# Patient Record
Sex: Female | Born: 1935 | Race: White | Hispanic: No | Smoking: Former smoker
Health system: Southern US, Community
[De-identification: ages and names within clinical notes are randomized; demographics above are authoritative.]

## PROBLEM LIST (undated history)

## (undated) DIAGNOSIS — H269 Unspecified cataract: Secondary | ICD-10-CM

## (undated) DIAGNOSIS — I4891 Unspecified atrial fibrillation: Secondary | ICD-10-CM

## (undated) DIAGNOSIS — E039 Hypothyroidism, unspecified: Secondary | ICD-10-CM

## (undated) DIAGNOSIS — J439 Emphysema, unspecified: Secondary | ICD-10-CM

## (undated) DIAGNOSIS — J449 Chronic obstructive pulmonary disease, unspecified: Secondary | ICD-10-CM

## (undated) DIAGNOSIS — M1990 Unspecified osteoarthritis, unspecified site: Secondary | ICD-10-CM

## (undated) DIAGNOSIS — K219 Gastro-esophageal reflux disease without esophagitis: Secondary | ICD-10-CM

## (undated) DIAGNOSIS — I10 Essential (primary) hypertension: Secondary | ICD-10-CM

## (undated) DIAGNOSIS — E119 Type 2 diabetes mellitus without complications: Secondary | ICD-10-CM

## (undated) DIAGNOSIS — N189 Chronic kidney disease, unspecified: Secondary | ICD-10-CM

## (undated) HISTORY — PX: CHOLECYSTECTOMY: SHX55

## (undated) HISTORY — PX: GALLBLADDER SURGERY: SHX652

---

## 1998-05-29 ENCOUNTER — Ambulatory Visit (HOSPITAL_COMMUNITY): Admission: RE | Admit: 1998-05-29 | Discharge: 1998-05-29 | Payer: Self-pay | Admitting: Cardiovascular Disease

## 2005-05-05 ENCOUNTER — Ambulatory Visit: Payer: Self-pay | Admitting: Unknown Physician Specialty

## 2005-09-13 ENCOUNTER — Ambulatory Visit: Payer: Self-pay | Admitting: Internal Medicine

## 2005-11-09 ENCOUNTER — Ambulatory Visit: Payer: Self-pay | Admitting: Gastroenterology

## 2006-10-04 ENCOUNTER — Ambulatory Visit: Payer: Self-pay | Admitting: Internal Medicine

## 2007-02-13 ENCOUNTER — Ambulatory Visit: Payer: Self-pay

## 2007-08-22 ENCOUNTER — Ambulatory Visit: Payer: Self-pay | Admitting: Gastroenterology

## 2007-11-14 ENCOUNTER — Ambulatory Visit: Payer: Self-pay | Admitting: Internal Medicine

## 2008-11-18 ENCOUNTER — Ambulatory Visit: Payer: Self-pay | Admitting: Internal Medicine

## 2009-09-24 ENCOUNTER — Emergency Department: Payer: Self-pay | Admitting: Emergency Medicine

## 2009-12-26 ENCOUNTER — Ambulatory Visit: Payer: Self-pay | Admitting: Internal Medicine

## 2010-03-29 ENCOUNTER — Ambulatory Visit: Payer: Self-pay | Admitting: Neurology

## 2010-04-27 ENCOUNTER — Ambulatory Visit: Payer: Self-pay | Admitting: Internal Medicine

## 2011-01-17 ENCOUNTER — Ambulatory Visit: Payer: Self-pay | Admitting: Rheumatology

## 2011-06-30 ENCOUNTER — Emergency Department: Payer: Self-pay | Admitting: *Deleted

## 2011-06-30 ENCOUNTER — Ambulatory Visit: Payer: Self-pay | Admitting: Internal Medicine

## 2011-07-31 ENCOUNTER — Emergency Department: Payer: Self-pay | Admitting: Emergency Medicine

## 2012-01-07 IMAGING — CR DG CHEST 1V
1 series · 1 of 1 positions shown · non-contrast
Comparison: none

REASON FOR EXAM: ABD PAIN
COMMENTS:   May transport without cardiac monitor

PROCEDURE:     DXR - DXR CHEST 1 VIEWAP OR PA  - July 31, 2011 [DATE]
RESULT:     The lungs are clear but hyperinflated. The heart and pulmonary
vessels are normal. The bony and mediastinal structures unremarkable. Mild
apical fibrosis is present bilaterally.

[view not recorded]
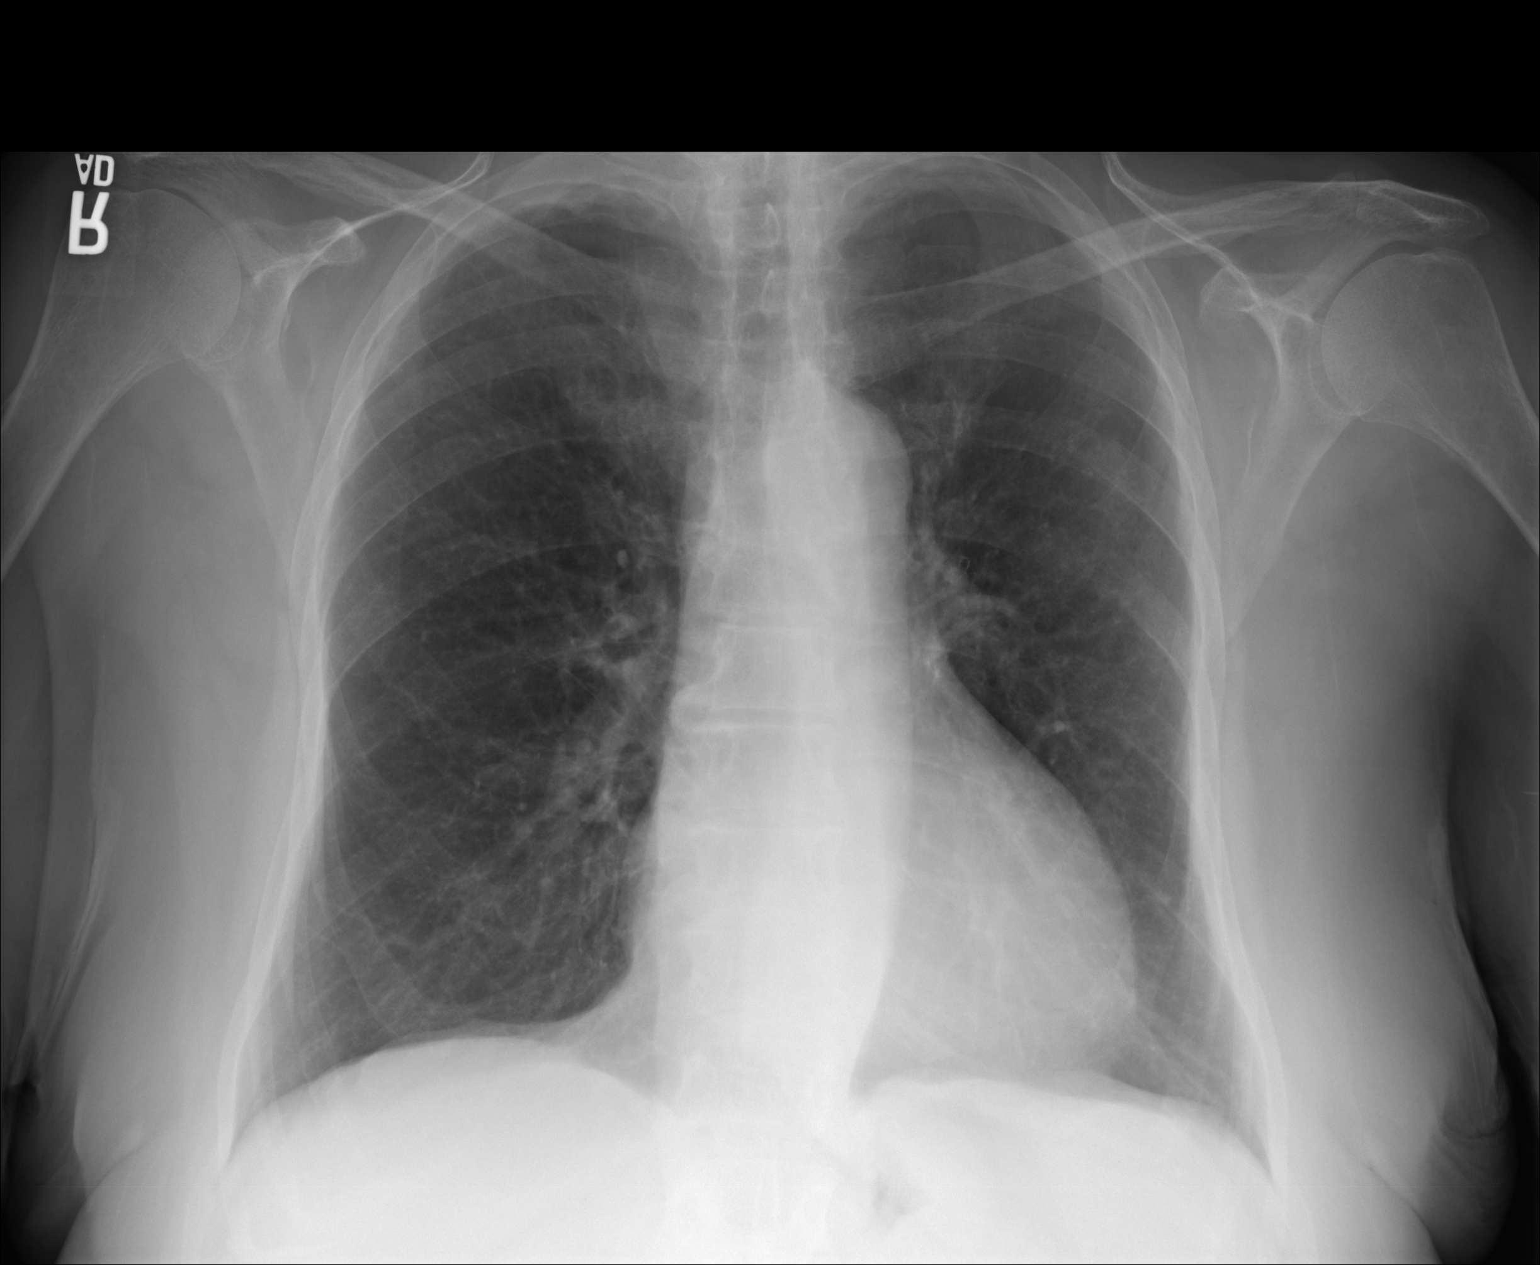

[1 of 1 positions shown; findings below may reference images not displayed]

IMPRESSION: COPD suggested.

## 2012-07-04 ENCOUNTER — Ambulatory Visit: Payer: Self-pay | Admitting: Internal Medicine

## 2013-05-06 ENCOUNTER — Ambulatory Visit: Payer: Self-pay | Admitting: Unknown Physician Specialty

## 2013-11-04 LAB — CBC WITH DIFFERENTIAL/PLATELET
Basophil #: 0 10*3/uL (ref 0.0–0.1)
Basophil %: 0.4 %
Eosinophil #: 0 10*3/uL (ref 0.0–0.7)
Eosinophil %: 0.4 %
HCT: 36.6 % (ref 35.0–47.0)
HGB: 12.2 g/dL (ref 12.0–16.0)
Lymphocyte #: 1.1 10*3/uL (ref 1.0–3.6)
Lymphocyte %: 16.2 %
MCH: 29.2 pg (ref 26.0–34.0)
MCHC: 33.4 g/dL (ref 32.0–36.0)
MCV: 88 fL (ref 80–100)
Monocyte #: 0.8 x10 3/mm (ref 0.2–0.9)
Monocyte %: 12.2 %
Neutrophil #: 4.9 10*3/uL (ref 1.4–6.5)
Neutrophil %: 70.8 %
Platelet: 191 10*3/uL (ref 150–440)
RBC: 4.18 10*6/uL (ref 3.80–5.20)
RDW: 14.4 % (ref 11.5–14.5)
WBC: 6.9 10*3/uL (ref 3.6–11.0)

## 2013-11-04 LAB — URINALYSIS, COMPLETE
Bacteria: NONE SEEN
Bilirubin,UR: NEGATIVE
Blood: NEGATIVE
Glucose,UR: NEGATIVE mg/dL (ref 0–75)
Hyaline Cast: 3
Ketone: NEGATIVE
Leukocyte Esterase: NEGATIVE
Nitrite: NEGATIVE
Ph: 5 (ref 4.5–8.0)
Protein: NEGATIVE
RBC,UR: <1 /HPF (ref 0–5)
Specific Gravity: 1.015 (ref 1.003–1.030)
Squamous Epithelial: 1
WBC UR: <1 /HPF (ref 0–5)

## 2013-11-04 LAB — TROPONIN I
Troponin-I: <0.02 ng/mL
Troponin-I: <0.02 ng/mL
Troponin-I: <0.02 ng/mL

## 2013-11-04 LAB — LIPASE, BLOOD: Lipase: 92 U/L (ref 73–393)

## 2013-11-04 LAB — COMPREHENSIVE METABOLIC PANEL
Albumin: 3.1 g/dL — ABNORMAL LOW (ref 3.4–5.0)
Alkaline Phosphatase: 110 U/L
Anion Gap: 6 — ABNORMAL LOW (ref 7–16)
BUN: 17 mg/dL (ref 7–18)
Bilirubin,Total: 1.3 mg/dL — ABNORMAL HIGH (ref 0.2–1.0)
Calcium, Total: 9 mg/dL (ref 8.5–10.1)
Chloride: 107 mmol/L (ref 98–107)
Co2: 25 mmol/L (ref 21–32)
Creatinine: 1.67 mg/dL — ABNORMAL HIGH (ref 0.60–1.30)
EGFR (African American): 33 — ABNORMAL LOW
EGFR (Non-African Amer.): 28 — ABNORMAL LOW
Glucose: 104 mg/dL — ABNORMAL HIGH (ref 65–99)
Osmolality: 278 (ref 275–301)
Potassium: 3.7 mmol/L (ref 3.5–5.1)
SGOT(AST): 192 U/L — ABNORMAL HIGH (ref 15–37)
SGPT (ALT): 139 U/L — ABNORMAL HIGH (ref 12–78)
Sodium: 138 mmol/L (ref 136–145)
Total Protein: 6.7 g/dL (ref 6.4–8.2)

## 2013-11-04 LAB — CK TOTAL AND CKMB (NOT AT ARMC)
CK, Total: 626 U/L — ABNORMAL HIGH (ref 21–215)
CK-MB: 1.9 ng/mL (ref 0.5–3.6)

## 2013-11-04 LAB — CK-MB
CK-MB: 2.4 ng/mL (ref 0.5–3.6)
CK-MB: 3 ng/mL (ref 0.5–3.6)

## 2013-11-04 LAB — PRO B NATRIURETIC PEPTIDE: B-Type Natriuretic Peptide: 1324 pg/mL — ABNORMAL HIGH (ref 0–450)

## 2013-11-05 ENCOUNTER — Inpatient Hospital Stay: Payer: Self-pay | Admitting: Internal Medicine

## 2013-11-05 LAB — CBC WITH DIFFERENTIAL/PLATELET
Basophil #: 0 10*3/uL (ref 0.0–0.1)
Basophil %: 0.1 %
Eosinophil #: 0 10*3/uL (ref 0.0–0.7)
Eosinophil %: 0 %
HCT: 34.5 % — ABNORMAL LOW (ref 35.0–47.0)
HGB: 11.5 g/dL — ABNORMAL LOW (ref 12.0–16.0)
Lymphocyte #: 0.5 10*3/uL — ABNORMAL LOW (ref 1.0–3.6)
Lymphocyte %: 8.2 %
MCH: 29.2 pg (ref 26.0–34.0)
MCHC: 33.4 g/dL (ref 32.0–36.0)
MCV: 87 fL (ref 80–100)
Monocyte #: 0.2 x10 3/mm (ref 0.2–0.9)
Monocyte %: 2.6 %
Neutrophil #: 5.7 10*3/uL (ref 1.4–6.5)
Neutrophil %: 89.1 %
Platelet: 186 10*3/uL (ref 150–440)
RBC: 3.95 10*6/uL (ref 3.80–5.20)
RDW: 14.2 % (ref 11.5–14.5)
WBC: 6.3 10*3/uL (ref 3.6–11.0)

## 2013-11-05 LAB — PROTIME-INR
INR: 1.1
Prothrombin Time: 14.5 secs (ref 11.5–14.7)

## 2013-11-05 LAB — COMPREHENSIVE METABOLIC PANEL
Albumin: 2.8 g/dL — ABNORMAL LOW (ref 3.4–5.0)
Alkaline Phosphatase: 108 U/L
Anion Gap: 7 (ref 7–16)
BUN: 33 mg/dL — ABNORMAL HIGH (ref 7–18)
Bilirubin,Total: 0.6 mg/dL (ref 0.2–1.0)
Calcium, Total: 9.1 mg/dL (ref 8.5–10.1)
Chloride: 102 mmol/L (ref 98–107)
Co2: 25 mmol/L (ref 21–32)
Creatinine: 2.35 mg/dL — ABNORMAL HIGH (ref 0.60–1.30)
EGFR (African American): 22 — ABNORMAL LOW
EGFR (Non-African Amer.): 19 — ABNORMAL LOW
Glucose: 204 mg/dL — ABNORMAL HIGH (ref 65–99)
Osmolality: 281 (ref 275–301)
Potassium: 3.7 mmol/L (ref 3.5–5.1)
SGOT(AST): 106 U/L — ABNORMAL HIGH (ref 15–37)
SGPT (ALT): 104 U/L — ABNORMAL HIGH (ref 12–78)
Sodium: 134 mmol/L — ABNORMAL LOW (ref 136–145)
Total Protein: 6.4 g/dL (ref 6.4–8.2)

## 2013-11-06 LAB — COMPREHENSIVE METABOLIC PANEL
Anion Gap: 8 (ref 7–16)
BUN: 50 mg/dL — ABNORMAL HIGH (ref 7–18)
Calcium, Total: 8.6 mg/dL (ref 8.5–10.1)
Chloride: 103 mmol/L (ref 98–107)
Co2: 23 mmol/L (ref 21–32)
Creatinine: 2.46 mg/dL — ABNORMAL HIGH (ref 0.60–1.30)
EGFR (African American): 21 — ABNORMAL LOW
EGFR (Non-African Amer.): 18 — ABNORMAL LOW
Glucose: 114 mg/dL — ABNORMAL HIGH (ref 65–99)
Osmolality: 282 (ref 275–301)
Potassium: 3.7 mmol/L (ref 3.5–5.1)
Sodium: 134 mmol/L — ABNORMAL LOW (ref 136–145)

## 2013-11-06 LAB — HEPATIC FUNCTION PANEL A (ARMC)
Albumin: 2.6 g/dL — ABNORMAL LOW (ref 3.4–5.0)
Alkaline Phosphatase: 102 U/L
Bilirubin, Direct: 0.2 mg/dL (ref 0.00–0.20)
Bilirubin,Total: 0.4 mg/dL (ref 0.2–1.0)
SGOT(AST): 56 U/L — ABNORMAL HIGH (ref 15–37)
SGPT (ALT): 74 U/L (ref 12–78)
Total Protein: 6 g/dL — ABNORMAL LOW (ref 6.4–8.2)

## 2013-11-06 LAB — AFP TUMOR MARKER: AFP-Tumor Marker: 3.1 ng/mL (ref 0.0–8.3)

## 2013-11-07 LAB — COMPREHENSIVE METABOLIC PANEL
Albumin: 2.4 g/dL — ABNORMAL LOW (ref 3.4–5.0)
Alkaline Phosphatase: 84 U/L
Anion Gap: 5 — ABNORMAL LOW (ref 7–16)
BUN: 44 mg/dL — ABNORMAL HIGH (ref 7–18)
Bilirubin,Total: 0.3 mg/dL (ref 0.2–1.0)
Calcium, Total: 8.4 mg/dL — ABNORMAL LOW (ref 8.5–10.1)
Chloride: 111 mmol/L — ABNORMAL HIGH (ref 98–107)
Co2: 25 mmol/L (ref 21–32)
Creatinine: 1.81 mg/dL — ABNORMAL HIGH (ref 0.60–1.30)
EGFR (African American): 30 — ABNORMAL LOW
EGFR (Non-African Amer.): 26 — ABNORMAL LOW
Glucose: 82 mg/dL (ref 65–99)
Osmolality: 292 (ref 275–301)
Potassium: 4 mmol/L (ref 3.5–5.1)
SGOT(AST): 46 U/L — ABNORMAL HIGH (ref 15–37)
SGPT (ALT): 59 U/L (ref 12–78)
Sodium: 141 mmol/L (ref 136–145)
Total Protein: 5.6 g/dL — ABNORMAL LOW (ref 6.4–8.2)

## 2013-11-09 LAB — CULTURE, BLOOD (SINGLE)

## 2015-02-14 NOTE — Consult Note (Signed)
Brief Consult Note: Diagnosis: transaminitis, abnormal biliary ducts on imaging.   Patient was seen by consultant.   Consult note dictated.   Orders entered.   Discussed with Attending MD.   Comments: Patient seen and evaluated. [place] and CT reviewed. labs reviewed. She has persistent transaminitis with mild nodular contour of the liver. She denies significant alcohol use and has no prior history of cirrhosis or hepatitis that she is aware of. She does report several years ago being told she had an "abnormal liver" but does not remember any of the details. She continues to have RUQ and LUQ abdominal pain, without nausea/vomiting/dysphagia/indigestion. Decreased appetite. She has been constipated recently and explains shes stopped responding to her outpatient laxative.  Abdominal pain may be related to constipation; agree with bowel regimen. colace and miralax. Biliary dilation may be secondary to post-cholecystectomy state vs underlying liver disease. no clear stone or mass on CT.  Check serial LFTs to monitor trend. Will order MRCP to eval biliary tree. Check hepatitis a/b/c, alpha-1-antitripsin levels, PT/INR, AFP to eval and work-up possible underlying cirrhosis. She may benefit from an outpatient EGD as well.  Due for outpatient colonoscopy for fhx of colon cancer and personal history of colon polyps. She has an appt scheduled already with Dr Vira Agar 12/08/13.  Will follow closely. full consult being dictated..  Electronic Signatures: Sherald Barge (PA-C)  (Signed 13-Jan-15 15:11)  Authored: Brief Consult Note   Last Updated: 13-Jan-15 15:11 by Sherald Barge (PA-C)

## 2015-02-14 NOTE — H&P (Signed)
PATIENT NAME:  Denise [name], Denise [name] MR#:  [id number] DATE OF BIRTH:  [birth year]  DATE OF ADMISSION:  11/04/2013  PRIMARY CARE PHYSICIAN:  Perrin Maltese, MD   HISTORY OF PRESENT ILLNESS:  The patient is an 81 year old Caucasian female with past medical history significant for history of COPD, emphysema, ongoing tobacco abuse, history of leaking valve, diabetes mellitus, hypertension, hyperlipidemia, gastroesophageal reflux disease, who presents to the hospital with complaints of not feeling well. Apparently, the patient has been having problems with cough as well as shortness of breath for the past 1 week. She is also admitting of some chest tightness. She was called Dr. Lamonte Sakai, who wanted the patient to have prednisone taper, however, the patient refused due to concerns of hypoglycemia. On arrival to the Emergency Room, however, she was noted to be relatively hypoxic with O2 sats of 90% on room air. She also was complaining of right upper quadrant as well as left upper quadrant abdominal pain. She stated that she has been not having good bowel movements for the past 3 days. In regards to right upper quadrant, she has been having this pain for the past few months. She has been also losing weight. She stated that she lost at least 70 pounds over the past 3 years. She states that she has been having problems with poor taste. She does not remember how long she has been having this right upper quadrant discomfort but in the Emergency Room she was noted to have elevated LFTs and ultrasound of her abdomen was performed. Ultrasound revealed intrahepatic ductal dilatation and hospitalist services were contacted for admission.   PAST MEDICAL HISTORY: Significant for history of COPD, emphysema, history of heart valves problems for leaking valves according to the patient, diabetes mellitus, COPD, no oxygen at home, hypertension, hyperlipidemia, smoker, still is 3 or 4 cigarettes a day, gastroesophageal reflux disease,  chronic renal failure with estimated GFR of 26 according to the patient.   MEDICATIONS: The patient is on:  1.  Atorvastatin 40 mg p.o. daily.  2.  Breo  Ellipta 100/25 one puff daily.  3.  Cheratussin a.c. 10/100 in 5 mL oral syrup 5 to 10 mL every 8 hours as needed.  4.  Clotrimazole topical cream 1% twice daily to affected area.  5.  Fenofibrate 145 mg p.o. daily.  6.  Iron sulfate 325 mg per day.  7.  Glimepiride  0.5 mg p.o. daily.  8.  Levothyroxine 125 mcg p.o. daily.  9.  Nystatin topical powder 1 application 3 times daily to affected area.  10.  Omeprazole 40 mg p.o. twice daily.  11.  ProAir HFA 2 puffs 4 times daily as needed.  12.  Losartan 80 mg per day.  13.  Verapamil extended-release 240 mg p.o. daily.   PAST SURGICAL HISTORY: Gallbladder surgery as well as D and C.   ALLERGIES: TESSALON PERLES AS WELL AS SOME MYCINS, SHE IS NOT SURE WHAT KIND.   FAMILY HISTORY: Multiple medical problems. Brother had colon cancer as well as prostate cancer and Parkinson's disease. Two sisters, 1 died of colon cancer and 1 died of MI. Patient's  other brother died of MI at a young age. The patient's mother died of diabetes complications. She had MI at the age of 39. The patient's father died of colon cancer and stomach cancer. The patient's son died of some kind of accident, apparently he had pancreatic abnormality found and he was supposed to have an operation, however, died prior to  operation.   SOCIAL HISTORY: The patient is widowed. She had 3 husbands total. She had 2 children, 1 son died as mentioned above because of some pancreatic abnormality. The patient's daughter lives in [place] but they are not very close. She is a smoker, the patient has been smoking for a long years now, she has cut down; however to 3 or 4 cigarettes a day. No alcohol abuse. She used to work in multiple companies in [employer], also with [employer] but now retired.   REVIEW OF SYSTEMS: Positive  for fatigue and weakness which seems to be chronic, pain in the abdomen and right upper quadrant as well as left upper quadrant, weight loss from 259 to 189 in 3 years, poor taste. Also small cataracts but she did not require operation. Some postnasal drip, cough as well as shortness of breath. Nagging achy pain in the right upper quadrant. Some intermittent chest pains whenever she coughs for a long period of time as well as feeling presyncopal whenever she coughs for a long period of time. Also admitting to palpitations in her chest and intermittent abdominal pains as mentioned above. Also constipation for the past 3 days, no bowel movement. Admits of having some neck pains as well as back pains and knee pains, more on the right side. Denies any high fevers or chills, weight gain.  EYES: Denies any blurry vision, double vision or glaucoma.  ENT: Denies any tinnitus, allergies, epistaxis, sinus pain, dentures, difficulty swallowing.  RESPIRATORY: Denies any wheezes, asthma, COPD.  CARDIOVASCULAR: Denies any orthopnea or edema.  GASTROINTESTINAL:  Denies any nausea, vomiting, diarrhea, rectal bleeding, change in bowel habits.  GENITOURINARY: Denies dysuria, hematuria, frequency, incontinence.  ENDOCRINE: Denies any polydipsia, nocturia, thyroid problems, heat or cold intolerance or thirst.  HEMATOLOGIC: Denies anemia, easy bruising, or swollen glands.  SKIN: Denies any acne, rashes or change in moles. MUSCULOSKELETAL: Denies arthritis, cramps, swelling. NEUROLOGIC:  Denies any numbness, epilepsy or tremor. PSYCHIATRIC: Denies anxiety, insomnia or depression.   PHYSICAL EXAMINATION: VITAL SIGNS: On arrival to the hospital, the patient's temperature is 97.8, pulse was 67, respiratory rate was 18, blood pressure 140/102, O2 sat was 90% on room air.  GENERAL: This is a well-developed, obese Caucasian female in mild distress whenever she coughs paroxysmally for prolonged period of time.  HEENT: Her  pupils are equal and reactive to light. Extraocular movements intact.  No icterus or conjunctivitis.  Has normal hearing. No pharyngeal erythema. Mucosa is dry.  NECK:  No masses, supple, nontender.  They not enlarged. No adenopathy.  No JVD or carotid bruits bilaterally. Full range of motion.  LUNGS: Clear ti auscultation but diminished breath sounds posteriorly, a few rhonchi as well as rales were heard anteriorly as well as a few wheezes. The patient has minimal wheezing; however, she has labored inspirations as well as increased effort to breathe whenever she moves around. She is in mild respiratory distress. She is in moderate respiratory distress whenever she coughs for a long period of time.  CARDIOVASCULAR: S1, S2 present, no murmurs, rubs or gallops heard.  Rhythm is regular.  PMI not lateralized.  Chest is nontender to palpation.  Pedal pulses 1+.  No lower extremity edema, calf tenderness, clubbing or cyanosis was noted.  ABDOMEN: Soft, minimally tender in the right upper quadrant but no rebound or guarding were noted. Bowel sounds are present but diminished.  MUSCULOSKELETAL:  Able to move all extremities. No cyanosis, degenerative joint disease or kyphosis. Gait is not  tested.  SKIN: No rashes, lesions, erythema, nodularity or induration. Candidal skin inflammation was noted under the pannus as well as under the breasts, seemed to be better healed under the pannus.  LYMPHATIC: No adenopathy in the cervical region.  NEUROLOGICAL: Cranial nerves grossly intact. Sensory is intact. No dysarthria or aphasia. The patient is alert and oriented to time, person, place, cooperative. Memory is good.  PSYCHIATRIC: No significant confusion, agitation or depression noted.   LABORATORY, DIAGNOSTIC AND RADIOLOGICAL DATA:  EKG showed normal sinus rhythm at 68 beats per minute, normal axis, no significant EKG changes were noted, nonspecific EKG changes only. BMP showed glucose was 104.  Beta-type natriuretic  peptide was 1324. Creatinine 1.67, bicarbonate level is 25, estimated GFR for non-African American would be 28. The patient's liver enzymes: Albumin level of 3.1, total bilirubin 1.3, AST as well as ALT is 192 and 139 respectively. CK elevation of 226, MB fraction 1.9 and troponin was less than 0.02. White blood cell count is normal at 6.9, hemoglobin was 12.2, platelet count 191, absolute neutrophil count is normal at 4.9. Urinalysis: Yellow clear urine, negative for glucose, bilirubin or ketones; specific gravity 1.015, pH was 5.0, negative for blood, protein, nitrites or leukocyte esterase; less than 1 red blood cell as well as white blood cell, no bacteria, 1 epithelial cell, mucus was present as well as 3 hyaline casts. Radiologic studies: Chest x-ray, PA and lateral, 12th of January 2015, showed stable hyperinflation suggesting emphysema, no acute cardiopulmonary findings were noted. Ultrasound of abdomen, limited survey, 12th of January 2015, revealed intrahepatic as well as common bile duct dilatation, mild common duct dilatation was demonstrated on a noncontrast CT scan in December 2010 but at that time patient had mp intrahepatic ductal dilatation. This could be related to prior cholecystectomy; however, due to elevation of hepatic function tests as well as presence of abdominal pain, it could indicate underlying pathology according to radiologist. Following CT scanning of abdomen and pelvis with contrast was recommended to evaluate visceral structures including pancreas.   ASSESSMENT AND PLAN: 1.  Chronic obstructive pulmonary disease exacerbation due to acute bronchitis. Admit the patient to the medical floor. Continue antibiotic therapy. I started her on Advair as well as DuoNebs and prednisone taper, Tussionex. Continue oxygen therapy as needed and get sputum cultures if possible.  2.  Left upper quadrant abdominal pain with weight loss, elevated LFTs as well as abnormal ultrasound of abdomen with  intrahepatic ductal dilatation.  Get a gastroenterologist involved, get CT scan of abdomen and pelvis with oral contrast only. Unfortunately, we cannot use IV contrast due to renal insufficiency. The patient may benefit from magnetic resonance cholangiopancreatography at some point if CT of abdomen and pelvis is not informative.  3.  Chronic renal failure, seems to be stable. We will continue her on Lasix.  4.  Hypertension. We will continue home medications. The patient's blood pressure seemed to be somewhat elevated but it could be stress-related, may need to be advancement, will follow blood pressure readings closely.  5.  Tobacco abuse. Discussed cessation for approximately 5 minutes. Nicotine replacement therapy will be initiated. The patient was agreeable.  6.  Constipation. Initiate Colace, Senna as well as MiraLax.   TIME SPENT:  One hour.     ____________________________ Theodoro Grist, MD rv:cs D: 11/04/2013 13:39:00 ET T: 11/04/2013 14:17:54 ET JOB#: [id number]  cc: Perrin Maltese, MD Theodoro Grist, MD, <Dictator>  [place] MD ELECTRONICALLY SIGNED 11/05/2013 19:10

## 2015-02-14 NOTE — Consult Note (Signed)
PATIENT NAME:  Denise [name], CRASS MR#:  [id number] DATE OF BIRTH:  November 08, 1929  DATE OF CONSULTATION:  11/05/2013  REFERRING PHYSICIAN: Theodoro Grist, MD CONSULTING PROVIDER:  Corky Sox. Zettie Pho, PA-C ATTENDING GASTROENTEROLOGIST: Arther Dames, MD  REASON FOR CONSULTATION: Dilated bile duct and elevated liver function tests.   HISTORY OF PRESENT ILLNESS: This is a pleasant 81 year old female who initially presented to the Emergency Department for just generally not feeling well who had been having some cough and shortness of breath. She does have a history of COPD with ongoing tobacco use, and she was found to be relatively hypoxic in the ER with a pulse ox of 90%. She also reported ongoing upper abdominal pain, mainly on the right side, for a few months now with some recent constipation.   Upon further work-up, she did undergo an abdominal ultrasound that noticed some abnormal biliary dilation and she did have some transaminitis. For this reason, she was admitted to the hospital. Transaminases initially were found to be AST 192, ALT 139. Bilirubin and alk phos  have remained normal.   Today these transaminases have trended down slightly to AST 106, ALT 104. Troponins were negative but BNP was markedly elevated. The patient did not have a white count.   Ultrasound of the abdomen reveals postcholecystectomy state but a dilated common bile duct with intrahepatic and extrahepatic dilation. There were also signs of hepatomegaly without any discrete hepatic mass.   Followup imaging was recommended and therefore she underwent a CT scan that revealed prominent intrahepatic and extrahepatic dilation but no evidence of a common bile duct stone or pancreatic mass. Liver measured at 17.8 cm which is borderline enlarged and there was a mild lobulation noted in the contour of the liver suggestive of possible early cirrhosis.   Of note, the patient's platelets are 186 and the patient does have a history of chronic  kidney disease. She denies any prior history of cirrhosis or hepatitis that she has been diagnosed with, but she does state that several years ago, someone once told her that she had a "abnormal liver." She cannot recall any of the details regarding this.   She reports for the past 3 to 4 days being constipated, having quite a difficult time passing stool. She does take a laxative at home but states that for the past 2 weeks she feels she has stopped responding to this. She does not have much of an appetite, but she denies any nausea, vomiting or dysphagia. Gradual weight loss over the past 3 years of about 60 to 70 pounds per the patient.   We have seen her in the office as an outpatient for screening colonoscopies. She has a personal history of colon polyps and multiple first-degree family members who have passed away of colon cancer. Her last colonoscopy was in 2006 and she was due for a repeat in 2011 but did not undergo this procedure due to some other health issues ongoing at that time.   She actually does have a follow-up appointment already scheduled with Dr. Vira Agar as an outpatient in February 2015 to readdress this issue.   Last EGD was in 1995.   No ongoing chest pain and shortness of breath since admission. No black or bloody stools. No other symptomatic concerns or complaints.   PAST MEDICAL HISTORY: COPD, chronic kidney disease, GERD, dyslipidemia, diabetes mellitus, hypertension, valvular heart disease, ongoing tobacco use, colon polyps.   HOME MEDICATIONS: Atorvastatin, Cheratussin, verapamil, nystatin, levothyroxine, losartan, phenyl fibrate, clotrimazole, iron sulfate,  glimepiride, ProAir, levothyroxine.   PAST SURGICAL HISTORY: Cholecystectomy and D and C.   ALLERGIES: TESSALON PERLES.   SOCIAL HISTORY: The patient does report ongoing tobacco use, but she has been trying to cut back, particularly because of her coexisting COPD. She denies any significant alcohol or illicit  drug use.   FAMILY HISTORY: Dad, brother, and sister have all had colon cancer. Brother with prostate cancer. Father with stomach cancer. She also reports multiple family members have had MIs.   REVIEW OF SYSTEMS: A 10-system review of systems was obtained on the patient. Pertinent positives are mentioned above and otherwise negative.   OBJECTIVE:  VITAL SIGNS: Blood pressure 131/66, heart rate 71, respirations 18, temperature 97.8, bedside pulse oximetry 93%.  GENERAL: A pleasant 81 year old female resting quietly and comfortably in bed in no acute distress. Alert and oriented x3.  HEAD: Atraumatic, normocephalic.  NECK: Supple. No lymphadenopathy noted.  HEENT: Sclerae anicteric. Mucous membranes moist.  LUNGS: Respirations are even and unlabored. Quiet bilateral wheezes are noted throughout.  CARDIAC: Regular rate and rhythm. S1, S2 noted.  ABDOMEN: Soft, nontender, nondistended. Normoactive bowel sounds noted in all four quadrants. No guarding or rebound. No masses, hernias or organomegaly appreciated but exam is significantly limited secondary to obese habitus. Negative Murphy sign.  RECTAL: Deferred.  PSYCHIATRIC: Appropriate mood and affect.  NEUROLOGIC: Cranial nerves II through XII are grossly intact.  EXTREMITIES: Negative for lower extremity edema, 2+ pulses noted in bilateral upper extremities.   LABORATORY DATA: White blood cells 6.3, hemoglobin 11.5, hematocrit 34.5, platelets 186. Sodium 134, potassium 3.7, BUN 33, creatinine 2.35, glucose 204, bilirubin 0.6, AST 106, ALT 104, alk phos 108. BNP is 1324. Lipase 92. Troponins negative. MCV 87.   IMAGING: Ultrasound of the abdomen was ordered on the patient showing a postcholecystectomy state. Common bile duct 20 mm. No evidence of hepatic mass. There is borderline hepatomegaly.   CT of the abdomen and pelvis without IV contrast but with oral contrast was obtained on the patient. This reveals prominent intrahepatic and  extrahepatic bile ducts when compared to a prior image. No evidence of a common bile duct stone or pancreatic mass. Cholecystectomy is noted. Liver measures 17.8 cm with minimally lobulated contour suggestive of possible early cirrhosis. There is also prominent stool in the rectosigmoid colon.   ASSESSMENT:  1.  Abnormal CT and ultrasound noting intrahepatic and extrahepatic biliary dilation. No clear common bile duct stone or pancreatic mass but there does seem to be a minimally lobulated  contour of the liver suggestive of possible early cirrhosis.  2.  Transaminitis. Since discharge, these levels have slightly improved to today but are still elevated over 100  3.  Right upper quadrant abdominal pain.  4.  Constipation.  5.  Personal history of colon polyps.  6.  Family history of colon cancer in brother, sister, and father.  7.  Chronic kidney disease.  8.  Chronic obstructive pulmonary disease with ongoing tobacco use.   PLAN: I have discussed this patient's case in detail with Dr. Arther Dames who is involved in the development of the patient's plan of care. Due to her persistent transaminitis with abnormal findings on ultrasound and CT scan, we do recommend checking serial liver enzymes to monitor the trend. We will also order an MRCP. Because there is some question of underlying cirrhosis, we would also like to check a hepatitis panel A, B, and C; PT/INR, alpha-1 antitrypsin, alpha-fetoprotein, to evaluate for possible underlying cirrhosis as well.  Certainly her abdominal pain could be in part contributing from her constipation, and therefore we do agree with continuing a bowel regimen with MiraLax and Colace.   She does have an outpatient followup appointment already scheduled with Dr. Vira Agar on 12/03/2013 to evaluate her need for a repeat colonoscopy due to a history of polyps and family history of colon cancer. We do recommend that she keep this appointment.   She may also end up being  a candidate for an outpatient esophagogastroduodenoscopy if her upper abdominal pain persists, or if there is truly a concern for cirrhosis, we can evaluate for varices.   This was explained to the patient and all questions were answered. We will continue to monitor this patient closely throughout hospitalization and make further recommendations pending above. All questions were answered.   Thank you so much for this consultation and for allowing [place] to participate in the patient's plan of care.   These services provided by Loren Racer, NP, under collaborative agreement with Dr. Arther Dames.    ____________________________ Corky Sox. Earle, PA-C kme:np D: 11/05/2013 14:38:07 ET T: 11/05/2013 15:00:30 ET JOB#: [id number]  cc: Corky Sox. Earle, PA-C, <Dictator> [place] PA ELECTRONICALLY SIGNED 11/05/2013 16:13

## 2015-04-22 ENCOUNTER — Other Ambulatory Visit: Payer: Self-pay | Admitting: Internal Medicine

## 2015-04-22 DIAGNOSIS — M545 Low back pain, unspecified: Secondary | ICD-10-CM

## 2015-04-22 DIAGNOSIS — G8929 Other chronic pain: Secondary | ICD-10-CM

## 2015-04-29 ENCOUNTER — Ambulatory Visit: Payer: Medicare Other

## 2015-05-05 ENCOUNTER — Ambulatory Visit: Payer: Medicare Other

## 2015-06-12 ENCOUNTER — Other Ambulatory Visit: Payer: Self-pay | Admitting: Internal Medicine

## 2015-06-12 DIAGNOSIS — M545 Low back pain, unspecified: Secondary | ICD-10-CM

## 2015-06-12 DIAGNOSIS — G8929 Other chronic pain: Secondary | ICD-10-CM

## 2015-07-21 ENCOUNTER — Emergency Department: Payer: Medicare Other

## 2015-07-21 ENCOUNTER — Emergency Department
Admission: EM | Admit: 2015-07-21 | Discharge: 2015-07-21 | Disposition: A | Discharge status: Home / Self Care | Payer: Medicare Other | Attending: Emergency Medicine | Admitting: Emergency Medicine

## 2015-07-21 DIAGNOSIS — M1610 Unilateral primary osteoarthritis, unspecified hip: Secondary | ICD-10-CM | POA: Diagnosis not present

## 2015-07-21 DIAGNOSIS — M25551 Pain in right hip: Secondary | ICD-10-CM | POA: Diagnosis present

## 2015-07-21 DIAGNOSIS — E119 Type 2 diabetes mellitus without complications: Secondary | ICD-10-CM | POA: Diagnosis not present

## 2015-07-21 DIAGNOSIS — M25552 Pain in left hip: Secondary | ICD-10-CM | POA: Diagnosis not present

## 2015-07-21 DIAGNOSIS — I10 Essential (primary) hypertension: Secondary | ICD-10-CM | POA: Diagnosis not present

## 2015-07-21 DIAGNOSIS — Z87891 Personal history of nicotine dependence: Secondary | ICD-10-CM | POA: Diagnosis not present

## 2015-07-21 HISTORY — DX: Type 2 diabetes mellitus without complications: E11.9

## 2015-07-21 HISTORY — DX: Essential (primary) hypertension: I10

## 2015-07-21 LAB — BASIC METABOLIC PANEL
Anion gap: 6 (ref 5–15)
BUN: 36 mg/dL — ABNORMAL HIGH (ref 6–20)
CO2: 22 mmol/L (ref 22–32)
Calcium: 8.6 mg/dL — ABNORMAL LOW (ref 8.9–10.3)
Chloride: 109 mmol/L (ref 101–111)
Creatinine, Ser: 2.57 mg/dL — ABNORMAL HIGH (ref 0.44–1.00)
GFR calc Af Amer: 19 mL/min — ABNORMAL LOW (ref >60)
GFR calc non Af Amer: 16 mL/min — ABNORMAL LOW (ref >60)
Glucose, Bld: 84 mg/dL (ref 65–99)
Potassium: 5 mmol/L (ref 3.5–5.1)
Sodium: 137 mmol/L (ref 135–145)

## 2015-07-21 LAB — CBC WITH DIFFERENTIAL/PLATELET
Basophils Absolute: 0 10*3/uL (ref 0–0.1)
Basophils Relative: 1 %
Eosinophils Absolute: 0.1 10*3/uL (ref 0–0.7)
Eosinophils Relative: 1 %
HCT: 31.7 % — ABNORMAL LOW (ref 35.0–47.0)
Hemoglobin: 10.5 g/dL — ABNORMAL LOW (ref 12.0–16.0)
Lymphocytes Relative: 28 %
Lymphs Abs: 1.6 10*3/uL (ref 1.0–3.6)
MCH: 29.3 pg (ref 26.0–34.0)
MCHC: 33 g/dL (ref 32.0–36.0)
MCV: 88.8 fL (ref 80.0–100.0)
Monocytes Absolute: 0.4 10*3/uL (ref 0.2–0.9)
Monocytes Relative: 7 %
Neutro Abs: 3.7 10*3/uL (ref 1.4–6.5)
Neutrophils Relative %: 63 %
Platelets: 160 10*3/uL (ref 150–440)
RBC: 3.57 MIL/uL — ABNORMAL LOW (ref 3.80–5.20)
RDW: 14.6 % — ABNORMAL HIGH (ref 11.5–14.5)
WBC: 5.9 10*3/uL (ref 3.6–11.0)

## 2015-07-21 MED ORDER — OXYCODONE-ACETAMINOPHEN 5-325 MG PO TABS
1.0000 | ORAL_TABLET | Freq: Once | ORAL | Status: AC
  Administered 2015-07-21: 1 via ORAL
  Filled 2015-07-21: qty 1

## 2015-07-21 MED ORDER — FENTANYL CITRATE (PF) 100 MCG/2ML IJ SOLN
50.0000 ug | Freq: Once | INTRAMUSCULAR | Status: AC
  Administered 2015-07-21: 50 ug via INTRAVENOUS
  Filled 2015-07-21: qty 2

## 2015-07-21 MED ORDER — TRAMADOL HCL 50 MG PO TABS: 50.0000 mg | ORAL_TABLET | Freq: Two times a day (BID) | ORAL | Status: DC | PRN

## 2015-07-21 NOTE — Discharge Instructions (Signed)
Arthritis, Nonspecific °Arthritis is pain, redness, warmth, or puffiness (inflammation) of a joint. The joint may be stiff or hurt when you move it. One or more joints may be affected. There are many types of arthritis. Your doctor may not know what type you have right away. The most common cause of arthritis is wear and tear on the joint (osteoarthritis). °HOME CARE  °· Only take medicine as told by your doctor. °· Rest the joint as much as possible. °· Raise (elevate) your joint if it is puffy. °· Use crutches if the painful joint is in your leg. °· Drink enough fluids to keep your pee (urine) clear or pale yellow. °· Follow your doctor's diet instructions. °· Use cold packs for very bad joint pain for 10 to 15 minutes every hour. Ask your doctor if it is okay for you to use hot packs. °· Exercise as told by your doctor. °· Take a warm shower if you have stiffness in the morning. °· Move your sore joints throughout the day. °GET HELP RIGHT AWAY IF:  °· You have a fever. °· You have very bad joint pain, puffiness, or redness. °· You have many joints that are painful and puffy. °· You are not getting better with treatment. °· You have very bad back pain or leg weakness. °· You cannot control when you poop (bowel movement) or pee (urinate). °· You do not feel better in 24 hours or are getting worse. °· You are having side effects from your medicine. °MAKE SURE YOU:  °· Understand these instructions. °· Will watch your condition. °· Will get help right away if you are not doing well or get worse. °Document Released: 01/04/2010 Document Revised: 04/10/2012 Document Reviewed: 01/04/2010 °[employer]® Patient Information ©2015 [employer]. This information is not intended to replace advice given to you by your health care provider. Make sure you discuss any questions you have with your health care provider. ° °

## 2015-07-21 NOTE — ED Notes (Signed)
Pt came in ED from doctors office. Pt fell over 1 week ago. Pt sts after 2 days, progressive pain started in right knee. Reports pain in low back.

## 2015-07-21 NOTE — ED Provider Notes (Addendum)
[facility] Emergency Department Provider Note  ____________________________________________  Time seen: Approximately 4:57 PM  I have reviewed the triage vital signs and the nursing notes.   HISTORY  Chief Complaint Hip Pain    HPI Denise [name] is a 81 y.o. female presents today because she has hip pain. Patient has a chronic problem with hip pain in both sides. She has had steroid shots for this. She states that the steroid shot" wore off" and now the pain is back. She states it hurts too much to walk. She has not fallen or hurt herself the last week and a half although we can half ago she did suffer a fall. She did not hit her head. She has no numbness and no weakness. Normally she gets around "pretty good" with a walker. She has been on pain medication for this before. She denies any fever chills nausea vomiting she denies any dysuria or urinary frequency. She does have chronic back pain as well. That does not appear to be active today. She has had no incontinence of bowel or bladder. She denies any numbness or weakness.  Past Medical History  Diagnosis Date  . Diabetes mellitus without complication   . Hypertension     There are no active problems to display for this patient.   Past Surgical History  Procedure Laterality Date  . Gallbladder surgery      Current Outpatient Rx  Name  Route  Sig  Dispense  Refill  . traMADol (ULTRAM) 50 MG tablet   Oral   Take 1 tablet (50 mg total) by mouth every 12 (twelve) hours as needed for severe pain.   12 tablet   0     Allergies Review of patient's allergies indicates no known allergies.  No family history on file.  Social History Social History  Substance Use Topics  . Smoking status: Former Research scientist (life sciences)  . Smokeless tobacco: None  . Alcohol Use: No    Review of Systems Constitutional: No fever/chills Eyes: No visual changes. ENT: No sore throat. No stiff neck no neck pain Cardiovascular:  Denies chest pain. Respiratory: Denies shortness of breath. Gastrointestinal:   no vomiting.  No diarrhea.  No constipation. Genitourinary: Negative for dysuria. Musculoskeletal: Negative lower extremity swelling Skin: Negative for rash. Neurological: Negative for headaches, focal weakness or numbness. 10-point ROS otherwise negative.  ____________________________________________   PHYSICAL EXAM:  VITAL SIGNS: ED Triage Vitals  Enc Vitals Group     BP 07/21/15 1312 163/65 mmHg     Pulse Rate 07/21/15 1312 57     Resp --      Temp 07/21/15 1312 97.8 F (36.6 C)     Temp Source 07/21/15 1312 Oral     SpO2 07/21/15 1312 100 %     Weight 07/21/15 1312 190 lb (86.183 kg)     Height 07/21/15 1312 5\' 6"  (1.676 m)     Head Cir --      Peak Flow --      Pain Score 07/21/15 1313 10     Pain Loc --      Pain Edu? --      Excl. in [place]? --     Constitutional: Alert and oriented. Well appearing and in no acute distress. Eyes: Conjunctivae are normal. PERRL. EOMI. Head: Atraumatic. Nose: No congestion/rhinnorhea. Mouth/Throat: Mucous membranes are moist.  Oropharynx non-erythematous. Neck: No stridor.   Nontender with no meningismus Cardiovascular: Normal rate, regular rhythm. Grossly normal heart sounds.  Good  peripheral circulation. Respiratory: Normal respiratory effort.  No retractions. Lungs CTAB. Gastrointestinal: Soft and nontender. No distention. No abdominal bruits. Back:  There is no focal tenderness or step off there is no midline tenderness there are no lesions noted. there is no CVA tenderness  Musculoskeletal: No lower extremity tenderness. No joint effusions, no DVT signs strong distal pulses no edema, patient does have tenderness to palpation to the right hip with range of motion, there is no focal tenderness, it is not hot to touch it is not red is not swollen there is no evidence of infection she has strong distal pulses intact straight leg raise Neurologic:  Normal  speech and language. No gross focal neurologic deficits are appreciated. No saddle anesthesia Skin:  Skin is warm, dry and intact. No rash noted. Psychiatric: Mood and affect are normal. Speech and behavior are normal.  ____________________________________________   LABS (all labs ordered are listed, but only abnormal results are displayed)  Labs Reviewed  CBC WITH DIFFERENTIAL/PLATELET - Abnormal; Notable for the following:    RBC 3.57 (*)    Hemoglobin 10.5 (*)    HCT 31.7 (*)    RDW 14.6 (*)    All other components within normal limits  BASIC METABOLIC PANEL - Abnormal; Notable for the following:    BUN 36 (*)    Creatinine, Ser 2.57 (*)    Calcium 8.6 (*)    GFR calc non Af Amer 16 (*)    GFR calc Af Amer 19 (*)    All other components within normal limits   ____________________________________________  EKG   ____________________________________________  RADIOLOGY   ____________________________________________   PROCEDURES  Procedure(s) performed: None  Critical Care performed: None  ____________________________________________   INITIAL IMPRESSION / ASSESSMENT AND PLAN / ED COURSE  Pertinent labs & imaging results that were available during my care of the patient were reviewed by me and considered in my medical decision making (see chart for details).  Patient with chronic arthritic hip pain presents today with what she describes as her chronic pain. I did give her pain medication and she is pain-free at this time, able to ambulate with no difficulty using her walker. No sign of cauda equina syndrome, significant radiculopathy, no sign of infection no sign of occult fracture by history or exam. Patient will follow closely with her primary care doctor. She is wearing her close and eager to be discharged. Renal function noted, it is close to her baseline and she will follow-up. ____________________________________________   FINAL CLINICAL IMPRESSION(S) / ED  DIAGNOSES  Final diagnoses:  Hip arthritis     Schuyler Amor, MD 07/21/15 [place], MD 07/21/15 1700

## 2015-07-24 ENCOUNTER — Emergency Department (HOSPITAL_COMMUNITY): Payer: Medicare Other

## 2015-07-24 ENCOUNTER — Encounter (HOSPITAL_COMMUNITY): Payer: Self-pay | Admitting: *Deleted

## 2015-07-24 ENCOUNTER — Emergency Department (HOSPITAL_COMMUNITY)
Admission: EM | Admit: 2015-07-24 | Discharge: 2015-07-25 | Disposition: A | Discharge status: Home / Self Care | Payer: Medicare Other | Attending: Emergency Medicine | Admitting: Emergency Medicine

## 2015-07-24 DIAGNOSIS — Z87891 Personal history of nicotine dependence: Secondary | ICD-10-CM | POA: Diagnosis not present

## 2015-07-24 DIAGNOSIS — M25551 Pain in right hip: Secondary | ICD-10-CM | POA: Insufficient documentation

## 2015-07-24 DIAGNOSIS — I10 Essential (primary) hypertension: Secondary | ICD-10-CM | POA: Insufficient documentation

## 2015-07-24 DIAGNOSIS — E119 Type 2 diabetes mellitus without complications: Secondary | ICD-10-CM | POA: Insufficient documentation

## 2015-07-24 DIAGNOSIS — R609 Edema, unspecified: Secondary | ICD-10-CM | POA: Insufficient documentation

## 2015-07-24 LAB — URINALYSIS, ROUTINE W REFLEX MICROSCOPIC
Bilirubin Urine: NEGATIVE
Glucose, UA: NEGATIVE mg/dL
Hgb urine dipstick: NEGATIVE
Ketones, ur: NEGATIVE mg/dL
Leukocytes, UA: NEGATIVE
Nitrite: NEGATIVE
Protein, ur: NEGATIVE mg/dL
Specific Gravity, Urine: 1.01 (ref 1.005–1.030)
Urobilinogen, UA: 1 mg/dL (ref 0.0–1.0)
pH: 5.5 (ref 5.0–8.0)

## 2015-07-24 LAB — CBC WITH DIFFERENTIAL/PLATELET
Basophils Absolute: 0 10*3/uL (ref 0.0–0.1)
Basophils Relative: 0 %
Eosinophils Absolute: 0.1 10*3/uL (ref 0.0–0.7)
Eosinophils Relative: 1 %
HCT: 36.1 % (ref 36.0–46.0)
Hemoglobin: 11.3 g/dL — ABNORMAL LOW (ref 12.0–15.0)
Lymphocytes Relative: 26 %
Lymphs Abs: 1.3 10*3/uL (ref 0.7–4.0)
MCH: 28.5 pg (ref 26.0–34.0)
MCHC: 31.3 g/dL (ref 30.0–36.0)
MCV: 91.2 fL (ref 78.0–100.0)
Monocytes Absolute: 0.5 10*3/uL (ref 0.1–1.0)
Monocytes Relative: 10 %
Neutro Abs: 3 10*3/uL (ref 1.7–7.7)
Neutrophils Relative %: 63 %
Platelets: 205 10*3/uL (ref 150–400)
RBC: 3.96 MIL/uL (ref 3.87–5.11)
RDW: 14.5 % (ref 11.5–15.5)
WBC: 4.9 10*3/uL (ref 4.0–10.5)

## 2015-07-24 LAB — COMPREHENSIVE METABOLIC PANEL
ALT: 59 U/L — ABNORMAL HIGH (ref 14–54)
AST: 79 U/L — ABNORMAL HIGH (ref 15–41)
Albumin: 3.1 g/dL — ABNORMAL LOW (ref 3.5–5.0)
Alkaline Phosphatase: 120 U/L (ref 38–126)
Anion gap: 7 (ref 5–15)
BUN: 24 mg/dL — ABNORMAL HIGH (ref 6–20)
CO2: 25 mmol/L (ref 22–32)
Calcium: 8.9 mg/dL (ref 8.9–10.3)
Chloride: 107 mmol/L (ref 101–111)
Creatinine, Ser: 2.14 mg/dL — ABNORMAL HIGH (ref 0.44–1.00)
GFR calc Af Amer: 24 mL/min — ABNORMAL LOW (ref >60)
GFR calc non Af Amer: 20 mL/min — ABNORMAL LOW (ref >60)
Glucose, Bld: 77 mg/dL (ref 65–99)
Potassium: 3.7 mmol/L (ref 3.5–5.1)
Sodium: 139 mmol/L (ref 135–145)
Total Bilirubin: 0.9 mg/dL (ref 0.3–1.2)
Total Protein: 6.1 g/dL — ABNORMAL LOW (ref 6.5–8.1)

## 2015-07-24 MED ORDER — DIAZEPAM 5 MG/ML IJ SOLN
2.5000 mg | Freq: Once | INTRAMUSCULAR | Status: AC
  Administered 2015-07-24: 2.5 mg via INTRAVENOUS
  Filled 2015-07-24: qty 2

## 2015-07-24 MED ORDER — MORPHINE SULFATE (PF) 4 MG/ML IV SOLN
4.0000 mg | Freq: Once | INTRAVENOUS | Status: AC
  Administered 2015-07-24: 4 mg via INTRAVENOUS
  Filled 2015-07-24: qty 1

## 2015-07-24 MED ORDER — OXYCODONE-ACETAMINOPHEN 5-325 MG PO TABS: 1.0000 | ORAL_TABLET | ORAL | Status: DC | PRN

## 2015-07-24 MED ORDER — OXYCODONE-ACETAMINOPHEN 5-325 MG PO TABS
1.0000 | ORAL_TABLET | Freq: Once | ORAL | Status: AC
  Administered 2015-07-24: 1 via ORAL
  Filled 2015-07-24: qty 1

## 2015-07-24 NOTE — ED Notes (Signed)
Attempt made to ambulate pt on the hallway pt unable to even get out of bed, pt states she didn't get no relief from pain medication.

## 2015-07-24 NOTE — ED Provider Notes (Signed)
Patient signed out by Gloriann Loan, PA-C, pending labs and imaging for chronic worsening right hip pain evaluated by PCP and by orthopedics. She reports pain is some better with medications here. Labs and imaging reassuring. CT abd/pel done to insure hip pain was not referred from abdominal source. She has a lumbar MRI pending ordered by PCP.   She walks with a walker at home and is encouraged to use it with any ambulating. Will give Percocet for pain. She had narcotic pain relievers here and tolerated well with decreased mental status. Stable for discharge home.  Charlann Lange, PA-C 07/24/15 2337  Quintella Reichert, MD 07/25/15 0230

## 2015-07-24 NOTE — ED Notes (Signed)
Patient transported to CT 

## 2015-07-24 NOTE — ED Provider Notes (Signed)
CSN: [id number]     Arrival date & time 07/24/15  1815 History   First MD Initiated Contact with Patient 07/24/15 1818     Chief Complaint  Patient presents with  . Hip Pain     (Consider location/radiation/quality/duration/timing/severity/associated sxs/prior Treatment) Patient is a 81 y.o. female presenting with hip pain. The history is provided by the patient.  Hip Pain  Denise [name] is a 81 y.o. female with PMH significant for DM, HTN, and osteoarthritis who presents with gradual, persistent, worsening right hip pain for the past 1.5 weeks.  She was seen 07/21/15 for the same right hip pain and prescribed tramadol; however, she states it provides no relief.  Plain films show no evidence of fractures or abnormalities.  She states she has had 3 xrays to evaluate her hip.  No recent trauma or injury.  She normally ambulates with a cane; however, over the past week or so she has been unable to ambulate due to severe pain.  She states she has been dealing with bilateral hip pain for the past 1.5 months, and that she's seen her PCP, Dr. Chancy Milroy.  She has also seen Dr. Eliberto Ivory, orthopedics, for hip pain and has received steroid injections in the right hip.  She reports that she has an MRI scheduled for her right hip on October 10, but that the pain is unbearable and she cannot wait that long.  Denies fevers, chills, N/V/D, CP, SOB, numbness/tingling, incontinence, or bowel/bladder incontinence.   Past Medical History  Diagnosis Date  . Diabetes mellitus without complication   . Hypertension    Past Surgical History  Procedure Laterality Date  . Gallbladder surgery     History reviewed. No pertinent family history. Social History  Substance Use Topics  . Smoking status: Former Research scientist (life sciences)  . Smokeless tobacco: None  . Alcohol Use: No   OB History    No data available     Review of Systems  All other systems negative unless otherwise stated in HPI   Allergies  Review of patient's allergies  indicates no known allergies.  Home Medications   Prior to Admission medications   Medication Sig Start Date End Date Taking? Authorizing Provider  traMADol (ULTRAM) 50 MG tablet Take 1 tablet (50 mg total) by mouth every 12 (twelve) hours as needed for severe pain. 07/21/15 07/20/16  Schuyler Amor, MD   BP 155/75 mmHg  Pulse 62  Temp(Src) 97.7 F (36.5 C) (Oral)  Resp 16  SpO2 96% Physical Exam  Constitutional: She is oriented to person, place, and time. She appears well-developed and well-nourished.  HENT:  Head: Normocephalic and atraumatic.  Mouth/Throat: Oropharynx is clear and moist.  Eyes: Conjunctivae are normal.  Neck: Normal range of motion. Neck supple.  Cardiovascular: Normal rate, regular rhythm, normal heart sounds and intact distal pulses.   No murmur heard. 2+ pitting edema in lower extremities b/l.   Pulmonary/Chest: Effort normal and breath sounds normal. No respiratory distress. She has no wheezes. She has no rales.  Abdominal: Soft. Bowel sounds are normal. She exhibits no distension. There is no tenderness.  Musculoskeletal:       Right hip: She exhibits decreased range of motion, decreased strength and tenderness.       Left hip: Normal.  Lymphadenopathy:    She has no cervical adenopathy.  Neurological: She is alert and oriented to person, place, and time.  Skin: Skin is warm and dry.  Psychiatric: She has a normal mood and affect.  Her behavior is normal.    ED Course  Procedures (including critical care time) Labs Review Labs Reviewed - No data to display  Imaging Review No results found. I have personally reviewed and evaluated these images and lab results as part of my medical decision-making.   EKG Interpretation None      MDM   Final diagnoses:  Right hip pain    Patient presents with gradual, persistent, worsening right hip pain.  Denies fevers or chills. No injury or trauma.  Limited ROM of right hip due to pain, TTP.  Distal  pulses intact. Plain films and labs reviewed from 07/21/15 [facility]) show no signs of fracture.  No indication for re-imaging given no new injury/trauma.  MRI scheduled October 10 for right hip evaluation.  Doubt cauda equina.  Doubt radiculopathy.  Doubt infection.  VSS, NAD, pt appears non-toxic.  On exam, limited ROM of left hip, TTP, no signs of infection.  Suspect pain management issue.  Given percocet for pain management.  Will ambulate.  Plan to control pain, ambulate, and follow up with PCP.   Patient hand off to oncoming provider at shift change.    Gloriann Loan, PA-C 07/24/15 2007  Quintella Reichert, MD 07/25/15 0230

## 2015-07-24 NOTE — ED Notes (Signed)
Pt arrives from home via [employer]. Pt states she fell on Wednesday and hurt her right hip and right knee. No swelling or deformity noted. Bruises present are from a previous fall. Pt states she went to her PCP yesterday and was given tramadol, states this is not giving her any relief at this time.

## 2015-07-24 NOTE — Discharge Instructions (Signed)

## 2015-07-26 LAB — URINE CULTURE

## 2015-07-28 ENCOUNTER — Ambulatory Visit
Admission: RE | Admit: 2015-07-28 | Discharge: 2015-07-28 | Disposition: A | Discharge status: Home / Self Care | Payer: Medicare Other | Source: Ambulatory Visit | Attending: Internal Medicine | Admitting: Internal Medicine

## 2015-07-28 ENCOUNTER — Other Ambulatory Visit: Payer: Self-pay | Admitting: Internal Medicine

## 2015-07-28 DIAGNOSIS — M79669 Pain in unspecified lower leg: Secondary | ICD-10-CM

## 2015-07-28 DIAGNOSIS — S72141A Displaced intertrochanteric fracture of right femur, initial encounter for closed fracture: Secondary | ICD-10-CM | POA: Diagnosis not present

## 2015-07-28 DIAGNOSIS — S72001A Fracture of unspecified part of neck of right femur, initial encounter for closed fracture: Secondary | ICD-10-CM | POA: Diagnosis not present

## 2015-07-28 DIAGNOSIS — M7989 Other specified soft tissue disorders: Secondary | ICD-10-CM

## 2015-07-28 DIAGNOSIS — M79661 Pain in right lower leg: Secondary | ICD-10-CM

## 2015-07-29 ENCOUNTER — Emergency Department: Payer: Medicare Other

## 2015-07-29 ENCOUNTER — Other Ambulatory Visit: Payer: Self-pay

## 2015-07-29 ENCOUNTER — Inpatient Hospital Stay
Admission: EM | Admit: 2015-07-29 | Discharge: 2015-08-03 | DRG: 469 | Disposition: A | Discharge status: Skilled Nursing Facility | Payer: Medicare Other | Attending: Internal Medicine | Admitting: Internal Medicine

## 2015-07-29 ENCOUNTER — Encounter: Payer: Self-pay | Admitting: Emergency Medicine

## 2015-07-29 DIAGNOSIS — G8929 Other chronic pain: Secondary | ICD-10-CM | POA: Diagnosis present

## 2015-07-29 DIAGNOSIS — G9341 Metabolic encephalopathy: Secondary | ICD-10-CM | POA: Diagnosis present

## 2015-07-29 DIAGNOSIS — T383X5A Adverse effect of insulin and oral hypoglycemic [antidiabetic] drugs, initial encounter: Secondary | ICD-10-CM | POA: Diagnosis present

## 2015-07-29 DIAGNOSIS — M1990 Unspecified osteoarthritis, unspecified site: Secondary | ICD-10-CM | POA: Diagnosis present

## 2015-07-29 DIAGNOSIS — S72001A Fracture of unspecified part of neck of right femur, initial encounter for closed fracture: Secondary | ICD-10-CM | POA: Diagnosis present

## 2015-07-29 DIAGNOSIS — R410 Disorientation, unspecified: Secondary | ICD-10-CM | POA: Diagnosis present

## 2015-07-29 DIAGNOSIS — S72141A Displaced intertrochanteric fracture of right femur, initial encounter for closed fracture: Secondary | ICD-10-CM | POA: Diagnosis present

## 2015-07-29 DIAGNOSIS — R001 Bradycardia, unspecified: Secondary | ICD-10-CM | POA: Diagnosis not present

## 2015-07-29 DIAGNOSIS — I129 Hypertensive chronic kidney disease with stage 1 through stage 4 chronic kidney disease, or unspecified chronic kidney disease: Secondary | ICD-10-CM | POA: Diagnosis present

## 2015-07-29 DIAGNOSIS — E11649 Type 2 diabetes mellitus with hypoglycemia without coma: Secondary | ICD-10-CM | POA: Diagnosis present

## 2015-07-29 DIAGNOSIS — Y92009 Unspecified place in unspecified non-institutional (private) residence as the place of occurrence of the external cause: Secondary | ICD-10-CM | POA: Diagnosis not present

## 2015-07-29 DIAGNOSIS — L304 Erythema intertrigo: Secondary | ICD-10-CM | POA: Diagnosis present

## 2015-07-29 DIAGNOSIS — Z87891 Personal history of nicotine dependence: Secondary | ICD-10-CM | POA: Diagnosis not present

## 2015-07-29 DIAGNOSIS — Z96649 Presence of unspecified artificial hip joint: Secondary | ICD-10-CM

## 2015-07-29 DIAGNOSIS — E160 Drug-induced hypoglycemia without coma: Secondary | ICD-10-CM | POA: Diagnosis present

## 2015-07-29 DIAGNOSIS — H269 Unspecified cataract: Secondary | ICD-10-CM | POA: Diagnosis present

## 2015-07-29 DIAGNOSIS — E162 Hypoglycemia, unspecified: Secondary | ICD-10-CM

## 2015-07-29 DIAGNOSIS — Z79899 Other long term (current) drug therapy: Secondary | ICD-10-CM

## 2015-07-29 DIAGNOSIS — N183 Chronic kidney disease, stage 3 (moderate): Secondary | ICD-10-CM | POA: Diagnosis present

## 2015-07-29 DIAGNOSIS — W19XXXA Unspecified fall, initial encounter: Secondary | ICD-10-CM | POA: Diagnosis present

## 2015-07-29 DIAGNOSIS — M545 Low back pain: Secondary | ICD-10-CM | POA: Diagnosis present

## 2015-07-29 DIAGNOSIS — R52 Pain, unspecified: Secondary | ICD-10-CM

## 2015-07-29 DIAGNOSIS — T383X1A Poisoning by insulin and oral hypoglycemic [antidiabetic] drugs, accidental (unintentional), initial encounter: Secondary | ICD-10-CM | POA: Diagnosis present

## 2015-07-29 DIAGNOSIS — J449 Chronic obstructive pulmonary disease, unspecified: Secondary | ICD-10-CM | POA: Diagnosis present

## 2015-07-29 DIAGNOSIS — Z7984 Long term (current) use of oral hypoglycemic drugs: Secondary | ICD-10-CM

## 2015-07-29 HISTORY — DX: Unspecified cataract: H26.9

## 2015-07-29 HISTORY — DX: Unspecified osteoarthritis, unspecified site: M19.90

## 2015-07-29 HISTORY — DX: Chronic obstructive pulmonary disease, unspecified: J44.9

## 2015-07-29 HISTORY — DX: Emphysema, unspecified: J43.9

## 2015-07-29 LAB — GLUCOSE, CAPILLARY
Glucose-Capillary: 35 mg/dL — CL (ref 65–99)
Glucose-Capillary: 51 mg/dL — ABNORMAL LOW (ref 65–99)
Glucose-Capillary: 56 mg/dL — ABNORMAL LOW (ref 65–99)
Glucose-Capillary: 76 mg/dL (ref 65–99)
Glucose-Capillary: 86 mg/dL (ref 65–99)
Glucose-Capillary: 110 mg/dL — ABNORMAL HIGH (ref 65–99)
Glucose-Capillary: 197 mg/dL — ABNORMAL HIGH (ref 65–99)
Glucose-Capillary: 195 mg/dL — ABNORMAL HIGH (ref 65–99)

## 2015-07-29 LAB — CBC WITH DIFFERENTIAL/PLATELET
Basophils Absolute: 0 10*3/uL (ref 0–0.1)
Basophils Relative: 1 %
Eosinophils Absolute: 0 10*3/uL (ref 0–0.7)
Eosinophils Relative: 0 %
HCT: 33.6 % — ABNORMAL LOW (ref 35.0–47.0)
Hemoglobin: 10.9 g/dL — ABNORMAL LOW (ref 12.0–16.0)
Lymphocytes Relative: 18 %
Lymphs Abs: 0.7 10*3/uL — ABNORMAL LOW (ref 1.0–3.6)
MCH: 29.1 pg (ref 26.0–34.0)
MCHC: 32.6 g/dL (ref 32.0–36.0)
MCV: 89.3 fL (ref 80.0–100.0)
Monocytes Absolute: 0.4 10*3/uL (ref 0.2–0.9)
Monocytes Relative: 11 %
Neutro Abs: 2.7 10*3/uL (ref 1.4–6.5)
Neutrophils Relative %: 70 %
Platelets: 186 10*3/uL (ref 150–440)
RBC: 3.76 MIL/uL — ABNORMAL LOW (ref 3.80–5.20)
RDW: 14.9 % — ABNORMAL HIGH (ref 11.5–14.5)
WBC: 3.8 10*3/uL (ref 3.6–11.0)

## 2015-07-29 LAB — URINALYSIS COMPLETE WITH MICROSCOPIC (ARMC ONLY)
Bilirubin Urine: NEGATIVE
Glucose, UA: 150 mg/dL — AB
Hgb urine dipstick: NEGATIVE
Ketones, ur: NEGATIVE mg/dL
Leukocytes, UA: NEGATIVE
Nitrite: NEGATIVE
Protein, ur: NEGATIVE mg/dL
Specific Gravity, Urine: 1.013 (ref 1.005–1.030)
pH: 5 (ref 5.0–8.0)

## 2015-07-29 LAB — COMPREHENSIVE METABOLIC PANEL
ALT: 69 U/L — ABNORMAL HIGH (ref 14–54)
AST: 76 U/L — ABNORMAL HIGH (ref 15–41)
Albumin: 2.9 g/dL — ABNORMAL LOW (ref 3.5–5.0)
Alkaline Phosphatase: 126 U/L (ref 38–126)
Anion gap: 6 (ref 5–15)
BUN: 23 mg/dL — ABNORMAL HIGH (ref 6–20)
CO2: 26 mmol/L (ref 22–32)
Calcium: 8.7 mg/dL — ABNORMAL LOW (ref 8.9–10.3)
Chloride: 112 mmol/L — ABNORMAL HIGH (ref 101–111)
Creatinine, Ser: 1.78 mg/dL — ABNORMAL HIGH (ref 0.44–1.00)
GFR calc Af Amer: 29 mL/min — ABNORMAL LOW (ref >60)
GFR calc non Af Amer: 25 mL/min — ABNORMAL LOW (ref >60)
Glucose, Bld: 85 mg/dL (ref 65–99)
Potassium: 3.7 mmol/L (ref 3.5–5.1)
Sodium: 144 mmol/L (ref 135–145)
Total Bilirubin: 0.7 mg/dL (ref 0.3–1.2)
Total Protein: 5.8 g/dL — ABNORMAL LOW (ref 6.5–8.1)

## 2015-07-29 LAB — TROPONIN I: Troponin I: <0.03 ng/mL (ref <0.031)

## 2015-07-29 MED ORDER — DEXTROSE-NACL 5-0.9 % IV SOLN
INTRAVENOUS | Status: DC
  Administered 2015-07-29: 16:00:00 via INTRAVENOUS

## 2015-07-29 MED ORDER — DEXTROSE-NACL 5-0.9 % IV SOLN
INTRAVENOUS | Status: DC
  Administered 2015-07-29: 14:00:00 via INTRAVENOUS

## 2015-07-29 MED ORDER — INSULIN ASPART 100 UNIT/ML ~~LOC~~ SOLN
0.0000 [IU] | Freq: Three times a day (TID) | SUBCUTANEOUS | Status: DC
  Administered 2015-07-30: 17:00:00 2 [IU] via SUBCUTANEOUS
  Administered 2015-07-30: 3 [IU] via SUBCUTANEOUS
  Administered 2015-07-31: 1 [IU] via SUBCUTANEOUS
  Administered 2015-08-01: 2 [IU] via SUBCUTANEOUS
  Administered 2015-08-02: 1 [IU] via SUBCUTANEOUS
  Administered 2015-08-02: 3 [IU] via SUBCUTANEOUS
  Administered 2015-08-03 (×2): 2 [IU] via SUBCUTANEOUS
  Filled 2015-07-29: qty 3
  Filled 2015-07-29 (×2): qty 2
  Filled 2015-07-29: qty 1
  Filled 2015-07-29: qty 3
  Filled 2015-07-29: qty 2
  Filled 2015-07-29: qty 1
  Filled 2015-07-29 (×2): qty 2

## 2015-07-29 MED ORDER — ATORVASTATIN CALCIUM 20 MG PO TABS
40.0000 mg | ORAL_TABLET | Freq: Every day | ORAL | Status: DC
  Administered 2015-07-29 – 2015-08-02 (×5): 40 mg via ORAL
  Filled 2015-07-29 (×5): qty 2

## 2015-07-29 MED ORDER — LEVOTHYROXINE SODIUM 137 MCG PO TABS
137.0000 ug | ORAL_TABLET | Freq: Every day | ORAL | Status: DC
  Administered 2015-07-30 – 2015-08-03 (×4): 137 ug via ORAL
  Filled 2015-07-29 (×4): qty 1

## 2015-07-29 MED ORDER — MOMETASONE FURO-FORMOTEROL FUM 100-5 MCG/ACT IN AERO
2.0000 | INHALATION_SPRAY | Freq: Two times a day (BID) | RESPIRATORY_TRACT | Status: DC
  Administered 2015-07-30 – 2015-08-02 (×4): 2 via RESPIRATORY_TRACT
  Filled 2015-07-29: qty 8.8

## 2015-07-29 MED ORDER — ACETAMINOPHEN 325 MG PO TABS
650.0000 mg | ORAL_TABLET | Freq: Four times a day (QID) | ORAL | Status: DC | PRN
  Administered 2015-07-29: 22:00:00 650 mg via ORAL
  Filled 2015-07-29: qty 2

## 2015-07-29 MED ORDER — VERAPAMIL HCL ER 240 MG PO TBCR
240.0000 mg | EXTENDED_RELEASE_TABLET | Freq: Every day | ORAL | Status: DC
  Administered 2015-07-29 – 2015-08-02 (×5): 240 mg via ORAL
  Filled 2015-07-29 (×6): qty 1

## 2015-07-29 MED ORDER — PANTOPRAZOLE SODIUM 40 MG PO TBEC
40.0000 mg | DELAYED_RELEASE_TABLET | Freq: Every day | ORAL | Status: DC
  Administered 2015-07-29 – 2015-08-03 (×6): 40 mg via ORAL
  Filled 2015-07-29 (×6): qty 1

## 2015-07-29 MED ORDER — FENOFIBRATE 54 MG PO TABS
54.0000 mg | ORAL_TABLET | Freq: Every day | ORAL | Status: DC
  Administered 2015-07-30 – 2015-08-03 (×5): 54 mg via ORAL
  Filled 2015-07-29 (×5): qty 1

## 2015-07-29 MED ORDER — ACETAMINOPHEN 650 MG RE SUPP: 650.0000 mg | Freq: Four times a day (QID) | RECTAL | Status: DC | PRN

## 2015-07-29 MED ORDER — ONDANSETRON HCL 4 MG/2ML IJ SOLN
4.0000 mg | Freq: Four times a day (QID) | INTRAMUSCULAR | Status: DC | PRN
  Administered 2015-07-31: 4 mg via INTRAVENOUS

## 2015-07-29 MED ORDER — TIOTROPIUM BROMIDE MONOHYDRATE 18 MCG IN CAPS
18.0000 ug | ORAL_CAPSULE | Freq: Every day | RESPIRATORY_TRACT | Status: DC
  Administered 2015-07-29 – 2015-08-02 (×5): 18 ug via RESPIRATORY_TRACT
  Filled 2015-07-29 (×2): qty 5

## 2015-07-29 MED ORDER — ENOXAPARIN SODIUM 40 MG/0.4ML ~~LOC~~ SOLN: 40.0000 mg | SUBCUTANEOUS | Status: DC

## 2015-07-29 MED ORDER — SENNOSIDES-DOCUSATE SODIUM 8.6-50 MG PO TABS: 1.0000 | ORAL_TABLET | Freq: Every evening | ORAL | Status: DC | PRN

## 2015-07-29 MED ORDER — ONDANSETRON HCL 4 MG PO TABS: 4.0000 mg | ORAL_TABLET | Freq: Four times a day (QID) | ORAL | Status: DC | PRN

## 2015-07-29 MED ORDER — MELOXICAM 7.5 MG PO TABS
7.5000 mg | ORAL_TABLET | Freq: Two times a day (BID) | ORAL | Status: DC
  Administered 2015-07-29 – 2015-07-31 (×4): 7.5 mg via ORAL
  Filled 2015-07-29 (×4): qty 1

## 2015-07-29 MED ORDER — ENOXAPARIN SODIUM 30 MG/0.3ML ~~LOC~~ SOLN
30.0000 mg | SUBCUTANEOUS | Status: DC
  Administered 2015-07-29: 30 mg via SUBCUTANEOUS
  Filled 2015-07-29: qty 0.3

## 2015-07-29 MED ORDER — IRBESARTAN 75 MG PO TABS
37.5000 mg | ORAL_TABLET | Freq: Every day | ORAL | Status: DC
  Administered 2015-07-29 – 2015-08-03 (×6): 37.5 mg via ORAL
  Filled 2015-07-29 (×6): qty 1

## 2015-07-29 MED ORDER — OCTREOTIDE ACETATE 100 MCG/ML IJ SOLN
50.0000 ug | Freq: Once | INTRAMUSCULAR | Status: DC
  Filled 2015-07-29: qty 1

## 2015-07-29 MED ORDER — FLUTICASONE FUROATE-VILANTEROL 100-25 MCG/INH IN AEPB: 1.0000 | INHALATION_SPRAY | Freq: Every day | RESPIRATORY_TRACT | Status: DC

## 2015-07-29 MED ORDER — TRAMADOL HCL 50 MG PO TABS
50.0000 mg | ORAL_TABLET | Freq: Two times a day (BID) | ORAL | Status: DC | PRN
  Administered 2015-07-30: 21:00:00 50 mg via ORAL
  Filled 2015-07-29: qty 1

## 2015-07-29 NOTE — Progress Notes (Signed)
Enoxaparin 40 mg daily was ordered for DVT prophylaxis. Changed to 30 mg daily per protocol due to renal function (estimated CrCl ~ 29 mL/min)  Theodis Shove PharmD, 07/29/15

## 2015-07-29 NOTE — ED Provider Notes (Signed)
[facility] Emergency Department Provider Note  ____________________________________________  Time seen: Seen upon arrival to the emergency department  I have reviewed the triage vital signs and the nursing notes.   HISTORY  Chief Complaint Hypoglycemia    HPI Denise [name] is a 81 y.o. female with a history of diabetes and hypertension on glimepiride was presenting today with multiple episodes of hypoglycemia. The patient said that despite eating a large hamburger dinner last night with hot spot is she still had a glucose of 50 last evening. She ate some crackers which brought up to 75. Her daughter became concerned this morning when the mother did not respond to phone calls. She went to the apartment of her mother and found her mother unconscious. Medics were called and found the patient's sugar to be in the 20s. She was given D50 with resolution to her baseline mental status. At this time the patient remains her baseline mental status and is only complaining of pain to her right posterior thigh which she says has been ongoing. She says that she has had 2 recent hospital visits for it and has an MRI scheduled for this Sunday. She also has chronic low back pain which she says is unchanged. Denies any urinary frequency or burning. No cough or fever at home. Pain or shortness of breath. She does state that she has been increasingly confused lately. Her primary care doctor is also tracking her kidney function which is been steadily worsening. She is unsure when her dose of glyburide was taken. She says she may have taken some last night but doesn't think so. She has not had any recent medication adjustments.   Past Medical History  Diagnosis Date  . Diabetes mellitus without complication ([place])   . Hypertension     There are no active problems to display for this patient.   Past Surgical History  Procedure Laterality Date  . Gallbladder surgery      Current  Outpatient Rx  Name  Route  Sig  Dispense  Refill  . atorvastatin (LIPITOR) 40 MG tablet   Oral   Take 40 mg by mouth at bedtime.          . fenofibrate (TRICOR) 145 MG tablet   Oral   Take 145 mg by mouth daily.         . Fluticasone Furoate-Vilanterol (BREO ELLIPTA) 100-25 MCG/INH AEPB   Inhalation   Inhale 1 puff into the lungs daily.         Marland Kitchen glimepiride (AMARYL) 4 MG tablet   Oral   Take 4 mg by mouth 2 (two) times daily.         Marland Kitchen levothyroxine (SYNTHROID, LEVOTHROID) 137 MCG tablet   Oral   Take 137 mcg by mouth daily.          Marland Kitchen LORazepam (ATIVAN) 1 MG tablet   Oral   Take 1 mg by mouth once. 1 hour before procedure         . meloxicam (MOBIC) 7.5 MG tablet   Oral   Take 7.5 mg by mouth 2 (two) times daily after a meal.          . metoprolol tartrate (LOPRESSOR) 25 MG tablet   Oral   Take 25 mg by mouth 2 (two) times daily.         Marland Kitchen oxyCODONE-acetaminophen (PERCOCET/ROXICET) 5-325 MG tablet   Oral   Take 1-2 tablets by mouth every 4 (four) hours as needed for  severe pain. Patient taking differently: Take 1 tablet by mouth every morning.    15 tablet   0   . pantoprazole (PROTONIX) 40 MG tablet   Oral   Take 40 mg by mouth daily.         Marland Kitchen tiotropium (SPIRIVA) 18 MCG inhalation capsule   Inhalation   Place 18 mcg into inhaler and inhale daily.         . traMADol (ULTRAM) 50 MG tablet   Oral   Take 1 tablet (50 mg total) by mouth every 12 (twelve) hours as needed for severe pain. Patient taking differently: Take 50 mg by mouth 2 (two) times daily.    12 tablet   0   . valsartan (DIOVAN) 160 MG tablet   Oral   Take 160 mg by mouth daily.         . verapamil (CALAN-SR) 240 MG CR tablet   Oral   Take 240 mg by mouth at bedtime.           Allergies Review of patient's allergies indicates no known allergies.  History reviewed. No pertinent family history.  Social History Social History  Substance Use Topics  .  Smoking status: Former Research scientist (life sciences)  . Smokeless tobacco: None  . Alcohol Use: No    Review of Systems Constitutional: No fever/chills Eyes: No visual changes. ENT: No sore throat. Cardiovascular: Denies chest pain. Respiratory: Denies shortness of breath. Gastrointestinal: No abdominal pain.  No nausea, no vomiting.  No diarrhea.  No constipation. Genitourinary: Negative for dysuria. Musculoskeletal: Negative for back pain. Skin: Negative for rash. Neurological: Negative for headaches, focal weakness or numbness.  10-point ROS otherwise negative.  ____________________________________________   PHYSICAL EXAM:  VITAL SIGNS: ED Triage Vitals  Enc Vitals Group     BP --      Pulse --      Resp --      Temp --      Temp src --      SpO2 --      Weight --      Height --      Head Cir --      Peak Flow --      Pain Score --      Pain Loc --      Pain Edu? --      Excl. in [place]? --     Constitutional: Alert and oriented. Well appearing and in no acute distress. Eyes: Conjunctivae are normal. PERRL. EOMI. Head: Atraumatic. Nose: No congestion/rhinnorhea. Mouth/Throat: Mucous membranes are moist.  Oropharynx non-erythematous. Neck: No stridor.   Cardiovascular: Normal rate, regular rhythm. Grossly normal heart sounds.  Good peripheral circulation. Respiratory: Normal respiratory effort.  No retractions. Lungs CTAB. Gastrointestinal: Soft and nontender. No distention. No abdominal bruits. No CVA tenderness. Musculoskeletal: Tenderness to the right inferior buttock. There is no mass palpated. Patient with bilateral 4-5 active range of motion of the lower 70s but painful on the right side. No saddle anesthesia.  Neurologic:  Normal speech and language. No gross focal neurologic deficits are appreciated. No gait instability. Skin:  Skin is warm, dry and intact. No rash noted. Psychiatric: Mood and affect are normal. Speech and behavior are  normal.  ____________________________________________   LABS (all labs ordered are listed, but only abnormal results are displayed)  Labs Reviewed  CBC WITH DIFFERENTIAL/PLATELET - Abnormal; Notable for the following:    RBC 3.76 (*)    Hemoglobin 10.9 (*)    HCT  33.6 (*)    RDW 14.9 (*)    All other components within normal limits  COMPREHENSIVE METABOLIC PANEL - Abnormal; Notable for the following:    Chloride 112 (*)    BUN 23 (*)    Creatinine, Ser 1.78 (*)    Calcium 8.7 (*)    Total Protein 5.8 (*)    Albumin 2.9 (*)    AST 76 (*)    ALT 69 (*)    GFR calc non Af Amer 25 (*)    GFR calc Af Amer 29 (*)    All other components within normal limits  GLUCOSE, CAPILLARY - Abnormal; Notable for the following:    Glucose-Capillary 35 (*)    All other components within normal limits  GLUCOSE, CAPILLARY - Abnormal; Notable for the following:    Glucose-Capillary 56 (*)    All other components within normal limits  GLUCOSE, CAPILLARY - Abnormal; Notable for the following:    Glucose-Capillary 51 (*)    All other components within normal limits  TROPONIN I  GLUCOSE, CAPILLARY  URINALYSIS COMPLETEWITH MICROSCOPIC ([facility] ONLY)  CBG MONITORING, ED  CBG MONITORING, ED  CBG MONITORING, ED  CBG MONITORING, ED  CBG MONITORING, ED  CBG MONITORING, ED  CBG MONITORING, ED  CBG MONITORING, ED  CBG MONITORING, ED  CBG MONITORING, ED  CBG MONITORING, ED  CBG MONITORING, ED  CBG MONITORING, ED  CBG MONITORING, ED  CBG MONITORING, ED   ____________________________________________  EKG  ED ECG REPORT I, [place],  Youlanda Roys, the attending physician, personally viewed and interpreted this ECG.   Date: 07/29/2015  EKG Time: 1041  Rate: 58  Rhythm: sinus tachycardia  Axis: Normal axis  Intervals:none  ST&T Change: No ST elevations or depressions. Biphasic T-wave in V3 which is unchanged from the morphology seen on 11/04/2013.    ____________________________________________  RADIOLOGY  Mild left lung base atelectasis suspected. Otherwise no acute abnormality. I personally reviewed these films. ____________________________________________   PROCEDURES  CRITICAL CARE Performed by: Doran Stabler   Total critical care time: 35 minutes  Critical care time was exclusive of separately billable procedures and treating other patients.  Critical care was necessary to treat or prevent imminent or life-threatening deterioration.  Critical care was time spent personally by me on the following activities: development of treatment plan with patient and/or surrogate as well as nursing, discussions with consultants, evaluation of patient's response to treatment, examination of patient, obtaining history from patient or surrogate, ordering and performing treatments and interventions, ordering and review of laboratory studies, ordering and review of radiographic studies, pulse oximetry and re-evaluation of patient's condition.  For fracture he hypoglycemia with multiple fingersticks requiring octreotide and D5 normal saline infusion. ____________________________________________   INITIAL IMPRESSION / ASSESSMENT AND PLAN / ED COURSE  Pertinent labs & imaging results that were available during my care of the patient were reviewed by me and considered in my medical decision making (see chart for details).  ----------------------------------------- 1:54 PM on 07/29/2015 -----------------------------------------  Despite one dose of D50, food the patient is still persistently hypoglycemic to the 50s. She is at her baseline mental status. She is currently receiving a D5 normal saline infusion and will be given 50 g of octreotide. I discussed with her the need for admission because of her persistent hypoglycemia despite multiple interventions. She says she does not know when the last time was that she took on the dry but  does not think she took it yesterday. Otherwise it  is unclear why she is persistent hypoglycemic. Patient understands the need for admission and is willing to comply. I discussed case with Dr.[place] of the medicine service will admit the patient to the hospital. ____________________________________________   FINAL CLINICAL IMPRESSION(S) / ED DIAGNOSES  Refractory hypoglycemia. Initial visit.    Orbie Pyo, MD 07/29/15 [phone]

## 2015-07-29 NOTE — H&P (Signed)
[facility] [place] NAME: Denise [name]    MR#:  [id number]  DATE OF BIRTH:  09-12-1930  DATE OF ADMISSION:  07/29/2015  PRIMARY CARE PHYSICIAN: Perrin Maltese, MD   REQUESTING/REFERRING PHYSICIAN: Dr. Dineen Kid  CHIEF COMPLAINT:   Chief Complaint  Patient presents with  . Hypoglycemia    HISTORY OF PRESENT ILLNESS:  Denise [name]  is a 81 y.o. female with a known history of hypertension, diabetes mellitus, recent right hip pain came in secondary to hypoglycemia. Patient had lipase low blood glucose of 50 yesterday evening, patient had large hamburger, persistently had low glucose of 75. Patient's daughter called her this morning but she did not respond to the phone call, daughter went to check on patient when she she found her unconscious. Patient blood glucose at that time was 20,.D50 is given by EMS. Blood glucose went up to 181 but then dropped again to 35.  Patient is not sure when she took her glipizide. Has been taking the Percocet, tramadol for right hip pain recently. Subsequent blood glucose levels were still low at 35's started on D10 in the ER. She is alert oriented. Denies any nausea or vomiting. Also had a recent fall history due to his complaints.   PAST MEDICAL HISTORY:   Past Medical History  Diagnosis Date  . Diabetes mellitus without complication ([place])   . Hypertension     PAST SURGICAL HISTOIRY:   Past Surgical History  Procedure Laterality Date  . Gallbladder surgery      SOCIAL HISTORY:   Social History  Substance Use Topics  . Smoking status: Former Research scientist (life sciences)  . Smokeless tobacco: Not on file  . Alcohol Use: No    FAMILY HISTORY:  History reviewed. No pertinent family history.  DRUG ALLERGIES:  No Known Allergies  REVIEW OF SYSTEMS:  CONSTITUTIONAL: No fever, fatigue or weakness.  EYES: No blurred or double vision.  EARS, NOSE, AND THROAT: No tinnitus or ear pain.  RESPIRATORY: No cough,  shortness of breath, wheezing or hemoptysis.  CARDIOVASCULAR: No chest pain, orthopnea, edema.  GASTROINTESTINAL: No nausea, vomiting, diarrhea or abdominal pain.  GENITOURINARY: No dysuria, hematuria.  ENDOCRINE: No polyuria, nocturia,  HEMATOLOGY: No anemia, easy bruising or bleeding SKIN: No rash or lesion. MUSCULOSKELETAL: No joint pain or arthritis.   NEUROLOGIC: No tingling, numbness, weakness.  PSYCHIATRY: No anxiety or depression.   MEDICATIONS AT HOME:   Prior to Admission medications   Medication Sig Start Date End Date Taking? Authorizing Provider  atorvastatin (LIPITOR) 40 MG tablet Take 40 mg by mouth at bedtime.    Yes Historical Provider, MD  fenofibrate (TRICOR) 145 MG tablet Take 145 mg by mouth daily.   Yes Historical Provider, MD  Fluticasone Furoate-Vilanterol (BREO ELLIPTA) 100-25 MCG/INH AEPB Inhale 1 puff into the lungs daily.   Yes Historical Provider, MD  glimepiride (AMARYL) 4 MG tablet Take 4 mg by mouth 2 (two) times daily.   Yes Historical Provider, MD  levothyroxine (SYNTHROID, LEVOTHROID) 137 MCG tablet Take 137 mcg by mouth daily.    Yes Historical Provider, MD  LORazepam (ATIVAN) 1 MG tablet Take 1 mg by mouth once. 1 hour before procedure   Yes Historical Provider, MD  meloxicam (MOBIC) 7.5 MG tablet Take 7.5 mg by mouth 2 (two) times daily after a meal.    Yes Historical Provider, MD  metoprolol tartrate (LOPRESSOR) 25 MG tablet Take 25 mg by mouth 2 (two) times daily.   Yes  Historical Provider, MD  oxyCODONE-acetaminophen (PERCOCET/ROXICET) 5-325 MG tablet Take 1-2 tablets by mouth every 4 (four) hours as needed for severe pain. Patient taking differently: Take 1 tablet by mouth every morning.  07/24/15  Yes Shari Upstill, PA-C  pantoprazole (PROTONIX) 40 MG tablet Take 40 mg by mouth daily.   Yes Historical Provider, MD  tiotropium (SPIRIVA) 18 MCG inhalation capsule Place 18 mcg into inhaler and inhale daily.   Yes Historical Provider, MD  traMADol  (ULTRAM) 50 MG tablet Take 1 tablet (50 mg total) by mouth every 12 (twelve) hours as needed for severe pain. Patient taking differently: Take 50 mg by mouth 2 (two) times daily.  07/21/15 07/20/16 Yes Schuyler Amor, MD  valsartan (DIOVAN) 160 MG tablet Take 160 mg by mouth daily.   Yes Historical Provider, MD  verapamil (CALAN-SR) 240 MG CR tablet Take 240 mg by mouth at bedtime.   Yes Historical Provider, MD      VITAL SIGNS:  Blood pressure 163/74, pulse 56, temperature 97.6 F (36.4 C), temperature source Oral, resp. rate 14, height 5\' 6"  (1.676 m), weight 97.977 kg (216 lb), SpO2 98 %.  PHYSICAL EXAMINATION:  GENERAL:  81 y.o.-year-old patient lying in the bed with no acute distress.  EYES: Pupils equal, round, reactive to light and accommodation. No scleral icterus. Extraocular muscles intact.  HEENT: Head atraumatic, normocephalic. Oropharynx and nasopharynx clear.  NECK:  Supple, no jugular venous distention. No thyroid enlargement, no tenderness.  LUNGS: Normal breath sounds bilaterally, no wheezing, rales,rhonchi or crepitation. No use of accessory muscles of respiration.  CARDIOVASCULAR: S1, S2 normal. No murmurs, rubs, or gallops.  ABDOMEN: Soft, nontender, nondistended. Bowel sounds present. No organomegaly or mass.  EXTREMITIES: No pedal edema, cyanosis, or clubbing.  Pt has bruise in  Right leg. NEUROLOGIC: Cranial nerves II through XII are intact. Muscle strength 5/5 in all extremities. Sensation intact. Gait not checked.  PSYCHIATRIC: The patient is alert and oriented x 3.  SKIN: No obvious rash, lesion, or ulcer.   LABORATORY PANEL:   CBC  Recent Labs Lab 07/29/15 1059  WBC 3.8  HGB 10.9*  HCT 33.6*  PLT 186   ------------------------------------------------------------------------------------------------------------------  Chemistries   Recent Labs Lab 07/29/15 1059  NA 144  K 3.7  CL 112*  CO2 26  GLUCOSE 85  BUN 23*  CREATININE 1.78*  CALCIUM  8.7*  AST 76*  ALT 69*  ALKPHOS 126  BILITOT 0.7   ------------------------------------------------------------------------------------------------------------------  Cardiac Enzymes  Recent Labs Lab 07/29/15 1059  TROPONINI <0.03   ------------------------------------------------------------------------------------------------------------------  RADIOLOGY:  Dg Chest 1 View  07/29/2015   CLINICAL DATA:  81 year old female with stroke like symptoms, hypoglycemia on EMS arrival. Initial encounter.  EXAM: CHEST 1 VIEW  COMPARISON:  11/04/2013 and earlier.  FINDINGS: Portable AP upright view at 1051 hours. Stable cardiac size and mediastinal contours. Stable lung volumes. No pneumothorax or pulmonary edema. No pleural effusion or consolidation. Mild retrocardiac patchy opacity most resembles atelectasis.  IMPRESSION: Mild left lung base atelectasis suspected. Otherwise, no acute cardiopulmonary abnormality.   Electronically Signed   By: Genevie Ann M.D.   On: 07/29/2015 11:01   US Venous Img Lower Unilateral Right  07/28/2015   CLINICAL DATA:  RIGHT leg pain, edema, color change, and calf tenderness. Symptom duration unspecified.  EXAM: RIGHT LOWER EXTREMITY VENOUS DOPPLER ULTRASOUND  TECHNIQUE: Gray-scale sonography with graded compression, as well as color Doppler and duplex ultrasound were performed to evaluate the lower extremity deep venous systems from  the level of the common femoral vein and including the common femoral, femoral, profunda femoral, popliteal and calf veins including the posterior tibial, peroneal and gastrocnemius veins when visible. The superficial great saphenous vein was also interrogated. Spectral Doppler was utilized to evaluate flow at rest and with distal augmentation maneuvers in the common femoral, femoral and popliteal veins.  COMPARISON:  None.  FINDINGS: Contralateral Common Femoral Vein: Respiratory phasicity is normal and symmetric with the symptomatic side. No  evidence of thrombus. Normal compressibility.  Common Femoral Vein: No evidence of thrombus. Normal compressibility, respiratory phasicity and response to augmentation.  Saphenofemoral Junction: No evidence of thrombus. Normal compressibility and flow on color Doppler imaging.  Profunda Femoral Vein: No evidence of thrombus. Normal compressibility and flow on color Doppler imaging.  Femoral Vein: No evidence of thrombus. Normal compressibility, respiratory phasicity and response to augmentation.  Popliteal Vein: No evidence of thrombus. Normal compressibility, respiratory phasicity and response to augmentation.  Calf Veins: No evidence of thrombus. Normal compressibility and flow on color Doppler imaging.  Superficial Great Saphenous Vein: No evidence of thrombus. Normal compressibility and flow on color Doppler imaging.  Venous Reflux:  None.  Other Findings:  Somewhat limited exam due to body habitus.  IMPRESSION: No evidence of RIGHT lower extremity deep venous thrombosis.   Electronically Signed   By: Staci Righter M.D.   On: 07/28/2015 17:08    EKG:   Orders placed or performed during the hospital encounter of 07/29/15  . ED EKG  . ED EKG   EKG shows sinus bradycardia 58 bpm, no ST T changes.  IMPRESSION AND PLAN:   81 year old female with persistent hypoglycemia secondary to sulfonylureas; Hold the glyburide, check Accu-Cheks every 2 hours, continue D5 in the IV fluids at 50 an hour, patient blood glucose effects more than 100 persistently for 3 more times to discontinue IV fluids, change Accu-Cheks to every 4 hours at that time.  #2 metabolic encephalopathy secondary to hypoglycemia: Resolved. #3 hypertension, bradycardia: Hold the atenolol, continue losartan., Monitor on telemetry. Restart the atenolol if the heart rate is more than 80. #4 hip pain and ambulatory difficulties and fall history patient is on tramadol. Percocet has been discontinued secondary to side effects. Check physical  therapy evaluation. #5 chronic kidney disease stage III ;stable.   All the records are reviewed and case discussed with ED provider. Management plans discussed with the patient, family and they are in agreement.  CODE STATUS:full  TOTAL TIME TAKING CARE OF THIS PATIENT: 55 minutes.    Epifanio Lesches M.D on 07/29/2015 at 2:44 PM  Between 7am to 6pm - Pager - [phone]  After 6pm go to [email or web address] - password [email or web address] [facility]  Office  [phone]  CC: Primary care physician; Perrin Maltese, MD

## 2015-07-29 NOTE — Plan of Care (Signed)
Problem: Discharge Progression Outcomes Goal: Other Discharge Outcomes/Goals Outcome: Progressing Plan of care progress to goal for: 1. Discharge Plan:         Plan to Return Home Alone when Discharged. 2. Pain:         Denies Pain. 3. Hemodynamically Stable:         VSS.         Afebrile.         Remains on IVF's.  4. Complications:         Incontinent of Urine. 5. Diet:         Health Heart Carb Modified. Tolerating Well.         Thin Liquids. Tolerating Well.         Medications Administered Whole with H2O. Tolerating Well. 6. Activity:         Bedrest.

## 2015-07-29 NOTE — ED Notes (Signed)
Pt via ems from home; called out for stroke-like symptoms. Pt had blood sugar of 22 when EMS arrived. They gave her an amp of D50 and it increased to 181. Pt alert & oriented upon arrival.

## 2015-07-30 ENCOUNTER — Inpatient Hospital Stay: Payer: Medicare Other

## 2015-07-30 LAB — COMPREHENSIVE METABOLIC PANEL
ALT: 48 U/L (ref 14–54)
AST: 48 U/L — ABNORMAL HIGH (ref 15–41)
Albumin: 2.4 g/dL — ABNORMAL LOW (ref 3.5–5.0)
Alkaline Phosphatase: 95 U/L (ref 38–126)
Anion gap: 4 — ABNORMAL LOW (ref 5–15)
BUN: 20 mg/dL (ref 6–20)
CO2: 24 mmol/L (ref 22–32)
Calcium: 8.3 mg/dL — ABNORMAL LOW (ref 8.9–10.3)
Chloride: 115 mmol/L — ABNORMAL HIGH (ref 101–111)
Creatinine, Ser: 1.63 mg/dL — ABNORMAL HIGH (ref 0.44–1.00)
GFR calc Af Amer: 33 mL/min — ABNORMAL LOW (ref >60)
GFR calc non Af Amer: 28 mL/min — ABNORMAL LOW (ref >60)
Glucose, Bld: 162 mg/dL — ABNORMAL HIGH (ref 65–99)
Potassium: 4.2 mmol/L (ref 3.5–5.1)
Sodium: 143 mmol/L (ref 135–145)
Total Bilirubin: 0.9 mg/dL (ref 0.3–1.2)
Total Protein: 4.7 g/dL — ABNORMAL LOW (ref 6.5–8.1)

## 2015-07-30 LAB — GLUCOSE, CAPILLARY
Glucose-Capillary: 174 mg/dL — ABNORMAL HIGH (ref 65–99)
Glucose-Capillary: 237 mg/dL — ABNORMAL HIGH (ref 65–99)
Glucose-Capillary: 160 mg/dL — ABNORMAL HIGH (ref 65–99)
Glucose-Capillary: 139 mg/dL — ABNORMAL HIGH (ref 65–99)

## 2015-07-30 LAB — HEMOGLOBIN A1C: Hgb A1c MFr Bld: 7 % — ABNORMAL HIGH (ref 4.0–6.0)

## 2015-07-30 MED ORDER — CEFAZOLIN SODIUM-DEXTROSE 2-3 GM-% IV SOLR
2.0000 g | INTRAVENOUS | Status: AC
  Administered 2015-07-31: 2 g via INTRAVENOUS
  Filled 2015-07-30: qty 50

## 2015-07-30 MED ORDER — ENOXAPARIN SODIUM 40 MG/0.4ML ~~LOC~~ SOLN
40.0000 mg | Freq: Every day | SUBCUTANEOUS | Status: DC
  Administered 2015-07-30: 40 mg via SUBCUTANEOUS
  Filled 2015-07-30: qty 0.4

## 2015-07-30 MED ORDER — NYSTATIN 100000 UNIT/GM EX POWD
Freq: Three times a day (TID) | CUTANEOUS | Status: DC
  Administered 2015-07-30 – 2015-08-02 (×5): via TOPICAL
  Filled 2015-07-30 (×2): qty 15

## 2015-07-30 NOTE — Plan of Care (Signed)
Problem: Discharge Progression Outcomes Goal: Other Discharge Outcomes/Goals Outcome: Progressing Pt is alert and oriented, c/o hip pain but refuses tramadol. Tylenol given with no affect. Incontinent at times of urine. Daughter called, requesting placement for patient and for MRI to be done during admission b/c daughter will be unable to transport patient to MRI in [place] on Sunday d/t patient's inability to walk. Daughter is concerned about patient's diet.

## 2015-07-30 NOTE — Care Management (Signed)
Admitted to [facility] with the diagnosis of hypoglycemia. Lives at [facility] x [facility] years. Daughter is Jamelle Rushing [phone]). Last seen Dr. Humphrey Rolls on Tuesday. No home oxygen. No home health. No skilled facility. Uses a rolling walker to aid in ambulation. Takes care of all basic activities of daily living herself, doesn't drive anymore. Daughter helps with instrumental activities. Fell in the bathroom about 1 week ago. Decreased appetite. [place] ha an emergency call system Daughter can't take for MRI in [place] on Sunday.  Physical therapy evaluation pending. Shelbie Ammons RN MSN Care Management [phone]

## 2015-07-30 NOTE — Progress Notes (Signed)
[facility] [place] NAME: Denise [name]    MR#:  [id number]  DATE OF BIRTH:  [birth year]  SUBJECTIVE:  CHIEF COMPLAINT:   Chief Complaint  Patient presents with  . Hypoglycemia   Complains of right hip pain and inability to move due to pain  REVIEW OF SYSTEMS:   Review of Systems  Constitutional: Negative for fever.  Respiratory: Negative for shortness of breath.   Cardiovascular: Negative for chest pain and palpitations.  Gastrointestinal: Negative for nausea, vomiting and abdominal pain.  Genitourinary: Negative for dysuria.  Musculoskeletal: Positive for joint pain.    DRUG ALLERGIES:  No Known Allergies  VITALS:  Blood pressure 157/48, pulse 71, temperature 98.9 F (37.2 C), temperature source Oral, resp. rate 18, height 5\' 6"  (1.676 m), weight 97.977 kg (216 lb), SpO2 97 %.  PHYSICAL EXAMINATION:  GENERAL:  81 y.o.-year-old patient lying in the bed with no acute distress.  EYES: Pupils equal, round, reactive to light and accommodation. No scleral icterus. Extraocular muscles intact.  HEENT: Head atraumatic, normocephalic. Oropharynx and nasopharynx clear.  NECK:  Supple, no jugular venous distention. No thyroid enlargement, no tenderness.  LUNGS: Normal breath sounds bilaterally, no wheezing, rales,rhonchi or crepitation. No use of accessory muscles of respiration.  CARDIOVASCULAR: S1, S2 normal. No murmurs, rubs, or gallops.  ABDOMEN: Soft, nontender, nondistended. Bowel sounds present. No organomegaly or mass.  EXTREMITIES: No pedal edema, cyanosis, or clubbing. Very reluctant to move the right hip, pain in right hip with any movement of the right leg. NEUROLOGIC: Cranial nerves II through XII are intact.  PSYCHIATRIC: The patient is alert and oriented x 3.  SKIN: No obvious rash, lesion, or ulcer.    LABORATORY PANEL:   CBC  Recent Labs Lab 07/29/15 1059  WBC 3.8  HGB 10.9*  HCT 33.6*  PLT 186    ------------------------------------------------------------------------------------------------------------------  Chemistries   Recent Labs Lab 07/30/15 0447  NA 143  K 4.2  CL 115*  CO2 24  GLUCOSE 162*  BUN 20  CREATININE 1.63*  CALCIUM 8.3*  AST 48*  ALT 48  ALKPHOS 95  BILITOT 0.9   ------------------------------------------------------------------------------------------------------------------  Cardiac Enzymes  Recent Labs Lab 07/29/15 1059  TROPONINI <0.03   ------------------------------------------------------------------------------------------------------------------  RADIOLOGY:  Dg Chest 1 View  07/29/2015   CLINICAL DATA:  81 year old female with stroke like symptoms, hypoglycemia on EMS arrival. Initial encounter.  EXAM: CHEST 1 VIEW  COMPARISON:  11/04/2013 and earlier.  FINDINGS: Portable AP upright view at 1051 hours. Stable cardiac size and mediastinal contours. Stable lung volumes. No pneumothorax or pulmonary edema. No pleural effusion or consolidation. Mild retrocardiac patchy opacity most resembles atelectasis.  IMPRESSION: Mild left lung base atelectasis suspected. Otherwise, no acute cardiopulmonary abnormality.   Electronically Signed   By: Genevie Ann M.D.   On: 07/29/2015 11:01   US Venous Img Lower Unilateral Right  07/28/2015   CLINICAL DATA:  RIGHT leg pain, edema, color change, and calf tenderness. Symptom duration unspecified.  EXAM: RIGHT LOWER EXTREMITY VENOUS DOPPLER ULTRASOUND  TECHNIQUE: Gray-scale sonography with graded compression, as well as color Doppler and duplex ultrasound were performed to evaluate the lower extremity deep venous systems from the level of the common femoral vein and including the common femoral, femoral, profunda femoral, popliteal and calf veins including the posterior tibial, peroneal and gastrocnemius veins when visible. The superficial great saphenous vein was also interrogated. Spectral Doppler was utilized to  evaluate flow at rest and with distal  augmentation maneuvers in the common femoral, femoral and popliteal veins.  COMPARISON:  None.  FINDINGS: Contralateral Common Femoral Vein: Respiratory phasicity is normal and symmetric with the symptomatic side. No evidence of thrombus. Normal compressibility.  Common Femoral Vein: No evidence of thrombus. Normal compressibility, respiratory phasicity and response to augmentation.  Saphenofemoral Junction: No evidence of thrombus. Normal compressibility and flow on color Doppler imaging.  Profunda Femoral Vein: No evidence of thrombus. Normal compressibility and flow on color Doppler imaging.  Femoral Vein: No evidence of thrombus. Normal compressibility, respiratory phasicity and response to augmentation.  Popliteal Vein: No evidence of thrombus. Normal compressibility, respiratory phasicity and response to augmentation.  Calf Veins: No evidence of thrombus. Normal compressibility and flow on color Doppler imaging.  Superficial Great Saphenous Vein: No evidence of thrombus. Normal compressibility and flow on color Doppler imaging.  Venous Reflux:  None.  Other Findings:  Somewhat limited exam due to body habitus.  IMPRESSION: No evidence of RIGHT lower extremity deep venous thrombosis.   Electronically Signed   By: Staci Righter M.D.   On: 07/28/2015 17:08    EKG:   Orders placed or performed during the hospital encounter of 07/29/15  . ED EKG  . ED EKG    ASSESSMENT AND PLAN:   1) hypoglycemia: resolved - dc dextrose - due to glimipride, unclear from patient history if dose has been changed recently.  - cbg now elevated, continue SSI, check [id number]  2) metabolic encephalopathy: resolved  3) hypertension: - BP slightly up, metoprolol stopped due to bradycardia  4) hip pain - PT evaluation pending - exam suggestive of trochanteric bursitis - repeat xray to see if fracture has shown up, may need NRI - 3rd ED/hospital visit in past weeks  5) CKD3: -  stable  CODE STATUS: full   TOTAL TIME TAKING CARE OF THIS PATIENT: 30 minutes.  Greater than 50% of time spent in care coordination and counseling. Care plan discussed with the patient at the bedside. POSSIBLE D/C IN 1-2 DAYS, DEPENDING ON CLINICAL CONDITION.   Myrtis Ser M.D on 07/30/2015 at 12:47 PM  Between 7am to 6pm - Pager - [phone]  After 6pm go to [email or web address] - password [email or web address] [facility]  Office  [phone]  CC: Primary care physician; Perrin Maltese, MD

## 2015-07-30 NOTE — Plan of Care (Signed)
Problem: Discharge Progression Outcomes Goal: Other Discharge Outcomes/Goals Outcome: Progressing Patient had some right hip pain early in shift but declined prn pain meds  Blood sugars stable this shift Patient is tolerating diet well Patient is NPO after midnight tonight for possible surgery tomorrow  Daughter at bedside on and off during shift

## 2015-07-30 NOTE — Care Management Important Message (Signed)
Important Message  Patient Details  Name: Denise [name] MRN: [id number] Date of Birth: November 29, 1929   Medicare Important Message Given:  Yes-second notification given    Shelbie Ammons, RN 07/30/2015, 8:23 AM

## 2015-07-30 NOTE — Evaluation (Signed)
Physical Therapy Evaluation Patient Details Name: Denise [name] MRN: [id number] DOB: 02-03-1930 Today's Date: 07/30/2015   History of Present Illness  Pt was admitted to the hospital due to low blood glucose with metabolic encephalopathy which has since resolved. Pt also with R hip pain and difficulty ambulating. Pt reports that R hip pain started 4 months ago and has progressively worsened. Pt has seen multiple physicians and had an MRI scheduled for Sunday. Daughter is present and reports she has been helping her mother with basic transfers and pushing her on rollator seat to the bathroom. Pt sufferred a fall approximately 2 weeks ago but states she fell on her L side but did not suffer injury. Pt denies additional falls over the last 12 months  Clinical Impression  Pt demonstrates severe bilateral LE weakness with transfers and difficulty accepting weight on RLE due to weakness/pain. Pt with RLE buckling in standing and antalgic gait for forward/backward stepping at EOB. Pt with RLE buckling and requires modA+2 for safety. Unsafe to attempt further ambulation at this time. Pt is unsafe to return to her apartment at discharge and will need SNF placement in order to restore strength and mobility for safe transfers and ambulation at home. Pt will benefit from skilled PT services to address deficits in strength, balance, and mobility in order to return to full function at home.     Follow Up Recommendations SNF    Equipment Recommendations  Rolling walker with 5" wheels (To be arranged at SNF)    Recommendations for Other Services       Precautions / Restrictions Precautions Precautions: Fall Restrictions Weight Bearing Restrictions: No      Mobility  Bed Mobility Overal bed mobility: Needs Assistance Bed Mobility: Supine to Sit;Sit to Supine     Supine to sit: Mod assist Sit to supine: Min assist   General bed mobility comments: Pt with R hip weakness and pain with bed mobility.  Generalized deconditioning evident with assist for UE to pull to sitting and increased time to attain independent static balance  Transfers Overall transfer level: Needs assistance Equipment used: Rolling walker (2 wheeled) Transfers: Sit to/from Stand Sit to Stand: Mod assist;+2 physical assistance         General transfer comment: Pt with poor LE strength and increased RLE pain with sit to stand. RLE buckling in standing but once upright is able to remain standing with minA+1 only. Poor weight acceptance to RLE  Ambulation/Gait Ambulation/Gait assistance: Mod assist;+2 physical assistance Ambulation Distance (Feet): 3 Feet Assistive device: Rolling walker (2 wheeled) Gait Pattern/deviations: Antalgic;Decreased step length - right;Decreased step length - left;Decreased stance time - right   Gait velocity interpretation: <1.8 ft/sec, indicative of risk for recurrent falls General Gait Details: Pt takes short steps forward/backward with walker and modA+2. Increased RLE pain with WB and decreased weight shifting noted. Pt with RLE buckling with gait and is very unstable. Pt is high risk for falls and is unsafe to continue further ambulation  Stairs            Wheelchair Mobility    Modified Rankin (Stroke Patients Only)       Balance Overall balance assessment: Needs assistance   Sitting balance-Leahy Scale: Fair       Standing balance-Leahy Scale: Fair                               Pertinent Vitals/Pain Pain Assessment: 0-10 Pain  Score: 8  Pain Location: R hip Pain Intervention(s): Limited activity within patient's tolerance;Monitored during session    [place] expects to be discharged to:: Private residence Living Arrangements: Alone Available Help at Discharge: Family Type of Home: Apartment Home Access: [place]: One level Home Equipment: Environmental consultant - 4 wheels;Shower seat;Grab bars - tub/shower (no BSC)       Prior Function Level of Independence: Needs assistance   Gait / Transfers Assistance Needed: Daugther pushing pt on rollator to and from bathroom  ADL's / Homemaking Assistance Needed: Pt reports independence with bathing/dressing  Comments: Daugther assists with IADLs     Hand Dominance        Extremity/Trunk Assessment   Upper Extremity Assessment: Overall WFL for tasks assessed           Lower Extremity Assessment: RLE deficits/detail RLE Deficits / Details: LLE strength is at least 4/5 throughout. R hip and knee strength difficulty to assess due to R hip pain with resistance       Communication   Communication: No difficulties  Cognition Arousal/Alertness: Awake/alert Behavior During Therapy: WFL for tasks assessed/performed Overall Cognitive Status: Within Functional Limits for tasks assessed                      General Comments      Exercises        Assessment/Plan    PT Assessment Patient needs continued PT services  PT Diagnosis Difficulty walking;Abnormality of gait;Generalized weakness;Acute pain   PT Problem List Decreased strength;Decreased activity tolerance;Decreased balance;Decreased mobility;Decreased safety awareness;Pain  PT Treatment Interventions DME instruction;Gait training;Functional mobility training;Therapeutic activities;Therapeutic exercise;Balance training;Neuromuscular re-education;Patient/family education   PT Goals (Current goals can be found in the Care Plan section) Acute Rehab PT Goals Patient Stated Goal: "I'm really weak and my leg hurts. I want to be able to walk" PT Goal Formulation: With patient/family Potential to Achieve Goals: Good    Frequency Min 2X/week   Barriers to discharge Decreased caregiver support Pt requiring heavy assist from daugther and is unsafe to be at home alone    Co-evaluation               End of Session Equipment Utilized During Treatment: Gait belt Activity Tolerance:  Patient tolerated treatment well Patient left: in bed;with call bell/phone within reach;with bed alarm set;with family/visitor present           Time: 1310-1333 PT Time Calculation (min) (ACUTE ONLY): 23 min   Charges:   PT Evaluation $Initial PT Evaluation Tier I: 1 Procedure     PT G Codes:       Lyndel Safe Huprich PT, DPT   Huprich,Jason 07/30/2015, 2:55 PM

## 2015-07-30 NOTE — Consult Note (Signed)
ORTHOPAEDIC CONSULTATION  REQUESTING PHYSICIAN: Aldean Jewett, MD  Chief Complaint:   Right hip pain.  History of Present Illness: Denise [name] is a 81 y.o. female with a history of diabetes, hypertension, COPD, and emphysema who lives at home alone. Apparently, she fell at her house 8 days ago, landing on her right side. She presented to the emergency room following a fall where x-rays of the hip were obtained these films show no evidence for a fracture, so a CT scan was obtained. This also showed no evidence for a fracture. Therefore, she went home where she experienced continued pain and difficulty ambulating. Yesterday morning, she was found unresponsive by her daughter and her glucose was quite low at 20. She was brought to the emergency room and admitted for further workup of her hypoglycemic episode. In addition, repeat x-rays of her right hip were obtained today because of her continued pain. These showed an essentially nondisplaced right femoral neck fracture.  Past Medical History  Diagnosis Date  . Diabetes mellitus without complication ([place])   . Hypertension   . Arthritis   . Cataract   . COPD (chronic obstructive pulmonary disease) ([place])   . Emphysema of lung [facility])    Past Surgical History  Procedure Laterality Date  . Gallbladder surgery    . Cholecystectomy     Social History   Social History  . Marital Status: Widowed    Spouse Name: N/A  . Number of Children: N/A  . Years of Education: N/A   Social History Main Topics  . Smoking status: Former Smoker    Quit date: 07/28/2013  . Smokeless tobacco: None  . Alcohol Use: No  . Drug Use: None  . Sexual Activity: No   Other Topics Concern  . None   Social History Narrative   Family History  Problem Relation Age of Onset  . Diabetes Mother   . Heart attack Mother   . Stomach cancer Father   . Colon cancer Sister   . Diabetes Sister   .  Heart attack Brother   . Colon cancer Brother    No Known Allergies Prior to Admission medications   Medication Sig Start Date End Date Taking? Authorizing Provider  atorvastatin (LIPITOR) 40 MG tablet Take 40 mg by mouth at bedtime.    Yes Historical Provider, MD  fenofibrate (TRICOR) 145 MG tablet Take 145 mg by mouth daily.   Yes Historical Provider, MD  Fluticasone Furoate-Vilanterol (BREO ELLIPTA) 100-25 MCG/INH AEPB Inhale 1 puff into the lungs daily.   Yes Historical Provider, MD  glimepiride (AMARYL) 4 MG tablet Take 4 mg by mouth 2 (two) times daily.   Yes Historical Provider, MD  levothyroxine (SYNTHROID, LEVOTHROID) 137 MCG tablet Take 137 mcg by mouth daily.    Yes Historical Provider, MD  LORazepam (ATIVAN) 1 MG tablet Take 1 mg by mouth once. 1 hour before procedure   Yes Historical Provider, MD  meloxicam (MOBIC) 7.5 MG tablet Take 7.5 mg by mouth 2 (two) times daily after a meal.    Yes Historical Provider, MD  metoprolol tartrate (LOPRESSOR) 25 MG tablet Take 25 mg by mouth 2 (two) times daily.   Yes Historical Provider, MD  oxyCODONE-acetaminophen (PERCOCET/ROXICET) 5-325 MG tablet Take 1-2 tablets by mouth every 4 (four) hours as needed for severe pain. Patient taking differently: Take 1 tablet by mouth every morning.  07/24/15  Yes Shari Upstill, PA-C  pantoprazole (PROTONIX) 40 MG tablet Take 40 mg by mouth daily.  Yes Historical Provider, MD  tiotropium (SPIRIVA) 18 MCG inhalation capsule Place 18 mcg into inhaler and inhale daily.   Yes Historical Provider, MD  traMADol (ULTRAM) 50 MG tablet Take 1 tablet (50 mg total) by mouth every 12 (twelve) hours as needed for severe pain. Patient taking differently: Take 50 mg by mouth 2 (two) times daily.  07/21/15 07/20/16 Yes Schuyler Amor, MD  valsartan (DIOVAN) 160 MG tablet Take 160 mg by mouth daily.   Yes Historical Provider, MD  verapamil (CALAN-SR) 240 MG CR tablet Take 240 mg by mouth at bedtime.   Yes Historical Provider,  MD   Dg Chest 1 View  07/29/2015   CLINICAL DATA:  81 year old female with stroke like symptoms, hypoglycemia on EMS arrival. Initial encounter.  EXAM: CHEST 1 VIEW  COMPARISON:  11/04/2013 and earlier.  FINDINGS: Portable AP upright view at 1051 hours. Stable cardiac size and mediastinal contours. Stable lung volumes. No pneumothorax or pulmonary edema. No pleural effusion or consolidation. Mild retrocardiac patchy opacity most resembles atelectasis.  IMPRESSION: Mild left lung base atelectasis suspected. Otherwise, no acute cardiopulmonary abnormality.   Electronically Signed   By: Genevie Ann M.D.   On: 07/29/2015 11:01   Dg Hip Unilat With Pelvis 2-3 Views Right  07/30/2015   ADDENDUM REPORT: 07/30/2015 15:45 ADDENDUM: These results were called by telephone at the time of interpretation on 07/30/2015 at 3:45 pm to Dr. Myrtis Ser , who verbally acknowledged these results. Electronically Signed   By: Franki Cabot M.D.   On: 07/30/2015 15:45  07/30/2015   CLINICAL DATA:  Fall over 1 week ago, right hip pain  EXAM: DG HIP (WITH OR WITHOUT PELVIS) 2-3V RIGHT  COMPARISON:  None.  FINDINGS: There is a slightly displaced right femoral neck fracture, best seen in the superior-lateral subcapital region, but with likely extension into the more central aspects of the right femoral neck. Right femoral head remains well positioned relative to the acetabulum. No fracture seen within the adjacent right hemipelvis.  IMPRESSION: Slightly displaced fracture within the right femoral neck, best seen at the superior-lateral subcapital region but with likely extension into the more central aspects of the right femoral neck.  Electronically Signed: By: Franki Cabot M.D. On: 07/30/2015 15:39    Positive ROS: All other systems have been reviewed and were otherwise negative with the exception of those mentioned in the HPI and as above.  Physical Exam: General:  Alert, no acute distress Psychiatric:  Patient is competent  for consent with normal mood and affect   Cardiovascular:  No pedal edema Respiratory:  No wheezing, non-labored breathing GI:  Abdomen is soft and non-tender Skin:  No lesions in the area of chief complaint Neurologic:  Sensation intact distally Lymphatic:  No axillary or cervical lymphadenopathy  Orthopedic Exam:  Orthopedic examination is limited to the right hip and lower extremity. Skin inspection of the right hip area is unremarkable. The patient's right leg is held in a slightly flexed and abducted position. She has pain with attempted active or passive motion of the hip. She is able to actively flex and extend her knee, ankle, foot, and toes. She has intact sensation to light touch to all distributions. She has good capillary refill to her right foot.  X-rays:  X-rays of the pelvis and right hip are available for review. These films demonstrate an essentially nondisplaced right femoral neck fracture with slight varus angulation. There is at most minimal degenerative changes with overall satisfactory maintenance of  the clear space. No lytic lesions or other abnormalities are identified.  Assessment: Right hip pain.  Plan: The treatment options were discussed with the patient and her daughter, specifically a right hip hemiarthroplasty. This procedure was discussed in detail, as were the potential risks (including bleeding, infection, nerve and/or blood vessel injury, persistent or recurrent pain, loosening of and/or failure of the components, dislocation, leg length inequality, need for further surgery, blood clots, strokes, heart attacks and/or arrhythmias, etc.) and benefits. The patient states her understanding and wishes to proceed. A signed consent will be obtained by the nursing staff. We will plan on trying to do this procedure tomorrow around 11 AM.  Thank you for asking me to participate in the care of this most pleasant woman. I will be happy to follow her with you.   Pascal Lux, MD  Beeper #:  [phone]  07/30/2015 7:09 PM

## 2015-07-30 NOTE — Clinical Social Work Note (Signed)
Clinical Social Work Assessment  Patient Details  Name: Denise [name] MRN: [id number] Date of Birth: 12-Jul-1930  Date of referral:  07/30/15               Reason for consult:  Facility Placement                Permission sought to share information with:  Facility Art therapist granted to share information::  Yes, Verbal Permission Granted  Name::        Agency::     Relationship::     Contact Information:     Housing/Transportation Living arrangements for the past 2 months:  Apartment Source of Information:  Patient Patient Interpreter Needed:  None Criminal Activity/Legal Involvement Pertinent to Current Situation/Hospitalization:  No - Comment as needed Significant Relationships:  Adult Children Lives with:  Self Do you feel safe going back to the place where you live?  Yes Need for family participation in patient care:  Yes (Comment)  Care giving concerns:  Patient resides alone at an [facility].    Social Worker assessment / plan:  CSW met with patient who is alert and oriented X4 and was pleasant to talk with. CSW spoke to patient about PT recommendations for STR. Patient stated that her daughter is her only support system and she lives 15-20 minutes away. Patient states that she had declined with her hip over the past 2 months and that she is willing to go to rehab. CSW explained the placement process and will initiate a bedsearch.   Employment status:    Nurse, adult PT Recommendations:  [place] / Referral to community resources:  [place]  Patient/Family's Response to care:  Patient expressed appreciation for CSW assistance.   Patient/Family's Understanding of and Emotional Response to Diagnosis, Current Treatment, and Prognosis:  Patient stated she just found out she was to have surgery tomorrow but is wanting to find out additional information about this. She is hoping  that the surgery will help the pain in her hip.  Emotional Assessment Appearance:  Appears stated age Attitude/Demeanor/Rapport:   (pleasant and cooperative) Affect (typically observed):  Accepting, Adaptable, Appropriate Orientation:  Oriented to Self, Oriented to Place, Oriented to  Time, Oriented to Situation Alcohol / Substance use:  Not Applicable Psych involvement (Current and /or in the community):  No (Comment)  Discharge Needs  Concerns to be addressed:  Care Coordination Readmission within the last 30 days:  No Current discharge risk:  None Barriers to Discharge:  No Barriers Identified   Shela Leff, LCSW 07/30/2015, 4:25 PM

## 2015-07-30 NOTE — Progress Notes (Signed)
Anticoagulation monitoring(Lovenox):  81yo  ordered Lovenox 30 mg Q24h due to Crcl < 30 ml/min  Filed Weights   07/29/15 1054  Weight: 216 lb (97.977 kg)  BMI 34.9  Lab Results  Component Value Date   CREATININE 1.63* 07/30/2015   CREATININE 1.78* 07/29/2015   CREATININE 2.14* 07/24/2015   Estimated Creatinine Clearance: 31.4 mL/min (by C-G formula based on Cr of 1.63). Hemoglobin & Hematocrit     Component Value Date/Time   HGB 10.9* 07/29/2015 1059   HGB 11.5* 11/05/2013 0437   HCT 33.6* 07/29/2015 1059   HCT 34.5* 11/05/2013 0437    Renal fxn has improved,  Per Protocol for Patient with estCrcl> 30 ml/min and BMI < 40, will transition to Lovenox 40 mg Q24h     Chinita Greenland PharmD Clinical Pharmacist 07/30/2015 11:34 AM

## 2015-07-30 NOTE — Progress Notes (Signed)
Initial Nutrition Assessment   INTERVENTION:   Meals and Snacks: Cater to patient preferences Education: RD provided "Carbohydrate Counting for People with Diabetes" handout from the Academy of Nutrition and Dietetics. Discussed different food groups and their effects on blood sugar, emphasizing carbohydrate-containing foods. Provided list of carbohydrates and recommended serving sizes of common foods. Discussed importance of controlled and consistent carbohydrate intake throughout the day. Provided examples of ways to balance meals/snacks and encouraged intake of high-fiber, whole grain complex carbohydrates. Teach back method used. Encouraged pt to discuss education with daughter and provide daughter with materials given as well. Expect good compliance. Medical Food Supplement Therapy: if po intake poor on follow pt may benefit from supplement such as Glucerna   NUTRITION DIAGNOSIS:   Food and nutrition related knowledge deficit related to chronic illness as evidenced by per patient/family report.  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   Past Medical History  Diagnosis Date  . Diabetes mellitus without complication ([place])   . Hypertension   . Arthritis   . Cataract   . COPD (chronic obstructive pulmonary disease) ([place])   . Emphysema of lung ([place])      Diet Order:  Diet heart healthy/carb modified Room service appropriate?: Yes; Fluid consistency:: Thin    Current Nutrition: Pt reports not wanting to eat lunch today did not have an appetite. Pt reports eating eggs with home fries this am at breakfast. Recorded 40% of lunch and 100% of breakfast this am.  Food/Nutrition-Related History: Pt reports usual intake of grits with cheese and SPAM  meat for breakfast and then will eat sandwich for lunch and dinner pt likes grilled chicken or SPAM again. Pt reports eating some vegetables. Pt reports liking fruit. Pt reports eating one cookie when 'I feel my sugars are low,' difficult to clarify how often that is. Pt reports drinking water mostly but will sometimes have Diet Mt. Dew.    Medications: Protonix, SS Novolog  Electrolyte/Renal Profile and Glucose Profile:   Recent Labs Lab 07/24/15 2049 07/29/15 1059 07/30/15 0447  NA 139 144 143  K 3.7 3.7 4.2  CL 107 112* 115*  CO2 25 26 24   BUN 24* 23* 20  CREATININE 2.14* 1.78* 1.63*  CALCIUM 8.9 8.7* 8.3*  GLUCOSE 77 85 162*   Protein Profile:  Recent Labs Lab 07/24/15 2049 07/29/15 1059 07/30/15 0447  ALBUMIN 3.1* 2.9* 2.4*    Gastrointestinal Profile: Last BM:  07/30/2015   Weight Change: Pt reports stable weight. Per [facility] some weight gain possible.   Skin:  Reviewed, no issues   Height:   Ht Readings from Last 1 Encounters:  07/29/15 5\' 6"  (1.676 m)    Weight:   Wt Readings from Last 1 Encounters:  07/29/15 216 lb (97.977 kg)    Wt Readings from Last 10 Encounters:  07/29/15 216 lb (97.977 kg)  07/21/15 190 lb (86.183 kg)     BMI:  Body mass index is 34.88 kg/(m^2).   EDUCATION NEEDS:   Education needs addressed    LOW Care Level  Dwyane Luo, [place], LDN Pager [phone]

## 2015-07-31 ENCOUNTER — Encounter: Payer: Self-pay | Admitting: Anesthesiology

## 2015-07-31 ENCOUNTER — Encounter
Admission: EM | Disposition: A | Discharge status: Skilled Nursing Facility | Payer: Self-pay | Source: Home / Self Care | Attending: Internal Medicine

## 2015-07-31 ENCOUNTER — Inpatient Hospital Stay: Payer: Medicare Other | Admitting: Anesthesiology

## 2015-07-31 ENCOUNTER — Inpatient Hospital Stay: Payer: Medicare Other

## 2015-07-31 HISTORY — PX: HIP ARTHROPLASTY: SHX981

## 2015-07-31 LAB — GLUCOSE, CAPILLARY
Glucose-Capillary: 118 mg/dL — ABNORMAL HIGH (ref 65–99)
Glucose-Capillary: 126 mg/dL — ABNORMAL HIGH (ref 65–99)
Glucose-Capillary: 143 mg/dL — ABNORMAL HIGH (ref 65–99)

## 2015-07-31 SURGERY — HEMIARTHROPLASTY, HIP, DIRECT ANTERIOR APPROACH, FOR FRACTURE
Anesthesia: General | Laterality: Right

## 2015-07-31 MED ORDER — MIDAZOLAM HCL 5 MG/5ML IJ SOLN
INTRAMUSCULAR | Status: DC | PRN
  Administered 2015-07-31 (×2): 1 mg via INTRAVENOUS

## 2015-07-31 MED ORDER — NEOMYCIN-POLYMYXIN B GU 40-200000 IR SOLN
Status: DC | PRN
  Administered 2015-07-31: 16 mL

## 2015-07-31 MED ORDER — ENOXAPARIN SODIUM 40 MG/0.4ML ~~LOC~~ SOLN
40.0000 mg | SUBCUTANEOUS | Status: DC
  Administered 2015-08-01 – 2015-08-03 (×3): 40 mg via SUBCUTANEOUS
  Filled 2015-07-31 (×5): qty 0.4

## 2015-07-31 MED ORDER — BUPIVACAINE LIPOSOME 1.3 % IJ SUSP
INTRAMUSCULAR | Status: DC | PRN
  Administered 2015-07-31: 20 mL

## 2015-07-31 MED ORDER — METOCLOPRAMIDE HCL 5 MG/ML IJ SOLN: 5.0000 mg | Freq: Three times a day (TID) | INTRAMUSCULAR | Status: DC | PRN

## 2015-07-31 MED ORDER — DIPHENHYDRAMINE HCL 12.5 MG/5ML PO ELIX
12.5000 mg | ORAL_SOLUTION | ORAL | Status: DC | PRN
  Administered 2015-08-02: 12.5 mg via ORAL
  Filled 2015-07-31: qty 5

## 2015-07-31 MED ORDER — METOCLOPRAMIDE HCL 5 MG PO TABS: 5.0000 mg | ORAL_TABLET | Freq: Three times a day (TID) | ORAL | Status: DC | PRN

## 2015-07-31 MED ORDER — PROPOFOL 500 MG/50ML IV EMUL
INTRAVENOUS | Status: DC | PRN
  Administered 2015-07-31: 25 ug/kg/min via INTRAVENOUS

## 2015-07-31 MED ORDER — KCL IN DEXTROSE-NACL 20-5-0.9 MEQ/L-%-% IV SOLN
INTRAVENOUS | Status: DC
  Administered 2015-07-31 – 2015-08-02 (×2): via INTRAVENOUS
  Filled 2015-07-31 (×6): qty 1000

## 2015-07-31 MED ORDER — CEFAZOLIN SODIUM-DEXTROSE 2-3 GM-% IV SOLR
2.0000 g | Freq: Four times a day (QID) | INTRAVENOUS | Status: AC
  Administered 2015-07-31 – 2015-08-01 (×3): 2 g via INTRAVENOUS
  Filled 2015-07-31 (×3): qty 50

## 2015-07-31 MED ORDER — BUPIVACAINE LIPOSOME 1.3 % IJ SUSP
INTRAMUSCULAR | Status: AC
  Filled 2015-07-31: qty 20

## 2015-07-31 MED ORDER — HYDROMORPHONE HCL 1 MG/ML IJ SOLN
0.5000 mg | INTRAMUSCULAR | Status: DC | PRN
  Administered 2015-07-31: 1 mg via INTRAVENOUS
  Administered 2015-07-31: 0.5 mg via INTRAVENOUS
  Filled 2015-07-31 (×2): qty 1

## 2015-07-31 MED ORDER — SODIUM CHLORIDE 0.9 % IJ SOLN
INTRAMUSCULAR | Status: AC
  Filled 2015-07-31: qty 50

## 2015-07-31 MED ORDER — OXYCODONE HCL 5 MG PO TABS
5.0000 mg | ORAL_TABLET | ORAL | Status: DC | PRN
  Administered 2015-08-01 (×3): 10 mg via ORAL
  Administered 2015-08-01: 5 mg via ORAL
  Administered 2015-08-02: 10 mg via ORAL
  Administered 2015-08-03: 5 mg via ORAL
  Filled 2015-07-31: qty 2
  Filled 2015-07-31 (×2): qty 1
  Filled 2015-07-31 (×3): qty 2

## 2015-07-31 MED ORDER — PHENYLEPHRINE HCL 10 MG/ML IJ SOLN
INTRAMUSCULAR | Status: DC | PRN
  Administered 2015-07-31: 100 ug via INTRAVENOUS
  Administered 2015-07-31: 50 ug via INTRAVENOUS

## 2015-07-31 MED ORDER — PROPOFOL 10 MG/ML IV BOLUS
INTRAVENOUS | Status: DC | PRN
  Administered 2015-07-31: 30 mg via INTRAVENOUS

## 2015-07-31 MED ORDER — TRANEXAMIC ACID 1000 MG/10ML IV SOLN
INTRAVENOUS | Status: AC
  Filled 2015-07-31: qty 10

## 2015-07-31 MED ORDER — NEOMYCIN-POLYMYXIN B GU 40-200000 IR SOLN
Status: AC
  Filled 2015-07-31: qty 20

## 2015-07-31 MED ORDER — LACTATED RINGERS IV SOLN
INTRAVENOUS | Status: DC | PRN
  Administered 2015-07-31: 12:00:00 via INTRAVENOUS

## 2015-07-31 MED ORDER — TRANEXAMIC ACID 1000 MG/10ML IV SOLN
INTRAVENOUS | Status: DC | PRN
  Administered 2015-07-31: 1000 mg

## 2015-07-31 MED ORDER — BISACODYL 10 MG RE SUPP
10.0000 mg | Freq: Every day | RECTAL | Status: DC | PRN
  Administered 2015-08-03: 10 mg via RECTAL
  Filled 2015-07-31: qty 1

## 2015-07-31 MED ORDER — PHENYLEPHRINE HCL 10 MG/ML IJ SOLN
INTRAMUSCULAR | Status: AC
  Filled 2015-07-31: qty 1

## 2015-07-31 MED ORDER — BUPIVACAINE-EPINEPHRINE (PF) 0.5% -1:200000 IJ SOLN
INTRAMUSCULAR | Status: AC
  Filled 2015-07-31: qty 30

## 2015-07-31 MED ORDER — DOCUSATE SODIUM 100 MG PO CAPS
100.0000 mg | ORAL_CAPSULE | Freq: Two times a day (BID) | ORAL | Status: DC
  Administered 2015-07-31 – 2015-08-03 (×6): 100 mg via ORAL
  Filled 2015-07-31 (×6): qty 1

## 2015-07-31 MED ORDER — MAGNESIUM HYDROXIDE 400 MG/5ML PO SUSP
30.0000 mL | Freq: Every day | ORAL | Status: DC | PRN
  Administered 2015-08-02: 30 mL via ORAL
  Filled 2015-07-31: qty 30

## 2015-07-31 MED ORDER — FLEET ENEMA 7-19 GM/118ML RE ENEM: 1.0000 | ENEMA | Freq: Once | RECTAL | Status: DC | PRN

## 2015-07-31 MED ORDER — FENTANYL CITRATE (PF) 100 MCG/2ML IJ SOLN
INTRAMUSCULAR | Status: DC | PRN
  Administered 2015-07-31 (×2): 50 ug via INTRAVENOUS

## 2015-07-31 MED ORDER — BUPIVACAINE-EPINEPHRINE (PF) 0.25% -1:200000 IJ SOLN
INTRAMUSCULAR | Status: DC | PRN
  Administered 2015-07-31: 30 mL via PERINEURAL

## 2015-07-31 MED ORDER — BUPIVACAINE-EPINEPHRINE (PF) 0.25% -1:200000 IJ SOLN
INTRAMUSCULAR | Status: AC
  Filled 2015-07-31: qty 30

## 2015-07-31 SURGICAL SUPPLY — 52 items
BAG DECANTER FOR FLEXI CONT (MISCELLANEOUS) ×1 IMPLANT
BLADE SAGITTAL WIDE XTHICK NO (BLADE) ×2 IMPLANT
BLADE SURG SZ20 CARB STEEL (BLADE) ×2 IMPLANT
BNDG COHESIVE 6X5 TAN STRL LF (GAUZE/BANDAGES/DRESSINGS) ×2 IMPLANT
BOWL CEMENT MIXING ADV NOZZLE (MISCELLANEOUS) ×2 IMPLANT
CANISTER SUCT 1200ML W/VALVE (MISCELLANEOUS) ×2 IMPLANT
CAPT HIP HEMI 2 ×1 IMPLANT
CHLORAPREP W/TINT 26ML (MISCELLANEOUS) ×4 IMPLANT
DECANTER SPIKE VIAL GLASS SM (MISCELLANEOUS) ×2 IMPLANT
DRAPE IMP U-DRAPE 54X76 (DRAPES) ×4 IMPLANT
DRAPE INCISE IOBAN 66X60 STRL (DRAPES) ×2 IMPLANT
DRAPE SHEET LG 3/4 BI-LAMINATE (DRAPES) ×2 IMPLANT
DRAPE SURG 17X23 STRL (DRAPES) ×2 IMPLANT
DRSG OPSITE POSTOP 4X12 (GAUZE/BANDAGES/DRESSINGS) ×2 IMPLANT
DRSG OPSITE POSTOP 4X14 (GAUZE/BANDAGES/DRESSINGS) ×2 IMPLANT
ELECT BLADE 6.5 EXT (BLADE) ×2 IMPLANT
ELECT CAUTERY BLADE 6.4 (BLADE) ×2 IMPLANT
GAUZE PACK 2X3YD (MISCELLANEOUS) ×2 IMPLANT
GLOVE BIO SURGEON STRL SZ8 (GLOVE) ×4 IMPLANT
GLOVE INDICATOR 8.0 STRL GRN (GLOVE) ×2 IMPLANT
GOWN STRL REUS W/ TWL LRG LVL3 (GOWN DISPOSABLE) ×1 IMPLANT
GOWN STRL REUS W/ TWL XL LVL3 (GOWN DISPOSABLE) ×1 IMPLANT
GOWN STRL REUS W/TWL LRG LVL3 (GOWN DISPOSABLE) ×2
GOWN STRL REUS W/TWL XL LVL3 (GOWN DISPOSABLE) ×2
HANDPIECE SUCTION TUBG SURGILV (MISCELLANEOUS) ×2 IMPLANT
IV NS 100ML SINGLE PACK (IV SOLUTION) ×2 IMPLANT
NDL FILTER BLUNT 18X1 1/2 (NEEDLE) ×1 IMPLANT
NDL SAFETY 18GX1.5 (NEEDLE) ×2 IMPLANT
NDL SPNL 20GX3.5 QUINCKE YW (NEEDLE) ×1 IMPLANT
NEEDLE FILTER BLUNT 18X 1/2SAF (NEEDLE) ×1
NEEDLE FILTER BLUNT 18X1 1/2 (NEEDLE) ×1 IMPLANT
NEEDLE SPNL 20GX3.5 QUINCKE YW (NEEDLE) ×2 IMPLANT
NS IRRIG 1000ML POUR BTL (IV SOLUTION) ×2 IMPLANT
PACK HIP PROSTHESIS (MISCELLANEOUS) ×2 IMPLANT
PAD GROUND ADULT SPLIT (MISCELLANEOUS) ×2 IMPLANT
PILLOW ABDUC SM (MISCELLANEOUS) ×2 IMPLANT
STAPLER SKIN PROX 35W (STAPLE) ×2 IMPLANT
STRAP SAFETY BODY (MISCELLANEOUS) ×2 IMPLANT
SUT ETHIBOND #5 BRAIDED 30INL (SUTURE) ×2 IMPLANT
SUT ETHIBOND 2 V 37 (SUTURE) ×4 IMPLANT
SUT ETHIBOND CT1 BRD #0 30IN (SUTURE) ×2 IMPLANT
SUT QUILL PDO 2 24X24 VLT (SUTURE) ×2 IMPLANT
SUT VIC AB 0 CT1 27 (SUTURE) ×4
SUT VIC AB 0 CT1 27XCR 8 STRN (SUTURE) ×2 IMPLANT
SUT VIC AB 1 CT1 36 (SUTURE) ×2 IMPLANT
SUT VIC AB 2-0 CT1 27 (SUTURE) ×4
SUT VIC AB 2-0 CT1 TAPERPNT 27 (SUTURE) ×2 IMPLANT
SYR 30ML LL (SYRINGE) ×2 IMPLANT
SYR TB 1ML 27GX1/2 LL (SYRINGE) ×2 IMPLANT
SYRINGE 10CC LL (SYRINGE) ×2 IMPLANT
TAPE TRANSPORE STRL 2 31045 (GAUZE/BANDAGES/DRESSINGS) ×2 IMPLANT
WATER STERILE IRR 1000ML POUR (IV SOLUTION) ×1 IMPLANT

## 2015-07-31 NOTE — Progress Notes (Signed)
[facility] [place] NAME: Denise [name]    MR#:  [id number]  DATE OF BIRTH:  10-27-1929  SUBJECTIVE:  CHIEF COMPLAINT:   Chief Complaint  Patient presents with  . Hypoglycemia   Seen in route to OR. She's feeling optimistic about hip replacement surgery.  REVIEW OF SYSTEMS:   Review of Systems  Constitutional: Negative for fever.  Respiratory: Negative for shortness of breath.   Cardiovascular: Negative for chest pain and palpitations.  Gastrointestinal: Negative for nausea, vomiting and abdominal pain.  Genitourinary: Negative for dysuria.  Musculoskeletal: Positive for joint pain.    DRUG ALLERGIES:  No Known Allergies  VITALS:  Blood pressure 136/56, pulse 67, temperature 98.3 F (36.8 C), temperature source Oral, resp. rate 18, height 5\' 6"  (1.676 m), weight 97.977 kg (216 lb), SpO2 97 %.  PHYSICAL EXAMINATION:  GENERAL:  81 y.o.-year-old patient lying in the bed with no acute distress.  EYES: Pupils equal, round, reactive to light and accommodation. No scleral icterus. Extraocular muscles intact.  HEENT: Head atraumatic, normocephalic. Oropharynx and nasopharynx clear.  NECK:  Supple, no jugular venous distention. No thyroid enlargement, no tenderness.  LUNGS: Normal breath sounds bilaterally, no wheezing, rales,rhonchi or crepitation. No use of accessory muscles of respiration.  CARDIOVASCULAR: S1, S2 normal. No murmurs, rubs, or gallops.  ABDOMEN: Soft, nontender, nondistended. Bowel sounds present. No organomegaly or mass.  EXTREMITIES: No pedal edema, cyanosis, or clubbing. Very reluctant to move the right hip, pain in right hip with any movement of the right leg. NEUROLOGIC: Cranial nerves II through XII are intact.  PSYCHIATRIC: The patient is alert and oriented x 3.  SKIN: No obvious rash, lesion, or ulcer.    LABORATORY PANEL:   CBC  Recent Labs Lab 07/29/15 1059  WBC 3.8  HGB 10.9*  HCT 33.6*   PLT 186   ------------------------------------------------------------------------------------------------------------------  Chemistries   Recent Labs Lab 07/30/15 0447  NA 143  K 4.2  CL 115*  CO2 24  GLUCOSE 162*  BUN 20  CREATININE 1.63*  CALCIUM 8.3*  AST 48*  ALT 48  ALKPHOS 95  BILITOT 0.9   ------------------------------------------------------------------------------------------------------------------  Cardiac Enzymes  Recent Labs Lab 07/29/15 1059  TROPONINI <0.03   ------------------------------------------------------------------------------------------------------------------  RADIOLOGY:  Dg Hip Unilat With Pelvis 2-3 Views Right  07/30/2015   ADDENDUM REPORT: 07/30/2015 15:45 ADDENDUM: These results were called by telephone at the time of interpretation on 07/30/2015 at 3:45 pm to Dr. Myrtis Ser , who verbally acknowledged these results. Electronically Signed   By: Franki Cabot M.D.   On: 07/30/2015 15:45  07/30/2015   CLINICAL DATA:  Fall over 1 week ago, right hip pain  EXAM: DG HIP (WITH OR WITHOUT PELVIS) 2-3V RIGHT  COMPARISON:  None.  FINDINGS: There is a slightly displaced right femoral neck fracture, best seen in the superior-lateral subcapital region, but with likely extension into the more central aspects of the right femoral neck. Right femoral head remains well positioned relative to the acetabulum. No fracture seen within the adjacent right hemipelvis.  IMPRESSION: Slightly displaced fracture within the right femoral neck, best seen at the superior-lateral subcapital region but with likely extension into the more central aspects of the right femoral neck.  Electronically Signed: By: Franki Cabot M.D. On: 07/30/2015 15:39    EKG:   Orders placed or performed during the hospital encounter of 07/29/15  . ED EKG  . ED EKG    ASSESSMENT AND PLAN:  1) hypoglycemia: resolved - A1c 7.0. Would not continue hypoglycemic agents. Goal A1c in  this patient population less than 8 - CBG's have been controlled in patient. Continue sliding scale as needed  2) metabolic encephalopathy: resolved  3) hypertension: - Controlled. No antihypertensives needed  4) hip pain - X-ray showing femoral neck fracture. 2 OR today. Will need skilled nursing with discharge likely on Monday  5) CKD3: - stable  CODE STATUS: full   TOTAL TIME TAKING CARE OF THIS PATIENT: 25 minutes.  Greater than 50% of time spent in care coordination and counseling. Care plan discussed with the patient and her daughter at the bedside. POSSIBLE D/C IN 1-2 DAYS, DEPENDING ON CLINICAL CONDITION.   Myrtis Ser M.D on 07/31/2015 at 1:08 PM  Between 7am to 6pm - Pager - [phone]  After 6pm go to [email or web address] - password [email or web address]  Office  [phone]  CC: Primary care physician; Perrin Maltese, MD

## 2015-07-31 NOTE — Plan of Care (Signed)
Problem: Discharge Progression Outcomes Goal: Other Discharge Outcomes/Goals Outcome: Progressing Pt is alert and oriented, c/o R hip pain. Tramadol given with no effect, pt refuses additional pain medication. NPO after midnight for procedure. First CHG bath give in PM, second to be given in the morning.

## 2015-07-31 NOTE — Clinical Social Work Note (Signed)
Patient to have a right hemi arthroplasty for a fracture hip today. CSW will refax out her referral with this added information to the local skilled nursing facilities.  Shela Leff MSW,LCSW [phone]

## 2015-07-31 NOTE — Op Note (Signed)
07/29/2015 - 07/31/2015  2:02 PM  Patient:   Denise [name]  Pre-Op Diagnosis:   Displaced femoral neck fracture, right hip.  Post-Op Diagnosis:   Same  Procedure:   Right hip unipolar hemiarthroplasty.  Surgeon:   Pascal Lux, MD  Assistant:   Cameron Proud, PA-C  Anesthesia:   Spinal  Findings:   As above.  Complications:   None  EBL:   100 cc  Fluids:   600 cc crystalloid  UOP:   None (no Foley catheter)  TT:   None  Drains:   None  Closure:   Staples  Implants:   Biomet press-fit system with a #17 laterally offset Echo femoral stem, a 45 mm outer diameter shell, a 28 mm head, and a -6 mm neck  Brief Clinical Note:   The patient is an 81 year old female with a one-week history of right hip pain following a fall at home. Initial x-rays were unremarkable, as was a CT scan. Because of continued pain, repeat x-ray was obtained yesterday which demonstrated the presence of a minimally displaced right femoral neck fracture. The patient has been cleared medically and presents at this time for definitive management of her injury.  Procedure:   The patient was brought into the operating room. After adequate spinal anesthesia was obtained, the patient was repositioned in the right lateral decubitus position and secured using a lateral hip positioner. The right hip and lower extremity were prepped with ChloroPrep solution before being draped sterilely. Preoperative antibiotics were administered. A timeout was performed to verify the appropriate surgical site before a standard posterior approach the hip was made through an approximately 5-6 inch incision. The incision was carried down through the subcutaneous tissues to expose the gluteal fascia and proximal end of the iliotibial band. These structures were split the length of the incision and the Charnley self-retaining hip retractor placed. The bursal tissues were swept posteriorly to expose the short external rotators. The anterior  border of the piriformis tendon was identified and this plane developed down through the capsule to enter the joint. Abundant fracture hematoma was suctioned. A flap of tissue was elevated off the posterior aspect of the femoral neck and greater trochanter and retracted posteriorly. This flap included the piriformis tendon, the short external rotators, and the posterior capsule. The femoral neck was cut 1 fingerbreadth above the lesser trochanter using an oscillating saw. The neck piece was removed before the femoral head was removed in its entirety, then taken to the back table where it was measured and found to be optimally replicated by a 45 mm head. The appropriate trial head was inserted and found to demonstrate an excellent suction fit.   Attention was directed to the femoral side. The piriformis fossa was debrided of soft tissues before the intramedullary canal was accessed through this point using a triple step reamer. The canal was reamed sequentially beginning with a #7 tapered reamer and progressing to a #17 tapered reamer. This provided excellent circumferential chatter. A box osteotome was used to establish version before the canal was broached sequentially beginning with a #12 broach and progressing to a #17 broach. This was left in place and several trial reductions performed using both the standard and lateral offset neck options. The permanent #17 laterally offset femoral stem was impacted into place. A repeat trial reduction was performed using the -6 mm neck length. The -6 mm neck length demonstrated excellent stability both in extension and external rotation as well as with flexion  to 90 and internal rotation beyond 70. It also was stable in the position of sleep. The 28 mm head with the 45 mm outer diameter shell construct was put together on the back table before being impacted onto the stem of the femoral component. The Morse taper locking mechanism was verified using manual distraction  before the head was relocated and placed through a range of motion with the findings as described above.  The wound was copiously irrigated with bacitracin saline solution via the jet lavage system before the peri-incisional and pericapsular tissues were injected with 30 cc of 0.5% Sensorcaine with epinephrine and 20 cc of Exparel diluted out to 60 cc with normal saline to help with postoperative analgesia. The posterior flap was reapproximated to the posterior aspect of the greater trochanter using #2 Tycron interrupted sutures placed through drill holes. Several additional #2 Tycron interrupted sutures were used to reinforce this layer of closure. The iliotibial band was reapproximated using #1 Vicryl interrupted sutures before the gluteal fascia was closed using a running #1 Vicryl suture. At this point, 1 g of transexemic acid in 10 cc of normal saline was injected into the joint to help reduce postoperative bleeding. The subcutaneous tissues were closed in several layers using 2-0 Vicryl interrupted sutures before the skin was closed using staples. A sterile occlusive dressing was applied to the wound before the patient was placed into an abduction wedge pillow. She was then rolled back into the supine position on her hospital bed before she was awakened and returned to the recovery room in satisfactory condition after tolerating the procedure well.

## 2015-07-31 NOTE — Progress Notes (Signed)
PT Cancellation Note  Patient Details Name: Denise [name] MRN: [id number] DOB: 21-Jan-1930   Cancelled Treatment:    Reason Eval/Treat Not Completed: Medical issues which prohibited therapy (Per chart review, patient off unit for R hip replacement due to non-displaced femoral neck fracture.  Will require new orders to resume PT services post-procedure.  Please re-consult as medically appropriate)   Kristen H. Owens Shark, PT, DPT, NCS 07/31/2015, 1:19 PM [phone]

## 2015-07-31 NOTE — Transfer of Care (Signed)
Immediate Anesthesia Transfer of Care Note  Patient: Denise [name]  Procedure(s) Performed: Procedure(s): ARTHROPLASTY BIPOLAR HIP (HEMIARTHROPLASTY) (Right)  Patient Location: PACU  Anesthesia Type:Regional  Level of Consciousness: awake  Airway & Oxygen Therapy: Patient Spontanous Breathing and Patient connected to face mask oxygen  Post-op Assessment: Report given to RN and Post -op Vital signs reviewed and stable  Post vital signs: Reviewed and stable  Last Vitals:  Filed Vitals:   07/31/15 0922  BP: 136/56  Pulse: 67  Temp: 36.8 C  Resp: 18    Complications: No apparent anesthesia complications

## 2015-07-31 NOTE — Anesthesia Preprocedure Evaluation (Addendum)
Anesthesia Evaluation  Patient identified by MRN, date of birth, ID band Patient awake    Reviewed: Allergy & Precautions, H&P , NPO status , Patient's Chart, lab work & pertinent test results, reviewed documented beta blocker date and time   Airway Mallampati: II  TM Distance: >3 FB Neck ROM: full    Dental no notable dental hx.    Pulmonary neg pulmonary ROS, COPD, former smoker,    Pulmonary exam normal breath sounds clear to auscultation       Cardiovascular Exercise Tolerance: Good hypertension, negative cardio ROS   Rhythm:regular Rate:Normal     Neuro/Psych negative neurological ROS  negative psych ROS   GI/Hepatic negative GI ROS, Neg liver ROS,   Endo/Other  negative endocrine ROSdiabetes  Renal/GU negative Renal ROS  negative genitourinary   Musculoskeletal   Abdominal   Peds  Hematology negative hematology ROS (+)   Anesthesia Other Findings   Reproductive/Obstetrics negative OB ROS                             Anesthesia Physical Anesthesia Plan  ASA: III and emergent  Anesthesia Plan: General   Post-op Pain Management:    Induction:   Airway Management Planned:   Additional Equipment:   Intra-op Plan:   Post-operative Plan:   Informed Consent: I have reviewed the patients History and Physical, chart, labs and discussed the procedure including the risks, benefits and alternatives for the proposed anesthesia with the patient or authorized representative who has indicated his/her understanding and acceptance.   Dental Advisory Given  Plan Discussed with: CRNA  Anesthesia Plan Comments:         Anesthesia Quick Evaluation

## 2015-07-31 NOTE — Progress Notes (Signed)
Patient had no c/o pain this shift on 1-C   VSS Patient to be transferred to Ortho after surgery telemetry discontinued  Report called to Dava on ortho

## 2015-08-01 LAB — CBC WITH DIFFERENTIAL/PLATELET
Basophils Absolute: 0 10*3/uL (ref 0–0.1)
Basophils Relative: 0 %
Eosinophils Absolute: 0.1 10*3/uL (ref 0–0.7)
Eosinophils Relative: 2 %
HCT: 28 % — ABNORMAL LOW (ref 35.0–47.0)
Hemoglobin: 9.2 g/dL — ABNORMAL LOW (ref 12.0–16.0)
Lymphocytes Relative: 17 %
Lymphs Abs: 1 10*3/uL (ref 1.0–3.6)
MCH: 28.9 pg (ref 26.0–34.0)
MCHC: 32.8 g/dL (ref 32.0–36.0)
MCV: 88.2 fL (ref 80.0–100.0)
Monocytes Absolute: 0.7 10*3/uL (ref 0.2–0.9)
Monocytes Relative: 12 %
Neutro Abs: 4 10*3/uL (ref 1.4–6.5)
Neutrophils Relative %: 69 %
Platelets: 197 10*3/uL (ref 150–440)
RBC: 3.17 MIL/uL — ABNORMAL LOW (ref 3.80–5.20)
RDW: 14.9 % — ABNORMAL HIGH (ref 11.5–14.5)
WBC: 5.8 10*3/uL (ref 3.6–11.0)

## 2015-08-01 LAB — GLUCOSE, CAPILLARY
Glucose-Capillary: 164 mg/dL — ABNORMAL HIGH (ref 65–99)
Glucose-Capillary: 206 mg/dL — ABNORMAL HIGH (ref 65–99)
Glucose-Capillary: 172 mg/dL — ABNORMAL HIGH (ref 65–99)
Glucose-Capillary: 147 mg/dL — ABNORMAL HIGH (ref 65–99)

## 2015-08-01 LAB — BASIC METABOLIC PANEL
Anion gap: 4 — ABNORMAL LOW (ref 5–15)
BUN: 20 mg/dL (ref 6–20)
CO2: 25 mmol/L (ref 22–32)
Calcium: 8.3 mg/dL — ABNORMAL LOW (ref 8.9–10.3)
Chloride: 111 mmol/L (ref 101–111)
Creatinine, Ser: 1.6 mg/dL — ABNORMAL HIGH (ref 0.44–1.00)
GFR calc Af Amer: 33 mL/min — ABNORMAL LOW (ref >60)
GFR calc non Af Amer: 29 mL/min — ABNORMAL LOW (ref >60)
Glucose, Bld: 170 mg/dL — ABNORMAL HIGH (ref 65–99)
Potassium: 4.4 mmol/L (ref 3.5–5.1)
Sodium: 140 mmol/L (ref 135–145)

## 2015-08-01 NOTE — Progress Notes (Signed)
Clinical Education officer, museum (CSW) met with patient alone at bedside and presented SNF offers. Patient was alert and oriented and sitting up in the chair. Patient requested [facility] has not responded yet. Patient reported that she would review current bed offers. CSW made patient aware that she would likely D/C on Monday. Patient verbalized her understanding. CSW will continue to follow and assist as needed.   Blima Rich, [place] [phone]

## 2015-08-01 NOTE — Progress Notes (Signed)
Physical Therapy Treatment Patient Details Name: Denise [name] MRN: [id number] DOB: 1930/04/13 Today's Date: 08/01/2015    History of Present Illness Pt was admitted to the hospital due to low blood glucose with metabolic encephalopathy which has since resolved. Pt also with R hip pain and difficulty ambulating. Pt reports that R hip pain started 4 months ago and has progressively worsened,  . Pt has seen multiple physicians and had an MRI scheduled for Sunday. Daughter is present and reports she has been helping her mother with basic transfers and pushing her on rollator seat to the bathroom. Pt sufferred a fall approximately 2 weeks ago but states she fell on her L side but did not suffer injury. Pt denies additional falls over the last 12 months. Additional imaging since visit reveals R hip fracture. Pt underwent hemiarthroplasty on 10/7 .     PT Comments    Pt tolerating treatment session well, motivated and able to complete entire PT sesssion as planned, however ambulation tolerance remains limited and taxing. Pt continues to make progress toward goals as evidenced by improved strength and confidence during therex. Pt's greatest limitation continues to be trunk and BUE weakness which continues to limit ability to perform bed mobility at baseline function. Patient presenting with impairment of strength, pain, range of motion, balance, and activity tolerance, limiting ability to perform ADL and mobility tasks at  baseline level of function. Patient will benefit from skilled intervention to address the above impairments and limitations, in order to restore to prior level of function, improve patient safety upon discharge, and to decrease caregiver burden.    Follow Up Recommendations  SNF     Equipment Recommendations  Rolling walker with 5" wheels    Recommendations for Other Services       Precautions / Restrictions Precautions Precautions: Fall Restrictions RLE Weight Bearing:  Weight bearing as tolerated    Mobility  Bed Mobility Overal bed mobility: Needs Assistance;+2 for physical assistance Bed Mobility: Sit to Supine     Supine to sit: Max assist;+2 for physical assistance Sit to supine: +2 for physical assistance;Max assist   General bed mobility comments: Requires max assist with RLE and Trunk, especially during scooting toward HOB, even with trapeze.   Transfers Overall transfer level: Needs assistance Equipment used: Rolling walker (2 wheeled) Transfers: Sit to/from Stand Sit to Stand: Mod assist         General transfer comment: From chair, pt requires a little more physical assistance, and additional time to transfer some weight to RLE.  Ambulation/Gait Ambulation/Gait assistance: Min assist Ambulation Distance (Feet): 5 Feet Assistive device: Rolling walker (2 wheeled) Gait Pattern/deviations: Decreased step length - left;Antalgic   Gait velocity interpretation: <1.8 ft/sec, indicative of risk for recurrent falls General Gait Details: MinA given for lateral weight shifting to help progress BLE. Pt uses heavily BUE on RW. Mild knee buckling on R, requires minA to recover. Pt is exhausted and very nervous with slow ambulaiton.    Stairs            Wheelchair Mobility    Modified Rankin (Stroke Patients Only)       Balance Overall balance assessment: History of Falls;Needs assistance Sitting-balance support: Feet supported;No upper extremity supported Sitting balance-Leahy Scale: Fair     Standing balance support: Bilateral upper extremity supported;During functional activity Standing balance-Leahy Scale: Poor                      Cognition Arousal/Alertness: Awake/alert  Behavior During Therapy: WFL for tasks assessed/performed Overall Cognitive Status: Within Functional Limits for tasks assessed       Memory: Decreased recall of precautions (Unable to cite any post precautions. )               Exercises Total Joint Exercises Ankle Circles/Pumps: AROM;Both;Supine;15 reps (manually resisted Ankle plantarflexion on R) Gluteal Sets: Both;10 reps;Supine Short Arc Quad: AAROM;Right;Supine;15 reps Heel Slides: AAROM;Right;Supine;15 reps Hip ABduction/ADduction: AAROM;Right;Supine;15 reps Long Arc Quad: AAROM;Strengthening;Right;15 reps;Supine Bridges:  (*see above)    General Comments        Pertinent Vitals/Pain Pain Assessment: 0-10 Pain Score: 8  Pain Location: R hip, posterior, Achy in R thigh Pain Intervention(s): Limited activity within patient's tolerance;Premedicated before session;Monitored during session    [place] expects to be discharged to:: Private residence Living Arrangements: Alone Available Help at Discharge: Family Type of Home: Apartment Home Access: [place]: One level Home Equipment: Environmental consultant - 4 wheels;Shower seat;Grab bars - tub/shower      Prior Function Level of Independence: Needs assistance  Gait / Transfers Assistance Needed: Daugther pushing pt on rollator to and from bathroom ADL's / Homemaking Assistance Needed: Pt reports independence with bathing/dressing Comments: Daugther assists with IADLs   PT Goals (current goals can now be found in the care plan section) Acute Rehab PT Goals Patient Stated Goal: Pt agrees that she may will benefit from skilled care due to mobility restrictions.  PT Goal Formulation: With patient/family Time For Goal Achievement: 28-Aug-2015 Potential to Achieve Goals: Good Progress towards PT goals: Progressing toward goals    Frequency  BID    PT Plan Current plan remains appropriate    Co-evaluation             End of Session Equipment Utilized During Treatment: Gait belt Activity Tolerance: Patient tolerated treatment well Patient left: in bed;with call bell/phone within reach;with bed alarm set;with family/visitor present     Time: [id number] PT Time Calculation  (min) (ACUTE ONLY): 26 min  Charges:  $Therapeutic Exercise: 8-22 mins $Therapeutic Activity: 8-22 mins                    G Codes:      Buccola,Allan C Aug 28, 2015, 2:09 PM  2:10 PM  Etta Grandchild, PT, DPT [place] License # [id number]

## 2015-08-01 NOTE — Plan of Care (Signed)
Problem: Acute Rehab PT Goals(only PT should resolve) Goal: Pt will Roll Supine to Side Pt will demonstrate MinA bed mobility supine to sitting edge-of-bed to return to PLOF and to decrease caregiver burden.     Goal: Patient Will Transfer Sit To/From Stand Pt will transfer sit to/from-stand with RW at [facility] without loss-of-balance to demonstrate good safety awareness for independent mobility in home.     Goal: Pt Will Ambulate Pt will ambulate with RW at Supervision using a step-through pattern and equal step length for a distances greater than 91ft to demonstrate the ability to perform safe household distance ambulation at discharge.

## 2015-08-01 NOTE — Evaluation (Signed)
Physical Therapy Re-evaluation Patient Details Name: Denise [name] MRN: [id number] DOB: 12-30-1929 Today's Date: 08/01/2015   History of Present Illness  Pt was admitted to the hospital due to low blood glucose with metabolic encephalopathy which has since resolved. Pt also with R hip pain and difficulty ambulating. Pt reports that R hip pain started 4 months ago and has progressively worsened,  . Pt has seen multiple physicians and had an MRI scheduled for Sunday. Daughter is present and reports she has been helping her mother with basic transfers and pushing her on rollator seat to the bathroom. Pt sufferred a fall approximately 2 weeks ago but states she fell on her L side but did not suffer injury. Pt denies additional falls over the last 12 months. Additional imaging since visit reveals R hip fracture. Pt underwent hemiarthroplasty on 10/7 .   Clinical Impression  Pt is received semirecumbent in bed upon entry, awake, alert, and willing to participate, however mildly anxious regarding attempting mobility. No acute distress noted. Pt is A&Ox3 and pleasant. Pt reports zero falls in the last 6 months, with exception to a slip on a wet bathroom floor several weeks ago. Pt strength as screened by functional mobility assessment, reveals significant weakness in BUE, trunk and LLE, requiring maxA to help with most mobility. Patient presenting with impairment of strength, range of motion, balance, and activity tolerance, limiting ability to perform ADL and mobility tasks at  baseline level of function. Patient will benefit from skilled intervention to address the above impairments and limitations, in order to restore to prior level of function, improve patient safety upon discharge, and to decrease falls risk.       Follow Up Recommendations SNF    Equipment Recommendations  Rolling walker with 5" wheels    Recommendations for Other Services       Precautions / Restrictions  Precautions Precautions: Fall Restrictions RLE Weight Bearing: Weight bearing as tolerated      Mobility  Bed Mobility Overal bed mobility: Needs Assistance;+2 for physical assistance Bed Mobility: Supine to Sit     Supine to sit: Max assist;+2 for physical assistance     General bed mobility comments: Pt makes good effort in moving the RLE, and attempts with trunk, but lacks sufficient UE strength and trunk strength to sit up indep.   Transfers Overall transfer level: Needs assistance Equipment used: Rolling walker (2 wheeled) Transfers: Sit to/from Stand Sit to Stand: Min assist         General transfer comment: Pt follows verbal cues to maintaining precautions during transfer up from chair.   Ambulation/Gait Ambulation/Gait assistance: Min guard Ambulation Distance (Feet): 3 Feet Assistive device: Rolling walker (2 wheeled) Gait Pattern/deviations: Antalgic;Decreased step length - left   Gait velocity interpretation: <1.8 ft/sec, indicative of risk for recurrent falls General Gait Details: MinA given for lateral weight shifting to help progress BLE. Pt uses heavily BUE to navigate to chair.   Stairs            Wheelchair Mobility    Modified Rankin (Stroke Patients Only)       Balance Overall balance assessment: Needs assistance;History of Falls Sitting-balance support: No upper extremity supported;Feet supported Sitting balance-Leahy Scale: Fair     Standing balance support: Bilateral upper extremity supported Standing balance-Leahy Scale: Fair                               Pertinent Vitals/Pain Pain  Score: 8  Pain Location: R hip near incision.  Pain Intervention(s): Limited activity within patient's tolerance    Home Living Family/patient expects to be discharged to:: Private residence Living Arrangements: Alone Available Help at Discharge: Family Type of Home: Apartment Home Access: [place]: One  level Home Equipment: Environmental consultant - 4 wheels;Shower seat;Grab bars - tub/shower      Prior Function Level of Independence: Needs assistance   Gait / Transfers Assistance Needed: Daugther pushing pt on rollator to and from bathroom  ADL's / Homemaking Assistance Needed: Pt reports independence with bathing/dressing  Comments: Daugther assists with IADLs     Hand Dominance        Extremity/Trunk Assessment   Upper Extremity Assessment: Overall WFL for tasks assessed           Lower Extremity Assessment: Overall WFL for tasks assessed RLE Deficits / Details: Post surgical weakness, and pain inhibition.     Cervical / Trunk Assessment: Normal  Communication   Communication: No difficulties  Cognition Arousal/Alertness: Awake/alert Behavior During Therapy: WFL for tasks assessed/performed Overall Cognitive Status: Within Functional Limits for tasks assessed                      General Comments      Exercises Total Joint Exercises Ankle Circles/Pumps: AROM;Both;20 reps;Supine Gluteal Sets: Both;10 reps;Supine (Bridge initiation: 1x10 knees bent to 70 degrees. 3s Hold. ) Short Arc Quad: AAROM;Right;10 reps;Supine (Good strength.) Heel Slides: AAROM;Right;10 reps;Supine Hip ABduction/ADduction: AAROM;Right;10 reps;Supine Bridges:  (*see above)      Assessment/Plan    PT Assessment Patient needs continued PT services  PT Diagnosis Difficulty walking;Abnormality of gait;Generalized weakness;Acute pain   PT Problem List Decreased strength;Decreased activity tolerance;Decreased balance;Decreased mobility;Decreased safety awareness;Pain;Decreased range of motion  PT Treatment Interventions DME instruction;Gait training;Functional mobility training;Therapeutic activities;Therapeutic exercise;Balance training;Neuromuscular re-education;Patient/family education   PT Goals (Current goals can be found in the Care Plan section) Acute Rehab PT Goals Patient Stated Goal: Pt  agrees that she may will benefit from skille dcare due to mobility restrictions.  PT Goal Formulation: With patient/family Time For Goal Achievement: 08/24/2015 Potential to Achieve Goals: Good    Frequency BID   Barriers to discharge Decreased caregiver support      Co-evaluation               End of Session Equipment Utilized During Treatment: Gait belt Activity Tolerance: Patient tolerated treatment well Patient left: in bed;with call bell/phone within reach;with bed alarm set;with family/visitor present           Time: [id number] PT Time Calculation (min) (ACUTE ONLY): 33 min   Charges:   PT Evaluation $PT Re-evaluation: 1 Procedure PT Treatments $Therapeutic Exercise: 8-22 mins   PT G Codes:        Buccola,Allan C Aug 24, 2015, 10:48 AM  10:55 AM  Etta Grandchild, PT, DPT [place] License # [id number]

## 2015-08-01 NOTE — Progress Notes (Signed)
[facility] [place] NAME: Denise [name]    MR#:  [id number]  DATE OF BIRTH:  September 22, 1930  SUBJECTIVE:  CHIEF COMPLAINT:   Chief Complaint  Patient presents with  . Hypoglycemia   Postop day 1 after right hip repair. Doing well. Pain is controlled. Sitting up in the chair. No new complaints  REVIEW OF SYSTEMS:   Review of Systems  Constitutional: Negative for fever.  Respiratory: Negative for shortness of breath.   Cardiovascular: Negative for chest pain and palpitations.  Gastrointestinal: Negative for nausea, vomiting and abdominal pain.  Genitourinary: Negative for dysuria.  Musculoskeletal: Positive for joint pain.    DRUG ALLERGIES:  No Known Allergies  VITALS:  Blood pressure 141/92, pulse 80, temperature 98.9 F (37.2 C), temperature source Oral, resp. rate 18, height 5\' 6"  (1.676 m), weight 97.977 kg (216 lb), SpO2 96 %.  PHYSICAL EXAMINATION:  GENERAL:  81 y.o.-year-old patient sitting in chair, no acute complaints  LUNGS: Normal breath sounds bilaterally, no wheezing, rales,rhonchi or crepitation. No use of accessory muscles of respiration.  CARDIOVASCULAR: S1, S2 normal. No murmurs, rubs, or gallops.  ABDOMEN: Soft, nontender, nondistended. Bowel sounds present. No organomegaly or mass.  EXTREMITIES: No pedal edema, cyanosis, or clubbing.  NEUROLOGIC: Cranial nerves II through XII are intact.  PSYCHIATRIC: The patient is alert and oriented x 3.  SKIN: No obvious rash, lesion, or ulcer. Surgical wound is dressed   LABORATORY PANEL:   CBC  Recent Labs Lab 08/01/15 0408  WBC 5.8  HGB 9.2*  HCT 28.0*  PLT 197   ------------------------------------------------------------------------------------------------------------------  Chemistries   Recent Labs Lab 07/30/15 0447 08/01/15 0408  NA 143 140  K 4.2 4.4  CL 115* 111  CO2 24 25  GLUCOSE 162* 170*  BUN 20 20  CREATININE 1.63* 1.60*  CALCIUM  8.3* 8.3*  AST 48*  --   ALT 48  --   ALKPHOS 95  --   BILITOT 0.9  --    ------------------------------------------------------------------------------------------------------------------  Cardiac Enzymes  Recent Labs Lab 07/29/15 1059  TROPONINI <0.03   ------------------------------------------------------------------------------------------------------------------  RADIOLOGY:  Dg Hip Port Unilat With Pelvis 1v Right  07/31/2015   CLINICAL DATA:  Postop evaluation status post right hip arthroplasty.  EXAM: DG HIP (WITH OR WITHOUT PELVIS) 1V PORT RIGHT  COMPARISON:  Pelvic radiograph 07/30/2015  FINDINGS: Patient status post right hip arthroplasty. Hardware appears intact. Overlying surgical staple line. Evaluation limited due to body habitus.  IMPRESSION: Evaluation limited due to body habitus.  Status post right hip arthroplasty.   Electronically Signed   By: Lovey Newcomer M.D.   On: 07/31/2015 18:15   Dg Hip Unilat With Pelvis 2-3 Views Right  07/30/2015   ADDENDUM REPORT: 07/30/2015 15:45 ADDENDUM: These results were called by telephone at the time of interpretation on 07/30/2015 at 3:45 pm to Dr. Myrtis Ser , who verbally acknowledged these results. Electronically Signed   By: Franki Cabot M.D.   On: 07/30/2015 15:45  07/30/2015   CLINICAL DATA:  Fall over 1 week ago, right hip pain  EXAM: DG HIP (WITH OR WITHOUT PELVIS) 2-3V RIGHT  COMPARISON:  None.  FINDINGS: There is a slightly displaced right femoral neck fracture, best seen in the superior-lateral subcapital region, but with likely extension into the more central aspects of the right femoral neck. Right femoral head remains well positioned relative to the acetabulum. No fracture seen within the adjacent right hemipelvis.  IMPRESSION: Slightly displaced fracture  within the right femoral neck, best seen at the superior-lateral subcapital region but with likely extension into the more central aspects of the right femoral neck.   Electronically Signed: By: Franki Cabot M.D. On: 07/30/2015 15:39    EKG:   Orders placed or performed in visit on 11/05/13  . EKG 12-Lead    ASSESSMENT AND PLAN:   1) hypoglycemia: resolved - A1c 7.0. Would not continue hypoglycemic agents. Goal A1c in this patient population less than 8 - CBG's have been controlled in patient. Continue sliding scale as needed - Upon discharge will not need any further hypoglycemic agents  2) metabolic encephalopathy: resolved  3) hypertension: - Controlled. No antihypertensives needed  4) right femoral neck fracture - Post op day 1 after hip repair. Doing well. Working with physical therapy. Pain is controlled  5) CKD3: - stable  CODE STATUS: full   TOTAL TIME TAKING CARE OF THIS PATIENT: 25 minutes.  Greater than 50% of time spent in care coordination and counseling. Care plan discussed with the patient and her daughter at the bedside. POSSIBLE D/C MONDAY TO SKILLED NURSING, DEPENDING ON CLINICAL CONDITION.   Myrtis Ser M.D on 08/01/2015 at 11:46 AM  Between 7am to 6pm - Pager - [phone]  After 6pm go to [email or web address] - password [email or web address] [facility]  Office  [phone]  CC: Primary care physician; Perrin Maltese, MD

## 2015-08-01 NOTE — Progress Notes (Signed)
  Subjective: 1 Day Post-Op Procedure(s) (LRB): ARTHROPLASTY BIPOLAR HIP (HEMIARTHROPLASTY) (Right) Patient reports pain as moderate.   Patient seen in rounds with Dr. Marry Guan. Patient is well, and has had no acute complaints or problems Plan is to go Home versus rehabilitation after hospital stay. We will wait to see how she does with physical therapy Negative for chest pain and shortness of breath Fever: no Gastrointestinal: Negative for nausea and vomiting  Objective: Vital signs in last 24 hours: Temp:  [97.3 F (36.3 C)-98.5 F (36.9 C)] 98.5 F (36.9 C) (10/08 0437) Pulse Rate:  [63-82] 63 (10/08 0437) Resp:  [14-18] 18 (10/08 0437) BP: (136-168)/(52-78) 147/59 mmHg (10/08 0437) SpO2:  [94 %-100 %] 97 % (10/08 0437)  Intake/Output from previous day:  Intake/Output Summary (Last 24 hours) at 08/01/15 0652 Last data filed at 08/01/15 0441  Gross per 24 hour  Intake 863.75 ml  Output    900 ml  Net -36.25 ml    Intake/Output this shift: Total I/O In: 263.8 [I.V.:163.8; IV Piggyback:100] Out: 800 [Urine:800]  Labs:  Recent Labs  07/29/15 1059 08/01/15 0408  HGB 10.9* 9.2*    Recent Labs  07/29/15 1059 08/01/15 0408  WBC 3.8 5.8  RBC 3.76* 3.17*  HCT 33.6* 28.0*  PLT 186 197    Recent Labs  07/30/15 0447 08/01/15 0408  NA 143 140  K 4.2 4.4  CL 115* 111  CO2 24 25  BUN 20 20  CREATININE 1.63* 1.60*  GLUCOSE 162* 170*  CALCIUM 8.3* 8.3*   No results for input(s): LABPT, INR in the last 72 hours.   EXAM General - Patient is Alert and Oriented Extremity - Neurovascular intact Dorsiflexion/Plantar flexion intact No cellulitis present Compartment soft Dressing/Incision - clean, dry, no drainage Motor Function - intact, moving foot and toes well on exam.   Past Medical History  Diagnosis Date  . Diabetes mellitus without complication ([facility])   . Hypertension   . Arthritis   . Cataract   . COPD (chronic obstructive pulmonary disease) ([place])    . 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   Weight as of this encounter: 97.977 kg (216 lb). Advance diet Up with therapy  DVT Prophylaxis - Lovenox, Foot Pumps and TED hose Weight-Bearing as tolerated to right leg  Reche Dixon, PA-C Orthopaedic Surgery 08/01/2015, 6:52 AM

## 2015-08-01 NOTE — Care Management (Signed)
PT is recommending SNF. CSW following.

## 2015-08-02 ENCOUNTER — Inpatient Hospital Stay: Admission: RE | Admit: 2015-08-02 | Payer: Medicare Other | Source: Ambulatory Visit

## 2015-08-02 LAB — CBC
HCT: 28.7 % — ABNORMAL LOW (ref 35.0–47.0)
Hemoglobin: 9.4 g/dL — ABNORMAL LOW (ref 12.0–16.0)
MCH: 29 pg (ref 26.0–34.0)
MCHC: 32.8 g/dL (ref 32.0–36.0)
MCV: 88.4 fL (ref 80.0–100.0)
Platelets: 187 10*3/uL (ref 150–440)
RBC: 3.24 MIL/uL — ABNORMAL LOW (ref 3.80–5.20)
RDW: 15 % — ABNORMAL HIGH (ref 11.5–14.5)
WBC: 6.7 10*3/uL (ref 3.6–11.0)

## 2015-08-02 LAB — GLUCOSE, CAPILLARY
Glucose-Capillary: 142 mg/dL — ABNORMAL HIGH (ref 65–99)
Glucose-Capillary: 210 mg/dL — ABNORMAL HIGH (ref 65–99)
Glucose-Capillary: 143 mg/dL — ABNORMAL HIGH (ref 65–99)
Glucose-Capillary: 132 mg/dL — ABNORMAL HIGH (ref 65–99)

## 2015-08-02 LAB — BASIC METABOLIC PANEL
Anion gap: 4 — ABNORMAL LOW (ref 5–15)
BUN: 17 mg/dL (ref 6–20)
CO2: 23 mmol/L (ref 22–32)
Calcium: 8.3 mg/dL — ABNORMAL LOW (ref 8.9–10.3)
Chloride: 114 mmol/L — ABNORMAL HIGH (ref 101–111)
Creatinine, Ser: 1.6 mg/dL — ABNORMAL HIGH (ref 0.44–1.00)
GFR calc Af Amer: 33 mL/min — ABNORMAL LOW (ref >60)
GFR calc non Af Amer: 29 mL/min — ABNORMAL LOW (ref >60)
Glucose, Bld: 141 mg/dL — ABNORMAL HIGH (ref 65–99)
Potassium: 4.3 mmol/L (ref 3.5–5.1)
Sodium: 141 mmol/L (ref 135–145)

## 2015-08-02 MED ORDER — FLUCONAZOLE 100 MG PO TABS
150.0000 mg | ORAL_TABLET | Freq: Once | ORAL | Status: AC
  Administered 2015-08-02: 150 mg via ORAL
  Filled 2015-08-02: qty 2

## 2015-08-02 MED ORDER — POTASSIUM CHLORIDE 2 MEQ/ML IV SOLN
INTRAVENOUS | Status: DC
  Administered 2015-08-03: 01:00:00 via INTRAVENOUS
  Filled 2015-08-02 (×3): qty 1000

## 2015-08-02 NOTE — Care Management Important Message (Signed)
Important Message  Patient Details  Name: Denise [name] MRN: [id number] Date of Birth: [birth year]   Medicare Important Message Given:  Yes-third notification given    Rockett,Marilyn A, RN 08/02/2015, 8:59 AM

## 2015-08-02 NOTE — Consult Note (Signed)
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 Has seen dermatology and others in the past for what is a resistant strain or supra infection. Wound type: Moisture associated skin damage, specifically intertriginous dermatitis Pressure Ulcer POA: No Measurement: Inframammary areas are erythematous and moist without skin breakdown, sub-pannicular and inguinal areas with linear openings in the skin (partial thickness) consistent with fungal presentatino-the largest of which is on the right sub-pannicular skin fold and measures 0.2cm x 14cm in width x 0.2cm depth. Wound [id number], moist, caked with nystatin powder that is absorbing moisture and creating friction injuries in the skin folds. Drainage (amount, consistency, odor) scant, serous Periwound: intact, macerated Dressing procedure/placement/frequency: I will address microclimate management via a a therapeutic mattress with low air loss feature and skin fold management by discontinuing use of nystatin powder in favor of an antimicrobial textile  To both wick away moisture and to donate an antimicrobial property.  There is evidence to support systemic antifungal (e.g., Difulcan) use in these situations (either a one-time dose or a dose repeated after one skipped day). If you agree, please order.  Additionally, I will provide a pressure redistribution chair pad for OOB use in chair. [place] nursing team will not follow, but will remain available to this patient, the nursing and medical team.  Please re-consult if needed. Thanks, Maudie Flakes, MSN, RN, [place], [place], [place] [phone])

## 2015-08-02 NOTE — Progress Notes (Signed)
Physical Therapy Treatment Patient Details Name: Denise [name] MRN: [id number] DOB: 1930/07/23 Today's Date: 08/02/2015    History of Present Illness Pt was admitted to the hospital due to low blood glucose with metabolic encephalopathy which has since resolved. Pt also with R hip pain and difficulty ambulating. Pt reports that R hip pain started 4 months ago and has progressively worsened. Pt has seen multiple physicians and had an MRI scheduled for Sunday. Daughter is present and reports she has been helping her mother with basic transfers and pushing her on rollator seat to the bathroom. Pt sufferred a fall approximately 2 weeks ago. States she fell on her L side but did not suffer injury. Pt denies additional falls over the last 12 months. Additional imaging since visit reveals R hip fracture. Pt underwent hemiarthroplasty on 7 Oct.    PT Comments    Patient is a pleasant 81 y.o. female who is today complaining of itching from fungus. Patient required increased time to perform bed mobility with maximal physical assistance and verbal cues from PT. Returning from sit to supine seemed to be easier for her, as she was moving to L side of bed. Patient was unable to perform sit to stand transfer after ~6 attempts with maximal assistance from PT which was not as limiting at previous sessions. Patient seemed unable to push through LEs in sitting, yet she could do so to move towards [facility] in supine. Patient will continue to benefit from progressive strengthening and balance training to improve mobility and function.  Follow Up Recommendations  SNF     Equipment Recommendations  Rolling walker with 5" wheels    Recommendations for Other Services       Precautions / Restrictions Precautions Precautions: Posterior Hip;Fall Restrictions Weight Bearing Restrictions: Yes RLE Weight Bearing: Weight bearing as tolerated    Mobility  Bed Mobility Overal bed mobility: Needs Assistance Bed Mobility:  Supine to Sit;Sit to Supine     Supine to sit: Max assist Sit to supine: Max assist   General bed mobility comments: Patient requires maximal assistance for R LE and trunk. Maximal verbal cues utilized for sequencing to maintain precautions.  Transfers Overall transfer level: Needs assistance Equipment used: Rolling walker (2 wheeled) Transfers: Sit to/from Stand Sit to Stand: Total assist;+2 physical assistance         General transfer comment: This morning, patient was unable to perform sit to stand with maximal assistance from 1 PT. Patient required verbal cues for placing hands and sequencing. Unable to push through LEs as instructed after 6 attempts.  Ambulation/Gait   Ambulation Distance (Feet): 0 Feet             Stairs            Wheelchair Mobility    Modified Rankin (Stroke Patients Only)       Balance Overall balance assessment: History of Falls Sitting-balance support: Feet supported;Single extremity supported Sitting balance-Leahy Scale: Fair Sitting balance - Comments: Patient has tendency to lean to side. Unable to weightshift independently.                            Cognition Arousal/Alertness: Awake/alert Behavior During Therapy: WFL for tasks assessed/performed Overall Cognitive Status: Within Functional Limits for tasks assessed       Memory: Decreased recall of precautions (Could not state any precautions)              Exercises Total Joint  Exercises Ankle Circles/Pumps: AROM;20 reps Gluteal Sets: AROM;20 reps Short Arc Quad: AROM;15 reps Heel Slides: AAROM;10 reps Hip ABduction/ADduction: AAROM;15 reps Long Arc Quad: AROM;10 reps    General Comments        Pertinent Vitals/Pain Pain Assessment: No/denies pain Pain Score: 0-No pain Pain Intervention(s): Premedicated before session    Home Living                      Prior Function            PT Goals (current goals can now be found in the  care plan section) Acute Rehab PT Goals Patient Stated Goal: Pt agrees that she may will benefit from skilled care due to mobility restrictions.  PT Goal Formulation: With patient Time For Goal Achievement: 08/14/15 Potential to Achieve Goals: Good Progress towards PT goals: PT to reassess next treatment    Frequency  BID    PT Plan Current plan remains appropriate    Co-evaluation             End of Session Equipment Utilized During Treatment: Gait belt Activity Tolerance: Patient tolerated treatment well;Other (comment) (Limited by nausea and weakness) Patient left: in bed;with call bell/phone within reach;with bed alarm set;with SCD's reapplied     Time: 0825-0858 PT Time Calculation (min) (ACUTE ONLY): 33 min  Charges:  $Therapeutic Exercise: 8-22 mins $Therapeutic Activity: 8-22 mins                    G Codes:      Dorice Lamas, PT, DPT 08/02/2015, 9:42 AM

## 2015-08-02 NOTE — Progress Notes (Signed)
[place] [facility] [place] NAME: Denise [name]    MR#:  [id number]  DATE OF BIRTH:  [birth year]  SUBJECTIVE:  CHIEF COMPLAINT:   Chief Complaint  Patient presents with  . Hypoglycemia   Postop day 1 after right hip repair. Doing well. Pain is controlled. Sitting up in the chair. No new complaints  REVIEW OF SYSTEMS:   Review of Systems  Constitutional: Negative for fever.  Respiratory: Negative for shortness of breath.   Cardiovascular: Negative for chest pain and palpitations.  Gastrointestinal: Negative for nausea, vomiting and abdominal pain.  Genitourinary: Negative for dysuria.  Musculoskeletal: Positive for joint pain.    DRUG ALLERGIES:  No Known Allergies  VITALS:  Blood pressure 147/62, pulse 65, temperature 99.3 F (37.4 C), temperature source Oral, resp. rate 17, height 5\' 6"  (1.676 m), weight 97.977 kg (216 lb), SpO2 94 %.  PHYSICAL EXAMINATION:  GENERAL:  81 y.o.-year-old patient sitting in chair, no acute complaints  LUNGS: Normal breath sounds bilaterally, no wheezing, rales,rhonchi or crepitation. No use of accessory muscles of respiration.  CARDIOVASCULAR: S1, S2 normal. No murmurs, rubs, or gallops.  ABDOMEN: Soft, nontender, nondistended. Bowel sounds present. No organomegaly or mass.  EXTREMITIES: No pedal edema, cyanosis, or clubbing.  NEUROLOGIC: Cranial nerves II through XII are intact.  PSYCHIATRIC: The patient is alert and oriented x 3.  SKIN: No obvious rash, lesion, or ulcer. Surgical wound is dressed   LABORATORY PANEL:   CBC  Recent Labs Lab 08/02/15 0355  WBC 6.7  HGB 9.4*  HCT 28.7*  PLT 187   ------------------------------------------------------------------------------------------------------------------  Chemistries   Recent Labs Lab 07/30/15 0447  08/02/15 0355  NA 143  < > 141  K 4.2  < > 4.3  CL 115*  < > 114*  CO2 24  < > 23  GLUCOSE 162*  < > 141*  BUN 20  < > 17   CREATININE 1.63*  < > 1.60*  CALCIUM 8.3*  < > 8.3*  AST 48*  --   --   ALT 48  --   --   ALKPHOS 95  --   --   BILITOT 0.9  --   --   < > = values in this interval not displayed. ------------------------------------------------------------------------------------------------------------------  Cardiac Enzymes  Recent Labs Lab 07/29/15 1059  TROPONINI <0.03   ------------------------------------------------------------------------------------------------------------------  RADIOLOGY:  Dg Hip Port Unilat With Pelvis 1v Right  07/31/2015   CLINICAL DATA:  Postop evaluation status post right hip arthroplasty.  EXAM: DG HIP (WITH OR WITHOUT PELVIS) 1V PORT RIGHT  COMPARISON:  Pelvic radiograph 07/30/2015  FINDINGS: Patient status post right hip arthroplasty. Hardware appears intact. Overlying surgical staple line. Evaluation limited due to body habitus.  IMPRESSION: Evaluation limited due to body habitus.  Status post right hip arthroplasty.   Electronically Signed   By: Lovey Newcomer M.D.   On: 07/31/2015 18:15    EKG:   Orders placed or performed in visit on 11/05/13  . EKG 12-Lead    ASSESSMENT AND PLAN:   1) hypoglycemia: resolved - A1c 7.0. Would not continue hypoglycemic agents. Goal A1c in this patient population less than 8 - CBG's have been controlled. Continue sliding scale as needed - Upon discharge will not need any further hypoglycemic agents  2) metabolic encephalopathy: resolved  3) hypertension: - Controlled. No antihypertensives needed  4) right femoral neck fracture - Post op day 1 after right hip hemiarthroplasty. Doing well. Working with  physical therapy. Pain is controlled - Plan for discharge to skilled nursing tomorrow  5) CKD3: - stable  6) intertrigo: Appreciate wound care consultation. Will give one-time dose of Diflucan. Continue antimicrobials textile  CODE STATUS: full   TOTAL TIME TAKING CARE OF THIS PATIENT: 25 minutes.  Greater than 50%  of time spent in care coordination and counseling. Care plan discussed with the patient and her daughter at the bedside. POSSIBLE D/C MONDAY TO SKILLED NURSING, DEPENDING ON CLINICAL CONDITION.   Myrtis Ser M.D on 08/02/2015 at 1:35 PM  Between 7am to 6pm - Pager - [phone]  After 6pm go to [email or web address] - password [email or web address] [facility]  Office  [phone]  CC: Primary care physician; Perrin Maltese, MD

## 2015-08-02 NOTE — Progress Notes (Signed)
ORTHOPAEDICS PROGRESS NOTE  PATIENT NAME: Denise [name] DOB: 23-Jun-1930  MRN: [id number]  POD # 2: Right hip hemiarthroplasty for a femoral neck fracture   Subjective: Patient reports pain as mild.   She reported some nausea during PT this AM.  Objective: Vital signs in last 24 hours: Temp:  [98.9 F (37.2 C)-99.8 F (37.7 C)] 99.3 F (37.4 C) (10/09 0912) Pulse Rate:  [65-80] 65 (10/09 0912) Resp:  [16-18] 17 (10/09 0912) BP: (144-155)/(51-62) 147/62 mmHg (10/09 0912) SpO2:  [94 %-99 %] 94 % (10/09 0912)  Intake/Output from previous day: 10/08 0701 - 10/09 0700 In: 3416 [P.O.:600; I.V.:2816] Out: 450 [Urine:450]   Recent Labs  08/01/15 0408 08/02/15 0355  WBC 5.8 6.7  HGB 9.2* 9.4*  HCT 28.0* 28.7*  PLT 197 187  K 4.4 4.3  CL 111 114*  CO2 25 23  BUN 20 17  CREATININE 1.60* 1.60*  GLUCOSE 170* 141*  CALCIUM 8.3* 8.3*    EXAM General: Patient is Alert and Oriented Lungs: clear to auscultation Cardiac: normal rate, regular rhythm, normal S1, S2, no murmurs, rubs, clicks or gallops Right lower extremity - Hip abduction pillow in place. Homan test is negative. No significant ecchymosis. Dressing dry and intact. Neurologic: Awake, alert, and oriented. Sensory & motor function intact.  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 PT notes reviewed. Plan is to go Skilled nursing facility after hospital stay. DVT Prophylaxis - Lovenox, Foot Pumps and TED hose Weight-Bearing as tolerated to right leg  James P. Holley Bouche M.D.

## 2015-08-02 NOTE — Progress Notes (Signed)
Patient and daughter requested to talk with Clinical Education officer, museum (CSW).  Patient continues to express that she wants rehab at [facility].  However [facility] has not made a bed offer.  CSW reviewed bed offers with patient and daughter.  Patient and daughter Denise [name]) have decided on [facility] as a backup in the event [facility] does not make an offer. Per MD anticipated discharge is Monday.   CSW  will follow up with [facility] in the AM for possible bed offer.  Casimer Lanius. Latanya Presser, MSW Clinical Social Work Department Emergency Room [phone] 2:59 PM

## 2015-08-03 ENCOUNTER — Encounter
Admission: RE | Admit: 2015-08-03 | Discharge: 2015-08-03 | Disposition: A | Discharge status: Home / Self Care | Payer: Medicare Other | Source: Ambulatory Visit | Attending: Internal Medicine | Admitting: Internal Medicine

## 2015-08-03 DIAGNOSIS — S72141A Displaced intertrochanteric fracture of right femur, initial encounter for closed fracture: Secondary | ICD-10-CM | POA: Diagnosis present

## 2015-08-03 DIAGNOSIS — R410 Disorientation, unspecified: Secondary | ICD-10-CM | POA: Diagnosis not present

## 2015-08-03 LAB — CBC
HCT: 28.2 % — ABNORMAL LOW (ref 35.0–47.0)
Hemoglobin: 9.4 g/dL — ABNORMAL LOW (ref 12.0–16.0)
MCH: 29.5 pg (ref 26.0–34.0)
MCHC: 33.2 g/dL (ref 32.0–36.0)
MCV: 88.7 fL (ref 80.0–100.0)
Platelets: 195 10*3/uL (ref 150–440)
RBC: 3.17 MIL/uL — ABNORMAL LOW (ref 3.80–5.20)
RDW: 14.6 % — ABNORMAL HIGH (ref 11.5–14.5)
WBC: 5.8 10*3/uL (ref 3.6–11.0)

## 2015-08-03 LAB — BASIC METABOLIC PANEL
Anion gap: 3 — ABNORMAL LOW (ref 5–15)
BUN: 17 mg/dL (ref 6–20)
CO2: 25 mmol/L (ref 22–32)
Calcium: 8.3 mg/dL — ABNORMAL LOW (ref 8.9–10.3)
Chloride: 112 mmol/L — ABNORMAL HIGH (ref 101–111)
Creatinine, Ser: 1.48 mg/dL — ABNORMAL HIGH (ref 0.44–1.00)
GFR calc Af Amer: 37 mL/min — ABNORMAL LOW (ref >60)
GFR calc non Af Amer: 32 mL/min — ABNORMAL LOW (ref >60)
Glucose, Bld: 169 mg/dL — ABNORMAL HIGH (ref 65–99)
Potassium: 4.4 mmol/L (ref 3.5–5.1)
Sodium: 140 mmol/L (ref 135–145)

## 2015-08-03 LAB — GLUCOSE, CAPILLARY
Glucose-Capillary: 154 mg/dL — ABNORMAL HIGH (ref 65–99)
Glucose-Capillary: 169 mg/dL — ABNORMAL HIGH (ref 65–99)
Glucose-Capillary: 147 mg/dL — ABNORMAL HIGH (ref 65–99)
Glucose-Capillary: 185 mg/dL — ABNORMAL HIGH (ref 65–99)

## 2015-08-03 MED ORDER — ENOXAPARIN SODIUM 40 MG/0.4ML ~~LOC~~ SOLN: 40.0000 mg | SUBCUTANEOUS | Status: DC

## 2015-08-03 MED ORDER — TRAMADOL HCL 50 MG PO TABS: 50.0000 mg | ORAL_TABLET | Freq: Two times a day (BID) | ORAL | Status: DC | PRN

## 2015-08-03 MED ORDER — OXYCODONE-ACETAMINOPHEN 5-325 MG PO TABS: 1.0000 | ORAL_TABLET | ORAL | Status: DC | PRN

## 2015-08-03 MED ORDER — BISACODYL 10 MG RE SUPP: 10.0000 mg | Freq: Every day | RECTAL | Status: DC | PRN

## 2015-08-03 NOTE — Progress Notes (Signed)
  Subjective: 3 Days Post-Op Procedure(s) (LRB): ARTHROPLASTY BIPOLAR HIP (HEMIARTHROPLASTY) (Right) Patient reports pain as mild.   Patient is well, and has had no acute complaints or problems Plan is to go Skilled nursing facility after hospital stay. Negative for chest pain and shortness of breath Fever: no Gastrointestinal:Negative for nausea and vomiting  Objective: Vital signs in last 24 hours: Temp:  [98.2 F (36.8 C)-99.3 F (37.4 C)] 98.2 F (36.8 C) (10/10 0734) Pulse Rate:  [61-73] 66 (10/10 0734) Resp:  [16-18] 18 (10/10 0734) BP: (125-163)/(48-62) 133/52 mmHg (10/10 0734) SpO2:  [94 %-100 %] 100 % (10/10 0734)  Intake/Output from previous day:  Intake/Output Summary (Last 24 hours) at 08/03/15 0749 Last data filed at 08/02/15 1932  Gross per 24 hour  Intake    960 ml  Output      0 ml  Net    960 ml    Intake/Output this shift:    Labs:  Recent Labs  08/01/15 0408 08/02/15 0355 08/03/15 0355  HGB 9.2* 9.4* 9.4*    Recent Labs  08/02/15 0355 08/03/15 0355  WBC 6.7 5.8  RBC 3.24* 3.17*  HCT 28.7* 28.2*  PLT 187 195    Recent Labs  08/02/15 0355 08/03/15 0355  NA 141 140  K 4.3 4.4  CL 114* 112*  CO2 23 25  BUN 17 17  CREATININE 1.60* 1.48*  GLUCOSE 141* 169*  CALCIUM 8.3* 8.3*   No results for input(s): LABPT, INR in the last 72 hours.   EXAM General - Patient is Alert, Appropriate and Oriented Extremity - Neurologically intact ABD soft Intact pulses distally Dorsiflexion/Plantar flexion intact Incision: scant drainage No cellulitis present Dressing/Incision - blood tinged drainage Motor Function - intact, moving foot and toes well on exam.   Abdomen soft on exam.  Past Medical History  Diagnosis Date  . Diabetes mellitus without complication ([place])   . Hypertension   . Arthritis   . Cataract   . COPD (chronic obstructive pulmonary disease) ([place])   . 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   Weight as of this encounter: 97.977 kg (216 lb). Advance diet Up with therapy   Pt is doing well with no acute complaints. Pt has had a BM and has urinated without the foley.  Staples to be removed in 10-14 days. Pt can weight-bear as tolerated to the right lower extremity. Pt is to follow-up with [facility] in 10-14 days for staple removal and in 6 weeks for standard post-op appointment. Pt can be d/c when cleared by internal medicine.  DVT Prophylaxis - Lovenox, Foot Pumps and TED hose Weight-Bearing as tolerated to right leg  J. Cameron Proud, PA-C [facility] Orthopaedic Surgery 08/03/2015, 7:49 AM

## 2015-08-03 NOTE — Discharge Summary (Signed)
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 Diabetes mellitus without complication ([place])   . Hypertension   . Arthritis   . Cataract   . COPD (chronic obstructive pulmonary disease) ([place])   . Emphysema of lung ([place])     HOSPITAL COURSE:    1) hypoglycemia: resolved - A1c 7.0. Would not continue hypoglycemic agents. Goal A1c in this patient population less than 8 - CBG's have been controlled. Continue sliding scale as needed - Upon discharge will not need any further hypoglycemic agents - follow up with PCP in 2 weeks  2) metabolic encephalopathy: resolved  3) hypertension: - Controlled. Continue on verapamil and avapro.  4) right femoral neck fracture - Post op day 2 after right hip hemiarthroplasty. Doing well. Working with physical therapy. Pain is controlled - Plan for discharge to skilled nursing today - anticoagulation/DVT phophylaxis per ortho  5) CKD3: - stable  6) intertrigo: Appreciate wound care consultation. Will give one-time dose of Diflucan. Continue antimicrobial textile.   DISCHARGE CONDITIONS:   stable  CONSULTS OBTAINED:  Treatment Team:  Corky Mull, MD  DRUG ALLERGIES:  No Known Allergies  DISCHARGE MEDICATIONS:   Current Discharge Medication List    START taking these medications   Details  bisacodyl (DULCOLAX) 10 MG suppository Place 1 suppository (10 mg total) rectally daily as needed for moderate constipation. Qty:  12 suppository, Refills: 0    enoxaparin (LOVENOX) 40 MG/0.4ML injection Inject 0.4 mLs (40 mg total) into the skin daily. Qty: 14 Syringe, Refills: 0      CONTINUE these medications which have CHANGED   Details  oxyCODONE-acetaminophen (PERCOCET/ROXICET) 5-325 MG tablet Take 1 tablet by mouth every 4 (four) hours as needed for severe pain. Qty: 15 tablet, Refills: 0    traMADol (ULTRAM) 50 MG tablet Take 1 tablet (50 mg total) by mouth every 12 (twelve) hours as needed for severe pain. Qty: 12 tablet, Refills: 0      CONTINUE these medications which have NOT CHANGED   Details  atorvastatin (LIPITOR) 40 MG tablet Take 40 mg by mouth at bedtime.     fenofibrate (TRICOR) 145 MG tablet Take 145 mg by mouth daily.    Fluticasone Furoate-Vilanterol (BREO ELLIPTA) 100-25 MCG/INH AEPB Inhale 1 puff into the lungs daily.    levothyroxine (SYNTHROID, LEVOTHROID) 137 MCG tablet Take 137 mcg by mouth daily.     meloxicam (MOBIC) 7.5 MG tablet Take 7.5 mg by mouth 2 (two) times daily after a meal.     pantoprazole (PROTONIX) 40 MG tablet Take 40 mg by mouth daily.    tiotropium (SPIRIVA) 18 MCG inhalation capsule Place 18 mcg into inhaler and inhale daily.    valsartan (DIOVAN) 160 MG tablet Take 160 mg by mouth daily.    verapamil (CALAN-SR) 240 MG CR tablet Take 240 mg by mouth at bedtime.      STOP taking these medications     glimepiride (AMARYL) 4 MG tablet  LORazepam (ATIVAN) 1 MG tablet      metoprolol tartrate (LOPRESSOR) 25 MG tablet          DISCHARGE INSTRUCTIONS:   DIET:  Cardiac diet and Diabetic diet  DISCHARGE CONDITION:  Stable  ACTIVITY:  Activity as tolerated per physical therapy recomendation  OXYGEN:  Home Oxygen: No.   Oxygen Delivery: room air  DISCHARGE LOCATION:  nursing home   If you experience worsening of your admission symptoms, develop shortness of breath, life threatening emergency, suicidal or homicidal thoughts you must seek  medical attention immediately by calling 911 or calling your MD immediately  if symptoms less severe.  You Must read complete instructions/literature along with all the possible adverse reactions/side effects for all the Medicines you take and that have been prescribed to you. Take any new Medicines after you have completely understood and accpet all the possible adverse reactions/side effects.   Please note  You were cared for by a hospitalist during your hospital stay. If you have any questions about your discharge medications or the care you received while you were in the hospital after you are discharged, you can call the unit and asked to speak with the hospitalist on call if the hospitalist that took care of you is not available. Once you are discharged, your primary care physician will handle any further medical issues. Please note that NO REFILLS for any discharge medications will be authorized once you are discharged, as it is imperative that you return to your primary care physician (or establish a relationship with a primary care physician if you do not have one) for your aftercare needs so that they can reassess your need for medications and monitor your lab values.    Today   CHIEF COMPLAINT:   Chief Complaint  Patient presents with  . Hypoglycemia    HISTORY OF PRESENT ILLNESS:  Denise [name] is a 81 y.o. female with a known history of hypertension, diabetes mellitus, recent right hip pain came in secondary to hypoglycemia. Patient had lipase low blood glucose of 50 yesterday evening, patient had large hamburger, persistently had low glucose of 75. Patient's daughter called her this morning but she did not respond to the phone call, daughter went to check on patient when she she found her unconscious. Patient blood glucose at that time was 20,.D50 is given by EMS. Blood glucose went up to 181 but then dropped again to 35. Patient is not sure when she took her glipizide. Has been  taking the Percocet, tramadol for right hip pain recently. Subsequent blood glucose levels were still low at 35's started on D10 in the ER. She is alert oriented. Denies any nausea or vomiting. Also had a recent fall history due to his complaints.   VITAL SIGNS:  Blood pressure 133/52, pulse 61, temperature 98.2 F (36.8 C), temperature source Oral, resp. rate 18, height 5\' 6"  (1.676 m), weight 97.977 kg (216 lb), SpO2 95 %.  I/O:   Intake/Output Summary (Last 24 hours) at 08/03/15 1018 Last data filed at 08/03/15 0800  Gross per 24 hour  Intake   1320 ml  Output      0 ml  Net   1320 ml    PHYSICAL EXAMINATION:  GENERAL: 81 y.o.-year-old patient sitting in chair, no acute complaints  LUNGS: Normal breath sounds bilaterally, no wheezing, rales,rhonchi or crepitation. No use of accessory muscles of respiration.  CARDIOVASCULAR: S1, S2 normal. No murmurs, rubs, or gallops.  ABDOMEN: Soft, nontender, nondistended. Bowel sounds  present. No organomegaly or mass.  EXTREMITIES: No pedal edema, cyanosis, or clubbing.  NEUROLOGIC: Cranial nerves II through XII are intact.  PSYCHIATRIC: The patient is alert and oriented x 3.  SKIN: severe intertrigo in groin area with maceration and breakdown, no abscess, no drainage  DATA REVIEW:   CBC  Recent Labs Lab 08/03/15 0355  WBC 5.8  HGB 9.4*  HCT 28.2*  PLT 195    Chemistries   Recent Labs Lab 07/30/15 0447  08/03/15 0355  NA 143  < > 140  K 4.2  < > 4.4  CL 115*  < > 112*  CO2 24  < > 25  GLUCOSE 162*  < > 169*  BUN 20  < > 17  CREATININE 1.63*  < > 1.48*  CALCIUM 8.3*  < > 8.3*  AST 48*  --   --   ALT 48  --   --   ALKPHOS 95  --   --   BILITOT 0.9  --   --   < > = values in this interval not displayed.  Cardiac Enzymes  Recent Labs Lab 07/29/15 1059  TROPONINI <0.03    Microbiology Results  Results for orders placed or performed during the hospital encounter of 07/24/15  Urine culture     Status: None    Collection Time: 07/24/15  9:48 PM  Result Value Ref Range Status   Specimen Description URINE, CLEAN CATCH  Final   Special Requests NONE  Final   Culture MULTIPLE SPECIES PRESENT, SUGGEST RECOLLECTION  Final   Report Status 07/26/2015 FINAL  Final    RADIOLOGY:  No results found.  EKG:   Orders placed or performed in visit on 11/05/13  . EKG 12-Lead      Management plans discussed with the patient, family and they are in agreement.  CODE STATUS:     Code Status Orders        Start     Ordered   07/31/15 1616  Full code   Continuous     07/31/15 1615      TOTAL TIME TAKING CARE OF THIS PATIENT: 40 minutes.  Greater than 50% of time spent in care coordination and counseling.  Myrtis Ser M.D on 08/03/2015 at 10:18 AM  Between 7am to 6pm - Pager - [phone]  After 6pm go to [email or web address] - password [email or web address] [facility]  Office  [phone]  CC: Primary care physician; Perrin Maltese, MD

## 2015-08-03 NOTE — Progress Notes (Signed)
DC summary and Rx faxed to [facility] and packet complete. CSW confirmed with Maudie Mercury at [facility] they are able to accept pt today. RN to call report. CSW signing off at d/c.   Wandra Feinstein, MSW, LCSW [phone] (weekend coverage)

## 2015-08-03 NOTE — Progress Notes (Signed)
Checked to see if pre-authorization would be needed for non-emergent EMS transport. Per [employer] benefits obtained online through [employer], patient has an [employer] PPO policy.  Medicare  Medicare PPO plans do not require pre-auth for non-emergent ground transports using service codes [phone] or [phone].

## 2015-08-03 NOTE — Progress Notes (Signed)
CSW presented bed offers and pt has accepted offer from [facility]. Anticipate d/c today. CSW notified pt's dtr at pt's request.   Wandra Feinstein, MSW, LCSW [phone]

## 2015-08-03 NOTE — Progress Notes (Signed)
Physical Therapy Treatment Patient Details Name: Denise [name] MRN: [id number] DOB: Jun 06, 1930 Today's Date: 08/03/2015    History of Present Illness Pt was admitted to the hospital due to low blood glucose with metabolic encephalopathy which has since resolved. Pt also with R hip pain and difficulty ambulating. Pt reports that R hip pain started 4 months ago and has progressively worsened. Pt has seen multiple physicians and had an MRI scheduled for Sunday. Daughter is present and reports she has been helping her mother with basic transfers and pushing her on rollator seat to the bathroom. Pt sufferred a fall approximately 2 weeks ago. States she fell on her L side but did not suffer injury. Pt denies additional falls over the last 12 months. Additional imaging since visit reveals R hip fracture. Pt underwent hemiarthroplasty on 7 Oct.    PT Comments    Pt agreeable to PT this am, but does not wish out of bed due to anticipating discharge today and increased pain this a.m in R hip. Pt requires Mod A for rolling/repositioning bed mobility for bedpan use/personal hygiene  and repositioning. Pt participates well with bed exercises demonstrating understanding; requires active assisted range of motion on the R. Pt remembers 1 of 3 posterior hip precautions; re-education on all hip precautions. Discharge planned to [facility] today to continue PT.   Follow Up Recommendations  SNF     Equipment Recommendations  Rolling walker with 5" wheels    Recommendations for Other Services       Precautions / Restrictions Precautions Precautions: Posterior Hip;Fall Restrictions Weight Bearing Restrictions: Yes RLE Weight Bearing: Weight bearing as tolerated    Mobility  Bed Mobility Overal bed mobility: Needs Assistance Bed Mobility: Rolling Rolling: Mod assist (and use of rails; assist with personal hygiene/reposition)         General bed mobility comments: Pt refused up in bed/out of bed  due to preparing for discharge  Transfers                    Ambulation/Gait                 Stairs            Wheelchair Mobility    Modified Rankin (Stroke Patients Only)       Balance                                    Cognition Arousal/Alertness: Awake/alert Behavior During Therapy: WFL for tasks assessed/performed Overall Cognitive Status: Within Functional Limits for tasks assessed       Memory: Decreased recall of precautions              Exercises Total Joint Exercises Ankle Circles/Pumps: AROM;Both;20 reps;Supine Quad Sets: Strengthening;Both;20 reps;Supine Gluteal Sets: Strengthening;Both;20 reps;Supine Towel Squeeze: Strengthening;Both;20 reps;Supine Short Arc Quad: AROM;Both;20 reps;Supine Heel Slides: AAROM;Both;20 reps;Supine Hip ABduction/ADduction: AAROM;Both;20 reps;Supine Straight Leg Raises: AAROM;Both;10 reps;Supine    General Comments        Pertinent Vitals/Pain Pain Assessment: 0-10 Pain Score: 7  Pain Intervention(s): Premedicated before session;Limited activity within patient's tolerance;Repositioned    Home Living                      Prior Function            PT Goals (current goals can now be found in the care plan section) Progress towards PT  goals: Progressing toward goals (slowly)    Frequency  BID    PT Plan Current plan remains appropriate    Co-evaluation             End of Session   Activity Tolerance: Patient limited by fatigue;Patient limited by pain Patient left: in bed;with call bell/phone within reach;with bed alarm set;with SCD's reapplied     Time: [id number] PT Time Calculation (min) (ACUTE ONLY): 32 min  Charges:  $Therapeutic Exercise: 8-22 mins $Therapeutic Activity: 8-22 mins                    G Codes:      Charlaine Dalton 08/03/2015, 10:59 AM

## 2015-08-03 NOTE — Discharge Instructions (Signed)

## 2015-08-04 DIAGNOSIS — R410 Disorientation, unspecified: Secondary | ICD-10-CM | POA: Diagnosis present

## 2015-08-04 LAB — GLUCOSE, CAPILLARY
Glucose-Capillary: 116 mg/dL — ABNORMAL HIGH (ref 65–99)
Glucose-Capillary: 205 mg/dL — ABNORMAL HIGH (ref 65–99)
Glucose-Capillary: 140 mg/dL — ABNORMAL HIGH (ref 65–99)
Glucose-Capillary: 174 mg/dL — ABNORMAL HIGH (ref 65–99)

## 2015-08-04 LAB — SURGICAL PATHOLOGY

## 2015-08-05 DIAGNOSIS — R410 Disorientation, unspecified: Secondary | ICD-10-CM | POA: Diagnosis not present

## 2015-08-05 LAB — GLUCOSE, CAPILLARY
Glucose-Capillary: 121 mg/dL — ABNORMAL HIGH (ref 65–99)
Glucose-Capillary: 183 mg/dL — ABNORMAL HIGH (ref 65–99)
Glucose-Capillary: 176 mg/dL — ABNORMAL HIGH (ref 65–99)

## 2015-08-05 NOTE — Anesthesia Postprocedure Evaluation (Signed)
  Anesthesia Post-op Note  Patient: Denise [name]  Procedure(s) Performed: Procedure(s): ARTHROPLASTY BIPOLAR HIP (HEMIARTHROPLASTY) (Right)  Anesthesia type:General  Patient location: PACU  Post pain: Pain level controlled  Post assessment: Post-op Vital signs reviewed, Patient's Cardiovascular Status Stable, Respiratory Function Stable, Patent Airway and No signs of Nausea or vomiting  Post vital signs: Reviewed and stable  Last Vitals:  Filed Vitals:   08/03/15 1441  BP: 154/49  Pulse: 69  Temp: 36.8 C  Resp: 18    Level of consciousness: awake, alert  and patient cooperative  Complications: No apparent anesthesia complications

## 2015-08-06 ENCOUNTER — Encounter: Payer: Self-pay | Admitting: Surgery

## 2015-08-07 DIAGNOSIS — R410 Disorientation, unspecified: Secondary | ICD-10-CM | POA: Diagnosis not present

## 2015-08-07 LAB — GLUCOSE, CAPILLARY
Glucose-Capillary: 185 mg/dL — ABNORMAL HIGH (ref 65–99)
Glucose-Capillary: 188 mg/dL — ABNORMAL HIGH (ref 65–99)
Glucose-Capillary: 202 mg/dL — ABNORMAL HIGH (ref 65–99)
Glucose-Capillary: 189 mg/dL — ABNORMAL HIGH (ref 65–99)

## 2015-08-08 DIAGNOSIS — R410 Disorientation, unspecified: Secondary | ICD-10-CM | POA: Diagnosis not present

## 2015-08-08 LAB — GLUCOSE, CAPILLARY
Glucose-Capillary: 133 mg/dL — ABNORMAL HIGH (ref 65–99)
Glucose-Capillary: 198 mg/dL — ABNORMAL HIGH (ref 65–99)
Glucose-Capillary: 200 mg/dL — ABNORMAL HIGH (ref 65–99)
Glucose-Capillary: 164 mg/dL — ABNORMAL HIGH (ref 65–99)

## 2015-08-09 DIAGNOSIS — R410 Disorientation, unspecified: Secondary | ICD-10-CM | POA: Diagnosis not present

## 2015-08-09 LAB — GLUCOSE, CAPILLARY
Glucose-Capillary: 125 mg/dL — ABNORMAL HIGH (ref 65–99)
Glucose-Capillary: 127 mg/dL — ABNORMAL HIGH (ref 65–99)
Glucose-Capillary: 132 mg/dL — ABNORMAL HIGH (ref 65–99)
Glucose-Capillary: 149 mg/dL — ABNORMAL HIGH (ref 65–99)
Glucose-Capillary: 222 mg/dL — ABNORMAL HIGH (ref 65–99)

## 2015-08-10 DIAGNOSIS — R410 Disorientation, unspecified: Secondary | ICD-10-CM | POA: Diagnosis not present

## 2015-08-12 LAB — GLUCOSE, CAPILLARY
Glucose-Capillary: 145 mg/dL — ABNORMAL HIGH (ref 65–99)
Glucose-Capillary: 164 mg/dL — ABNORMAL HIGH (ref 65–99)
Glucose-Capillary: 162 mg/dL — ABNORMAL HIGH (ref 65–99)
Glucose-Capillary: 179 mg/dL — ABNORMAL HIGH (ref 65–99)
Glucose-Capillary: 129 mg/dL — ABNORMAL HIGH (ref 65–99)
Glucose-Capillary: 163 mg/dL — ABNORMAL HIGH (ref 65–99)
Glucose-Capillary: 168 mg/dL — ABNORMAL HIGH (ref 65–99)
Glucose-Capillary: 182 mg/dL — ABNORMAL HIGH (ref 65–99)
Glucose-Capillary: 130 mg/dL — ABNORMAL HIGH (ref 65–99)
Glucose-Capillary: 155 mg/dL — ABNORMAL HIGH (ref 65–99)
Glucose-Capillary: 172 mg/dL — ABNORMAL HIGH (ref 65–99)

## 2015-08-13 DIAGNOSIS — R410 Disorientation, unspecified: Secondary | ICD-10-CM | POA: Diagnosis not present

## 2015-08-13 LAB — GLUCOSE, CAPILLARY
Glucose-Capillary: 110 mg/dL — ABNORMAL HIGH (ref 65–99)
Glucose-Capillary: 194 mg/dL — ABNORMAL HIGH (ref 65–99)
Glucose-Capillary: 157 mg/dL — ABNORMAL HIGH (ref 65–99)
Glucose-Capillary: 140 mg/dL — ABNORMAL HIGH (ref 65–99)

## 2015-08-14 DIAGNOSIS — R410 Disorientation, unspecified: Secondary | ICD-10-CM | POA: Diagnosis not present

## 2015-08-14 LAB — GLUCOSE, CAPILLARY
Glucose-Capillary: 128 mg/dL — ABNORMAL HIGH (ref 65–99)
Glucose-Capillary: 181 mg/dL — ABNORMAL HIGH (ref 65–99)
Glucose-Capillary: 186 mg/dL — ABNORMAL HIGH (ref 65–99)
Glucose-Capillary: 168 mg/dL — ABNORMAL HIGH (ref 65–99)

## 2015-08-15 DIAGNOSIS — R410 Disorientation, unspecified: Secondary | ICD-10-CM | POA: Diagnosis not present

## 2015-08-15 LAB — GLUCOSE, CAPILLARY
Glucose-Capillary: 147 mg/dL — ABNORMAL HIGH (ref 65–99)
Glucose-Capillary: 165 mg/dL — ABNORMAL HIGH (ref 65–99)
Glucose-Capillary: 137 mg/dL — ABNORMAL HIGH (ref 65–99)
Glucose-Capillary: 196 mg/dL — ABNORMAL HIGH (ref 65–99)

## 2015-08-17 DIAGNOSIS — R410 Disorientation, unspecified: Secondary | ICD-10-CM | POA: Diagnosis not present

## 2015-08-17 LAB — GLUCOSE, CAPILLARY
Glucose-Capillary: 124 mg/dL — ABNORMAL HIGH (ref 65–99)
Glucose-Capillary: 152 mg/dL — ABNORMAL HIGH (ref 65–99)
Glucose-Capillary: 144 mg/dL — ABNORMAL HIGH (ref 65–99)
Glucose-Capillary: 125 mg/dL — ABNORMAL HIGH (ref 65–99)
Glucose-Capillary: 174 mg/dL — ABNORMAL HIGH (ref 65–99)
Glucose-Capillary: 163 mg/dL — ABNORMAL HIGH (ref 65–99)
Glucose-Capillary: 189 mg/dL — ABNORMAL HIGH (ref 65–99)

## 2015-08-18 DIAGNOSIS — R410 Disorientation, unspecified: Secondary | ICD-10-CM | POA: Diagnosis not present

## 2015-08-18 LAB — GLUCOSE, CAPILLARY
Glucose-Capillary: 142 mg/dL — ABNORMAL HIGH (ref 65–99)
Glucose-Capillary: 113 mg/dL — ABNORMAL HIGH (ref 65–99)
Glucose-Capillary: 162 mg/dL — ABNORMAL HIGH (ref 65–99)

## 2015-08-19 DIAGNOSIS — R410 Disorientation, unspecified: Secondary | ICD-10-CM | POA: Diagnosis not present

## 2015-08-19 LAB — GLUCOSE, CAPILLARY
Glucose-Capillary: 139 mg/dL — ABNORMAL HIGH (ref 65–99)
Glucose-Capillary: 180 mg/dL — ABNORMAL HIGH (ref 65–99)
Glucose-Capillary: 179 mg/dL — ABNORMAL HIGH (ref 65–99)

## 2015-08-20 DIAGNOSIS — R410 Disorientation, unspecified: Secondary | ICD-10-CM | POA: Diagnosis not present

## 2015-08-20 LAB — URINALYSIS COMPLETE WITH MICROSCOPIC (ARMC ONLY)
Bacteria, UA: NONE SEEN
Bilirubin Urine: NEGATIVE
Glucose, UA: NEGATIVE mg/dL
Hgb urine dipstick: NEGATIVE
Ketones, ur: NEGATIVE mg/dL
Nitrite: NEGATIVE
Protein, ur: NEGATIVE mg/dL
RBC / HPF: NONE SEEN RBC/hpf (ref 0–5)
Specific Gravity, Urine: 1.013 (ref 1.005–1.030)
pH: 5 (ref 5.0–8.0)

## 2015-08-20 LAB — GLUCOSE, CAPILLARY
Glucose-Capillary: 176 mg/dL — ABNORMAL HIGH (ref 65–99)
Glucose-Capillary: 108 mg/dL — ABNORMAL HIGH (ref 65–99)
Glucose-Capillary: 165 mg/dL — ABNORMAL HIGH (ref 65–99)

## 2015-08-21 DIAGNOSIS — R410 Disorientation, unspecified: Secondary | ICD-10-CM | POA: Diagnosis not present

## 2015-08-21 LAB — GLUCOSE, CAPILLARY
Glucose-Capillary: 160 mg/dL — ABNORMAL HIGH (ref 65–99)
Glucose-Capillary: 167 mg/dL — ABNORMAL HIGH (ref 65–99)
Glucose-Capillary: 184 mg/dL — ABNORMAL HIGH (ref 65–99)
Glucose-Capillary: 172 mg/dL — ABNORMAL HIGH (ref 65–99)

## 2015-08-22 DIAGNOSIS — R410 Disorientation, unspecified: Secondary | ICD-10-CM | POA: Diagnosis not present

## 2015-08-22 LAB — URINE CULTURE: Culture: 50000

## 2015-08-22 LAB — GLUCOSE, CAPILLARY
Glucose-Capillary: 103 mg/dL — ABNORMAL HIGH (ref 65–99)
Glucose-Capillary: 168 mg/dL — ABNORMAL HIGH (ref 65–99)
Glucose-Capillary: 162 mg/dL — ABNORMAL HIGH (ref 65–99)
Glucose-Capillary: 184 mg/dL — ABNORMAL HIGH (ref 65–99)

## 2015-08-23 DIAGNOSIS — R410 Disorientation, unspecified: Secondary | ICD-10-CM | POA: Diagnosis not present

## 2015-08-23 LAB — GLUCOSE, CAPILLARY: Glucose-Capillary: 126 mg/dL — ABNORMAL HIGH (ref 65–99)

## 2015-11-09 ENCOUNTER — Other Ambulatory Visit: Payer: Self-pay | Admitting: Vascular Surgery

## 2015-11-10 ENCOUNTER — Encounter
Admission: RE | Admit: 2015-11-10 | Discharge: 2015-11-10 | Disposition: A | Discharge status: Home / Self Care | Payer: Medicare Other | Source: Ambulatory Visit | Attending: Vascular Surgery | Admitting: Vascular Surgery

## 2015-11-10 DIAGNOSIS — Z01812 Encounter for preprocedural laboratory examination: Secondary | ICD-10-CM | POA: Insufficient documentation

## 2015-11-10 HISTORY — DX: Hypothyroidism, unspecified: E03.9

## 2015-11-10 HISTORY — DX: Gastro-esophageal reflux disease without esophagitis: K21.9

## 2015-11-10 HISTORY — DX: Chronic kidney disease, unspecified: N18.9

## 2015-11-10 LAB — PROTIME-INR
INR: 1.28
Prothrombin Time: 16.1 seconds — ABNORMAL HIGH (ref 11.4–15.0)

## 2015-11-10 LAB — CBC WITH DIFFERENTIAL/PLATELET
Basophils Absolute: 0 10*3/uL (ref 0–0.1)
Basophils Relative: 1 %
Eosinophils Absolute: 0.1 10*3/uL (ref 0–0.7)
Eosinophils Relative: 1 %
HCT: 34.1 % — ABNORMAL LOW (ref 35.0–47.0)
Hemoglobin: 11.2 g/dL — ABNORMAL LOW (ref 12.0–16.0)
Lymphocytes Relative: 30 %
Lymphs Abs: 2 10*3/uL (ref 1.0–3.6)
MCH: 28.7 pg (ref 26.0–34.0)
MCHC: 33 g/dL (ref 32.0–36.0)
MCV: 87 fL (ref 80.0–100.0)
Monocytes Absolute: 0.6 10*3/uL (ref 0.2–0.9)
Monocytes Relative: 9 %
Neutro Abs: 4 10*3/uL (ref 1.4–6.5)
Neutrophils Relative %: 59 %
Platelets: 245 10*3/uL (ref 150–440)
RBC: 3.92 MIL/uL (ref 3.80–5.20)
RDW: 14.8 % — ABNORMAL HIGH (ref 11.5–14.5)
WBC: 6.8 10*3/uL (ref 3.6–11.0)

## 2015-11-10 LAB — ABO/RH: ABO/RH(D): O POS

## 2015-11-10 LAB — BASIC METABOLIC PANEL
Anion gap: 9 (ref 5–15)
BUN: 57 mg/dL — ABNORMAL HIGH (ref 6–20)
CO2: 26 mmol/L (ref 22–32)
Calcium: 9.3 mg/dL (ref 8.9–10.3)
Chloride: 105 mmol/L (ref 101–111)
Creatinine, Ser: 3.23 mg/dL — ABNORMAL HIGH (ref 0.44–1.00)
GFR calc Af Amer: 14 mL/min — ABNORMAL LOW (ref >60)
GFR calc non Af Amer: 12 mL/min — ABNORMAL LOW (ref >60)
Glucose, Bld: 156 mg/dL — ABNORMAL HIGH (ref 65–99)
Potassium: 4 mmol/L (ref 3.5–5.1)
Sodium: 140 mmol/L (ref 135–145)

## 2015-11-10 LAB — TYPE AND SCREEN
ABO/RH(D): O POS
Antibody Screen: NEGATIVE

## 2015-11-10 LAB — SURGICAL PCR SCREEN
MRSA, PCR: NEGATIVE
Staphylococcus aureus: NEGATIVE

## 2015-11-10 LAB — APTT: aPTT: 27 seconds (ref 24–36)

## 2015-11-10 NOTE — Pre-Procedure Instructions (Signed)
Labs sent to anesthesia for review.

## 2015-11-10 NOTE — Patient Instructions (Signed)
  Your procedure is scheduled on: Wednesday 11/18/2015 Report to Day Surgery. 2ND FLOOR MEDICAL MALL ENTRANCE To find out your arrival time please call [phone] between 1PM - 3PM on Tuesday 11/17/2015.  Remember: Instructions that are not followed completely may result in serious medical risk, up to and including death, or upon the discretion of your surgeon and anesthesiologist your surgery may need to be rescheduled.    __X__ 1. Do not eat food or drink liquids after midnight. No gum chewing or hard candies.     __X__ 2. No Alcohol for 24 hours before or after surgery.   ____ 3. Bring all medications with you on the day of surgery if instructed.    __X__ 4. Notify your doctor if there is any change in your medical condition     (cold, fever, infections).     Do not wear jewelry, make-up, hairpins, clips or nail polish.  Do not wear lotions, powders, or perfumes.   Do not shave 48 hours prior to surgery. Men may shave face and neck.  Do not bring valuables to the hospital.    [facility] is not responsible for any belongings or valuables.               Contacts, dentures or bridgework may not be worn into surgery.  Leave your suitcase in the car. After surgery it may be brought to your room.  For patients admitted to the hospital, discharge time is determined by your                treatment team.   Patients discharged the day of surgery will not be allowed to drive home.   Please read over the following fact sheets that you were given:   MRSA Information and Surgical Site Infection Prevention   __X__ Take these medicines the morning of surgery with A SIP OF WATER:    1. FENOFIBRATE/TRICOR  2. LEVOTHYROXINE/SYNTHROID  3. MEOPROLOL  4. PANTOPRAZOLE/PROTONIX  5. VALSARTAN/DIOVAN  6.  ____ Fleet Enema (as directed)   __X__ Use CHG Soap as directed  __X__ Use inhalers on the day of surgery  ____ Stop metformin 2 days prior to surgery    ____ Take 1/2 of usual insulin  dose the night before surgery and none on the morning of surgery.   ____ Stop Coumadin/Plavix/aspirin on   __X__ Stop Anti-inflammatories on STOP MELOXICAM/MOBIC 7 DAYS BEFORE SURGERY   ____ Stop supplements until after surgery.    ____ Bring C-Pap to the hospital.

## 2015-11-10 NOTE — Pre-Procedure Instructions (Signed)
Labs sent to Dr. Lucky Cowboy for review.

## 2015-11-18 ENCOUNTER — Ambulatory Visit: Payer: Medicare Other | Admitting: Certified Registered Nurse Anesthetist

## 2015-11-18 ENCOUNTER — Ambulatory Visit
Admission: RE | Admit: 2015-11-18 | Discharge: 2015-11-18 | Disposition: A | Discharge status: Home / Self Care | Payer: Medicare Other | Source: Ambulatory Visit | Attending: Vascular Surgery | Admitting: Vascular Surgery

## 2015-11-18 ENCOUNTER — Encounter: Payer: Self-pay | Admitting: *Deleted

## 2015-11-18 ENCOUNTER — Encounter
Admission: RE | Disposition: A | Discharge status: Home / Self Care | Payer: Self-pay | Source: Ambulatory Visit | Attending: Vascular Surgery

## 2015-11-18 DIAGNOSIS — I129 Hypertensive chronic kidney disease with stage 1 through stage 4 chronic kidney disease, or unspecified chronic kidney disease: Secondary | ICD-10-CM | POA: Insufficient documentation

## 2015-11-18 DIAGNOSIS — N184 Chronic kidney disease, stage 4 (severe): Secondary | ICD-10-CM | POA: Insufficient documentation

## 2015-11-18 DIAGNOSIS — Z96641 Presence of right artificial hip joint: Secondary | ICD-10-CM | POA: Insufficient documentation

## 2015-11-18 DIAGNOSIS — Z8249 Family history of ischemic heart disease and other diseases of the circulatory system: Secondary | ICD-10-CM | POA: Insufficient documentation

## 2015-11-18 DIAGNOSIS — I499 Cardiac arrhythmia, unspecified: Secondary | ICD-10-CM | POA: Diagnosis not present

## 2015-11-18 DIAGNOSIS — J449 Chronic obstructive pulmonary disease, unspecified: Secondary | ICD-10-CM | POA: Diagnosis not present

## 2015-11-18 DIAGNOSIS — Z79899 Other long term (current) drug therapy: Secondary | ICD-10-CM | POA: Diagnosis not present

## 2015-11-18 DIAGNOSIS — E1122 Type 2 diabetes mellitus with diabetic chronic kidney disease: Secondary | ICD-10-CM | POA: Insufficient documentation

## 2015-11-18 DIAGNOSIS — I7090 Unspecified atherosclerosis: Secondary | ICD-10-CM | POA: Insufficient documentation

## 2015-11-18 DIAGNOSIS — Z87891 Personal history of nicotine dependence: Secondary | ICD-10-CM | POA: Diagnosis not present

## 2015-11-18 DIAGNOSIS — Z833 Family history of diabetes mellitus: Secondary | ICD-10-CM | POA: Diagnosis not present

## 2015-11-18 HISTORY — PX: AV FISTULA PLACEMENT: SHX1204

## 2015-11-18 LAB — GLUCOSE, CAPILLARY
Glucose-Capillary: 186 mg/dL — ABNORMAL HIGH (ref 65–99)
Glucose-Capillary: 151 mg/dL — ABNORMAL HIGH (ref 65–99)

## 2015-11-18 SURGERY — ARTERIOVENOUS (AV) FISTULA CREATION
Anesthesia: General | Site: Arm Lower | Laterality: Right | Wound class: Clean

## 2015-11-18 MED ORDER — ONDANSETRON HCL 4 MG/2ML IJ SOLN: 4.0000 mg | Freq: Once | INTRAMUSCULAR | Status: DC | PRN

## 2015-11-18 MED ORDER — HYDROCODONE-ACETAMINOPHEN 5-325 MG PO TABS: 1.0000 | ORAL_TABLET | Freq: Four times a day (QID) | ORAL | Status: DC | PRN

## 2015-11-18 MED ORDER — SODIUM CHLORIDE 0.9 % IV SOLN
INTRAVENOUS | Status: DC
  Administered 2015-11-18: 08:00:00 via INTRAVENOUS

## 2015-11-18 MED ORDER — PHENYLEPHRINE HCL 10 MG/ML IJ SOLN
10000.0000 ug | INTRAMUSCULAR | Status: DC | PRN
  Administered 2015-11-18: 20 ug/min via INTRAVENOUS

## 2015-11-18 MED ORDER — HEPARIN SODIUM (PORCINE) 1000 UNIT/ML IJ SOLN
INTRAMUSCULAR | Status: DC | PRN
  Administered 2015-11-18: 3000 [IU] via INTRAVENOUS

## 2015-11-18 MED ORDER — FENTANYL CITRATE (PF) 100 MCG/2ML IJ SOLN
25.0000 ug | INTRAMUSCULAR | Status: DC | PRN
  Administered 2015-11-18 (×4): 25 ug via INTRAVENOUS

## 2015-11-18 MED ORDER — CEFAZOLIN SODIUM-DEXTROSE 2-3 GM-% IV SOLR
2.0000 g | INTRAVENOUS | Status: AC
  Administered 2015-11-18: 2 g via INTRAVENOUS

## 2015-11-18 MED ORDER — FENTANYL CITRATE (PF) 100 MCG/2ML IJ SOLN
INTRAMUSCULAR | Status: DC | PRN
  Administered 2015-11-18 (×8): 25 ug via INTRAVENOUS

## 2015-11-18 MED ORDER — LIDOCAINE HCL (CARDIAC) 20 MG/ML IV SOLN
INTRAVENOUS | Status: DC | PRN
  Administered 2015-11-18: 60 mg via INTRAVENOUS

## 2015-11-18 MED ORDER — HYDROCODONE-ACETAMINOPHEN 5-325 MG PO TABS
ORAL_TABLET | ORAL | Status: AC
  Filled 2015-11-18: qty 1

## 2015-11-18 MED ORDER — ONDANSETRON HCL 4 MG/2ML IJ SOLN
INTRAMUSCULAR | Status: DC | PRN
  Administered 2015-11-18: 4 mg via INTRAVENOUS

## 2015-11-18 MED ORDER — CEFAZOLIN SODIUM-DEXTROSE 2-3 GM-% IV SOLR
INTRAVENOUS | Status: AC
  Filled 2015-11-18: qty 50

## 2015-11-18 MED ORDER — GLYCOPYRROLATE 0.2 MG/ML IJ SOLN
INTRAMUSCULAR | Status: DC | PRN
  Administered 2015-11-18: 0.2 mg via INTRAVENOUS

## 2015-11-18 MED ORDER — HYDROCODONE-ACETAMINOPHEN 5-325 MG PO TABS
1.0000 | ORAL_TABLET | Freq: Four times a day (QID) | ORAL | Status: DC | PRN
  Administered 2015-11-18: 1 via ORAL

## 2015-11-18 MED ORDER — SODIUM CHLORIDE 0.9 % IV SOLN
INTRAVENOUS | Status: DC | PRN
  Administered 2015-11-18: 10 mL via INTRAMUSCULAR
  Administered 2015-11-18: 10:00:00 via INTRAMUSCULAR

## 2015-11-18 MED ORDER — PROPOFOL 10 MG/ML IV BOLUS
INTRAVENOUS | Status: DC | PRN
  Administered 2015-11-18: 30 mg via INTRAVENOUS
  Administered 2015-11-18: 100 mg via INTRAVENOUS
  Administered 2015-11-18: 50 mg via INTRAVENOUS

## 2015-11-18 MED ORDER — BUPIVACAINE-EPINEPHRINE (PF) 0.5% -1:200000 IJ SOLN
INTRAMUSCULAR | Status: AC
  Filled 2015-11-18: qty 30

## 2015-11-18 MED ORDER — HEPARIN SODIUM (PORCINE) 5000 UNIT/ML IJ SOLN
INTRAMUSCULAR | Status: AC
  Filled 2015-11-18: qty 1

## 2015-11-18 MED ORDER — PAPAVERINE HCL 30 MG/ML IJ SOLN
INTRAMUSCULAR | Status: AC
  Filled 2015-11-18: qty 2

## 2015-11-18 MED ORDER — FENTANYL CITRATE (PF) 100 MCG/2ML IJ SOLN
INTRAMUSCULAR | Status: AC
  Filled 2015-11-18: qty 2

## 2015-11-18 SURGICAL SUPPLY — 50 items
BAG DECANTER FOR FLEXI CONT (MISCELLANEOUS) ×2 IMPLANT
BLADE SURG SZ11 CARB STEEL (BLADE) ×2 IMPLANT
BOOT SUTURE AID YELLOW STND (SUTURE) ×2 IMPLANT
BRUSH SCRUB 4% CHG (MISCELLANEOUS) ×2 IMPLANT
CANISTER SUCT 1200ML W/VALVE (MISCELLANEOUS) ×2 IMPLANT
CHLORAPREP W/TINT 26ML (MISCELLANEOUS) ×2 IMPLANT
CLIP SPRNG 6 S-JAW DBL (CLIP) ×1 IMPLANT
CLIP SPRNG 6MM S-JAW DBL (CLIP) ×2
ELECT CAUTERY BLADE 6.4 (BLADE) ×2 IMPLANT
ELECT REM PT RETURN 9FT ADLT (ELECTROSURGICAL) ×2
ELECTRODE REM PT RTRN 9FT ADLT (ELECTROSURGICAL) ×1 IMPLANT
GEL ULTRASOUND 20GR AQUASONIC (MISCELLANEOUS) IMPLANT
GLOVE BIO SURGEON STRL SZ7 (GLOVE) ×2 IMPLANT
GOWN STRL REUS W/ TWL LRG LVL3 (GOWN DISPOSABLE) ×1 IMPLANT
GOWN STRL REUS W/ TWL XL LVL3 (GOWN DISPOSABLE) ×1 IMPLANT
GOWN STRL REUS W/TWL LRG LVL3 (GOWN DISPOSABLE) ×2
GOWN STRL REUS W/TWL XL LVL3 (GOWN DISPOSABLE) ×2
HEMOSTAT SURGICEL 2X3 (HEMOSTASIS) ×2 IMPLANT
IV NS 500ML (IV SOLUTION) ×2
IV NS 500ML BAXH (IV SOLUTION) ×1 IMPLANT
KIT RM TURNOVER STRD PROC AR (KITS) ×2 IMPLANT
LABEL OR SOLS (LABEL) ×2 IMPLANT
LIQUID BAND (GAUZE/BANDAGES/DRESSINGS) ×2 IMPLANT
LOOP RED MAXI  1X406MM (MISCELLANEOUS) ×1
LOOP VESSEL MAXI 1X406 RED (MISCELLANEOUS) ×1 IMPLANT
LOOP VESSEL MINI 0.8X406 BLUE (MISCELLANEOUS) ×1 IMPLANT
LOOPS BLUE MINI 0.8X406MM (MISCELLANEOUS) ×1
NDL FILTER BLUNT 18X1 1/2 (NEEDLE) ×1 IMPLANT
NEEDLE FILTER BLUNT 18X 1/2SAF (NEEDLE) ×1
NEEDLE FILTER BLUNT 18X1 1/2 (NEEDLE) ×1 IMPLANT
NEEDLE HYPO 30X.5 LL (NEEDLE) IMPLANT
NS IRRIG 500ML POUR BTL (IV SOLUTION) ×2 IMPLANT
PACK EXTREMITY ARMC (MISCELLANEOUS) ×2 IMPLANT
PAD PREP 24X41 OB/GYN DISP (PERSONAL CARE ITEMS) ×2 IMPLANT
SOLUTION CELL SAVER (CLIP) ×1 IMPLANT
STOCKINETTE STRL 4IN 9604848 (GAUZE/BANDAGES/DRESSINGS) ×2 IMPLANT
SUT MNCRL AB 4-0 PS2 18 (SUTURE) ×2 IMPLANT
SUT PROLENE 6 0 BV (SUTURE) ×8 IMPLANT
SUT SILK 2 0 (SUTURE) ×2
SUT SILK 2-0 18XBRD TIE 12 (SUTURE) ×1 IMPLANT
SUT SILK 3 0 (SUTURE) ×2
SUT SILK 3-0 18XBRD TIE 12 (SUTURE) ×1 IMPLANT
SUT SILK 4 0 (SUTURE) ×2
SUT SILK 4-0 18XBRD TIE 12 (SUTURE) ×1 IMPLANT
SUT VIC AB 3-0 SH 27 (SUTURE) ×4
SUT VIC AB 3-0 SH 27X BRD (SUTURE) ×2 IMPLANT
SYR 20CC LL (SYRINGE) ×2 IMPLANT
SYR 3ML LL SCALE MARK (SYRINGE) ×2 IMPLANT
SYR TB 1ML 27GX1/2 LL (SYRINGE) IMPLANT
TOWEL OR 17X26 4PK STRL BLUE (TOWEL DISPOSABLE) IMPLANT

## 2015-11-18 NOTE — H&P (Signed)
[facility] History & Physical Update  The patient was interviewed and re-examined.  The patient's previous History and Physical has been reviewed and is unchanged.  There is no change in the plan of care. We plan to proceed with the scheduled procedure.  DEW,JASON, MD  11/18/2015, 9:14 AM

## 2015-11-18 NOTE — Discharge Instructions (Signed)

## 2015-11-18 NOTE — Transfer of Care (Signed)
Immediate Anesthesia Transfer of Care Note  Patient: Denise [name]  Procedure(s) Performed: Procedure(s): ARTERIOVENOUS (AV) FISTULA CREATION (Right)  Patient Location: PACU  Anesthesia Type:General  Level of Consciousness: sedated  Airway & Oxygen Therapy: Patient Spontanous Breathing and Patient connected to face mask oxygen  Post-op Assessment: Report given to RN and Post -op Vital signs reviewed and stable  Post vital signs: Reviewed and stable  Last Vitals:  Filed Vitals:   11/18/15 0818  BP: 136/77  Pulse: 64  Temp: 36.7 C  Resp: 16    Complications: No apparent anesthesia complications

## 2015-11-18 NOTE — Anesthesia Preprocedure Evaluation (Addendum)
Anesthesia Evaluation  Patient identified by MRN, date of birth, ID band Patient awake    Reviewed: Allergy & Precautions, H&P , NPO status , Patient's Chart, lab work & pertinent test results, reviewed documented beta blocker date and time   Airway Mallampati: II  TM Distance: >3 FB Neck ROM: full    Dental  (+) Teeth Intact   Pulmonary neg pulmonary ROS, COPD, former smoker,    Pulmonary exam normal        Cardiovascular Exercise Tolerance: Good hypertension, negative cardio ROS Normal cardiovascular exam Rate:Normal     Neuro/Psych 2 weeks ago c/o lightheadedness and this was reported to Dr. Candiss Norse who had her reduce her bp pill in half, this helped.  No Hx of TIA sx etc.  Exam yields no bruit.  negative neurological ROS  negative psych ROS   GI/Hepatic negative GI ROS, Neg liver ROS, GERD  ,  Endo/Other  negative endocrine ROSdiabetesHypothyroidism   Renal/GU Renal diseasenegative Renal ROS  negative genitourinary   Musculoskeletal   Abdominal   Peds  Hematology negative hematology ROS (+)   Anesthesia Other Findings   Reproductive/Obstetrics negative OB ROS                            Anesthesia Physical Anesthesia Plan  ASA: III  Anesthesia Plan: General LMA   Post-op Pain Management:    Induction:   Airway Management Planned:   Additional Equipment:   Intra-op Plan:   Post-operative Plan:   Informed Consent: I have reviewed the patients History and Physical, chart, labs and discussed the procedure including the risks, benefits and alternatives for the proposed anesthesia with the patient or authorized representative who has indicated his/her understanding and acceptance.     Plan Discussed with: CRNA  Anesthesia Plan Comments:         Anesthesia Quick Evaluation

## 2015-11-18 NOTE — Op Note (Signed)
[facility]   OPERATIVE NOTE   PROCEDURE: Right brachiocephalic arteriovenous fistula placement  PRE-OPERATIVE DIAGNOSIS: 1.  CKD stage 4        POST-OPERATIVE DIAGNOSIS: 1. CKD stage 4       SURGEON: Leotis Pain, MD  ASSISTANT(S): none  ANESTHESIA: general  ESTIMATED BLOOD LOSS: minimal  FINDING(S): Adequate cephalic vein for fistula creation  SPECIMEN(S):  none  INDICATIONS:   Denise [name] is a 82 y.o. female who presents with renal failure in need of pemanent dialysis acces.  The patient is scheduled for right arm AVF placement.  The patient is aware the risks include but are not limited to: bleeding, infection, steal syndrome, nerve damage, ischemic monomelic neuropathy, failure to mature, and need for additional procedures.  The patient is aware of the risks of the procedure and elects to proceed forward.  DESCRIPTION: After full informed written consent was obtained from the patient, the patient was brought back to the operating room and placed supine upon the operating table.  Prior to induction, the patient received IV antibiotics.   After obtaining adequate anesthesia, the patient was then prepped and draped in the standard fashion for a right arm access procedure.  I made a curvilinear incision at the level of the antecubital fossa and dissected through the subcutaneous tissue and fascia to gain exposure of the brachial artery.  This was noted to be patent and adequate in size for fistula creation.  This was dissected out proximally and distally and prepared for control with vessel loops .  I then dissected out the cephalic vein.  This was noted to be patent and adequate in size for fistula creation.  I then gave the patient 3000 units of intravenous heparin.  The vein was marked for orientation and the distal segment of the vein was ligated with a  2-0 silk, and the vein was transected.  I then instilled the heparinized saline into the vein and  clamped it.  At this point, I reset my exposure of the brachial artery and pulled up control on the vessel loops.  I made an arteriotomy with a #11 blade, and then I extended the arteriotomy with a Potts scissor.  I injected heparinized saline proximal and distal to this arteriotomy.  The vein was then sewn to the artery in an end-to-side configuration with a running stitch of 6-0 Prolene.  Prior to completing this anastomosis, I allowed the vein and artery to backbleed.  There was no evidence of clot from any vessels.  I completed the anastomosis in the usual fashion and then released all vessel loops and clamps.  There was a palpable  thrill in the venous outflow, and there was a palpable pulse in the artery distal to the anastomosis.  At this point, I irrigated out the surgical wound.  Surgicel was placed. There was no further active bleeding.  The subcutaneous tissue was reapproximated with a running stitch of 3-0 Vicryl.  The skin was then closed with a 4-0 Monocryl suture.  The skin was then cleaned, dried, and reinforced with Dermabond.  The patient tolerated this procedure well and was taken to the recovery room in stable condition  COMPLICATIONS: None  CONDITION: Stable   DEW,JASON    11/18/2015, 10:33 AM

## 2015-11-19 NOTE — Anesthesia Postprocedure Evaluation (Signed)
Anesthesia Post Note  Patient: FINDLAY DRAUS  Procedure(s) Performed: Procedure(s) (LRB): ARTERIOVENOUS (AV) FISTULA CREATION (Right)  Patient location during evaluation: PACU Anesthesia Type: General Level of consciousness: awake and alert Pain management: pain level controlled Vital Signs Assessment: post-procedure vital signs reviewed and stable Respiratory status: spontaneous breathing, nonlabored ventilation, respiratory function stable and patient connected to nasal cannula oxygen Cardiovascular status: blood pressure returned to baseline and stable Postop Assessment: no signs of nausea or vomiting Anesthetic complications: no    Last Vitals:  Filed Vitals:   11/18/15 1125 11/18/15 1151  BP: 141/55 126/53  Pulse: 61 57  Temp:    Resp: 14 16    Last Pain:  Filed Vitals:   11/19/15 0853  PainSc: 6                  Molli Barrows

## 2016-01-25 ENCOUNTER — Other Ambulatory Visit: Payer: Self-pay | Admitting: Internal Medicine

## 2016-01-25 DIAGNOSIS — Z1231 Encounter for screening mammogram for malignant neoplasm of breast: Secondary | ICD-10-CM

## 2016-02-04 ENCOUNTER — Ambulatory Visit
Admission: RE | Admit: 2016-02-04 | Discharge: 2016-02-04 | Disposition: A | Discharge status: Home / Self Care | Payer: Medicare Other | Source: Ambulatory Visit | Attending: Internal Medicine | Admitting: Internal Medicine

## 2016-02-04 ENCOUNTER — Other Ambulatory Visit: Payer: Self-pay | Admitting: Internal Medicine

## 2016-02-04 DIAGNOSIS — Z1231 Encounter for screening mammogram for malignant neoplasm of breast: Secondary | ICD-10-CM

## 2017-01-04 ENCOUNTER — Ambulatory Visit (INDEPENDENT_AMBULATORY_CARE_PROVIDER_SITE_OTHER): Payer: Medicare Other | Admitting: Vascular Surgery

## 2017-01-04 ENCOUNTER — Encounter (INDEPENDENT_AMBULATORY_CARE_PROVIDER_SITE_OTHER): Payer: Self-pay | Admitting: Vascular Surgery

## 2017-01-04 VITALS — BP 173/69 | HR 61 | Resp 15 | Ht 66.0 in | Wt 179.0 lb

## 2017-01-04 DIAGNOSIS — E118 Type 2 diabetes mellitus with unspecified complications: Secondary | ICD-10-CM

## 2017-01-04 DIAGNOSIS — N189 Chronic kidney disease, unspecified: Secondary | ICD-10-CM

## 2017-01-04 DIAGNOSIS — E785 Hyperlipidemia, unspecified: Secondary | ICD-10-CM | POA: Diagnosis not present

## 2017-01-05 DIAGNOSIS — E118 Type 2 diabetes mellitus with unspecified complications: Secondary | ICD-10-CM | POA: Insufficient documentation

## 2017-01-05 DIAGNOSIS — E785 Hyperlipidemia, unspecified: Secondary | ICD-10-CM | POA: Insufficient documentation

## 2017-01-05 DIAGNOSIS — E1169 Type 2 diabetes mellitus with other specified complication: Secondary | ICD-10-CM | POA: Insufficient documentation

## 2017-01-05 DIAGNOSIS — N189 Chronic kidney disease, unspecified: Secondary | ICD-10-CM | POA: Insufficient documentation

## 2017-01-05 NOTE — Progress Notes (Signed)
Subjective:    Patient ID: Denise [name], female    DOB: [birth year], 83 y.o.   MRN: [id number] Chief Complaint  Patient presents with  . Re-evaluation    Right arm swelling around fistula   Patient presents with a chief complaint of right upper extremity dialysis access discomfort. The patient is s/p a right fistula creation however has not had to start dialysis as her kidney function has remained stable. She experiences intermittent discomfort at the access site. Denies any hand pain or ulceration. She denies fever, nausea or vomiting.    Review of Systems  Constitutional: Negative.   HENT: Negative.   Eyes: Negative.   Respiratory: Negative.   Cardiovascular: Negative.   Gastrointestinal: Negative.   Endocrine: Negative.   Genitourinary:       Fistula Site Pain  Musculoskeletal: Negative.   Skin: Negative.   Allergic/Immunologic: Negative.   Neurological: Negative.   Hematological: Negative.   Psychiatric/Behavioral: Negative.       Objective:   Physical Exam  Constitutional: She is oriented to person, place, and time. She appears well-developed and well-nourished.  HENT:  Head: Normocephalic and atraumatic.  Right Ear: External ear normal.  Left Ear: External ear normal.  Eyes: Conjunctivae are normal. Pupils are equal, round, and reactive to light.  Neck: Normal range of motion.  Cardiovascular: Normal rate, regular rhythm and normal heart sounds.   Pulses:      Radial pulses are 2+ on the right side, and 2+ on the left side.  Right Upper Extremity Fistula: Good bruit and thrill. Tracks down into antecubital fossa. Hand with 2+ pulses and warm.   Pulmonary/Chest: Effort normal and breath sounds normal.  Musculoskeletal: Normal range of motion. She exhibits edema (Mild Edema Bilaterally).  Neurological: She is alert and oriented to person, place, and time.  Skin: Skin is warm and dry.  Psychiatric: She has a normal mood and affect. Her behavior is normal. Judgment  and thought content normal.   BP (!) 173/69 (BP Location: Left Arm)   Pulse 61   Resp 15   Ht 5\' 6"  (1.676 m)   Wt 179 lb (81.2 kg)   BMI 28.89 kg/m   Past Medical History:  Diagnosis Date  . Arthritis   . Cataract   . Chronic kidney disease   . COPD (chronic obstructive pulmonary disease) ([place])   . Diabetes mellitus without complication ([place])   . Emphysema of lung ([place])   . GERD (gastroesophageal reflux disease)   . Hypertension   . Hypothyroidism    Social History   Social History  . Marital status: Widowed    Spouse name: N/A  . Number of children: N/A  . Years of education: N/A   Occupational History  . Not on file.   Social History Main Topics  . Smoking status: Former Smoker    Quit date: 07/28/2013  . Smokeless tobacco: Never Used  . Alcohol use No  . Drug use: No  . Sexual activity: No   Other Topics Concern  . Not on file   Social History Narrative  . No narrative on file   Past Surgical History:  Procedure Laterality Date  . AV FISTULA PLACEMENT Right 11/18/2015   Procedure: ARTERIOVENOUS (AV) FISTULA CREATION;  Surgeon: Algernon Huxley, MD;  Location: [facility] ORS;  Service: Vascular;  Laterality: Right;  . CHOLECYSTECTOMY    . GALLBLADDER SURGERY    . HIP ARTHROPLASTY Right 07/31/2015   Procedure: ARTHROPLASTY BIPOLAR HIP (HEMIARTHROPLASTY);  Surgeon: Corky Mull, MD;  Location: [facility] ORS;  Service: Orthopedics;  Laterality: Right;   Family History  Problem Relation Age of Onset  . Diabetes Mother   . Heart attack Mother   . Stomach cancer Father   . Colon cancer Sister   . Diabetes Sister   . Heart attack Brother   . Colon cancer Brother   . Breast cancer Neg Hx    Allergies  Allergen Reactions  . Iodinated Diagnostic Agents Anaphylaxis, Other (See Comments) and Rash  . Benzocaine-Chloroxylenol-Hc     Years ago  . Benzonatate Rash    Note: pruritis      Assessment & Plan:  Patient presents with a chief complaint of right upper extremity  dialysis access discomfort. The patient is s/p a right fistula creation however has not had to start dialysis as her kidney function has remained stable. She experiences intermittent discomfort at the access site. Denies any hand pain or ulceration. She denies fever, nausea or vomiting.   1. Chronic kidney disease, unspecified CKD stage - Stable Patient with intermittent discomfort from access site. Has not used access as kidney function is stable. Physical exam unremarkable with exception of of fistula tracking down into antecubital fossa which may be causing discomfort when bending her arm. Will order a HDA to assess fistula patency.   - VAS US DUPLEX DIALYSIS ACCESS (AVF,AVG); Future  2. Hyperlipidemia, unspecified hyperlipidemia type - Stable Encouraged good control as its slows the progression of atherosclerotic disease  3. Type 2 diabetes mellitus with complication, unspecified long term insulin use status (HCC) - Stable Encouraged good control as its slows the progression of atherosclerotic disease  Current Outpatient Prescriptions on File Prior to Visit  Medication Sig Dispense Refill  . atorvastatin (LIPITOR) 40 MG tablet Take 40 mg by mouth at bedtime.     . bisacodyl (DULCOLAX) 10 MG suppository Place 1 suppository (10 mg total) rectally daily as needed for moderate constipation. 12 suppository 0  . Cholecalciferol (VITAMIN D-3 PO) Take 4,000 Units by mouth daily.    . fenofibrate (TRICOR) 145 MG tablet Take 145 mg by mouth daily.    . Fluticasone Furoate-Vilanterol (BREO ELLIPTA) 100-25 MCG/INH AEPB Inhale 1 puff into the lungs daily.    . furosemide (LASIX) 40 MG tablet Take 40 mg by mouth.    Marland Kitchen HYDROcodone-acetaminophen (NORCO) 5-325 MG tablet Take 1 tablet by mouth every 6 (six) hours as needed for moderate pain. 30 tablet 0  . levothyroxine (SYNTHROID, LEVOTHROID) 137 MCG tablet Take 137 mcg by mouth daily.     . meloxicam (MOBIC) 7.5 MG tablet Take 7.5 mg by mouth 2 (two)  times daily after a meal.     . METOPROLOL SUCCINATE ER PO Take 1 tablet by mouth daily.    . pantoprazole (PROTONIX) 40 MG tablet Take 40 mg by mouth daily.    Marland Kitchen senna (SENOKOT) 8.6 MG tablet Take 1 tablet by mouth daily.    Marland Kitchen tiotropium (SPIRIVA) 18 MCG inhalation capsule Place 18 mcg into inhaler and inhale daily.    . valsartan (DIOVAN) 160 MG tablet Take 160 mg by mouth daily.    . verapamil (CALAN-SR) 240 MG CR tablet Take 240 mg by mouth at bedtime.     No current facility-administered medications on file prior to visit.     There are no Patient Instructions on file for this visit. No Follow-up on file.   KIMBERLY A STEGMAYER, PA-C

## 2017-01-05 NOTE — Progress Notes (Signed)
Patient presents with a chief complaint of right upper extremity dialysis access discomfort. The patient is s/p a right fistula creation however has not had to start dialysis as her kidney function has remained stable. She experiences intermittent discomfort at the access site. Denies any hand pain or ulceration. She denies fever, nausea or vomiting.

## 2017-03-01 ENCOUNTER — Ambulatory Visit (INDEPENDENT_AMBULATORY_CARE_PROVIDER_SITE_OTHER): Payer: Medicare Other

## 2017-03-01 ENCOUNTER — Encounter (INDEPENDENT_AMBULATORY_CARE_PROVIDER_SITE_OTHER): Payer: Self-pay | Admitting: Vascular Surgery

## 2017-03-01 ENCOUNTER — Ambulatory Visit (INDEPENDENT_AMBULATORY_CARE_PROVIDER_SITE_OTHER): Payer: Medicare Other | Admitting: Vascular Surgery

## 2017-03-01 VITALS — BP 162/59 | HR 58 | Resp 16 | Wt 181.0 lb

## 2017-03-01 DIAGNOSIS — N186 End stage renal disease: Secondary | ICD-10-CM | POA: Diagnosis not present

## 2017-03-01 DIAGNOSIS — E118 Type 2 diabetes mellitus with unspecified complications: Secondary | ICD-10-CM

## 2017-03-01 DIAGNOSIS — N189 Chronic kidney disease, unspecified: Secondary | ICD-10-CM

## 2017-03-01 DIAGNOSIS — E785 Hyperlipidemia, unspecified: Secondary | ICD-10-CM | POA: Diagnosis not present

## 2017-03-01 DIAGNOSIS — Z992 Dependence on renal dialysis: Secondary | ICD-10-CM | POA: Diagnosis not present

## 2017-04-11 DIAGNOSIS — Z992 Dependence on renal dialysis: Secondary | ICD-10-CM | POA: Insufficient documentation

## 2017-04-11 DIAGNOSIS — N186 End stage renal disease: Secondary | ICD-10-CM | POA: Insufficient documentation

## 2017-04-11 NOTE — Progress Notes (Signed)
Subjective:    Patient ID: Denise [name], female    DOB: 07/03/1930, 83 y.o.   MRN: [id number] Chief Complaint  Patient presents with  . Follow-up   Patient presents for a 6 month HDA follow-up. The patient underwent a duplex ultrasound of the AV access which was notable for a patent fistula without any significant hemodynamic stenosis. Patient reports her hemodialysis doppler flow is stable. The patient denies any issues with hemodialysis such as cannulation problems, increased bleeding, decrease in doppler flow or recirculation. The patient also denies any fistula skin breakdown, pain, edema, pallor or ulceration of the arm / hand.   Review of Systems  Constitutional: Negative.   HENT: Negative.   Eyes: Negative.   Respiratory: Negative.   Cardiovascular: Negative.   Gastrointestinal: Negative.   Endocrine: Negative.   Genitourinary: Negative.   Musculoskeletal: Negative.   Skin: Negative.   Allergic/Immunologic: Negative.   Neurological: Negative.   Hematological: Negative.   Psychiatric/Behavioral: Negative.       Objective:   Physical Exam  Constitutional: She is oriented to person, place, and time. She appears well-developed and well-nourished.  HENT:  Head: Normocephalic.  Eyes: Conjunctivae are normal. Pupils are equal, round, and reactive to light.  Neck: Normal range of motion.  Cardiovascular: Normal rate, regular rhythm and normal heart sounds.   Pulses:      Radial pulses are 2+ on the right side, and 2+ on the left side.  Right upper extremity AV fistula with good bruit and thrill  Pulmonary/Chest: Effort normal.  Neurological: She is alert and oriented to person, place, and time.  Skin: Skin is warm and dry.  Psychiatric: She has a normal mood and affect. Her behavior is normal. Judgment and thought content normal.  Vitals reviewed.   BP (!) 162/59   Pulse (!) 58   Resp 16   Wt 181 lb (82.1 kg)   BMI 29.21 kg/m   Past Medical History:  Diagnosis  Date  . Arthritis   . Cataract   . Chronic kidney disease   . COPD (chronic obstructive pulmonary disease) ([place])   . Diabetes mellitus without complication ([place])   . Emphysema of lung ([place])   . GERD (gastroesophageal reflux disease)   . Hypertension   . Hypothyroidism     Social History   Social History  . Marital status: Widowed    Spouse name: N/A  . Number of children: N/A  . Years of education: N/A   Occupational History  . Not on file.   Social History Main Topics  . Smoking status: Former Smoker    Quit date: 07/28/2013  . Smokeless tobacco: Never Used  . Alcohol use No  . Drug use: No  . Sexual activity: No   Other Topics Concern  . Not on file   Social History Narrative  . No narrative on file    Past Surgical History:  Procedure Laterality Date  . AV FISTULA PLACEMENT Right 11/18/2015   Procedure: ARTERIOVENOUS (AV) FISTULA CREATION;  Surgeon: Algernon Huxley, MD;  Location: [facility] ORS;  Service: Vascular;  Laterality: Right;  . CHOLECYSTECTOMY    . GALLBLADDER SURGERY    . HIP ARTHROPLASTY Right 07/31/2015   Procedure: ARTHROPLASTY BIPOLAR HIP (HEMIARTHROPLASTY);  Surgeon: Corky Mull, MD;  Location: [facility] ORS;  Service: Orthopedics;  Laterality: Right;    Family History  Problem Relation Age of Onset  . Diabetes Mother   . Heart attack Mother   . Stomach cancer  Father   . Colon cancer Sister   . Diabetes Sister   . Heart attack Brother   . Colon cancer Brother   . Breast cancer Neg Hx     Allergies  Allergen Reactions  . Iodinated Diagnostic Agents Anaphylaxis, Other (See Comments) and Rash  . Benzocaine-Chloroxylenol-Hc     Years ago  . Benzonatate Rash    Note: pruritis       Assessment & Plan:  Patient presents for a 6 month HDA follow-up. The patient underwent a duplex ultrasound of the AV access which was notable for a patent fistula without any significant hemodynamic stenosis. Patient reports her hemodialysis doppler flow is stable. The  patient denies any issues with hemodialysis such as cannulation problems, increased bleeding, decrease in doppler flow or recirculation. The patient also denies any fistula skin breakdown, pain, edema, pallor or ulceration of the arm / hand.  1. ESRD on dialysis [facility]) - stable Studies reviewed with patient. The patient is doing well and currently has adequate dialysis access. Duplex ultrasound of the AV access shows a patent access with no evidence of hemodynamically significant strictures or stenosis.  The patient should continue to have duplex ultrasounds of the dialysis access every 6 months. The patient was instructed to call the office in the interim if any issues with dialysis access / doppler flow, pain, edema, pallor, fistula skin breakdown or ulceration of the arm / hand occur. The patient expressed their understanding.  - VAS US RENAL ARTERY DUPLEX; Future  2. Hyperlipidemia, unspecified hyperlipidemia type - stable Encouraged good control as its slows the progression of atherosclerotic disease  3. Type 2 diabetes mellitus with complication, unspecified whether long term insulin use (HCC) - stable Encouraged good control as its slows the progression of atherosclerotic disease  Current Outpatient Prescriptions on File Prior to Visit  Medication Sig Dispense Refill  . atorvastatin (LIPITOR) 40 MG tablet Take 40 mg by mouth at bedtime.     . Cholecalciferol (VITAMIN D-3 PO) Take 4,000 Units by mouth daily.    . furosemide (LASIX) 40 MG tablet Take 40 mg by mouth.    . insulin glargine (LANTUS) 100 UNIT/ML injection Inject into the skin.    Marland Kitchen levothyroxine (SYNTHROID, LEVOTHROID) 137 MCG tablet Take 137 mcg by mouth daily.     Marland Kitchen METOPROLOL SUCCINATE ER PO Take 1 tablet by mouth daily.    . pantoprazole (PROTONIX) 40 MG tablet Take 40 mg by mouth daily.    Marland Kitchen tiotropium (SPIRIVA) 18 MCG inhalation capsule Place 18 mcg into inhaler and inhale daily.    . valsartan (DIOVAN) 160 MG  tablet Take 160 mg by mouth daily.    . bisacodyl (DULCOLAX) 10 MG suppository Place 1 suppository (10 mg total) rectally daily as needed for moderate constipation. (Patient not taking: Reported on 03/01/2017) 12 suppository 0  . donepezil (ARICEPT) 10 MG tablet     . fenofibrate (TRICOR) 145 MG tablet Take 145 mg by mouth daily.    . Fluticasone Furoate-Vilanterol (BREO ELLIPTA) 100-25 MCG/INH AEPB Inhale 1 puff into the lungs daily.    Marland Kitchen glimepiride (AMARYL) 1 MG tablet     . HYDROcodone-acetaminophen (NORCO) 5-325 MG tablet Take 1 tablet by mouth every 6 (six) hours as needed for moderate pain. (Patient not taking: Reported on 03/01/2017) 30 tablet 0  . meloxicam (MOBIC) 7.5 MG tablet Take 7.5 mg by mouth 2 (two) times daily after a meal.     . ondansetron (ZOFRAN) 4 MG tablet  Take by mouth.    . senna (SENOKOT) 8.6 MG tablet Take 1 tablet by mouth daily.    . verapamil (CALAN-SR) 240 MG CR tablet Take 240 mg by mouth at bedtime.     No current facility-administered medications on file prior to visit.     There are no Patient Instructions on file for this visit. No Follow-up on file.   KIMBERLY A STEGMAYER, PA-C

## 2017-04-17 ENCOUNTER — Other Ambulatory Visit: Payer: Self-pay | Admitting: Internal Medicine

## 2017-04-17 DIAGNOSIS — Z1231 Encounter for screening mammogram for malignant neoplasm of breast: Secondary | ICD-10-CM

## 2017-08-31 ENCOUNTER — Other Ambulatory Visit (INDEPENDENT_AMBULATORY_CARE_PROVIDER_SITE_OTHER): Payer: Self-pay | Admitting: Vascular Surgery

## 2017-08-31 DIAGNOSIS — N185 Chronic kidney disease, stage 5: Secondary | ICD-10-CM

## 2017-09-01 ENCOUNTER — Ambulatory Visit (INDEPENDENT_AMBULATORY_CARE_PROVIDER_SITE_OTHER): Payer: Medicare Other | Admitting: Vascular Surgery

## 2017-09-01 ENCOUNTER — Ambulatory Visit (INDEPENDENT_AMBULATORY_CARE_PROVIDER_SITE_OTHER): Payer: Medicare Other

## 2017-09-01 ENCOUNTER — Encounter (INDEPENDENT_AMBULATORY_CARE_PROVIDER_SITE_OTHER): Payer: Self-pay | Admitting: Vascular Surgery

## 2017-09-01 VITALS — BP 150/60 | HR 53 | Resp 16 | Ht 66.0 in | Wt 184.0 lb

## 2017-09-01 DIAGNOSIS — E785 Hyperlipidemia, unspecified: Secondary | ICD-10-CM | POA: Diagnosis not present

## 2017-09-01 DIAGNOSIS — Z992 Dependence on renal dialysis: Secondary | ICD-10-CM | POA: Diagnosis not present

## 2017-09-01 DIAGNOSIS — E118 Type 2 diabetes mellitus with unspecified complications: Secondary | ICD-10-CM

## 2017-09-01 DIAGNOSIS — N186 End stage renal disease: Secondary | ICD-10-CM

## 2017-09-01 DIAGNOSIS — N185 Chronic kidney disease, stage 5: Secondary | ICD-10-CM

## 2017-09-01 NOTE — Progress Notes (Signed)
Subjective:    Patient ID: Denise [name], female    DOB: Dec 10, 1929, 83 y.o.   MRN: [id number] Chief Complaint  Patient presents with  . Follow-up    6 month HDA   Patient presents for a six-month HDI follow-up.  The patient presents with a complaint today.  Patient states she is not currently on dialysis. The patient underwent a duplex ultrasound of the AV access which was notable for a patent fistula without any significant hemodynamic stenosis. Flow Volume: 2941. The patient also denies any fistula skin breakdown, pain, edema, pallor or ulceration of the arm / hand.  Patient denies any fever, nausea or vomiting.   Review of Systems  Constitutional: Negative.   HENT: Negative.   Eyes: Negative.   Respiratory: Negative.   Cardiovascular: Negative.   Gastrointestinal: Negative.   Endocrine: Negative.   Genitourinary: Negative.   Musculoskeletal: Negative.   Skin: Negative.   Allergic/Immunologic: Negative.   Neurological: Negative.   Hematological: Negative.   Psychiatric/Behavioral: Negative.       Objective:   Physical Exam  Constitutional: She is oriented to person, place, and time. She appears well-developed and well-nourished. No distress.  HENT:  Head: Normocephalic and atraumatic.  Eyes: Conjunctivae are normal. Pupils are equal, round, and reactive to light.  Neck: Normal range of motion.  Cardiovascular: Normal rate, regular rhythm, normal heart sounds and intact distal pulses.  Pulses:      Radial pulses are 2+ on the right side, and 2+ on the left side.  Right upper extremity fistula: Good bruit and thrill  Pulmonary/Chest: Effort normal and breath sounds normal.  Musculoskeletal: She exhibits no edema.  Neurological: She is alert and oriented to person, place, and time.  Skin: Skin is warm and dry. She is not diaphoretic.  Psychiatric: She has a normal mood and affect. Her behavior is normal. Judgment and thought content normal.  Vitals reviewed.  BP (!)  150/60 (BP Location: Left Arm)   Pulse (!) 53   Resp 16   Ht 5\' 6"  (1.676 m)   Wt 184 lb (83.5 kg)   BMI 29.70 kg/m   Past Medical History:  Diagnosis Date  . Arthritis   . Cataract   . Chronic kidney disease   . COPD (chronic obstructive pulmonary disease) ([place])   . Diabetes mellitus without complication ([place])   . Emphysema of lung ([place])   . GERD (gastroesophageal reflux disease)   . Hypertension   . Hypothyroidism    Social History   Socioeconomic History  . Marital status: Widowed    Spouse name: Not on file  . Number of children: Not on file  . Years of education: Not on file  . Highest education level: Not on file  Social Needs  . Financial resource strain: Not on file  . Food insecurity - worry: Not on file  . Food insecurity - inability: Not on file  . Transportation needs - medical: Not on file  . Transportation needs - non-medical: Not on file  Occupational History  . Not on file  Tobacco Use  . Smoking status: Former Smoker    Last attempt to quit: 07/28/2013    Years since quitting: 4.0  . Smokeless tobacco: Never Used  Substance and Sexual Activity  . Alcohol use: No  . Drug use: No  . Sexual activity: No    Birth control/protection: Abstinence  Other Topics Concern  . Not on file  Social History Narrative  . Not on  file   Past Surgical History:  Procedure Laterality Date  . CHOLECYSTECTOMY    . GALLBLADDER SURGERY     Family History  Problem Relation Age of Onset  . Diabetes Mother   . Heart attack Mother   . Stomach cancer Father   . Colon cancer Sister   . Diabetes Sister   . Heart attack Brother   . Colon cancer Brother   . Breast cancer Neg Hx    Allergies  Allergen Reactions  . Iodinated Diagnostic Agents Anaphylaxis, Other (See Comments) and Rash  . Benzocaine-Chloroxylenol-Hc     Years ago  . Benzonatate Rash    Note: pruritis      Assessment & Plan:  Patient presents for a six-month HDI follow-up.  The patient presents  with a complaint today.  Patient states she is not currently on dialysis. The patient underwent a duplex ultrasound of the AV access which was notable for a patent fistula without any significant hemodynamic stenosis. Flow Volume: 2941. The patient also denies any fistula skin breakdown, pain, edema, pallor or ulceration of the arm / hand.  Patient denies any fever, nausea or vomiting.  1. ESRD on dialysis ([place]) - Stable Patient states she is not on dialysis Her fistula does not look like it has been accessed The patient is doing well and currently has adequate dialysis access. Duplex ultrasound of the AV access shows a patent access with no evidence of hemodynamically significant strictures or stenosis.  The patient should continue to have duplex ultrasounds of the dialysis access every six months. The patient was instructed to call the office in the interim if any issues with dialysis access / doppler flow, pain, edema, pallor, fistula skin breakdown or ulceration of the arm / hand occur. The patient expressed their understanding.  - VAS US DUPLEX DIALYSIS ACCESS (AVF,AVG); Future  2. Type 2 diabetes mellitus with complication, unspecified whether long term insulin use (HCC) - Stable Encouraged good control as its slows the progression of atherosclerotic disease  3. Hyperlipidemia, unspecified hyperlipidemia type - Stable Encouraged good control as its slows the progression of atherosclerotic disease  Current Outpatient Medications on File Prior to Visit  Medication Sig Dispense Refill  . atorvastatin (LIPITOR) 40 MG tablet Take 40 mg by mouth at bedtime.     . bisacodyl (DULCOLAX) 10 MG suppository Place 1 suppository (10 mg total) rectally daily as needed for moderate constipation. 12 suppository 0  . Cholecalciferol (VITAMIN D-3 PO) Take 4,000 Units by mouth daily.    . Fluticasone Furoate-Vilanterol (BREO ELLIPTA) 100-25 MCG/INH AEPB Inhale 1 puff into the lungs daily.    . furosemide  (LASIX) 40 MG tablet Take 40 mg by mouth.    . insulin glargine (LANTUS) 100 UNIT/ML injection Inject into the skin.    Elmore Guise Devices (LANCING DEVICE) MISC by Does not apply route.    Marland Kitchen levothyroxine (SYNTHROID, LEVOTHROID) 137 MCG tablet Take 137 mcg by mouth daily.     . meloxicam (MOBIC) 7.5 MG tablet Take 7.5 mg by mouth 2 (two) times daily after a meal.     . METOPROLOL SUCCINATE ER PO Take 1 tablet by mouth daily.    . ondansetron (ZOFRAN) 4 MG tablet Take by mouth.    . pantoprazole (PROTONIX) 40 MG tablet Take 40 mg by mouth daily.    Marland Kitchen tiotropium (SPIRIVA) 18 MCG inhalation capsule Place 18 mcg into inhaler and inhale daily.    . traMADol (ULTRAM) 50 MG tablet Take by  mouth.    . valsartan (DIOVAN) 160 MG tablet Take 160 mg by mouth daily.    . verapamil (CALAN-SR) 240 MG CR tablet Take 240 mg by mouth at bedtime.    . donepezil (ARICEPT) 10 MG tablet     . fenofibrate (TRICOR) 145 MG tablet Take 145 mg by mouth daily.    Marland Kitchen glimepiride (AMARYL) 1 MG tablet     . HYDROcodone-acetaminophen (NORCO) 5-325 MG tablet Take 1 tablet by mouth every 6 (six) hours as needed for moderate pain. (Patient not taking: Reported on 03/01/2017) 30 tablet 0  . senna (SENOKOT) 8.6 MG tablet Take 1 tablet by mouth daily.     No current facility-administered medications on file prior to visit.    There are no Patient Instructions on file for this visit. No Follow-up on file.  KIMBERLY A STEGMAYER, PA-C

## 2018-03-05 ENCOUNTER — Ambulatory Visit (INDEPENDENT_AMBULATORY_CARE_PROVIDER_SITE_OTHER): Payer: Medicare Other | Admitting: Vascular Surgery

## 2018-03-05 ENCOUNTER — Ambulatory Visit (INDEPENDENT_AMBULATORY_CARE_PROVIDER_SITE_OTHER): Payer: Medicare Other

## 2018-03-05 ENCOUNTER — Encounter (INDEPENDENT_AMBULATORY_CARE_PROVIDER_SITE_OTHER): Payer: Self-pay | Admitting: Vascular Surgery

## 2018-03-05 VITALS — BP 169/56 | HR 60 | Resp 16 | Ht 66.0 in | Wt 187.0 lb

## 2018-03-05 DIAGNOSIS — N189 Chronic kidney disease, unspecified: Secondary | ICD-10-CM

## 2018-03-05 DIAGNOSIS — E785 Hyperlipidemia, unspecified: Secondary | ICD-10-CM | POA: Diagnosis not present

## 2018-03-05 DIAGNOSIS — Z992 Dependence on renal dialysis: Secondary | ICD-10-CM

## 2018-03-05 DIAGNOSIS — E118 Type 2 diabetes mellitus with unspecified complications: Secondary | ICD-10-CM | POA: Diagnosis not present

## 2018-03-05 DIAGNOSIS — N186 End stage renal disease: Secondary | ICD-10-CM

## 2018-03-05 NOTE — Progress Notes (Signed)
Subjective:    Patient ID: Denise [name], female    DOB: 11/25/1929, 84 y.o.   MRN: [id number] Chief Complaint  Patient presents with  . Follow-up    30mo HDA   Patient presents for a 77-month HDA follow-up.  The patient has not started dialysis.  The patient presents today without complaint.  The patient denies any right upper extremity pain or ulceration. The patient also denies any fistula skin breakdown, pain, edema, pallor or ulceration of the arm / hand.  Denies any fever, nausea or vomiting.  Review of Systems  Constitutional: Negative.   HENT: Negative.   Eyes: Negative.   Respiratory: Negative.   Cardiovascular: Negative.   Gastrointestinal: Negative.   Endocrine: Negative.   Genitourinary:       Chronic kidney disease  Musculoskeletal: Negative.   Skin: Negative.   Allergic/Immunologic: Negative.   Neurological: Negative.   Hematological: Negative.   Psychiatric/Behavioral: Negative.       Objective:   Physical Exam  Constitutional: She is oriented to person, place, and time. She appears well-developed and well-nourished. No distress.  HENT:  Head: Normocephalic and atraumatic.  Right Ear: External ear normal.  Left Ear: External ear normal.  Eyes: Pupils are equal, round, and reactive to light. Conjunctivae and EOM are normal.  Neck: Normal range of motion.  Cardiovascular: Normal rate, regular rhythm, normal heart sounds and intact distal pulses.  Pulses:      Radial pulses are 2+ on the right side, and 2+ on the left side.  Right upper extremity AV fistula: Mildly aneurysmal.  Good bruit and thrill.  Skin is intact.  Pulmonary/Chest: Effort normal and breath sounds normal.  Musculoskeletal: Normal range of motion. She exhibits no edema.  Neurological: She is alert and oriented to person, place, and time.  Skin: Skin is warm and dry. She is not diaphoretic.  Psychiatric: She has a normal mood and affect. Her behavior is normal. Judgment and thought content  normal.  Vitals reviewed.  BP (!) 169/56 (BP Location: Left Arm)   Pulse 60   Resp 16   Ht 5\' 6"  (1.676 m)   Wt 187 lb (84.8 kg)   BMI 30.18 kg/m   Past Medical History:  Diagnosis Date  . Arthritis   . Cataract   . Chronic kidney disease   . COPD (chronic obstructive pulmonary disease) ([place])   . Diabetes mellitus without complication ([place])   . Emphysema of lung ([place])   . GERD (gastroesophageal reflux disease)   . Hypertension   . Hypothyroidism    Social History   Socioeconomic History  . Marital status: Widowed    Spouse name: Not on file  . Number of children: Not on file  . Years of education: Not on file  . Highest education level: Not on file  Occupational History  . Not on file  Social Needs  . Financial resource strain: Not on file  . Food insecurity:    Worry: Not on file    Inability: Not on file  . Transportation needs:    Medical: Not on file    Non-medical: Not on file  Tobacco Use  . Smoking status: Former Smoker    Last attempt to quit: 07/28/2013    Years since quitting: 4.6  . Smokeless tobacco: Never Used  Substance and Sexual Activity  . Alcohol use: No  . Drug use: No  . Sexual activity: Never    Birth control/protection: Abstinence  Lifestyle  . Physical  activity:    Days per week: Not on file    Minutes per session: Not on file  . Stress: Not on file  Relationships  . Social connections:    Talks on phone: Not on file    Gets together: Not on file    Attends religious service: Not on file    Active member of club or organization: Not on file    Attends meetings of clubs or organizations: Not on file    Relationship status: Not on file  . Intimate partner violence:    Fear of current or ex partner: Not on file    Emotionally abused: Not on file    Physically abused: Not on file    Forced sexual activity: Not on file  Other Topics Concern  . Not on file  Social History Narrative  . Not on file   Past Surgical History:    Procedure Laterality Date  . AV FISTULA PLACEMENT Right 11/18/2015   Procedure: ARTERIOVENOUS (AV) FISTULA CREATION;  Surgeon: Algernon Huxley, MD;  Location: [facility] ORS;  Service: Vascular;  Laterality: Right;  . CHOLECYSTECTOMY    . GALLBLADDER SURGERY    . HIP ARTHROPLASTY Right 07/31/2015   Procedure: ARTHROPLASTY BIPOLAR HIP (HEMIARTHROPLASTY);  Surgeon: Corky Mull, MD;  Location: [facility] ORS;  Service: Orthopedics;  Laterality: Right;   Family History  Problem Relation Age of Onset  . Diabetes Mother   . Heart attack Mother   . Stomach cancer Father   . Colon cancer Sister   . Diabetes Sister   . Heart attack Brother   . Colon cancer Brother   . Breast cancer Neg Hx    Allergies  Allergen Reactions  . Iodinated Diagnostic Agents Anaphylaxis, Other (See Comments) and Rash  . Benzocaine-Chloroxylenol-Hc     Years ago  . Benzonatate Rash    Note: pruritis      Assessment & Plan:  Patient presents for a 7-month HDA follow-up.  The patient has not started dialysis.  The patient presents today without complaint.  The patient denies any right upper extremity pain or ulceration. The patient also denies any fistula skin breakdown, pain, edema, pallor or ulceration of the arm / hand.  Denies any fever, nausea or vomiting.  1. Chronic kidney disease, unspecified CKD stage - Stable Patient presents for 108-month HDA follow-up The patient is currently not on dialysis at this time. Patient with a patent right cephalic AV fistula which was created on 11/18/2015 Patient is to continue undergoing HDA's every 69-month to assure her fistula is functioning appropriately  - VAS US DUPLEX DIALYSIS ACCESS (AVF,AVG); Future  2. Hyperlipidemia, unspecified hyperlipidemia type - Stable Encouraged good control as its slows the progression of atherosclerotic disease  3. Type 2 diabetes mellitus with complication, unspecified whether long term insulin use (HCC) - Stable Encouraged good control as its slows  the progression of atherosclerotic disease  Current Outpatient Medications on File Prior to Visit  Medication Sig Dispense Refill  . atorvastatin (LIPITOR) 40 MG tablet Take 40 mg by mouth at bedtime.     . bisacodyl (DULCOLAX) 10 MG suppository Place 1 suppository (10 mg total) rectally daily as needed for moderate constipation. 12 suppository 0  . Cholecalciferol (VITAMIN D-3 PO) Take 4,000 Units by mouth daily.    Marland Kitchen donepezil (ARICEPT) 10 MG tablet     . fenofibrate (TRICOR) 145 MG tablet Take 145 mg by mouth daily.    . Fluticasone Furoate-Vilanterol (BREO ELLIPTA) 100-25 MCG/INH  AEPB Inhale 1 puff into the lungs daily.    . furosemide (LASIX) 40 MG tablet Take 40 mg by mouth.    Marland Kitchen glimepiride (AMARYL) 1 MG tablet     . insulin glargine (LANTUS) 100 UNIT/ML injection Inject into the skin.    Elmore Guise Devices (LANCING DEVICE) MISC by Does not apply route.    Marland Kitchen levothyroxine (SYNTHROID, LEVOTHROID) 137 MCG tablet Take 137 mcg by mouth daily.     . meloxicam (MOBIC) 7.5 MG tablet Take 7.5 mg by mouth 2 (two) times daily after a meal.     . METOPROLOL SUCCINATE ER PO Take 1 tablet by mouth daily.    . ondansetron (ZOFRAN) 4 MG tablet Take by mouth.    . pantoprazole (PROTONIX) 40 MG tablet Take 40 mg by mouth daily.    Marland Kitchen senna (SENOKOT) 8.6 MG tablet Take 1 tablet by mouth daily.    Marland Kitchen tiotropium (SPIRIVA) 18 MCG inhalation capsule Place 18 mcg into inhaler and inhale daily.    . traMADol (ULTRAM) 50 MG tablet Take by mouth.    . valsartan (DIOVAN) 160 MG tablet Take 160 mg by mouth daily.    . verapamil (CALAN-SR) 240 MG CR tablet Take 240 mg by mouth at bedtime.    Marland Kitchen HYDROcodone-acetaminophen (NORCO) 5-325 MG tablet Take 1 tablet by mouth every 6 (six) hours as needed for moderate pain. (Patient not taking: Reported on 03/01/2017) 30 tablet 0   No current facility-administered medications on file prior to visit.    There are no Patient Instructions on file for this visit. No follow-ups on  file.  KIMBERLY A STEGMAYER, PA-C

## 2018-09-14 ENCOUNTER — Encounter (INDEPENDENT_AMBULATORY_CARE_PROVIDER_SITE_OTHER): Payer: Medicare Other

## 2018-09-14 ENCOUNTER — Ambulatory Visit (INDEPENDENT_AMBULATORY_CARE_PROVIDER_SITE_OTHER): Payer: Medicare Other | Admitting: Vascular Surgery

## 2019-11-06 ENCOUNTER — Other Ambulatory Visit: Payer: Self-pay

## 2019-11-06 ENCOUNTER — Emergency Department
Admission: EM | Admit: 2019-11-06 | Discharge: 2019-11-06 | Disposition: A | Discharge status: Home / Self Care | Payer: Medicare Other | Attending: Emergency Medicine | Admitting: Emergency Medicine

## 2019-11-06 ENCOUNTER — Emergency Department: Payer: Medicare Other

## 2019-11-06 ENCOUNTER — Encounter: Payer: Self-pay | Admitting: Emergency Medicine

## 2019-11-06 DIAGNOSIS — Z79899 Other long term (current) drug therapy: Secondary | ICD-10-CM | POA: Diagnosis not present

## 2019-11-06 DIAGNOSIS — R002 Palpitations: Secondary | ICD-10-CM

## 2019-11-06 DIAGNOSIS — R42 Dizziness and giddiness: Secondary | ICD-10-CM | POA: Diagnosis present

## 2019-11-06 DIAGNOSIS — Z794 Long term (current) use of insulin: Secondary | ICD-10-CM | POA: Diagnosis not present

## 2019-11-06 DIAGNOSIS — Z87891 Personal history of nicotine dependence: Secondary | ICD-10-CM | POA: Diagnosis not present

## 2019-11-06 DIAGNOSIS — E1122 Type 2 diabetes mellitus with diabetic chronic kidney disease: Secondary | ICD-10-CM | POA: Insufficient documentation

## 2019-11-06 DIAGNOSIS — I120 Hypertensive chronic kidney disease with stage 5 chronic kidney disease or end stage renal disease: Secondary | ICD-10-CM | POA: Insufficient documentation

## 2019-11-06 DIAGNOSIS — Z992 Dependence on renal dialysis: Secondary | ICD-10-CM | POA: Insufficient documentation

## 2019-11-06 DIAGNOSIS — N186 End stage renal disease: Secondary | ICD-10-CM | POA: Insufficient documentation

## 2019-11-06 LAB — CBC WITH DIFFERENTIAL/PLATELET
Abs Immature Granulocytes: 0.02 10*3/uL (ref 0.00–0.07)
Basophils Absolute: 0.1 10*3/uL (ref 0.0–0.1)
Basophils Relative: 1 %
Eosinophils Absolute: 0.2 10*3/uL (ref 0.0–0.5)
Eosinophils Relative: 3 %
HCT: 35.8 % — ABNORMAL LOW (ref 36.0–46.0)
Hemoglobin: 11.1 g/dL — ABNORMAL LOW (ref 12.0–15.0)
Immature Granulocytes: 0 %
Lymphocytes Relative: 35 %
Lymphs Abs: 2.4 10*3/uL (ref 0.7–4.0)
MCH: 28.4 pg (ref 26.0–34.0)
MCHC: 31 g/dL (ref 30.0–36.0)
MCV: 91.6 fL (ref 80.0–100.0)
Monocytes Absolute: 0.6 10*3/uL (ref 0.1–1.0)
Monocytes Relative: 9 %
Neutro Abs: 3.6 10*3/uL (ref 1.7–7.7)
Neutrophils Relative %: 52 %
Platelets: 187 10*3/uL (ref 150–400)
RBC: 3.91 MIL/uL (ref 3.87–5.11)
RDW: 14.3 % (ref 11.5–15.5)
WBC: 6.9 10*3/uL (ref 4.0–10.5)
nRBC: 0 % (ref 0.0–0.2)

## 2019-11-06 LAB — COMPREHENSIVE METABOLIC PANEL
ALT: 10 U/L (ref 0–44)
AST: 18 U/L (ref 15–41)
Albumin: 3.6 g/dL (ref 3.5–5.0)
Alkaline Phosphatase: 149 U/L — ABNORMAL HIGH (ref 38–126)
Anion gap: 8 (ref 5–15)
BUN: 43 mg/dL — ABNORMAL HIGH (ref 8–23)
CO2: 28 mmol/L (ref 22–32)
Calcium: 9.2 mg/dL (ref 8.9–10.3)
Chloride: 110 mmol/L (ref 98–111)
Creatinine, Ser: 2.16 mg/dL — ABNORMAL HIGH (ref 0.44–1.00)
GFR calc Af Amer: 23 mL/min — ABNORMAL LOW (ref >60)
GFR calc non Af Amer: 20 mL/min — ABNORMAL LOW (ref >60)
Glucose, Bld: 128 mg/dL — ABNORMAL HIGH (ref 70–99)
Potassium: 4.6 mmol/L (ref 3.5–5.1)
Sodium: 146 mmol/L — ABNORMAL HIGH (ref 135–145)
Total Bilirubin: 0.7 mg/dL (ref 0.3–1.2)
Total Protein: 6.8 g/dL (ref 6.5–8.1)

## 2019-11-06 LAB — URINALYSIS, COMPLETE (UACMP) WITH MICROSCOPIC
Bacteria, UA: NONE SEEN
Bilirubin Urine: NEGATIVE
Glucose, UA: NEGATIVE mg/dL
Hgb urine dipstick: NEGATIVE
Ketones, ur: NEGATIVE mg/dL
Leukocytes,Ua: NEGATIVE
Nitrite: NEGATIVE
Protein, ur: 30 mg/dL — AB
Specific Gravity, Urine: 1.015 (ref 1.005–1.030)
pH: 5 (ref 5.0–8.0)

## 2019-11-06 LAB — TROPONIN I (HIGH SENSITIVITY)
Troponin I (High Sensitivity): 12 ng/L (ref <18)
Troponin I (High Sensitivity): 11 ng/L (ref <18)

## 2019-11-06 MED ORDER — MECLIZINE HCL 25 MG PO TABS
25.0000 mg | ORAL_TABLET | Freq: Once | ORAL | Status: AC
  Administered 2019-11-06: 25 mg via ORAL
  Filled 2019-11-06: qty 1

## 2019-11-06 MED ORDER — SODIUM CHLORIDE 0.9 % IV BOLUS
500.0000 mL | Freq: Once | INTRAVENOUS | Status: AC
  Administered 2019-11-06: 13:00:00 500 mL via INTRAVENOUS

## 2019-11-06 NOTE — ED Triage Notes (Signed)
Pt to triage via w/c, mask in place with no distress noted; EMS brought pt from home with c/o CP earlier in the evening, none at present, now with dizziness

## 2019-11-06 NOTE — Discharge Instructions (Signed)
Make sure to drink fluids regularly, eat small amounts regularly throughout the day, and take all your medications as prescribed.  Return to the ER immediately for new, worsening, or persistent palpitations, chest pain, dizziness, weakness, fever, or any other new or worsening symptoms that concern you.

## 2019-11-06 NOTE — Progress Notes (Signed)
   11/06/19 1400  Clinical Encounter Type  Visited With Patient;Health care provider  Visit Type Initial  Referral From Other (Comment)  Consult/Referral To  (Patient Access)  Stress Factors  Patient Stress Factors Health changes   Chaplain received a referral from Patient Access to follow up with the patient re: extended wait and bed placement. Chaplains Mariea Clonts and [place] completed a joint visit with the patient. Upon arrival, the patient was sitting up in the hospital bed. She was eating a lunch and reported that she had not been able to eat since last night. The patient reported that there had been a lot going on and that she hoped her hospital visit would enable her to get to the bottom of her symptoms. She is the youngest and only surviving sibling of seven (95). She also shared other major losses in her life including the death of her son and grandson. The patient lamented many important losses and acknowledged that she gains strength from her faith and family during hard times. The patient expressed the importance of giving and the joy she gets from giving gifts to her community each year. Chaplains Mariea Clonts and [place] were given permission to phone the patient's daughter to give her and update on the patient (her mother's status).

## 2019-11-06 NOTE — ED Notes (Signed)
Pt given sandwich tray and water at this time per MD request

## 2019-11-06 NOTE — ED Notes (Signed)
Unable to collect second troponin. Pt stuck x2.

## 2019-11-06 NOTE — ED Provider Notes (Signed)
[facility] Emergency Department Provider Note ____________________________________________   First MD Initiated Contact with Patient 11/06/19 1259     (approximate)  I have reviewed the triage vital signs and the nursing notes.   HISTORY  Chief Complaint Dizziness    HPI Denise [name] is a 86 y.o. female with PMH as noted below who presents with dizziness which she describes both as lightheadedness as well as a spinning sensation, occurring intermittently.  The patient states that she has had similar sensations for some time, at least a few months, but that it has worsened over the last few days.  In addition the patient feels somewhat shaky.  She has had a few episodes in which she feels like her heart is going fast and she has a pulsating sensation in her back.  She also reports some associated nausea when she gets the dizzy spells.  She states it is worse with changes in position.  She denies headache, vision changes, vomiting or diarrhea, fever, shortness of breath, or urinary symptoms.  Past Medical History:  Diagnosis Date  . Arthritis   . Cataract   . Chronic kidney disease   . COPD (chronic obstructive pulmonary disease) ([place])   . Diabetes mellitus without complication ([place])   . Emphysema of lung ([place])   . GERD (gastroesophageal reflux disease)   . Hypertension   . Hypothyroidism     Patient Active Problem List   Diagnosis Date Noted  . ESRD on dialysis ([place]) 04/11/2017  . Hyperlipidemia 01/05/2017  . Diabetes mellitus type 2 with complications ([place]) [id number]  . Chronic kidney disease 01/05/2017  . Intertrochanteric fracture of right hip ([place]) 08/03/2015  . Hypoglycemia secondary to sulfonylurea 07/29/2015    Past Surgical History:  Procedure Laterality Date  . AV FISTULA PLACEMENT Right 11/18/2015   Procedure: ARTERIOVENOUS (AV) FISTULA CREATION;  Surgeon: Algernon Huxley, MD;  Location: [facility] ORS;  Service: Vascular;  Laterality: Right;    . CHOLECYSTECTOMY    . GALLBLADDER SURGERY    . HIP ARTHROPLASTY Right 07/31/2015   Procedure: ARTHROPLASTY BIPOLAR HIP (HEMIARTHROPLASTY);  Surgeon: Corky Mull, MD;  Location: [facility] ORS;  Service: Orthopedics;  Laterality: Right;    Prior to Admission medications   Medication Sig Start Date End Date Taking? Authorizing Provider  atorvastatin (LIPITOR) 40 MG tablet Take 40 mg by mouth at bedtime.     [provider]  bisacodyl (DULCOLAX) 10 MG suppository Place 1 suppository (10 mg total) rectally daily as needed for moderate constipation. 08/03/15   Aldean Jewett, MD  Cholecalciferol (VITAMIN D-3 PO) Take 4,000 Units by mouth daily.    [provider]  donepezil (ARICEPT) 10 MG tablet  01/26/15   [provider]  fenofibrate (TRICOR) 145 MG tablet Take 145 mg by mouth daily.    [provider]  Fluticasone Furoate-Vilanterol (BREO ELLIPTA) 100-25 MCG/INH AEPB Inhale 1 puff into the lungs daily.    [provider]  furosemide (LASIX) 40 MG tablet Take 40 mg by mouth.    [provider]  glimepiride (AMARYL) 1 MG tablet  09/08/14   [provider]  HYDROcodone-acetaminophen (NORCO) 5-325 MG tablet Take 1 tablet by mouth every 6 (six) hours as needed for moderate pain. Patient not taking: Reported on 03/01/2017 11/18/15   Algernon Huxley, MD  insulin glargine (LANTUS) 100 UNIT/ML injection Inject into the skin.    [provider]  Lancet Devices (LANCING DEVICE) MISC by Does not apply  route.    [provider]  levothyroxine (SYNTHROID, LEVOTHROID) 137 MCG tablet Take 137 mcg by mouth daily.     [provider]  meloxicam (MOBIC) 7.5 MG tablet Take 7.5 mg by mouth 2 (two) times daily after a meal.     [provider]  METOPROLOL SUCCINATE ER PO Take 1 tablet by mouth daily.    [provider]  ondansetron (ZOFRAN) 4 MG tablet Take by mouth.    [provider]  pantoprazole  (PROTONIX) 40 MG tablet Take 40 mg by mouth daily.    [provider]  senna (SENOKOT) 8.6 MG tablet Take 1 tablet by mouth daily.    [provider]  tiotropium (SPIRIVA) 18 MCG inhalation capsule Place 18 mcg into inhaler and inhale daily.    [provider]  traMADol (ULTRAM) 50 MG tablet Take by mouth. 06/29/17   [provider]  valsartan (DIOVAN) 160 MG tablet Take 160 mg by mouth daily.    [provider]  verapamil (CALAN-SR) 240 MG CR tablet Take 240 mg by mouth at bedtime.    [provider]    Allergies Iodinated diagnostic agents, Benzocaine-chloroxylenol-hc, and Benzonatate  Family History  Problem Relation Age of Onset  . Diabetes Mother   . Heart attack Mother   . Stomach cancer Father   . Colon cancer Sister   . Diabetes Sister   . Heart attack Brother   . Colon cancer Brother   . Breast cancer Neg Hx     Social History Social History   Tobacco Use  . Smoking status: Former Smoker    Quit date: 07/28/2013    Years since quitting: 6.2  . Smokeless tobacco: Never Used  Substance Use Topics  . Alcohol use: No  . Drug use: No    Review of Systems  Constitutional: No fever. Eyes: No visual changes. ENT: No sore throat. Cardiovascular: Positive for resolved chest discomfort. Respiratory: Denies shortness of breath. Gastrointestinal: Positive for nausea.  No vomiting or diarrhea.  Genitourinary: Negative for dysuria.  Musculoskeletal: Negative for back pain. Skin: Negative for rash. Neurological: Negative for headaches, focal weakness or numbness.   ____________________________________________   PHYSICAL EXAM:  VITAL SIGNS: ED Triage Vitals  Enc Vitals Group     BP 11/06/19 0500 (!) 134/100     Pulse Rate 11/06/19 0500 62     Resp 11/06/19 0500 19     Temp 11/06/19 0500 97.7 F (36.5 C)     Temp Source 11/06/19 0500 Oral     SpO2 11/06/19 0500 99 %     Weight 11/06/19 0501 170 lb (77.1 kg)      Height 11/06/19 0501 5\' 6"  (1.676 m)     Head Circumference --      Peak Flow --      Pain Score 11/06/19 0501 0     Pain Loc --      Pain Edu? --      Excl. in [place]? --     Constitutional: Alert and oriented. Well appearing for age and in no acute distress. Eyes: Conjunctivae are normal.  EOMI.  PERRLA. Head: Atraumatic. Nose: No congestion/rhinnorhea. Mouth/Throat: Mucous membranes are moist.   Neck: Normal range of motion.  Cardiovascular: Normal rate, regular rhythm.  Good peripheral circulation. Respiratory: Normal respiratory effort.  No retractions. Gastrointestinal: Soft and nontender. No distention.  Genitourinary: No flank tenderness. Musculoskeletal: No lower extremity edema.  Extremities warm and well perfused.  Neurologic:  Normal speech and language.  Mild tremor.  5/5 motor strength and intact sensation to all extremities.  Normal coordination with no ataxia on finger-to-nose.  No pronator drift.   Skin:  Skin is warm and dry. No rash noted. Psychiatric: Mood and affect are normal. Speech and behavior are normal.  ____________________________________________   LABS (all labs ordered are listed, but only abnormal results are displayed)  Labs Reviewed  CBC WITH DIFFERENTIAL/PLATELET - Abnormal; Notable for the following components:      Result Value   Hemoglobin 11.1 (*)    HCT 35.8 (*)    All other components within normal limits  COMPREHENSIVE METABOLIC PANEL - Abnormal; Notable for the following components:   Sodium 146 (*)    Glucose, Bld 128 (*)    BUN 43 (*)    Creatinine, Ser 2.16 (*)    Alkaline Phosphatase 149 (*)    GFR calc non Af Amer 20 (*)    GFR calc Af Amer 23 (*)    All other components within normal limits  URINALYSIS, COMPLETE (UACMP) WITH MICROSCOPIC - Abnormal; Notable for the following components:   Color, Urine YELLOW (*)    APPearance CLEAR (*)    Protein, ur 30 (*)    All other components within normal limits  TROPONIN I (HIGH  SENSITIVITY)  TROPONIN I (HIGH SENSITIVITY)   ____________________________________________  EKG  ED ECG REPORT I, Arta Silence, the attending physician, personally viewed and interpreted this ECG.  Date: 11/06/2019 EKG Time: 0508 Rate: 64 Rhythm: normal sinus rhythm QRS Axis: normal Intervals: normal ST/T Wave abnormalities: Nonspecific anterior T wave abnormality Narrative Interpretation: Nonspecific abnormalities with no evidence of acute ischemia; no significant change when compared to EKG of 07/29/2015  ____________________________________________  RADIOLOGY  CT head: No ICH or other acute abnormality CXR: No focal infiltrate or other acute abnormality  ____________________________________________   PROCEDURES  Procedure(s) performed: No  Procedures  Critical Care performed: No ____________________________________________   INITIAL IMPRESSION / ASSESSMENT AND PLAN / ED COURSE  Pertinent labs & imaging results that were available during my care of the patient were reviewed by me and considered in my medical decision making (see chart for details).  86 year old female with PMH as noted above presents with nonspecific dizziness over the last several days which she describes both as lightheadedness but also as a spinning sensation which is exacerbated with movement and associated with nausea.  She has had this intermittently in the past but it is slightly worsened.  In addition she reports palpitations and some generalized weakness.  I reviewed the past medical records in [place].  The patient was most recently admitted to the hospital for hypoglycemia secondary to sulfonylurea and a hip fracture in 2016, and has had no ED visits or admissions in the last year.  On exam, the patient is very well-appearing for her age.  Her vital signs are normal.  The physical exam is unremarkable except for a mild diffuse tremor which the patient states has somewhat worsened  recently.  Neurologic exam is nonfocal.  The patient was brought back to room after waiting 8 hours, and most of her work-up had been completed prior to my evaluating her.  She had a CT head, chest x-ray, and basic labs all of which are within normal limits except for mild hyponatremia and an elevated creatinine which is actually improved from prior labs and likely baseline for the patient.  EKG shows no acute findings.  Given the negative work-up, we have  ruled out ACS or other cardiac etiology, electrolyte abnormality, and acute CNS cause.  Presentation is most consistent with peripheral vertigo, versus mild dehydration or other relatively benign etiology.  The patient states that she is feeling somewhat better at this time.  We will give an IV fluid bolus, feed her a meal, give a dose of meclizine, and reassess.  ----------------------------------------- 3:05 PM on 11/06/2019 -----------------------------------------  The patient states that she is feeling better and she appears well.  Her urine shows some WBCs, but no nitrites or other specific findings to suggest UTI, and her serum WBC count is normal.  Clinically there is no evidence of urinary tract infection at this time.  The patient feels comfortable going home.  I counseled her on return precautions and follow-up with her doctor.  She demonstrates understanding and agrees with plan.  ____________________________________________   FINAL CLINICAL IMPRESSION(S) / ED DIAGNOSES  Final diagnoses:  Dizziness  Palpitations      NEW MEDICATIONS STARTED DURING THIS VISIT:  New Prescriptions   No medications on file     Note:  This document was prepared using Dragon voice recognition software and may include unintentional dictation errors.    Arta Silence, MD 11/06/19 1506

## 2019-11-21 ENCOUNTER — Emergency Department: Payer: Medicare Other

## 2019-11-21 ENCOUNTER — Other Ambulatory Visit: Payer: Self-pay

## 2019-11-21 ENCOUNTER — Emergency Department
Admission: EM | Admit: 2019-11-21 | Discharge: 2019-11-21 | Disposition: A | Discharge status: Home / Self Care | Payer: Medicare Other | Attending: Emergency Medicine | Admitting: Emergency Medicine

## 2019-11-21 ENCOUNTER — Encounter: Payer: Self-pay | Admitting: Emergency Medicine

## 2019-11-21 DIAGNOSIS — Z96641 Presence of right artificial hip joint: Secondary | ICD-10-CM | POA: Diagnosis not present

## 2019-11-21 DIAGNOSIS — J449 Chronic obstructive pulmonary disease, unspecified: Secondary | ICD-10-CM | POA: Insufficient documentation

## 2019-11-21 DIAGNOSIS — E1122 Type 2 diabetes mellitus with diabetic chronic kidney disease: Secondary | ICD-10-CM | POA: Diagnosis not present

## 2019-11-21 DIAGNOSIS — Z87891 Personal history of nicotine dependence: Secondary | ICD-10-CM | POA: Diagnosis not present

## 2019-11-21 DIAGNOSIS — I120 Hypertensive chronic kidney disease with stage 5 chronic kidney disease or end stage renal disease: Secondary | ICD-10-CM | POA: Diagnosis not present

## 2019-11-21 DIAGNOSIS — Z992 Dependence on renal dialysis: Secondary | ICD-10-CM | POA: Insufficient documentation

## 2019-11-21 DIAGNOSIS — R270 Ataxia, unspecified: Secondary | ICD-10-CM | POA: Diagnosis not present

## 2019-11-21 DIAGNOSIS — N186 End stage renal disease: Secondary | ICD-10-CM | POA: Diagnosis not present

## 2019-11-21 DIAGNOSIS — E039 Hypothyroidism, unspecified: Secondary | ICD-10-CM | POA: Diagnosis not present

## 2019-11-21 DIAGNOSIS — R42 Dizziness and giddiness: Secondary | ICD-10-CM | POA: Diagnosis present

## 2019-11-21 HISTORY — DX: Unspecified atrial fibrillation: I48.91

## 2019-11-21 LAB — COMPREHENSIVE METABOLIC PANEL
ALT: <5 U/L (ref 0–44)
AST: 24 U/L (ref 15–41)
Albumin: 3.7 g/dL (ref 3.5–5.0)
Alkaline Phosphatase: 149 U/L — ABNORMAL HIGH (ref 38–126)
Anion gap: 8 (ref 5–15)
BUN: 35 mg/dL — ABNORMAL HIGH (ref 8–23)
CO2: 26 mmol/L (ref 22–32)
Calcium: 9.2 mg/dL (ref 8.9–10.3)
Chloride: 110 mmol/L (ref 98–111)
Creatinine, Ser: 2.24 mg/dL — ABNORMAL HIGH (ref 0.44–1.00)
GFR calc Af Amer: 22 mL/min — ABNORMAL LOW (ref >60)
GFR calc non Af Amer: 19 mL/min — ABNORMAL LOW (ref >60)
Glucose, Bld: 69 mg/dL — ABNORMAL LOW (ref 70–99)
Potassium: 4.9 mmol/L (ref 3.5–5.1)
Sodium: 144 mmol/L (ref 135–145)
Total Bilirubin: 1.2 mg/dL (ref 0.3–1.2)
Total Protein: 7 g/dL (ref 6.5–8.1)

## 2019-11-21 LAB — CBC WITH DIFFERENTIAL/PLATELET
Abs Immature Granulocytes: 0.02 10*3/uL (ref 0.00–0.07)
Basophils Absolute: 0.1 10*3/uL (ref 0.0–0.1)
Basophils Relative: 1 %
Eosinophils Absolute: 0.1 10*3/uL (ref 0.0–0.5)
Eosinophils Relative: 2 %
HCT: 36.5 % (ref 36.0–46.0)
Hemoglobin: 11.2 g/dL — ABNORMAL LOW (ref 12.0–15.0)
Immature Granulocytes: 0 %
Lymphocytes Relative: 37 %
Lymphs Abs: 2.6 10*3/uL (ref 0.7–4.0)
MCH: 28 pg (ref 26.0–34.0)
MCHC: 30.7 g/dL (ref 30.0–36.0)
MCV: 91.3 fL (ref 80.0–100.0)
Monocytes Absolute: 0.7 10*3/uL (ref 0.1–1.0)
Monocytes Relative: 10 %
Neutro Abs: 3.4 10*3/uL (ref 1.7–7.7)
Neutrophils Relative %: 50 %
Platelets: 175 10*3/uL (ref 150–400)
RBC: 4 MIL/uL (ref 3.87–5.11)
RDW: 14.6 % (ref 11.5–15.5)
WBC: 6.9 10*3/uL (ref 4.0–10.5)
nRBC: 0 % (ref 0.0–0.2)

## 2019-11-21 LAB — URINALYSIS, COMPLETE (UACMP) WITH MICROSCOPIC
Bacteria, UA: NONE SEEN
Bilirubin Urine: NEGATIVE
Glucose, UA: NEGATIVE mg/dL
Hgb urine dipstick: NEGATIVE
Ketones, ur: NEGATIVE mg/dL
Leukocytes,Ua: NEGATIVE
Nitrite: NEGATIVE
Protein, ur: NEGATIVE mg/dL
Specific Gravity, Urine: 1.011 (ref 1.005–1.030)
pH: 5 (ref 5.0–8.0)

## 2019-11-21 LAB — GLUCOSE, CAPILLARY
Glucose-Capillary: 68 mg/dL — ABNORMAL LOW (ref 70–99)
Glucose-Capillary: 69 mg/dL — ABNORMAL LOW (ref 70–99)
Glucose-Capillary: 88 mg/dL (ref 70–99)

## 2019-11-21 LAB — TROPONIN I (HIGH SENSITIVITY): Troponin I (High Sensitivity): 11 ng/L (ref <18)

## 2019-11-21 MED ORDER — DIAZEPAM 2 MG PO TABS
2.0000 mg | ORAL_TABLET | Freq: Once | ORAL | Status: AC
  Administered 2019-11-21: 2 mg via ORAL
  Filled 2019-11-21: qty 1

## 2019-11-21 MED ORDER — PREDNISONE 10 MG (21) PO TBPK: ORAL_TABLET | ORAL | 0 refills | Status: DC

## 2019-11-21 MED ORDER — METHYLPREDNISOLONE SODIUM SUCC 125 MG IJ SOLR
125.0000 mg | Freq: Once | INTRAMUSCULAR | Status: AC
  Administered 2019-11-21: 125 mg via INTRAVENOUS
  Filled 2019-11-21: qty 2

## 2019-11-21 MED ORDER — MECLIZINE HCL 25 MG PO TABS
50.0000 mg | ORAL_TABLET | Freq: Once | ORAL | Status: AC
  Administered 2019-11-21: 50 mg via ORAL
  Filled 2019-11-21: qty 2

## 2019-11-21 MED ORDER — METOPROLOL TARTRATE 5 MG/5ML IV SOLN
5.0000 mg | Freq: Once | INTRAVENOUS | Status: AC
  Administered 2019-11-21: 5 mg via INTRAVENOUS
  Filled 2019-11-21: qty 5

## 2019-11-21 MED ORDER — SODIUM CHLORIDE 0.9 % IV BOLUS
500.0000 mL | Freq: Once | INTRAVENOUS | Status: AC
  Administered 2019-11-21: 500 mL via INTRAVENOUS

## 2019-11-21 NOTE — ED Triage Notes (Addendum)
ARrives from home c/o dizziness x 3-4 days.  Has history of Afib, EMS reports several episodes of afib with pauses.  ALso recently diagnosed with inner ear problems, being followed by ENT

## 2019-11-21 NOTE — ED Provider Notes (Signed)
[facility] Emergency Department Provider Note       Time seen: ----------------------------------------- 10:10 AM on 11/21/2019 -----------------------------------------   I have reviewed the triage vital signs and the nursing notes.  HISTORY   Chief Complaint Dizziness    HPI Denise [name] is a 86 y.o. female with a history of atrial fibrillation, arthritis, chronic kidney disease, COPD, diabetes, GERD, hypertension, hypothyroidism who presents to the ED for dizziness for the past 3 to 4 days.  In route she was noted to have several episodes of rapid A. fib.  She was recently seen for dizziness and diagnosed with inner ear problems, has not had any improvement.  Past Medical History:  Diagnosis Date  . A-fib ([place])   . Arthritis   . Cataract   . Chronic kidney disease   . COPD (chronic obstructive pulmonary disease) ([place])   . Diabetes mellitus without complication ([place])   . Emphysema of lung ([place])   . GERD (gastroesophageal reflux disease)   . Hypertension   . Hypothyroidism     Patient Active Problem List   Diagnosis Date Noted  . ESRD on dialysis ([place]) 04/11/2017  . Hyperlipidemia 01/05/2017  . Diabetes mellitus type 2 with complications ([place]) [id number]  . Chronic kidney disease 01/05/2017  . Intertrochanteric fracture of right hip ([place]) 08/03/2015  . Hypoglycemia secondary to sulfonylurea 07/29/2015    Past Surgical History:  Procedure Laterality Date  . AV FISTULA PLACEMENT Right 11/18/2015   Procedure: ARTERIOVENOUS (AV) FISTULA CREATION;  Surgeon: Algernon Huxley, MD;  Location: [facility] ORS;  Service: Vascular;  Laterality: Right;  . CHOLECYSTECTOMY    . GALLBLADDER SURGERY    . HIP ARTHROPLASTY Right 07/31/2015   Procedure: ARTHROPLASTY BIPOLAR HIP (HEMIARTHROPLASTY);  Surgeon: Corky Mull, MD;  Location: [facility] ORS;  Service: Orthopedics;  Laterality: Right;    Allergies Iodinated diagnostic agents, Benzocaine-chloroxylenol-hc, and  Benzonatate  Social History Social History   Tobacco Use  . Smoking status: Former Smoker    Quit date: 07/28/2013    Years since quitting: 6.3  . Smokeless tobacco: Never Used  Substance Use Topics  . Alcohol use: No  . Drug use: No   Review of Systems Constitutional: Negative for fever. Cardiovascular: Negative for chest pain. Respiratory: Negative for shortness of breath. Gastrointestinal: Negative for abdominal pain, vomiting and diarrhea. Musculoskeletal: Negative for back pain. Skin: Negative for rash. Neurological: Negative for headaches, focal weakness or numbness.  Positive for dizziness and headache  All systems negative/normal/unremarkable except as stated in the HPI  ____________________________________________   PHYSICAL EXAM:  VITAL SIGNS: ED Triage Vitals [11/21/19 1003]  Enc Vitals Group     BP      Pulse      Resp      Temp      Temp src      SpO2      Weight 169 lb 15.6 oz (77.1 kg)     Height 5\' 6"  (1.676 m)     Head Circumference      Peak Flow      Pain Score      Pain Loc      Pain Edu?      Excl. in [place]?     Constitutional: Alert and oriented.  Anxious, mild distress Eyes: Conjunctivae are normal. Normal extraocular movements. ENT      Head: Normocephalic and atraumatic.      Nose: No congestion/rhinnorhea.      Mouth/Throat: Mucous membranes are moist.  Neck: No stridor. Cardiovascular: Normal rate, regular rhythm. No murmurs, rubs, or gallops. Respiratory: Normal respiratory effort without tachypnea nor retractions. Breath sounds are clear and equal bilaterally. No wheezes/rales/rhonchi. Gastrointestinal: Soft and nontender. Normal bowel sounds Musculoskeletal: Nontender with normal range of motion in extremities. No lower extremity tenderness nor edema. Neurologic:  Normal speech and language. No gross focal neurologic deficits are appreciated.  Skin:  Skin is warm, dry and intact. No rash noted. Psychiatric: Anxious mood and  affect ____________________________________________  EKG: Interpreted by me.  Atrial fibrillation with a rate of 120 bpm, repolarization abnormality, normal axis, prolonged QT  ____________________________________________  ED COURSE:  As part of my medical decision making, I reviewed the following data within the [place] History obtained from family if available, nursing notes, old chart and ekg, as well as notes from prior ED visits. Patient presented for atrial fibrillation with rapid ventricular response, rate is 120 bpm, we will assess with labs and imaging as indicated at this time.   Procedures  [name] was evaluated in Emergency Department on 11/21/2019 for the symptoms described in the history of present illness. She was evaluated in the context of the global COVID-19 pandemic, which necessitated consideration that the patient might be at risk for infection with the SARS-CoV-2 virus that causes COVID-19. Institutional protocols and algorithms that pertain to the evaluation of patients at risk for COVID-19 are in a state of rapid change based on information released by regulatory bodies including the CDC and federal and state organizations. These policies and algorithms were followed during the patient's care in the ED.  ____________________________________________   LABS (pertinent positives/negatives)  Labs Reviewed  CBC WITH DIFFERENTIAL/PLATELET - Abnormal; Notable for the following components:      Result Value   Hemoglobin 11.2 (*)    All other components within normal limits  COMPREHENSIVE METABOLIC PANEL - Abnormal; Notable for the following components:   Glucose, Bld 69 (*)    BUN 35 (*)    Creatinine, Ser 2.24 (*)    Alkaline Phosphatase 149 (*)    GFR calc non Af Amer 19 (*)    GFR calc Af Amer 22 (*)    All other components within normal limits  URINALYSIS, COMPLETE (UACMP) WITH MICROSCOPIC  TROPONIN I (HIGH SENSITIVITY)     RADIOLOGY Images were viewed by me  CT head  IMPRESSION:  No acute abnormalities. Chronic small vessel ischemic changes.  Brain MRI was reassuring ____________________________________________   DIFFERENTIAL DIAGNOSIS   CVA, TIA, peripheral vertigo, anxiety, dehydration, migraine, tension headache  FINAL ASSESSMENT AND PLAN  Dizziness, peripheral vertigo   Plan: The patient had presented for persistent dizziness. Patient's labs revealed stable chronic kidney disease, otherwise no acute process. Patient's imaging regarding CT imaging was reassuring. MRI was ordered and she was given standard medications for peripheral vertigo.  I discussed with ENT who recommended steroids.  She was given a dose of Solu-Medrol and will be discharged with a steroid taper.   Laurence Aly, MD    Note: This note was generated in part or whole with voice recognition software. Voice recognition is usually quite accurate but there are transcription errors that can and very often do occur. I apologize for any typographical errors that were not detected and corrected.     Earleen Newport, MD 11/21/19 [phone]

## 2019-11-21 NOTE — ED Notes (Signed)
CBG 68, EDP notified. Pt given orange juice

## 2019-11-28 ENCOUNTER — Encounter: Payer: Self-pay | Admitting: *Deleted

## 2019-11-28 ENCOUNTER — Inpatient Hospital Stay
Admission: EM | Admit: 2019-11-28 | Discharge: 2019-11-29 | DRG: 262 | Payer: Medicare Other | Attending: Internal Medicine | Admitting: Internal Medicine

## 2019-11-28 ENCOUNTER — Emergency Department: Payer: Medicare Other

## 2019-11-28 ENCOUNTER — Other Ambulatory Visit: Payer: Self-pay

## 2019-11-28 DIAGNOSIS — J439 Emphysema, unspecified: Secondary | ICD-10-CM | POA: Diagnosis present

## 2019-11-28 DIAGNOSIS — I340 Nonrheumatic mitral (valve) insufficiency: Secondary | ICD-10-CM | POA: Diagnosis not present

## 2019-11-28 DIAGNOSIS — I129 Hypertensive chronic kidney disease with stage 1 through stage 4 chronic kidney disease, or unspecified chronic kidney disease: Secondary | ICD-10-CM | POA: Diagnosis present

## 2019-11-28 DIAGNOSIS — Z7952 Long term (current) use of systemic steroids: Secondary | ICD-10-CM

## 2019-11-28 DIAGNOSIS — I4891 Unspecified atrial fibrillation: Principal | ICD-10-CM | POA: Diagnosis present

## 2019-11-28 DIAGNOSIS — E1136 Type 2 diabetes mellitus with diabetic cataract: Secondary | ICD-10-CM | POA: Diagnosis not present

## 2019-11-28 DIAGNOSIS — I480 Paroxysmal atrial fibrillation: Secondary | ICD-10-CM | POA: Diagnosis not present

## 2019-11-28 DIAGNOSIS — Z96641 Presence of right artificial hip joint: Secondary | ICD-10-CM | POA: Diagnosis present

## 2019-11-28 DIAGNOSIS — N184 Chronic kidney disease, stage 4 (severe): Secondary | ICD-10-CM | POA: Diagnosis not present

## 2019-11-28 DIAGNOSIS — M1990 Unspecified osteoarthritis, unspecified site: Secondary | ICD-10-CM | POA: Diagnosis not present

## 2019-11-28 DIAGNOSIS — Z20822 Contact with and (suspected) exposure to covid-19: Secondary | ICD-10-CM | POA: Diagnosis present

## 2019-11-28 DIAGNOSIS — J449 Chronic obstructive pulmonary disease, unspecified: Secondary | ICD-10-CM | POA: Diagnosis not present

## 2019-11-28 DIAGNOSIS — Z79899 Other long term (current) drug therapy: Secondary | ICD-10-CM

## 2019-11-28 DIAGNOSIS — I420 Dilated cardiomyopathy: Secondary | ICD-10-CM | POA: Diagnosis not present

## 2019-11-28 DIAGNOSIS — I1310 Hypertensive heart and chronic kidney disease without heart failure, with stage 1 through stage 4 chronic kidney disease, or unspecified chronic kidney disease: Secondary | ICD-10-CM | POA: Diagnosis not present

## 2019-11-28 DIAGNOSIS — N189 Chronic kidney disease, unspecified: Secondary | ICD-10-CM | POA: Diagnosis present

## 2019-11-28 DIAGNOSIS — E039 Hypothyroidism, unspecified: Secondary | ICD-10-CM | POA: Diagnosis present

## 2019-11-28 DIAGNOSIS — Z791 Long term (current) use of non-steroidal anti-inflammatories (NSAID): Secondary | ICD-10-CM | POA: Diagnosis not present

## 2019-11-28 DIAGNOSIS — Z8249 Family history of ischemic heart disease and other diseases of the circulatory system: Secondary | ICD-10-CM | POA: Diagnosis not present

## 2019-11-28 DIAGNOSIS — Z794 Long term (current) use of insulin: Secondary | ICD-10-CM

## 2019-11-28 DIAGNOSIS — Z833 Family history of diabetes mellitus: Secondary | ICD-10-CM | POA: Diagnosis not present

## 2019-11-28 DIAGNOSIS — Z800 Family history of malignant neoplasm of digestive organs: Secondary | ICD-10-CM

## 2019-11-28 DIAGNOSIS — I088 Other rheumatic multiple valve diseases: Secondary | ICD-10-CM | POA: Diagnosis not present

## 2019-11-28 DIAGNOSIS — Z79891 Long term (current) use of opiate analgesic: Secondary | ICD-10-CM

## 2019-11-28 DIAGNOSIS — Z888 Allergy status to other drugs, medicaments and biological substances status: Secondary | ICD-10-CM | POA: Diagnosis not present

## 2019-11-28 DIAGNOSIS — Z7901 Long term (current) use of anticoagulants: Secondary | ICD-10-CM | POA: Diagnosis not present

## 2019-11-28 DIAGNOSIS — K219 Gastro-esophageal reflux disease without esophagitis: Secondary | ICD-10-CM | POA: Diagnosis present

## 2019-11-28 DIAGNOSIS — Z91041 Radiographic dye allergy status: Secondary | ICD-10-CM | POA: Diagnosis not present

## 2019-11-28 DIAGNOSIS — Z87891 Personal history of nicotine dependence: Secondary | ICD-10-CM

## 2019-11-28 DIAGNOSIS — I495 Sick sinus syndrome: Secondary | ICD-10-CM | POA: Diagnosis present

## 2019-11-28 DIAGNOSIS — Z79890 Hormone replacement therapy (postmenopausal): Secondary | ICD-10-CM

## 2019-11-28 DIAGNOSIS — Z95828 Presence of other vascular implants and grafts: Secondary | ICD-10-CM | POA: Diagnosis not present

## 2019-11-28 DIAGNOSIS — I455 Other specified heart block: Secondary | ICD-10-CM

## 2019-11-28 DIAGNOSIS — Z7951 Long term (current) use of inhaled steroids: Secondary | ICD-10-CM

## 2019-11-28 DIAGNOSIS — E1122 Type 2 diabetes mellitus with diabetic chronic kidney disease: Secondary | ICD-10-CM | POA: Diagnosis present

## 2019-11-28 DIAGNOSIS — I361 Nonrheumatic tricuspid (valve) insufficiency: Secondary | ICD-10-CM | POA: Diagnosis not present

## 2019-11-28 LAB — CBC
HCT: 36.7 % (ref 36.0–46.0)
Hemoglobin: 11.6 g/dL — ABNORMAL LOW (ref 12.0–15.0)
MCH: 28.7 pg (ref 26.0–34.0)
MCHC: 31.6 g/dL (ref 30.0–36.0)
MCV: 90.8 fL (ref 80.0–100.0)
Platelets: 191 10*3/uL (ref 150–400)
RBC: 4.04 MIL/uL (ref 3.87–5.11)
RDW: 14.8 % (ref 11.5–15.5)
WBC: 7.8 10*3/uL (ref 4.0–10.5)
nRBC: 0 % (ref 0.0–0.2)

## 2019-11-28 LAB — BASIC METABOLIC PANEL
Anion gap: 10 (ref 5–15)
BUN: 55 mg/dL — ABNORMAL HIGH (ref 8–23)
CO2: 22 mmol/L (ref 22–32)
Calcium: 8.3 mg/dL — ABNORMAL LOW (ref 8.9–10.3)
Chloride: 111 mmol/L (ref 98–111)
Creatinine, Ser: 2.03 mg/dL — ABNORMAL HIGH (ref 0.44–1.00)
GFR calc Af Amer: 25 mL/min — ABNORMAL LOW (ref >60)
GFR calc non Af Amer: 22 mL/min — ABNORMAL LOW (ref >60)
Glucose, Bld: 213 mg/dL — ABNORMAL HIGH (ref 70–99)
Potassium: 3.6 mmol/L (ref 3.5–5.1)
Sodium: 143 mmol/L (ref 135–145)

## 2019-11-28 LAB — MAGNESIUM: Magnesium: 2.3 mg/dL (ref 1.7–2.4)

## 2019-11-28 LAB — URINALYSIS, COMPLETE (UACMP) WITH MICROSCOPIC
Bacteria, UA: NONE SEEN
Bilirubin Urine: NEGATIVE
Glucose, UA: 50 mg/dL — AB
Hgb urine dipstick: NEGATIVE
Ketones, ur: NEGATIVE mg/dL
Leukocytes,Ua: NEGATIVE
Nitrite: NEGATIVE
Protein, ur: NEGATIVE mg/dL
Specific Gravity, Urine: 1.016 (ref 1.005–1.030)
pH: 5 (ref 5.0–8.0)

## 2019-11-28 LAB — TROPONIN I (HIGH SENSITIVITY)
Troponin I (High Sensitivity): 14 ng/L (ref <18)
Troponin I (High Sensitivity): 15 ng/L (ref <18)

## 2019-11-28 LAB — RESPIRATORY PANEL BY RT PCR (FLU A&B, COVID)
Influenza A by PCR: NEGATIVE
Influenza B by PCR: NEGATIVE
SARS Coronavirus 2 by RT PCR: NEGATIVE

## 2019-11-28 LAB — CREATININE, SERUM
Creatinine, Ser: 2.02 mg/dL — ABNORMAL HIGH (ref 0.44–1.00)
GFR calc Af Amer: 25 mL/min — ABNORMAL LOW (ref >60)
GFR calc non Af Amer: 22 mL/min — ABNORMAL LOW (ref >60)

## 2019-11-28 MED ORDER — CALCIUM GLUCONATE 10 % IV SOLN: 1.0000 g | Freq: Once | INTRAVENOUS | Status: AC

## 2019-11-28 MED ORDER — SODIUM CHLORIDE 0.9% FLUSH: 3.0000 mL | Freq: Once | INTRAVENOUS | Status: DC

## 2019-11-28 MED ORDER — CALCIUM GLUCONATE 10 % IV SOLN
INTRAVENOUS | Status: AC
  Administered 2019-11-28: 1 g via INTRAVENOUS
  Filled 2019-11-28: qty 10

## 2019-11-28 MED ORDER — HEPARIN SODIUM (PORCINE) 5000 UNIT/ML IJ SOLN
5000.0000 [IU] | Freq: Three times a day (TID) | INTRAMUSCULAR | Status: DC
  Filled 2019-11-28: qty 1

## 2019-11-28 MED ORDER — CALCIUM GLUCONATE 10 % IV SOLN
INTRAVENOUS | Status: AC
  Administered 2019-11-28: 4.65 meq
  Filled 2019-11-28: qty 10

## 2019-11-28 MED ORDER — LORAZEPAM 2 MG/ML IJ SOLN: 0.5000 mg | Freq: Once | INTRAMUSCULAR | Status: AC

## 2019-11-28 MED ORDER — DOPAMINE-DEXTROSE 3.2-5 MG/ML-% IV SOLN
2.5000 ug/kg/min | INTRAVENOUS | Status: DC
  Administered 2019-11-28: 2.5 ug/kg/min via INTRAVENOUS
  Filled 2019-11-28: qty 250

## 2019-11-28 MED ORDER — CALCIUM GLUCONATE-NACL 1-0.675 GM/50ML-% IV SOLN
1.0000 g | Freq: Once | INTRAVENOUS | Status: DC
  Filled 2019-11-28: qty 50

## 2019-11-28 MED ORDER — ONDANSETRON HCL 4 MG/2ML IJ SOLN: 4.0000 mg | Freq: Four times a day (QID) | INTRAMUSCULAR | Status: DC | PRN

## 2019-11-28 MED ORDER — ACETAMINOPHEN 325 MG PO TABS: 650.0000 mg | ORAL_TABLET | ORAL | Status: DC | PRN

## 2019-11-28 MED ORDER — DILTIAZEM HCL 25 MG/5ML IV SOLN
10.0000 mg | Freq: Once | INTRAVENOUS | Status: AC
  Administered 2019-11-28: 10 mg via INTRAVENOUS
  Filled 2019-11-28: qty 5

## 2019-11-28 MED ORDER — SODIUM CHLORIDE 0.9 % IV SOLN: 1.0000 g | Freq: Once | INTRAVENOUS | Status: DC

## 2019-11-28 MED ORDER — LORAZEPAM 2 MG/ML IJ SOLN
INTRAMUSCULAR | Status: AC
  Administered 2019-11-28: 0.5 mg via INTRAVENOUS
  Filled 2019-11-28: qty 1

## 2019-11-28 NOTE — ED Provider Notes (Signed)
[facility] Emergency Department Provider Note  Time seen: 7:04 PM  I have reviewed the triage vital signs and the nursing notes.   HISTORY  Chief Complaint Chest Pain   HPI Denise [name] is a 86 y.o. female with a past medical history of atrial fibrillation, COPD, gastric reflux, hypertension, presents to the emergency department for dizziness and palpitations. According to the patient over the past week she has been experiencing intermittent dizziness, has been following up with a ENT who has been treating her for vertigo. Patient states today she saw her ENT and her symptoms had resolved however when she got home she began feeling dizzy again and noticed heart palpitations. Upon arrival patient noted to be in atrial fibrillation with rapid ventricular response around 140 bpm.  States mild dizziness at this time. Denies any fever denies any increased shortness of breath or cough.  Past Medical History:  Diagnosis Date  . A-fib ([place])   . Arthritis   . Cataract   . Chronic kidney disease   . COPD (chronic obstructive pulmonary disease) ([place])   . Diabetes mellitus without complication ([place])   . Emphysema of lung ([place])   . GERD (gastroesophageal reflux disease)   . Hypertension   . Hypothyroidism     Patient Active Problem List   Diagnosis Date Noted  . ESRD on dialysis ([place]) 04/11/2017  . Hyperlipidemia 01/05/2017  . Diabetes mellitus type 2 with complications ([place]) [id number]  . Chronic kidney disease 01/05/2017  . Intertrochanteric fracture of right hip ([place]) 08/03/2015  . Hypoglycemia secondary to sulfonylurea 07/29/2015    Past Surgical History:  Procedure Laterality Date  . AV FISTULA PLACEMENT Right 11/18/2015   Procedure: ARTERIOVENOUS (AV) FISTULA CREATION;  Surgeon: Algernon Huxley, MD;  Location: [facility] ORS;  Service: Vascular;  Laterality: Right;  . CHOLECYSTECTOMY    . GALLBLADDER SURGERY    . HIP ARTHROPLASTY Right 07/31/2015   Procedure:  ARTHROPLASTY BIPOLAR HIP (HEMIARTHROPLASTY);  Surgeon: Corky Mull, MD;  Location: [facility] ORS;  Service: Orthopedics;  Laterality: Right;    Prior to Admission medications   Medication Sig Start Date End Date Taking? Authorizing Provider  atorvastatin (LIPITOR) 40 MG tablet Take 40 mg by mouth at bedtime.     [provider]  bisacodyl (DULCOLAX) 10 MG suppository Place 1 suppository (10 mg total) rectally daily as needed for moderate constipation. 08/03/15   Aldean Jewett, MD  Cholecalciferol (VITAMIN D-3 PO) Take 4,000 Units by mouth daily.    [provider]  donepezil (ARICEPT) 10 MG tablet  01/26/15   [provider]  fenofibrate (TRICOR) 145 MG tablet Take 145 mg by mouth daily.    [provider]  Fluticasone Furoate-Vilanterol (BREO ELLIPTA) 100-25 MCG/INH AEPB Inhale 1 puff into the lungs daily.    [provider]  furosemide (LASIX) 40 MG tablet Take 40 mg by mouth.    [provider]  glimepiride (AMARYL) 1 MG tablet  09/08/14   [provider]  HYDROcodone-acetaminophen (NORCO) 5-325 MG tablet Take 1 tablet by mouth every 6 (six) hours as needed for moderate pain. Patient not taking: Reported on 03/01/2017 11/18/15   Algernon Huxley, MD  insulin glargine (LANTUS) 100 UNIT/ML injection Inject into the skin.    [provider]  Lancet Devices (LANCING DEVICE) MISC by Does not apply route.    [provider]  levothyroxine (SYNTHROID, LEVOTHROID) 137 MCG tablet Take 137 mcg by mouth daily.  [provider]  meloxicam (MOBIC) 7.5 MG tablet Take 7.5 mg by mouth 2 (two) times daily after a meal.     [provider]  METOPROLOL SUCCINATE ER PO Take 1 tablet by mouth daily.    [provider]  ondansetron (ZOFRAN) 4 MG tablet Take by mouth.    [provider]  pantoprazole (PROTONIX) 40 MG tablet Take 40 mg by mouth daily.    [provider]  predniSONE (STERAPRED  UNI-PAK 21 TAB) 10 MG (21) TBPK tablet Dispense steroid taper pack as directed 11/21/19   Earleen Newport, MD  senna (SENOKOT) 8.6 MG tablet Take 1 tablet by mouth daily.    [provider]  tiotropium (SPIRIVA) 18 MCG inhalation capsule Place 18 mcg into inhaler and inhale daily.    [provider]  traMADol (ULTRAM) 50 MG tablet Take by mouth. 06/29/17   [provider]  valsartan (DIOVAN) 160 MG tablet Take 160 mg by mouth daily.    [provider]  verapamil (CALAN-SR) 240 MG CR tablet Take 240 mg by mouth at bedtime.    [provider]    Allergies  Allergen Reactions  . Iodinated Diagnostic Agents Anaphylaxis, Other (See Comments) and Rash  . Benzocaine-Chloroxylenol-Hc     Years ago  . Benzonatate Rash    Note: pruritis    Family History  Problem Relation Age of Onset  . Diabetes Mother   . Heart attack Mother   . Stomach cancer Father   . Colon cancer Sister   . Diabetes Sister   . Heart attack Brother   . Colon cancer Brother   . Breast cancer Neg Hx     Social History Social History   Tobacco Use  . Smoking status: Former Smoker    Quit date: 07/28/2013    Years since quitting: 6.3  . Smokeless tobacco: Never Used  Substance Use Topics  . Alcohol use: No  . Drug use: No    Review of Systems Constitutional: Negative for fever. Cardiovascular: Negative for chest pain. Positive for palpitations. Respiratory: Negative for shortness of breath. Gastrointestinal: Negative for abdominal pain, vomiting  Musculoskeletal: Negative for musculoskeletal complaints Neurological: Negative for headache All other ROS negative  ____________________________________________   PHYSICAL EXAM:  VITAL SIGNS: ED Triage Vitals  Enc Vitals Group     BP 11/28/19 1808 139/63     Pulse Rate 11/28/19 1808 (!) 135     Resp 11/28/19 1808 16     Temp 11/28/19 1808 (!) 97.5 F (36.4 C)     Temp Source 11/28/19 1808 Oral     SpO2  11/28/19 1808 99 %     Weight 11/28/19 1829 170 lb (77.1 kg)     Height 11/28/19 1829 5\' 6"  (1.676 m)     Head Circumference --      Peak Flow --      Pain Score 11/28/19 1829 0     Pain Loc --      Pain Edu? --      Excl. in [place]? --    Constitutional: Alert and oriented. Well appearing and in no distress. Eyes: Normal exam ENT      Head: Normocephalic and atraumatic.      Mouth/Throat: Mucous membranes are moist. Cardiovascular: Irregular rhythm rate around 130 bpm. No obvious murmur. Respiratory: Normal respiratory effort without tachypnea nor retractions. Breath sounds are clear Gastrointestinal: Soft and nontender. No distention.  Musculoskeletal: Nontender with normal range of motion in  all extremities.  Neurologic:  Normal speech and language. No gross focal neurologic deficits Skin:  Skin is warm, dry and intact.  Psychiatric: Mood and affect are normal.  ____________________________________________    EKG  EKG viewed and interpreted by myself shows atrial fibrillation with rapid ventricular response at 135 bpm with a narrow QRS, normal axis, normal intervals, nonspecific ST changes  ____________________________________________    RADIOLOGY  Chest x-ray is negative for acute abnormality  ____________________________________________   INITIAL IMPRESSION / ASSESSMENT AND PLAN / ED COURSE  Pertinent labs & imaging results that were available during my care of the patient were reviewed by me and considered in my medical decision making (see chart for details).   Patient presents emergency department for palpitations and dizziness. Patient found to be in A. fib with RVR at 135 bpm. Patient appears to have a history of A. fib follows up with Dr. Humphrey Rolls. We will dose 10 mg of IV diltiazem. Patient's initial lab work is largely reassuring including negative troponin and baseline kidney function. We will check a Covid test as a precaution.  Per record review patient did have  an MRI performed 11/21/2019 for the dizziness symptoms she was experiencing that was negative for acute infarct.  Patient's chest x-ray is negative for acute abnormality labs are largely within normal limits/baseline for the patient including negative troponin.  Patient received 10 mg of diltiazem for her atrial fibrillation however approximately 15 minutes after receiving the IV diltiazem patient began experiencing sinus pauses.  Some of these pauses were lasting 5 to 10 seconds.  Patient sees Dr. Humphrey Rolls.  We tried paging several times with no response, I paged unassigned cardiology physician Dr. Haroldine Laws.  He recommended starting the patient on 2.5 of dopamine as well as an amp of calcium.  After receiving these medications patient remains in a tacky rhythm around 120 bpm but has not had any further sinus pauses.  We have the patient on the zoll monitor as a precaution.  We will admit to the hospitalist service.  Spoke with Dr.Khan.  He recommends continuing the dopamine, avoiding any beta-blockers or calcium channel blockers.  Patient has had no further sinus pauses since starting the dopamine.  We will admit to the intensive care unit.  Spoke with the ICU nurse practitioner who will be admitting the patient.  ZAYLEN BLACKWATER was evaluated in Emergency Department on 11/28/2019 for the symptoms described in the history of present illness. She was evaluated in the context of the global COVID-19 pandemic, which necessitated consideration that the patient might be at risk for infection with the SARS-CoV-2 virus that causes COVID-19. Institutional protocols and algorithms that pertain to the evaluation of patients at risk for COVID-19 are in a state of rapid change based on information released by regulatory bodies including the CDC and federal and state organizations. These policies and algorithms were followed during the patient's care in the ED.  CRITICAL CARE Performed by: Harvest Dark   Total  critical care time: 60 minutes  Critical care time was exclusive of separately billable procedures and treating other patients.  Critical care was necessary to treat or prevent imminent or life-threatening deterioration.  Critical care was time spent personally by me on the following activities: development of treatment plan with patient and/or surrogate as well as nursing, discussions with consultants, evaluation of patient's response to treatment, examination of patient, obtaining history from patient or surrogate, ordering and performing treatments and interventions, ordering and review of laboratory studies, ordering  and review of radiographic studies, pulse oximetry and re-evaluation of patient's condition.   ____________________________________________   FINAL CLINICAL IMPRESSION(S) / ED DIAGNOSES  Sinus pause Atrial fibrillation with rapid ventricular response Tachybrady syndrome   Harvest Dark, MD 11/28/19 2111

## 2019-11-28 NOTE — ED Triage Notes (Signed)
Pt c/o dizziness x 2 wks. Pt has been seeing EENT. Pt has PMH of a-fib and is presently in uncontrolled a-fib. Pt c/o CP w/ the palpitation.

## 2019-11-28 NOTE — ED Notes (Signed)
MD aware of asystole episode. MD at bedside, repeat EKG done and zoll pads placed on pt at this time.

## 2019-11-29 ENCOUNTER — Encounter
Admission: EM | Disposition: A | Discharge status: Other Acute Inpatient Hospital | Payer: Self-pay | Source: Home / Self Care | Attending: Pulmonary Disease

## 2019-11-29 ENCOUNTER — Observation Stay (HOSPITAL_COMMUNITY)
Admission: RE | Admit: 2019-11-29 | Discharge: 2019-11-30 | Disposition: A | Discharge status: Home / Self Care | Payer: Medicare Other | Source: Ambulatory Visit | Attending: Internal Medicine | Admitting: Internal Medicine

## 2019-11-29 ENCOUNTER — Encounter: Payer: Self-pay | Admitting: Cardiology

## 2019-11-29 ENCOUNTER — Encounter (HOSPITAL_COMMUNITY)
Admission: RE | Disposition: A | Discharge status: Home / Self Care | Payer: Self-pay | Source: Ambulatory Visit | Attending: Internal Medicine

## 2019-11-29 ENCOUNTER — Inpatient Hospital Stay (HOSPITAL_COMMUNITY): Payer: Medicare Other

## 2019-11-29 DIAGNOSIS — E1122 Type 2 diabetes mellitus with diabetic chronic kidney disease: Secondary | ICD-10-CM | POA: Insufficient documentation

## 2019-11-29 DIAGNOSIS — Z7901 Long term (current) use of anticoagulants: Secondary | ICD-10-CM | POA: Insufficient documentation

## 2019-11-29 DIAGNOSIS — Z87891 Personal history of nicotine dependence: Secondary | ICD-10-CM | POA: Insufficient documentation

## 2019-11-29 DIAGNOSIS — N189 Chronic kidney disease, unspecified: Secondary | ICD-10-CM | POA: Diagnosis present

## 2019-11-29 DIAGNOSIS — E039 Hypothyroidism, unspecified: Secondary | ICD-10-CM | POA: Insufficient documentation

## 2019-11-29 DIAGNOSIS — E1169 Type 2 diabetes mellitus with other specified complication: Secondary | ICD-10-CM | POA: Diagnosis present

## 2019-11-29 DIAGNOSIS — J449 Chronic obstructive pulmonary disease, unspecified: Secondary | ICD-10-CM | POA: Insufficient documentation

## 2019-11-29 DIAGNOSIS — E785 Hyperlipidemia, unspecified: Secondary | ICD-10-CM | POA: Diagnosis present

## 2019-11-29 DIAGNOSIS — N184 Chronic kidney disease, stage 4 (severe): Secondary | ICD-10-CM | POA: Insufficient documentation

## 2019-11-29 DIAGNOSIS — I4891 Unspecified atrial fibrillation: Principal | ICD-10-CM

## 2019-11-29 DIAGNOSIS — Z95828 Presence of other vascular implants and grafts: Secondary | ICD-10-CM | POA: Insufficient documentation

## 2019-11-29 DIAGNOSIS — I480 Paroxysmal atrial fibrillation: Secondary | ICD-10-CM | POA: Diagnosis not present

## 2019-11-29 DIAGNOSIS — E1136 Type 2 diabetes mellitus with diabetic cataract: Secondary | ICD-10-CM | POA: Insufficient documentation

## 2019-11-29 DIAGNOSIS — Z91041 Radiographic dye allergy status: Secondary | ICD-10-CM | POA: Insufficient documentation

## 2019-11-29 DIAGNOSIS — K219 Gastro-esophageal reflux disease without esophagitis: Secondary | ICD-10-CM | POA: Insufficient documentation

## 2019-11-29 DIAGNOSIS — M1990 Unspecified osteoarthritis, unspecified site: Secondary | ICD-10-CM | POA: Insufficient documentation

## 2019-11-29 DIAGNOSIS — I455 Other specified heart block: Secondary | ICD-10-CM

## 2019-11-29 DIAGNOSIS — Z8249 Family history of ischemic heart disease and other diseases of the circulatory system: Secondary | ICD-10-CM | POA: Insufficient documentation

## 2019-11-29 DIAGNOSIS — I495 Sick sinus syndrome: Secondary | ICD-10-CM | POA: Diagnosis not present

## 2019-11-29 DIAGNOSIS — I088 Other rheumatic multiple valve diseases: Secondary | ICD-10-CM | POA: Insufficient documentation

## 2019-11-29 DIAGNOSIS — I420 Dilated cardiomyopathy: Secondary | ICD-10-CM | POA: Insufficient documentation

## 2019-11-29 DIAGNOSIS — E118 Type 2 diabetes mellitus with unspecified complications: Secondary | ICD-10-CM | POA: Diagnosis present

## 2019-11-29 DIAGNOSIS — Z79899 Other long term (current) drug therapy: Secondary | ICD-10-CM | POA: Insufficient documentation

## 2019-11-29 DIAGNOSIS — Z95818 Presence of other cardiac implants and grafts: Secondary | ICD-10-CM

## 2019-11-29 DIAGNOSIS — Z833 Family history of diabetes mellitus: Secondary | ICD-10-CM | POA: Insufficient documentation

## 2019-11-29 DIAGNOSIS — I1310 Hypertensive heart and chronic kidney disease without heart failure, with stage 1 through stage 4 chronic kidney disease, or unspecified chronic kidney disease: Secondary | ICD-10-CM | POA: Insufficient documentation

## 2019-11-29 DIAGNOSIS — I361 Nonrheumatic tricuspid (valve) insufficiency: Secondary | ICD-10-CM

## 2019-11-29 DIAGNOSIS — I340 Nonrheumatic mitral (valve) insufficiency: Secondary | ICD-10-CM

## 2019-11-29 DIAGNOSIS — Z888 Allergy status to other drugs, medicaments and biological substances status: Secondary | ICD-10-CM | POA: Insufficient documentation

## 2019-11-29 HISTORY — PX: TEMPORARY PACEMAKER: CATH118268

## 2019-11-29 HISTORY — PX: PACEMAKER IMPLANT: EP1218

## 2019-11-29 LAB — BASIC METABOLIC PANEL
Anion gap: 11 (ref 5–15)
BUN: 46 mg/dL — ABNORMAL HIGH (ref 8–23)
CO2: 21 mmol/L — ABNORMAL LOW (ref 22–32)
Calcium: 8.7 mg/dL — ABNORMAL LOW (ref 8.9–10.3)
Chloride: 113 mmol/L — ABNORMAL HIGH (ref 98–111)
Creatinine, Ser: 1.92 mg/dL — ABNORMAL HIGH (ref 0.44–1.00)
GFR calc Af Amer: 27 mL/min — ABNORMAL LOW (ref >60)
GFR calc non Af Amer: 23 mL/min — ABNORMAL LOW (ref >60)
Glucose, Bld: 131 mg/dL — ABNORMAL HIGH (ref 70–99)
Potassium: 4.4 mmol/L (ref 3.5–5.1)
Sodium: 145 mmol/L (ref 135–145)

## 2019-11-29 LAB — ECHOCARDIOGRAM COMPLETE
Height: 66 in
Weight: 2687.85 oz

## 2019-11-29 LAB — CBC WITH DIFFERENTIAL/PLATELET
Abs Immature Granulocytes: 0.11 10*3/uL — ABNORMAL HIGH (ref 0.00–0.07)
Basophils Absolute: 0 10*3/uL (ref 0.0–0.1)
Basophils Relative: 0 %
Eosinophils Absolute: 0.1 10*3/uL (ref 0.0–0.5)
Eosinophils Relative: 1 %
HCT: 36.5 % (ref 36.0–46.0)
Hemoglobin: 11.5 g/dL — ABNORMAL LOW (ref 12.0–15.0)
Immature Granulocytes: 1 %
Lymphocytes Relative: 15 %
Lymphs Abs: 1.2 10*3/uL (ref 0.7–4.0)
MCH: 29 pg (ref 26.0–34.0)
MCHC: 31.5 g/dL (ref 30.0–36.0)
MCV: 91.9 fL (ref 80.0–100.0)
Monocytes Absolute: 0.7 10*3/uL (ref 0.1–1.0)
Monocytes Relative: 8 %
Neutro Abs: 6.1 10*3/uL (ref 1.7–7.7)
Neutrophils Relative %: 75 %
Platelets: 166 10*3/uL (ref 150–400)
RBC: 3.97 MIL/uL (ref 3.87–5.11)
RDW: 14.6 % (ref 11.5–15.5)
WBC: 8.2 10*3/uL (ref 4.0–10.5)
nRBC: 0 % (ref 0.0–0.2)

## 2019-11-29 LAB — GLUCOSE, CAPILLARY
Glucose-Capillary: 120 mg/dL — ABNORMAL HIGH (ref 70–99)
Glucose-Capillary: 109 mg/dL — ABNORMAL HIGH (ref 70–99)
Glucose-Capillary: 108 mg/dL — ABNORMAL HIGH (ref 70–99)
Glucose-Capillary: 279 mg/dL — ABNORMAL HIGH (ref 70–99)
Glucose-Capillary: 367 mg/dL — ABNORMAL HIGH (ref 70–99)

## 2019-11-29 LAB — MRSA PCR SCREENING: MRSA by PCR: NEGATIVE

## 2019-11-29 LAB — SURGICAL PCR SCREEN
MRSA, PCR: NEGATIVE
Staphylococcus aureus: POSITIVE — AB

## 2019-11-29 LAB — MAGNESIUM: Magnesium: 2.5 mg/dL — ABNORMAL HIGH (ref 1.7–2.4)

## 2019-11-29 LAB — TSH: TSH: 1.968 u[IU]/mL (ref 0.350–4.500)

## 2019-11-29 SURGERY — PACEMAKER IMPLANT

## 2019-11-29 SURGERY — TEMPORARY PACEMAKER

## 2019-11-29 MED ORDER — SODIUM CHLORIDE 0.9 % IV SOLN: INTRAVENOUS | Status: DC

## 2019-11-29 MED ORDER — SODIUM CHLORIDE 0.9 % IV SOLN
80.0000 mg | INTRAVENOUS | Status: DC
  Filled 2019-11-29: qty 2

## 2019-11-29 MED ORDER — TIOTROPIUM BROMIDE MONOHYDRATE 18 MCG IN CAPS: 1.0000 | ORAL_CAPSULE | Freq: Every day | RESPIRATORY_TRACT | Status: DC

## 2019-11-29 MED ORDER — HEPARIN SODIUM (PORCINE) 5000 UNIT/ML IJ SOLN: 5000.0000 [IU] | Freq: Three times a day (TID) | INTRAMUSCULAR | Status: DC

## 2019-11-29 MED ORDER — ACETAMINOPHEN 325 MG PO TABS: 325.0000 mg | ORAL_TABLET | ORAL | Status: DC | PRN

## 2019-11-29 MED ORDER — PANTOPRAZOLE SODIUM 40 MG PO TBEC
40.0000 mg | DELAYED_RELEASE_TABLET | Freq: Every morning | ORAL | Status: DC
  Administered 2019-11-29 – 2019-11-30 (×2): 40 mg via ORAL
  Filled 2019-11-29 (×2): qty 1

## 2019-11-29 MED ORDER — CEFAZOLIN SODIUM-DEXTROSE 2-4 GM/100ML-% IV SOLN
INTRAVENOUS | Status: AC
  Filled 2019-11-29: qty 100

## 2019-11-29 MED ORDER — METHYLPREDNISOLONE SODIUM SUCC 125 MG IJ SOLR
125.0000 mg | Freq: Once | INTRAMUSCULAR | Status: AC
  Administered 2019-11-29: 125 mg via INTRAVENOUS
  Filled 2019-11-29: qty 2

## 2019-11-29 MED ORDER — ACETAMINOPHEN 325 MG PO TABS
325.0000 mg | ORAL_TABLET | ORAL | Status: DC | PRN
  Administered 2019-11-30: 325 mg via ORAL
  Filled 2019-11-29: qty 2

## 2019-11-29 MED ORDER — MORPHINE SULFATE (PF) 2 MG/ML IV SOLN
INTRAVENOUS | Status: AC
  Administered 2019-11-29: 2 mg via INTRAVENOUS
  Filled 2019-11-29: qty 1

## 2019-11-29 MED ORDER — MORPHINE SULFATE (PF) 2 MG/ML IV SOLN: 1.0000 mg | INTRAVENOUS | Status: DC | PRN

## 2019-11-29 MED ORDER — LEVOTHYROXINE SODIUM 100 MCG PO TABS
100.0000 ug | ORAL_TABLET | Freq: Every day | ORAL | Status: DC
  Administered 2019-11-30: 100 ug via ORAL
  Filled 2019-11-29: qty 1

## 2019-11-29 MED ORDER — INSULIN ASPART 100 UNIT/ML ~~LOC~~ SOLN
1.0000 [IU] | SUBCUTANEOUS | Status: DC
  Administered 2019-11-29: 3 [IU] via SUBCUTANEOUS

## 2019-11-29 MED ORDER — UMECLIDINIUM BROMIDE 62.5 MCG/INH IN AEPB
1.0000 | INHALATION_SPRAY | Freq: Every day | RESPIRATORY_TRACT | Status: DC
  Filled 2019-11-29 (×2): qty 7

## 2019-11-29 MED ORDER — ONDANSETRON HCL 4 MG/2ML IJ SOLN: 4.0000 mg | Freq: Four times a day (QID) | INTRAMUSCULAR | 0 refills | Status: DC | PRN

## 2019-11-29 MED ORDER — CEFAZOLIN SODIUM-DEXTROSE 2-4 GM/100ML-% IV SOLN
2.0000 g | INTRAVENOUS | Status: AC
  Administered 2019-11-29: 2 g via INTRAVENOUS
  Filled 2019-11-29: qty 100

## 2019-11-29 MED ORDER — MIDAZOLAM HCL 2 MG/2ML IJ SOLN
INTRAMUSCULAR | Status: AC
  Filled 2019-11-29: qty 2

## 2019-11-29 MED ORDER — MIDAZOLAM HCL 2 MG/2ML IJ SOLN
INTRAMUSCULAR | Status: DC | PRN
  Administered 2019-11-29: 1 mg via INTRAVENOUS

## 2019-11-29 MED ORDER — LIDOCAINE HCL (PF) 1 % IJ SOLN
INTRAMUSCULAR | Status: DC | PRN
  Administered 2019-11-29: 17:00:00 60 mL

## 2019-11-29 MED ORDER — ACETAMINOPHEN 325 MG PO TABS: 650.0000 mg | ORAL_TABLET | ORAL | Status: DC | PRN

## 2019-11-29 MED ORDER — HEPARIN (PORCINE) IN NACL 1000-0.9 UT/500ML-% IV SOLN
INTRAVENOUS | Status: AC
  Filled 2019-11-29: qty 500

## 2019-11-29 MED ORDER — CHLORHEXIDINE GLUCONATE CLOTH 2 % EX PADS
6.0000 | MEDICATED_PAD | Freq: Every day | CUTANEOUS | Status: DC
  Administered 2019-11-29: 6 via TOPICAL

## 2019-11-29 MED ORDER — ONDANSETRON HCL 4 MG/2ML IJ SOLN: 4.0000 mg | Freq: Four times a day (QID) | INTRAMUSCULAR | Status: DC | PRN

## 2019-11-29 MED ORDER — SODIUM CHLORIDE 0.9% FLUSH: 3.0000 mL | Freq: Once | INTRAVENOUS | 0 refills | Status: DC

## 2019-11-29 MED ORDER — NYSTATIN 100000 UNIT/GM EX CREA
1.0000 "application " | TOPICAL_CREAM | Freq: Two times a day (BID) | CUTANEOUS | Status: DC
  Administered 2019-11-30: 1 via TOPICAL
  Filled 2019-11-29: qty 15

## 2019-11-29 MED ORDER — CHLORHEXIDINE GLUCONATE CLOTH 2 % EX PADS
6.0000 | MEDICATED_PAD | Freq: Every day | CUTANEOUS | Status: DC
  Administered 2019-11-30: 6 via TOPICAL

## 2019-11-29 MED ORDER — PNEUMOCOCCAL VAC POLYVALENT 25 MCG/0.5ML IJ INJ: 0.5000 mL | INJECTION | INTRAMUSCULAR | Status: DC

## 2019-11-29 MED ORDER — CHLORHEXIDINE GLUCONATE 4 % EX LIQD: 60.0000 mL | Freq: Once | CUTANEOUS | Status: DC

## 2019-11-29 MED ORDER — HEPARIN (PORCINE) IN NACL 1000-0.9 UT/500ML-% IV SOLN
INTRAVENOUS | Status: DC | PRN
  Administered 2019-11-29: 500 mL

## 2019-11-29 MED ORDER — FENTANYL CITRATE (PF) 100 MCG/2ML IJ SOLN
INTRAMUSCULAR | Status: AC
  Filled 2019-11-29: qty 2

## 2019-11-29 MED ORDER — DIPHENHYDRAMINE HCL 25 MG PO CAPS
50.0000 mg | ORAL_CAPSULE | Freq: Once | ORAL | Status: AC
  Filled 2019-11-29: qty 2

## 2019-11-29 MED ORDER — CHLORHEXIDINE GLUCONATE 4 % EX LIQD
60.0000 mL | Freq: Once | CUTANEOUS | Status: AC
  Administered 2019-11-29: 4 via TOPICAL

## 2019-11-29 MED ORDER — ACETAMINOPHEN 325 MG PO TABS
325.0000 mg | ORAL_TABLET | ORAL | Status: DC | PRN
  Administered 2019-11-29: 650 mg via ORAL
  Filled 2019-11-29: qty 2

## 2019-11-29 MED ORDER — MUPIROCIN 2 % EX OINT
1.0000 "application " | TOPICAL_OINTMENT | Freq: Two times a day (BID) | CUTANEOUS | Status: DC
  Administered 2019-11-29: 1 via NASAL
  Filled 2019-11-29: qty 22

## 2019-11-29 MED ORDER — INSULIN ASPART 100 UNIT/ML ~~LOC~~ SOLN
0.0000 [IU] | Freq: Three times a day (TID) | SUBCUTANEOUS | Status: DC
  Administered 2019-11-30: 5 [IU] via SUBCUTANEOUS

## 2019-11-29 MED ORDER — IRBESARTAN 150 MG PO TABS
75.0000 mg | ORAL_TABLET | Freq: Every day | ORAL | Status: DC
  Administered 2019-11-29 – 2019-11-30 (×2): 75 mg via ORAL
  Filled 2019-11-29 (×2): qty 1

## 2019-11-29 MED ORDER — HYDRALAZINE HCL 10 MG PO TABS
10.0000 mg | ORAL_TABLET | Freq: Four times a day (QID) | ORAL | Status: DC | PRN
  Administered 2019-11-30: 10 mg via ORAL
  Filled 2019-11-29: qty 1

## 2019-11-29 MED ORDER — DIPHENHYDRAMINE HCL 50 MG/ML IJ SOLN
50.0000 mg | Freq: Once | INTRAMUSCULAR | Status: AC
  Administered 2019-11-29: 50 mg via INTRAVENOUS
  Filled 2019-11-29: qty 1

## 2019-11-29 MED ORDER — ATORVASTATIN CALCIUM 40 MG PO TABS
40.0000 mg | ORAL_TABLET | Freq: Every morning | ORAL | Status: DC
  Administered 2019-11-29 – 2019-11-30 (×2): 40 mg via ORAL
  Filled 2019-11-29 (×2): qty 1

## 2019-11-29 MED ORDER — CEFAZOLIN SODIUM-DEXTROSE 1-4 GM/50ML-% IV SOLN
1.0000 g | Freq: Four times a day (QID) | INTRAVENOUS | Status: AC
  Administered 2019-11-29 – 2019-11-30 (×3): 1 g via INTRAVENOUS
  Filled 2019-11-29 (×3): qty 50

## 2019-11-29 MED ORDER — FENTANYL CITRATE (PF) 100 MCG/2ML IJ SOLN
INTRAMUSCULAR | Status: DC | PRN
  Administered 2019-11-29: 25 ug via INTRAVENOUS

## 2019-11-29 MED ORDER — SODIUM CHLORIDE 0.9 % IV SOLN
INTRAVENOUS | Status: AC
  Filled 2019-11-29: qty 2

## 2019-11-29 MED ORDER — LIDOCAINE HCL 1 % IJ SOLN
INTRAMUSCULAR | Status: AC
  Filled 2019-11-29: qty 60

## 2019-11-29 MED ORDER — ALLOPURINOL 100 MG PO TABS
100.0000 mg | ORAL_TABLET | Freq: Every evening | ORAL | Status: DC
  Administered 2019-11-29: 100 mg via ORAL
  Filled 2019-11-29: qty 1

## 2019-11-29 SURGICAL SUPPLY — 8 items
CABLE SURGICAL S-101-97-12 (CABLE) ×2 IMPLANT
LEAD TENDRIL MRI 52CM LPA1200M (Lead) ×2 IMPLANT
LEAD TENDRIL MRI 58CM LPA1200M (Lead) ×1 IMPLANT
PACEMAKER ASSURITY DR-RF (Pacemaker) ×1 IMPLANT
PAD PRO RADIOLUCENT 2001M-C (PAD) ×2 IMPLANT
SHEATH 7FR PRELUDE SNAP 13 (SHEATH) ×1 IMPLANT
SHEATH 8FR PRELUDE SNAP 13 (SHEATH) ×2 IMPLANT
TRAY PACEMAKER INSERTION (PACKS) ×2 IMPLANT

## 2019-11-29 SURGICAL SUPPLY — 9 items
CABLE ADAPT PACING TEMP 12FT (ADAPTER) ×1 IMPLANT
KIT MANI 3VAL PERCEP (MISCELLANEOUS) ×1 IMPLANT
NDL PERC 18GX7CM (NEEDLE) IMPLANT
NEEDLE PERC 18GX7CM (NEEDLE) ×2 IMPLANT
PACK CARDIAC CATH (CUSTOM PROCEDURE TRAY) ×2 IMPLANT
SHEATH AVANTI 6FR X 11CM (SHEATH) ×1 IMPLANT
SLEEVE REPOSITIONING LENGTH 30 (MISCELLANEOUS) ×1 IMPLANT
SUT SILK 0 FSL (SUTURE) ×1 IMPLANT
WIRE PACING TEMP ST TIP 5 (CATHETERS) ×1 IMPLANT

## 2019-11-29 NOTE — Progress Notes (Signed)
Pt left floor with [facility] transport to [facility]. Report given by this RN, IV's checked for patency, [facility] switched pacer box to their own. Pt VVI paced at 70. VSS, pt awake and alert, pt verbalized understanding of transport and had no further questions.

## 2019-11-29 NOTE — ED Notes (Signed)
Purwick placed on pt

## 2019-11-29 NOTE — ED Notes (Signed)
Per MD Kasa change dopamine to 65mcg/kg

## 2019-11-29 NOTE — Progress Notes (Signed)
Blood pressures on [id number] systolic, asymptomatic, PA made aware with order.

## 2019-11-29 NOTE — Progress Notes (Signed)
Transferred -in from the cath lab by bed awake and alert. Left arm sling in used and instructed  to avoid  moving affected arm.

## 2019-11-29 NOTE — Consult Note (Signed)
[facility] Cardiology  CARDIOLOGY CONSULT NOTE  Patient ID: Denise [name] MRN: [id number] DOB/AGE: 84-Mar-1931 84 y.o.  Admit date: 11/28/2019 Referring Physician Kasa Primary Physician [place]. Reason for Consultation pauses  HPI: 86 year old female referred for evaluation of pauses.  Patient has had a recent history of intermittent dizziness, evaluated by ENT for vertigo.  She represents to [facility] ED complaining of dizziness and palpitations.  ECG reveals atrial fibrillation with rapid ventricular rate.  Patient was treated with diltiazem, developed 5-second pauses.  She was treated with dopamine infusion and calcium with persistent 5-second pauses.  Review of systems complete and found to be negative unless listed above     Past Medical History:  Diagnosis Date  . A-fib ([place])   . Arthritis   . Cataract   . Chronic kidney disease   . COPD (chronic obstructive pulmonary disease) ([place])   . Diabetes mellitus without complication ([place])   . Emphysema of lung ([place])   . GERD (gastroesophageal reflux disease)   . Hypertension   . Hypothyroidism     Past Surgical History:  Procedure Laterality Date  . AV FISTULA PLACEMENT Right 11/18/2015   Procedure: ARTERIOVENOUS (AV) FISTULA CREATION;  Surgeon: Algernon Huxley, MD;  Location: [facility] ORS;  Service: Vascular;  Laterality: Right;  . CHOLECYSTECTOMY    . GALLBLADDER SURGERY    . HIP ARTHROPLASTY Right 07/31/2015   Procedure: ARTHROPLASTY BIPOLAR HIP (HEMIARTHROPLASTY);  Surgeon: Corky Mull, MD;  Location: [facility] ORS;  Service: Orthopedics;  Laterality: Right;    Medications Prior to Admission  Medication Sig Dispense Refill Last Dose  . atorvastatin (LIPITOR) 40 MG tablet Take 40 mg by mouth at bedtime.      . bisacodyl (DULCOLAX) 10 MG suppository Place 1 suppository (10 mg total) rectally daily as needed for moderate constipation. 12 suppository 0   . Cholecalciferol (VITAMIN D-3 PO) Take 4,000 Units by mouth daily.      Marland Kitchen donepezil (ARICEPT) 10 MG tablet      . fenofibrate (TRICOR) 145 MG tablet Take 145 mg by mouth daily.     . Fluticasone Furoate-Vilanterol (BREO ELLIPTA) 100-25 MCG/INH AEPB Inhale 1 puff into the lungs daily.     . furosemide (LASIX) 40 MG tablet Take 40 mg by mouth.     Marland Kitchen glimepiride (AMARYL) 1 MG tablet      . HYDROcodone-acetaminophen (NORCO) 5-325 MG tablet Take 1 tablet by mouth every 6 (six) hours as needed for moderate pain. (Patient not taking: Reported on 03/01/2017) 30 tablet 0   . insulin glargine (LANTUS) 100 UNIT/ML injection Inject into the skin.     Elmore Guise Devices (LANCING DEVICE) MISC by Does not apply route.     Marland Kitchen levothyroxine (SYNTHROID, LEVOTHROID) 137 MCG tablet Take 137 mcg by mouth daily.      . meloxicam (MOBIC) 7.5 MG tablet Take 7.5 mg by mouth 2 (two) times daily after a meal.      . METOPROLOL SUCCINATE ER PO Take 1 tablet by mouth daily.     . ondansetron (ZOFRAN) 4 MG tablet Take by mouth.     . pantoprazole (PROTONIX) 40 MG tablet Take 40 mg by mouth daily.     . predniSONE (STERAPRED UNI-PAK 21 TAB) 10 MG (21) TBPK tablet Dispense steroid taper pack as directed 21 tablet 0   . senna (SENOKOT) 8.6 MG tablet Take 1 tablet by mouth daily.     Marland Kitchen tiotropium (SPIRIVA) 18 MCG inhalation capsule  Place 18 mcg into inhaler and inhale daily.     . traMADol (ULTRAM) 50 MG tablet Take by mouth.     . valsartan (DIOVAN) 160 MG tablet Take 160 mg by mouth daily.     . verapamil (CALAN-SR) 240 MG CR tablet Take 240 mg by mouth at bedtime.      Social History   Socioeconomic History  . Marital status: Widowed    Spouse name: Not on file  . Number of children: Not on file  . Years of education: Not on file  . Highest education level: Not on file  Occupational History  . Not on file  Tobacco Use  . Smoking status: Former Smoker    Quit date: 07/28/2013    Years since quitting: 6.3  . Smokeless tobacco: Never Used  Substance and Sexual Activity  . Alcohol use: No   . Drug use: No  . Sexual activity: Never    Birth control/protection: Abstinence  Other Topics Concern  . Not on file  Social History Narrative  . Not on file   Social Determinants of Health   Financial Resource Strain:   . Difficulty of Paying Living Expenses: Not on file  Food Insecurity:   . Worried About Charity fundraiser in the Last Year: Not on file  . Ran Out of Food in the Last Year: Not on file  Transportation Needs:   . Lack of Transportation (Medical): Not on file  . Lack of Transportation (Non-Medical): Not on file  Physical Activity:   . Days of Exercise per Week: Not on file  . Minutes of Exercise per Session: Not on file  Stress:   . Feeling of Stress : Not on file  Social Connections:   . Frequency of Communication with Friends and Family: Not on file  . Frequency of Social Gatherings with Friends and Family: Not on file  . Attends Religious Services: Not on file  . Active Member of Clubs or Organizations: Not on file  . Attends Archivist Meetings: Not on file  . Marital Status: Not on file  Intimate Partner Violence:   . Fear of Current or Ex-Partner: Not on file  . Emotionally Abused: Not on file  . Physically Abused: Not on file  . Sexually Abused: Not on file    Family History  Problem Relation Age of Onset  . Diabetes Mother   . Heart attack Mother   . Stomach cancer Father   . Colon cancer Sister   . Diabetes Sister   . Heart attack Brother   . Colon cancer Brother   . Breast cancer Neg Hx       Review of systems complete and found to be negative unless listed above      PHYSICAL EXAM  General: Well developed, well nourished, in no acute distress HEENT:  Normocephalic and atramatic Neck:  No JVD.  Lungs: Clear bilaterally to auscultation and percussion. Heart: HRRR . Normal S1 and S2 without gallops or murmurs.  Abdomen: Bowel sounds are positive, abdomen soft and non-tender  Msk:  Back normal, normal gait. Normal  strength and tone for age. Extremities: No clubbing, cyanosis or edema.   Neuro: Alert and oriented X 3. Psych:  Good affect, responds appropriately  Labs:   Lab Results  Component Value Date   WBC 7.8 11/28/2019   HGB 11.6 (L) 11/28/2019   HCT 36.7 11/28/2019   MCV 90.8 11/28/2019   PLT 191 11/28/2019  Recent Labs  Lab 11/28/19 1823 11/28/19 1823 11/28/19 2013  NA 143  --   --   K 3.6  --   --   CL 111  --   --   CO2 22  --   --   BUN 55*  --   --   CREATININE 2.03*   < > 2.02*  CALCIUM 8.3*  --   --   GLUCOSE 213*  --   --    < > = values in this interval not displayed.   Lab Results  Component Value Date   [id number] (H) 11/04/2013   CKMB 3.0 11/04/2013   TROPONINI <0.03 07/29/2015   No results found for: CHOL No results found for: HDL No results found for: LDLCALC No results found for: TRIG No results found for: CHOLHDL No results found for: LDLDIRECT    Radiology: DG Chest 2 View  Result Date: 11/28/2019 CLINICAL DATA:  86 year old female with chest pain. EXAM: CHEST - 2 VIEW COMPARISON:  Chest radiograph dated 11/06/2019. FINDINGS: No focal consolidation, pleural effusion, pneumothorax. COPD changes and mild diffuse chronic interstitial coarsening. The cardiac silhouette is within normal limits. Atherosclerotic calcification of the aorta. No acute osseous pathology. IMPRESSION: 1. No acute cardiopulmonary process. 2. Emphysema. Electronically Signed   By: Anner [place] M.D.   On: 11/28/2019 19:20   DG Chest 2 View  Result Date: 11/06/2019 CLINICAL DATA:  Chest pain EXAM: CHEST - 2 VIEW COMPARISON:  07/29/2015 FINDINGS: Large lung volumes. There is no edema, consolidation, effusion, or pneumothorax. Normal heart size and negative mediastinal contours. IMPRESSION: 1. No evidence of acute disease. 2. Hyperinflation correlating with COPD history. Electronically Signed   By: Monte Fantasia M.D.   On: 11/06/2019 05:29   CT Head Wo Contrast  Result Date:  11/21/2019 CLINICAL DATA:  Persistent dizziness. EXAM: CT HEAD WITHOUT CONTRAST TECHNIQUE: Contiguous axial images were obtained from the base of the skull through the vertex without intravenous contrast. COMPARISON:  CT scan dated 11/06/2019 FINDINGS: Brain: No evidence of acute infarction, hemorrhage, hydrocephalus, extra-axial collection or mass lesion/mass effect. Mild atrophy. Chronic periventricular white matter lucency consistent with small vessel ischemic changes, stable. Vascular: No hyperdense vessel or unexpected calcification. Skull: Normal. Negative for fracture or focal lesion. Sinuses/Orbits: Normal. Other: None IMPRESSION: No acute abnormalities. Chronic small vessel ischemic changes. Electronically Signed   By: Lorriane Shire M.D.   On: 11/21/2019 10:39   CT Head Wo Contrast  Result Date: 11/06/2019 CLINICAL DATA:  Dizziness, headache. EXAM: CT HEAD WITHOUT CONTRAST TECHNIQUE: Contiguous axial images were obtained from the base of the skull through the vertex without intravenous contrast. COMPARISON:  Brain MRI 12/26/2009, report from head CT 05/22/1998 (images unavailable) FINDINGS: Brain: No evidence of acute intracranial hemorrhage. No demarcated cortical infarction. No evidence of intracranial mass. No midline shift or extra-axial fluid collection. Moderate ill-defined hypoattenuation within the cerebral white matter which is most prominent in the left frontal lobe, nonspecific but consistent with chronic small vessel ischemic disease. Mild generalized parenchymal atrophy. Vascular: No hyperdense vessel. Skull: Normal. Negative for fracture or focal lesion. Sinuses/Orbits: Visualized orbits demonstrate no acute abnormality. No significant paranasal sinus disease or mastoid effusion at the imaged levels. IMPRESSION: No CT evidence of acute intracranial abnormality. Moderate chronic small vessel ischemic disease. Mild generalized parenchymal atrophy. Electronically Signed   By: Kellie Simmering DO    On: 11/06/2019 08:02   MR BRAIN WO CONTRAST  Result Date: 11/21/2019 CLINICAL DATA:  Ataxia, stroke  suspected. Additional history provided: Dizziness for 3-4 days, history of atrial fibrillation EXAM: MRI HEAD WITHOUT CONTRAST TECHNIQUE: Multiplanar, multiecho pulse sequences of the brain and surrounding structures were obtained without intravenous contrast. COMPARISON:  Head CT 11/21/2019, brain MRI 12/26/2009 FINDINGS: Brain: There is no evidence of acute infarct. No evidence of intracranial mass. No midline shift or extra-axial fluid collection. No chronic intracranial blood products. Moderate patchy T2/FLAIR hyperintensity within the cerebral white matter has progressed from MRI 12/26/2009. Findings are nonspecific, but consistent with chronic small vessel ischemic disease. Moderate generalized parenchymal atrophy. Vascular: Flow voids maintained within the proximal large arterial vessels. Skull and upper cervical spine: No focal marrow lesion. Sinuses/Orbits: Visualized orbits demonstrate no acute abnormality. Trace ethmoid sinus mucosal thickening. No significant mastoid effusion. IMPRESSION: No evidence of acute infarct or other acute intracranial abnormality. Moderate chronic small vessel ischemic disease, progressed from MRI 12/26/2009. Moderate generalized parenchymal atrophy. Electronically Signed   By: Kellie Simmering DO   On: 11/21/2019 12:30   CARDIAC CATHETERIZATION  Result Date: 11/29/2019 Successful temporary transvenous pacemaker via right jugular vein   EKG: Atrial fibrillation with rapid ventricular rate  ASSESSMENT AND PLAN:   1.  Atrial fibrillation with rapid ventricular rate, with intermittent, symptomatic pauses of 5 seconds or greater  Recommendations  1.  Urgent transvenous temporary pacemaker.  The risks, benefits and alternatives of transvenous temporary pacemaker were explained to the patient and informed consent was obtained. 2.  Further treatment including AV nodal  blocking agents and possible permanent pacemaker pending transvenous temporary pacemaker  Signed: Isaias Cowman MD,PhD, [facility] 11/29/2019, 1:57 AM

## 2019-11-29 NOTE — Discharge Summary (Signed)
Physician Discharge Summary  Patient ID: Denise [name] MRN: [id number] DOB/AGE: Feb 26, 1930 86 y.o.  Admit date: 11/28/2019 Discharge date: 11/29/2019                DISCHARGE SUMMARY   Denise [name] is a 86 y.o. y/o female with a PMH of atrial fibrillation, COPD, gastric reflux, hypertension, presents to the emergency department for dizziness and palpitations. According to the patient over the past week she has been experiencing intermittent dizziness, has been following up with a ENT who has been treating her for vertigo. Patient states today she saw her ENT and her symptoms had resolved, however when she got home she began feeling dizzy again and noticed heart palpitations. Upon arrival patient noted to be in atrial fibrillation with rapid ventricular response around 140 bpm.States mild dizziness at this time. Denies any fever denies any increased shortness of breath or cough.  Patient was given 10 mg iv cardizem x1 dose--developed sinus pauses-->given 1 amp of calcium and placed on dopamine infusion @2 .5 mcg/kg/min -->still developing persistent symptomatic pauses of 5 seconds or greater.  She subsequently required emergent right internal jugular temporary transvenous pacemaker insertion 11/29/2019(inserted by Dr. Saralyn Pilar).  Pacemaker setting: rate 70/output 5.0/senser 2.0. Pt pending transfer to [facility] accepting Cardiologist Dr. Haroldine Laws.  MICRO DATA  COVID-19 by PCR 02/4>>negative  Influenza PCR 02/4>>negative   ANTIBIOTICS None   CONSULTS Intensivist   TUBES / LINES Right internal jugular temporary transvenous pacemaker 02/5>>  Discharge Exam: General: pleasant and cooperative female, resting in bed, in NAD. Neuro: A&O x 3, non-focal.  HEENT: [place]/AT. PERRL, sclerae anicteric. Cardiovascular: sinus tach, no R/G  Lungs: Respirations even and unlabored.  CTA bilaterally  Abdomen: BS x 4, soft, NT/ND.  Musculoskeletal: No gross deformities, no edema.  Skin: right internal  jugular temporary transvenous pacemaker intact    Vitals:   11/29/19 0015 11/29/19 0030 11/29/19 0045 11/29/19 0212  BP: (!) 201/111 (!) 158/101 (!) 179/94 134/79  Pulse: (!) 122 (!) 114 (!) 122 (!) 120  Resp: (!) 21 (!) 23 (!) 26 18  Temp:    97.9 F (36.6 C)  TempSrc:    Oral  SpO2: 97% 96% 95% 100%  Weight:      Height:         Discharge Labs  BMET Recent Labs  Lab 11/28/19 1823 11/28/19 2013  NA 143  --   K 3.6  --   CL 111  --   CO2 22  --   GLUCOSE 213*  --   BUN 55*  --   CREATININE 2.03* 2.02*  CALCIUM 8.3*  --   MG  --  2.3    CBC Recent Labs  Lab 11/28/19 1823  HGB 11.6*  HCT 36.7  WBC 7.8  PLT 191    Anti-Coagulation No results for input(s): INR in the last 168 hours.        Allergies as of 11/29/2019      Reactions   Iodinated Diagnostic Agents Anaphylaxis, Other (See Comments), Rash   Benzocaine-chloroxylenol-hc    Years ago   Benzonatate Rash   Note: pruritis      Medication List    STOP taking these medications   atorvastatin 40 MG tablet Commonly known as: LIPITOR   bisacodyl 10 MG suppository Commonly known as: DULCOLAX   Breo Ellipta 100-25 MCG/INH Aepb Generic drug: fluticasone furoate-vilanterol   donepezil 10 MG tablet Commonly known as: ARICEPT   fenofibrate 145 MG tablet Commonly known  as: TRICOR   furosemide 40 MG tablet Commonly known as: LASIX   glimepiride 1 MG tablet Commonly known as: AMARYL   HYDROcodone-acetaminophen 5-325 MG tablet Commonly known as: Psychologist, occupational   Lantus 100 UNIT/ML injection Generic drug: insulin glargine   levothyroxine 137 MCG tablet Commonly known as: SYNTHROID   meloxicam 7.5 MG tablet Commonly known as: MOBIC   METOPROLOL SUCCINATE ER PO   ondansetron 4 MG tablet Commonly known as: ZOFRAN Replaced by: ondansetron 4 MG/2ML Soln injection   pantoprazole 40 MG tablet Commonly known as: PROTONIX   predniSONE 10 MG (21) Tbpk tablet Commonly  known as: STERAPRED UNI-PAK 21 TAB   senna 8.6 MG tablet Commonly known as: SENOKOT   tiotropium 18 MCG inhalation capsule Commonly known as: SPIRIVA   traMADol 50 MG tablet Commonly known as: ULTRAM   valsartan 160 MG tablet Commonly known as: DIOVAN   verapamil 240 MG CR tablet Commonly known as: CALAN-SR   VITAMIN D-3 PO     TAKE these medications   acetaminophen 325 MG tablet Commonly known as: TYLENOL Take 2 tablets (650 mg total) by mouth every 4 (four) hours as needed for mild pain (temp > 101.5).   heparin 5000 UNIT/ML injection Inject 1 mL (5,000 Units total) into the skin every 8 (eight) hours.   ondansetron 4 MG/2ML Soln injection Commonly known as: ZOFRAN Inject 2 mLs (4 mg total) into the vein every 6 (six) hours as needed for nausea. Replaces: ondansetron 4 MG tablet   sodium chloride flush 0.9 % Soln Commonly known as: NS Inject 3 mLs into the vein once for 1 dose.        Disposition: Transferring to [place], [place] Pager [phone] (please enter 7 digits) PCCM Consult Pager [phone] (please enter 7 digits)

## 2019-11-29 NOTE — Progress Notes (Signed)
Contacted [facility] to request pt transport for transfer to [facility] bed assignment 2H Bed 13.  [facility] stated they would contact RN once ambulance is available for transport. Pt s/p right internal jugular temporary transvenous pacemaker placement. She is currently alert and oriented, follows commands, cardiac rhythm sinus tach with hr 117-120's, bp stable map >65, and O2 sats upper 90's.  I attempted to contact pts daughter Clarice Pole to update her regarding changes in pt condition, however she did not answer the phone. I left a voicemail instructing her to return my phone call.  Will continue to monitor and assess pt.   Marda Stalker, [place] Pager [phone] (please enter 7 digits) PCCM Consult Pager [phone] (please enter 7 digits)

## 2019-11-29 NOTE — Progress Notes (Signed)
Bed alarm was activated and nurse found the patient found sitting on the side of the bed. Patient had removed tele leads, gown, sling, and peripheral IV. IV catheter was intact with minimal bleeding at IV site. Patient has been confused tonight. Many attempts have been made to reorient the patient, utilize distractions such as music and television, and the family has been called. Patient continues to have confusion.  Patient reoriented and placed back into bed. Reeducated patient on the importance of staying in bed and protecting the side of her pacemaker site.  MD Coniglio notified of the incident and patient's status. Received orders for a telesitter.  In addition, received prn orders for patient's high systolic blood pressure and a modified sensitive sliding scale for patient's hyperglycemia. Received verbal confirmation to give additional dose of insulin tonight for CBG of 367 which will amount to 15 units of insulin.

## 2019-11-29 NOTE — Consult Note (Signed)
CRITICAL CARE NOTE  CC  Sinus pauses and afib with RVR   86 y.o. female with a past medical history of atrial fibrillation, COPD, gastric reflux, hypertension, presents to the emergency department for dizziness and palpitations. According to the patient over the past week she has been experiencing intermittent dizziness, has been following up with a ENT who has been treating her for vertigo. Patient states today she saw her ENT and her symptoms had resolved however when she got home she began feeling dizzy again and noticed heart palpitations. Upon arrival patient noted to be in atrial fibrillation with rapid ventricular response around 140 bpm. States mild dizziness at this time. Denies any fever denies any increased shortness of breath or cough.  Patient was given DILT--developed sinus pauses-->given calcioum placed on dopamine infusion-->still developing sinus pauses   Dr Neoma Laming  Cardiologist on Call for this patient He was paged and called several times but no answer I have paged and called him several times, no answer.  I HAVE MADE MULTIPLE PHONE CALLS TO HELP THIS PATIENT.  Dr.Bensimon and Dr. Miquel Dunn were available by phone and were very helpful to take care of this patient.  I had to stop dopamine because BNP was 220/110    BP (!) 184/100   Pulse (!) 127   Temp (!) 97.5 F (36.4 C) (Oral)   Resp (!) 21   Ht 5\' 6"  (1.676 m)   Wt 77.1 kg   SpO2 97%   BMI 27.44 kg/m    No intake/output data recorded. No intake/output data recorded.  SpO2: 97 %   Review of Systems:  Gen:  Denies  fever, sweats, chills weight loss  HEENT: +double vision,dizziness Cardiac: +palpitations Resp:   No cough, -sputum production, -shortness of breath,-wheezing, -hemoptysis,  Gi: Denies swallowing difficulty, stomach pain, nausea or vomiting, diarrhea, constipation, bowel incontinence Gu:  Denies bladder incontinence, burning urine Ext:   Denies Joint pain, stiffness or  swelling Skin: Denies  skin rash, easy bruising or bleeding or hives Endoc:  Denies polyuria, polydipsia , polyphagia or weight change Psych:   Denies depression, insomnia or hallucinations  Other:  All other systems negative   PHYSICAL EXAMINATION:  GENERAL:ill appearing,  HEAD: Normocephalic, atraumatic.  EYES: Pupils equal, round, reactive to light.  No scleral icterus.  MOUTH: Moist mucosal membrane. NECK: Supple.  PULMONARY no crackles CARDIOVASCULAR: S1 and S2. Regular rate and rhythm. No murmurs, rubs, or gallops.  GASTROINTESTINAL: Soft, nontender, -distended.  Positive bowel sounds.   MUSCULOSKELETAL: No swelling, clubbing, or edema.  NEUROLOGIC: alert and awake SKIN:intact,warm,dry  MEDICATIONS: I have reviewed all medications and confirmed regimen as documented   CULTURE RESULTS   Recent Results (from the past 240 hour(s))  Respiratory Panel by RT PCR (Flu A&B, Covid) - Nasopharyngeal Swab     Status: None   Collection Time: 11/28/19  8:12 PM   Specimen: Nasopharyngeal Swab  Result Value Ref Range Status   SARS Coronavirus 2 by RT PCR NEGATIVE NEGATIVE Final    Comment: (NOTE) SARS-CoV-2 target nucleic acids are NOT DETECTED. The SARS-CoV-2 RNA is generally detectable in upper respiratoy specimens during the acute phase of infection. The lowest concentration of SARS-CoV-2 viral copies this assay can detect is 131 copies/mL. A negative result does not preclude SARS-Cov-2 infection and should not be used as the sole basis for treatment or other patient management decisions. A negative result may occur with  improper specimen collection/handling, submission of specimen other than nasopharyngeal swab, presence  of viral mutation(s) within the areas targeted by this assay, and inadequate number of viral copies (<131 copies/mL). A negative result must be combined with clinical observations, patient history, and epidemiological information. The expected result is  Negative. Fact Sheet for Patients:  PinkCheek.be Fact Sheet for Healthcare Providers:  GravelBags.it This test is not yet ap proved or cleared by the [place] FDA and  has been authorized for detection and/or diagnosis of SARS-CoV-2 by FDA under an Emergency Use Authorization (EUA). This EUA will remain  in effect (meaning this test can be used) for the duration of the COVID-19 declaration under Section 564(b)(1) of the Act, 21 U.S.C. section 360bbb-3(b)(1), unless the authorization is terminated or revoked sooner.    Influenza A by PCR NEGATIVE NEGATIVE Final   Influenza B by PCR NEGATIVE NEGATIVE Final    Comment: (NOTE) The Xpert Xpress SARS-CoV-2/FLU/RSV assay is intended as an aid in  the diagnosis of influenza from Nasopharyngeal swab specimens and  should not be used as a sole basis for treatment. Nasal washings and  aspirates are unacceptable for Xpert Xpress SARS-CoV-2/FLU/RSV  testing. Fact Sheet for Patients: PinkCheek.be Fact Sheet for Healthcare Providers: GravelBags.it This test is not yet approved or cleared by the [place] FDA and  has been authorized for detection and/or diagnosis of SARS-CoV-2 by  FDA under an Emergency Use Authorization (EUA). This EUA will remain  in effect (meaning this test can be used) for the duration of the  Covid-19 declaration under Section 564(b)(1) of the Act, 21  U.S.C. section 360bbb-3(b)(1), unless the authorization is  terminated or revoked. Performed at [facility], [place], [place] [address]           IMAGING    DG Chest 2 View  Result Date: 11/28/2019 CLINICAL DATA:  86 year old female with chest pain. EXAM: CHEST - 2 VIEW COMPARISON:  Chest radiograph dated 11/06/2019. FINDINGS: No focal consolidation, pleural effusion, pneumothorax. COPD changes and mild diffuse chronic  interstitial coarsening. The cardiac silhouette is within normal limits. Atherosclerotic calcification of the aorta. No acute osseous pathology. IMPRESSION: 1. No acute cardiopulmonary process. 2. Emphysema. Electronically Signed   By: Anner Crete M.D.   On: 11/28/2019 19:20      ASSESSMENT AND PLAN SYNOPSIS  SEVERE AFIB WITH RVR WITH SINUS PAUSES PATIENT WILL NEED TRANSVENOUS PACER AND EMERGENT TRANSFER TO [place]   CARDIAC ICU monitoring   ENDO - will use ICU hypoglycemic\Hyperglycemia protocol if indicated   ELECTROLYTES -follow labs as needed -replace as needed -pharmacy consultation and following   DVT/GI PRX ordered TRANSFUSIONS AS NEEDED MONITOR FSBS ASSESS the need for LABS as needed   Critical Care Time devoted to patient care services described in this note is 45 minutes.   Overall, patient is critically ill, prognosis is guarded.  high risk for cardiac arrest and death.    Corrin Parker, M.D.  [facility]  Medical Director [place] Director [facility] Cardio-Pulmonary Department

## 2019-11-29 NOTE — ED Notes (Signed)
Cardiology at bedside.

## 2019-11-29 NOTE — ED Notes (Addendum)
MD aware of frequent, 3-4 sec pauses in heart rhythm. Pt states she feels like she is going to pass out, pt has not had any loss of consciousness at this time

## 2019-11-29 NOTE — ED Notes (Signed)
Intensivest at bedside 

## 2019-11-29 NOTE — H&P (Addendum)
Cardiology Admission History and Physical:   Patient ID: DERI DEGUIRE MRN: [id number]; DOB: Jan 23, 1930   Admission date: 11/29/2019  Primary Care Provider: Perrin Maltese, MD Primary Cardiologist: Dr. Humphrey Rolls Primary Electrophysiologist:  none  Chief Complaint:  Symptomatic bradycardia, tachy-brady  Patient Profile:   Denise [name] is a 86 y.o. female with PMHx COPD, DM, HTN, hypothyroidism, CKD( IV, has AV fistula, not on HD, Dr. Candiss Norse), anemia of chronic disease, ? There is mention of CAD in some notes.  History of Present Illness:   Ms. Baechle presented to [facility] with recurrent dizzy spells and palpitations, she was found in Afib w/RVR 140's treated with dilt initially though developed pauses of at least 5 seconds that continued to happen despite dopamine and calcium given Seems CCM was managing at [facility], initially having trouble getting the patient attending cardiologist, with escalating BPs of 220/110 stopped the dopa.     After inability to contact the patient's attending cardiologist, Dr. Josefa Half was consulted and placed an emergent transvenous temp pacing wire and was transferred to [facility] for further care and management  LABS K+ 3.6 BUN/Creat 55/2.03 Mag 2.3 WBC 7.8, 8.2 H/H 11/36 Plts 166  HS Trop 14, 15  COVID/influenza negative   She denies any symptoms currently, feeling much better.  She denies any kind of CP In the last few weeks has been having random dizzy spells, not particularly positional or exertional, has not fainted.  Dizzy spells have been brief Yesterday she developed palpitations, felt like her heart was "going crazy", no CP  She sees Dr. Chancy Milroy, reports having had stress tests in the past, but not any cardiac procedures or surgeries She has no knowledge of any formal diagnosis of CAD or arrhythmias   Heart Pathway Score:     Past Medical History:  Diagnosis Date   A-fib [facility])    Arthritis    Cataract    Chronic kidney disease    COPD (chronic  obstructive pulmonary disease) ([place])    Diabetes mellitus without complication ([place])    Emphysema of lung ([place])    GERD (gastroesophageal reflux disease)    Hypertension    Hypothyroidism     Past Surgical History:  Procedure Laterality Date   AV FISTULA PLACEMENT Right 11/18/2015   Procedure: ARTERIOVENOUS (AV) FISTULA CREATION;  Surgeon: Algernon Huxley, MD;  Location: [facility] ORS;  Service: Vascular;  Laterality: Right;   CHOLECYSTECTOMY     GALLBLADDER SURGERY     HIP ARTHROPLASTY Right 07/31/2015   Procedure: ARTHROPLASTY BIPOLAR HIP (HEMIARTHROPLASTY);  Surgeon: Corky Mull, MD;  Location: [facility] ORS;  Service: Orthopedics;  Laterality: Right;   TEMPORARY PACEMAKER N/A 11/29/2019   Procedure: TEMPORARY PACEMAKER;  Surgeon: Isaias Cowman, MD;  Location: [place] CV LAB;  Service: Cardiovascular;  Laterality: N/A;     Medications Prior to Admission: Prior to Admission medications   Medication Sig Start Date End Date Taking? Authorizing Provider  acetaminophen (TYLENOL) 325 MG tablet Take 1-2 tablets (325-650 mg total) by mouth every 4 (four) hours as needed for mild pain (temp > 101.5). 11/29/19   Awilda Bill, NP  heparin 5000 UNIT/ML injection Inject 1 mL (5,000 Units total) into the skin every 8 (eight) hours. 11/29/19   Awilda Bill, NP  ondansetron (ZOFRAN) 4 MG/2ML SOLN injection Inject 2 mLs (4 mg total) into the vein every 6 (six) hours as needed for nausea. 11/29/19   Awilda Bill, NP  sodium chloride flush (NS) 0.9 % SOLN  Inject 3 mLs into the vein once for 1 dose. 11/29/19 11/29/19  Awilda Bill, NP     Allergies:    Allergies  Allergen Reactions   Iodinated Diagnostic Agents Anaphylaxis, Other (See Comments) and Rash   Benzocaine-Chloroxylenol-Hc     Years ago   Benzonatate Rash    Note: pruritis    Social History:   Social History   Socioeconomic History   Marital status: Widowed    Spouse name: Not on file   Number of children: Not on file   Years  of education: Not on file   Highest education level: Not on file  Occupational History   Not on file  Tobacco Use   Smoking status: Former Smoker    Quit date: 07/28/2013    Years since quitting: 6.3   Smokeless tobacco: Never Used  Substance and Sexual Activity   Alcohol use: No   Drug use: No   Sexual activity: Never    Birth control/protection: Abstinence  Other Topics Concern   Not on file  Social History Narrative   Not on file   Social Determinants of Health   Financial Resource Strain:    Difficulty of Paying Living Expenses: Not on file  Food Insecurity:    Worried About Charity fundraiser in the Last Year: Not on file   [employer] of Food in the Last Year: Not on file  Transportation Needs:    Lack of Transportation (Medical): Not on file   Lack of Transportation (Non-Medical): Not on file  Physical Activity:    Days of Exercise per Week: Not on file   Minutes of Exercise per Session: Not on file  Stress:    Feeling of Stress : Not on file  Social Connections:    Frequency of Communication with Friends and Family: Not on file   Frequency of Social Gatherings with Friends and Family: Not on file   Attends Religious Services: Not on file   Active Member of Clubs or Organizations: Not on file   Attends Archivist Meetings: Not on file   Marital Status: Not on file  Intimate Partner Violence:    Fear of Current or Ex-Partner: Not on file   Emotionally Abused: Not on file   Physically Abused: Not on file   Sexually Abused: Not on file    Family History:   The patient's family history includes Colon cancer in her brother and sister; Diabetes in her mother and sister; Heart attack in her brother and mother; Stomach cancer in her father. There is no history of Breast cancer.    ROS:  Please see the history of present illness.  All other ROS reviewed and negative.     Physical Exam/Data:   Vitals:   11/29/19 0811 11/29/19 0815 11/29/19 0830 11/29/19  0845  BP:  (!) 145/58 (!) 129/48 (!) 126/49  Pulse:  (!) 57 (!) 58 (!) 59  Resp:  19 19 (!) 21  Temp: 97.7 F (36.5 C)     TempSrc: Oral     SpO2:  97% 98% 98%  Weight:   76.2 kg   Height:   5\' 6"  (1.676 m)     Intake/Output Summary (Last 24 hours) at 11/29/2019 1025 Last data filed at 11/29/2019 0800 Gross per 24 hour  Intake 10 ml  Output --  Net 10 ml   Last 3 Weights 11/29/2019 11/29/2019 11/28/2019  Weight (lbs) 167 lb 15.9 oz 167 lb 15.9 oz 170 lb  Weight (kg) 76.2 kg 76.2 kg 77.111 kg     Body mass index is 27.11 kg/m.  General:  Well nourished, well developed, in no acute distress HEENT: normal Lymph: no adenopathy Neck: no JVD, temp wire at R IJ Endocrine:  No thryomegaly Vascular: No carotid bruits Cardiac:  RRR; no murmurs, gallops or rubs, she has an AVfistula/graft RUE Lungs:  CTA b/l, no wheezing, rhonchi or rales  Abd: soft, nontender, no hepatomegaly  Ext: no edema Musculoskeletal:  No deformities, age appropriate atrophy Skin: warm and dry  Neuro:  No gross focal abnormalities noted Psych:  Normal affect    EKG:  are personally reviewed with Dr. Curt Bears  Today #1 Afib 135bpm, no ischemic looking changes #2 AFib 106bpm, no ST/T changes appreciated  11/21/2019 AFib 128bpm  07/29/2015 SB 58bpm, normal intervals    Relevant CV Studies:  No historical echos found, ordered for here  Laboratory Data:  High Sensitivity Troponin:   Recent Labs  Lab 11/06/19 0504 11/06/19 1055 11/21/19 1009 11/28/19 1823 11/28/19 2013  TROPONINIHS 12 11 11 14 15       Chemistry Recent Labs  Lab 11/28/19 1823 11/28/19 2013  NA 143  --   K 3.6  --   CL 111  --   CO2 22  --   GLUCOSE 213*  --   BUN 55*  --   CREATININE 2.03* 2.02*  CALCIUM 8.3*  --   GFRNONAA 22* 22*  GFRAA 25* 25*  ANIONGAP 10  --     No results for input(s): PROT, ALBUMIN, AST, ALT, ALKPHOS, BILITOT in the last 168 hours. Hematology Recent Labs  Lab 11/28/19 1823 11/29/19 0910  WBC  7.8 8.2  RBC 4.04 3.97  HGB 11.6* 11.5*  HCT 36.7 36.5  MCV 90.8 91.9  MCH 28.7 29.0  MCHC 31.6 31.5  RDW 14.8 14.6  PLT 191 166   BNPNo results for input(s): BNP, PROBNP in the last 168 hours.  DDimer No results for input(s): DDIMER in the last 168 hours.   Radiology/Studies:   DG Chest 2 View Result Date: 11/28/2019 CLINICAL DATA:  86 year old female with chest pain. EXAM: CHEST - 2 VIEW COMPARISON:  Chest radiograph dated 11/06/2019. FINDINGS: No focal consolidation, pleural effusion, pneumothorax. COPD changes and mild diffuse chronic interstitial coarsening. The cardiac silhouette is within normal limits. Atherosclerotic calcification of the aorta. No acute osseous pathology. IMPRESSION: 1. No acute cardiopulmonary process. 2. Emphysema. Electronically Signed   By: Anner Crete M.D.   On: 11/28/2019 19:20     Assessment and Plan:   1. New onset Afib w/RVR     EKG from Jan is AFib      CHA2DS2Vasc is 5, 6 if CAD     Post pacing Brittain Hosie need to start a/c  2. Tachy-brady     Strips with pauses are not available for review in Epic or paper chart     Dr. Josefa Half reported in his note reports symptomatic pauses of 5 seconds or greater     unknownn if these were post termination or not     She is s/p temp wire     Maintaining SR here  Dr. Curt Bears has seen and examined the patient Discussed rational for PPM implant the procedure, potential risks and benefits, she is agreeable to proceed  She has contrast allergy w/anaphylaxis I get a allergy alert with solumedrol and her listed allergy to Benzocaine-chloroxylenol-hx (with an unknown reaction), in d/w pharmacy, she got solumedrol in Jan  without reports of allergy and felt with the listed allergy that she could safely get the solumedrol    3. HTN     Looks OK     Once home meds list is completed, post procedure we Lakethia Coppess reconcile   4. DM     Has SSI ordered      For questions or updates, please contact [place] Please consult [email or web address] for contact info under        Signed, Baldwin Jamaica, PA-C  11/29/2019 10:25 AM   I have seen and examined this patient with Tommye Standard.  Agree with above, note added to reflect my findings.  On exam, RRR, no murmurs, lungs clear.   Patient [facility] to [facility] with dizziness found to be in AF. Given diltiazem and had significant pauses with a temporary wire implant. Due to that, Ladarius Seubert plan for pacemaker for tachy/brady syndrome.  CHELCEA SABO has presented today for surgery, with the diagnosis of tachy/brady syndrome.  The various methods of treatment have been discussed with the patient and family. After consideration of risks, benefits and other options for treatment, the patient has consented to  Procedure(s): Pacemaker implant as a surgical intervention .  Risks include but not limited to bleeding, tamponade, infection, pneumothorax, among others. The patient's history has been reviewed, patient examined, no change in status, stable for surgery.  I have reviewed the patient's chart and labs.  Questions were answered to the patient's satisfaction.    Jsean Taussig Curt Bears, MD 11/29/2019 1:22 PM   Zanyia Silbaugh M. Ennis Heavner MD 11/29/2019 1:19 PM

## 2019-11-29 NOTE — Progress Notes (Signed)
Echocardiogram 2D Echocardiogram has been performed.  Oneal Deputy Senior 11/29/2019, 11:13 AM   Dr. Gardiner Rhyme notified of stat

## 2019-11-29 NOTE — Progress Notes (Signed)
Complained of pain on IV site while flushing with saline. Iv removed.

## 2019-11-30 ENCOUNTER — Inpatient Hospital Stay (HOSPITAL_COMMUNITY): Payer: Medicare Other

## 2019-11-30 DIAGNOSIS — I420 Dilated cardiomyopathy: Secondary | ICD-10-CM | POA: Diagnosis not present

## 2019-11-30 DIAGNOSIS — I455 Other specified heart block: Secondary | ICD-10-CM

## 2019-11-30 DIAGNOSIS — I495 Sick sinus syndrome: Secondary | ICD-10-CM | POA: Diagnosis not present

## 2019-11-30 DIAGNOSIS — I480 Paroxysmal atrial fibrillation: Secondary | ICD-10-CM | POA: Diagnosis present

## 2019-11-30 DIAGNOSIS — I4891 Unspecified atrial fibrillation: Secondary | ICD-10-CM | POA: Diagnosis not present

## 2019-11-30 DIAGNOSIS — I088 Other rheumatic multiple valve diseases: Secondary | ICD-10-CM | POA: Diagnosis not present

## 2019-11-30 LAB — CBC
HCT: 32.2 % — ABNORMAL LOW (ref 36.0–46.0)
Hemoglobin: 10.2 g/dL — ABNORMAL LOW (ref 12.0–15.0)
MCH: 28.4 pg (ref 26.0–34.0)
MCHC: 31.7 g/dL (ref 30.0–36.0)
MCV: 89.7 fL (ref 80.0–100.0)
Platelets: 158 10*3/uL (ref 150–400)
RBC: 3.59 MIL/uL — ABNORMAL LOW (ref 3.87–5.11)
RDW: 14.5 % (ref 11.5–15.5)
WBC: 6.7 10*3/uL (ref 4.0–10.5)
nRBC: 0 % (ref 0.0–0.2)

## 2019-11-30 LAB — BASIC METABOLIC PANEL
Anion gap: 10 (ref 5–15)
BUN: 49 mg/dL — ABNORMAL HIGH (ref 8–23)
CO2: 21 mmol/L — ABNORMAL LOW (ref 22–32)
Calcium: 8 mg/dL — ABNORMAL LOW (ref 8.9–10.3)
Chloride: 110 mmol/L (ref 98–111)
Creatinine, Ser: 2.04 mg/dL — ABNORMAL HIGH (ref 0.44–1.00)
GFR calc Af Amer: 25 mL/min — ABNORMAL LOW (ref >60)
GFR calc non Af Amer: 21 mL/min — ABNORMAL LOW (ref >60)
Glucose, Bld: 293 mg/dL — ABNORMAL HIGH (ref 70–99)
Potassium: 4.2 mmol/L (ref 3.5–5.1)
Sodium: 141 mmol/L (ref 135–145)

## 2019-11-30 LAB — GLUCOSE, CAPILLARY
Glucose-Capillary: 218 mg/dL — ABNORMAL HIGH (ref 70–99)
Glucose-Capillary: 188 mg/dL — ABNORMAL HIGH (ref 70–99)

## 2019-11-30 MED ORDER — PNEUMOCOCCAL VAC POLYVALENT 25 MCG/0.5ML IJ INJ: 0.5000 mL | INJECTION | INTRAMUSCULAR | 0 refills | Status: AC

## 2019-11-30 MED ORDER — METOPROLOL SUCCINATE ER 100 MG PO TB24: 100.0000 mg | ORAL_TABLET | Freq: Every day | ORAL | 1 refills | Status: DC

## 2019-11-30 MED ORDER — METOPROLOL SUCCINATE ER 100 MG PO TB24
100.0000 mg | ORAL_TABLET | Freq: Every day | ORAL | Status: DC
  Administered 2019-11-30: 100 mg via ORAL
  Filled 2019-11-30: qty 1

## 2019-11-30 MED ORDER — APIXABAN 2.5 MG PO TABS
2.5000 mg | ORAL_TABLET | Freq: Two times a day (BID) | ORAL | Status: DC
  Administered 2019-11-30: 2.5 mg via ORAL
  Filled 2019-11-30: qty 1

## 2019-11-30 MED ORDER — APIXABAN 2.5 MG PO TABS: 2.5000 mg | ORAL_TABLET | Freq: Two times a day (BID) | ORAL | 1 refills | Status: DC

## 2019-11-30 NOTE — Progress Notes (Signed)
Pharmacist explained regarding eliquis medication. Pt's pharmacy is going to close at 2 PM. Removed PIV access and gave discharge instructions to pt's daughter [name]. She understood it well.  Patient's daughter took her all belongings. HS [employer]

## 2019-11-30 NOTE — Care Management (Signed)
30 day Eliquis card provided to patient

## 2019-11-30 NOTE — Care Management Obs Status (Signed)
[place] NOTIFICATION   Patient Details  Name: Denise [name] MRN: [id number] Date of Birth: 16-Mar-1930   Medicare Observation Status Notification Given:  Yes    Carles Collet, RN 11/30/2019, 10:22 AM

## 2019-11-30 NOTE — Care Management CC44 (Signed)
Condition Code 44 Documentation Completed  Patient Details  Name: Denise [name] MRN: [id number] Date of Birth: [birth year]   Condition Code 44 given:  Yes Patient signature on Condition Code 44 notice:  Yes Documentation of 2 MD's agreement:  Yes Code 44 added to claim:  Yes    Carles Collet, RN 11/30/2019, 10:22 AM

## 2019-11-30 NOTE — Discharge Summary (Addendum)
Discharge Summary    Patient ID: Denise [name] MRN: [id number]; DOB: 1930-08-21  Admit date: 11/29/2019 Discharge date: 11/30/2019  Primary Care Provider: Perrin Maltese, MD  Primary Cardiologist: Dr. Humphrey Rolls  Primary Electrophysiologist: Dr. Curt [name]  Discharge Diagnoses    Principal Problem:   Atrial fibrillation with RVR [facility]) Active Problems:   Hyperlipidemia   Diabetes mellitus type 2 with complications [facility])   Chronic kidney disease   Sinus pause  Diagnostic Studies/Procedures    PPM placement 11/29/2019:   CONCLUSIONS:   1. Successful implantation of a [employer] Jude Medical Assurity MRI dual-chamber pacemaker for symptomatic bradycardia  2. No early apparent complications.        Echocardiogram 11/29/2019:  1. Left ventricular ejection fraction, by visual estimation, is 60 to  65%. The left ventricle has normal function. There is mildly increased  left ventricular hypertrophy.  2. Left ventricular diastolic parameters are indeterminate.  3. Global right ventricle has normal systolic function.The right  ventricular size is normal.  4. Left atrial size was mildly dilated.  5. Right atrial size was moderately dilated.  6. Moderate mitral annular calcification.  7. The mitral valve is normal in structure. Mild mitral valve  regurgitation.  8. The tricuspid valve is normal in structure. Tricuspid valve  regurgitation is mild.  9. The aortic valve is tricuspid. Aortic valve regurgitation is not  visualized. Mild aortic valve sclerosis without stenosis.  10. The pulmonic valve was not well visualized. Pulmonic valve  regurgitation is mild.  11. The tricuspid regurgitant velocity is 2.62 m/s, and with an assumed  right atrial pressure of 15 mmHg, the estimated right ventricular systolic  pressure is moderately elevated at 42.5 mmHg.  12. The inferior vena cava is dilated in size with <50% respiratory  variability, suggesting right atrial pressure of 15 mmHg.   History  of Present Illness     Denise [name] is a 86 y.o. female with a PMHx COPD, DM, HTN, hypothyroidism, CKD( IV, has AV fistula, not on HD, Dr. Candiss Norse), anemia of chronic disease, and questionable mention of CAD in some notes.  Hospital Course     Denise [name] presented to [facility] with recurrent dizzy spells and palpitations, found to be in Afib w/RVR in the 140's which was treated with diltiazem initially though developed pauses of at least 5 seconds that continued despite dopamine and calcium given.  CCM was initially managing at [facility]. There was difficulty obtaining communication with the patients attending cardiologist as the patient was having escalating BPs of 220/110. Her dopamine was eventually stopped.   After inability to contact the patient's attending cardiologist, Dr. Josefa Half was consulted and placed an emergent transvenous temp pacing wire and was transferred to [facility] for further care and management.   At the time of [facility] arrival, she denied any symptoms and was feeling much better. She denied any kind of CP. She did report that in the last few weeks, she was having random dizzy spells, not particularly positional or exertional, however had no syncope. Dizzy spells were brief  She follows with Dr. Chancy Milroy and reports having had a stress test in the past, but not any cardiac procedures or surgeries. She had no knowledge of any formal diagnosis of CAD or arrhythmias.   She was seen by Dr. Curt [name] on 11/29/2019 with plans to pursue permanent PPM placement for tachybradycardia syndrome.  This was performed 11/29/2019 with successful implantation of St. Jude medical Assurity MRI dual chamber PPM for symptomatic bradycardia with no  apparent early complications.  Echocardiogram also performed which showed a normal LV EF at 60 to 65% with mild LVH, intermediate diastolic dysfunction and moderately dilated RA.   Patient noted to have a contrast dye allergy with anaphylaxis with an allergy listed to Solu-Medrol  therefore she was premedicated with Solu-Medrol and Benzocaine-chloroxylenol-hx (with an unknown reaction). Case discussed with pharmacy at which time it was found that she received Solu-Medrol in January without reports of allergy and felt that she could safely receive Solu-Medrol for premedication.   Hospital problems include: -New onset atrial fibrillation with RVR Elevated CHA2DS2VASc score is 5>> 6 of CAD Plan to start anticoagulation with Eliquis 2.5 mg p.o. daily given age and elevated creatinine at 2.04 We Denise [name] increase her metoprolol to 100 mg p.o. daily  -HTN Elevated, 169/59, 132/46, 134/45 Plan to increase beta-blocker to 100 mg p.o. daily  Has close follow-up   -DM Resume home medications at discharge  -Chronic CKD stage III Creatinine hovers in the 2.0 range Creatinine on day of discharge, 2.04   Consultants: None  The patient was seen and examined by Dr. Curt [name] who feels that she is stable and ready for discharge today, 11/30/2019.  General: Well developed, well nourished, NAD Neck: Negative for carotid bruits. No JVD Lungs:Clear to ausculation bilaterally. No wheezes, rales, or rhonchi. Breathing is unlabored. Cardiovascular: RRR with S1 S2. Extremities: No edema. DP pulses 2+ bilaterally Neuro: Alert and oriented. No focal deficits. No facial asymmetry. MAE spontaneously. Psych: Responds to questions appropriately with normal affect.     Did the patient have an acute coronary syndrome (MI, NSTEMI, STEMI, etc) this admission?:  No                               Did the patient have a percutaneous coronary intervention (stent / angioplasty)?:  No.   _____________  Discharge Vitals Blood pressure (!) 169/59, pulse 64, temperature 97.8 F (36.6 C), temperature source Oral, resp. rate 20, height 5\' 6"  (1.676 m), weight 83.3 kg, SpO2 99 %.  Filed Weights   11/29/19 0830 11/30/19 0505  Weight: 76.2 kg 83.3 kg    Labs & Radiologic Studies    CBC Recent Labs      11/29/19 0910 11/30/19 0213  WBC 8.2 6.7  NEUTROABS 6.1  --   HGB 11.5* 10.2*  HCT 36.5 32.2*  MCV 91.9 89.7  PLT 166 0000000   Basic Metabolic Panel Recent Labs    11/28/19 1823 11/28/19 2013 11/29/19 0910 11/30/19 0213  NA   < >  --  145 141  K   < >  --  4.4 4.2  CL   < >  --  113* 110  CO2   < >  --  21* 21*  GLUCOSE   < >  --  131* 293*  BUN   < >  --  46* 49*  CREATININE   < > 2.02* 1.92* 2.04*  CALCIUM   < >  --  8.7* 8.0*  MG  --  2.3 2.5*  --    < > = values in this interval not displayed.   Liver Function Tests No results for input(s): AST, ALT, ALKPHOS, BILITOT, PROT, ALBUMIN in the last 72 hours. No results for input(s): LIPASE, AMYLASE in the last 72 hours. High Sensitivity Troponin:   Recent Labs  Lab 11/06/19 0504 11/06/19 1055 11/21/19 1009 11/28/19 1823 11/28/19 2013  TROPONINIHS 12  11 11 14 15     BNP Invalid input(s): POCBNP D-Dimer No results for input(s): DDIMER in the last 72 hours. Hemoglobin A1C No results for input(s): HGBA1C in the last 72 hours. Fasting Lipid Panel No results for input(s): CHOL, HDL, LDLCALC, TRIG, CHOLHDL, LDLDIRECT in the last 72 hours. Thyroid Function Tests Recent Labs    11/29/19 0910  TSH 1.968   _____________  DG Chest 2 View  Result Date: 11/30/2019 CLINICAL DATA:  Pacemaker placement. EXAM: CHEST - 2 VIEW COMPARISON:  11/28/2019 FINDINGS: A LEFT-sided pacemaker is identified with tips overlying the RIGHT atrium and RIGHT ventricle. Cardiomegaly and mild pulmonary vascular congestion identified. There is no evidence of focal airspace disease, pulmonary edema, suspicious pulmonary nodule/mass, pleural effusion, or pneumothorax. No acute bony abnormalities are identified. IMPRESSION: LEFT-sided pacemaker placement. No evidence of pneumothorax. Electronically Signed   By: Margarette Canada M.D.   On: 11/30/2019 09:28   DG Chest 2 View  Result Date: 11/28/2019 CLINICAL DATA:  86 year old female with chest pain. EXAM:  CHEST - 2 VIEW COMPARISON:  Chest radiograph dated 11/06/2019. FINDINGS: No focal consolidation, pleural effusion, pneumothorax. COPD changes and mild diffuse chronic interstitial coarsening. The cardiac silhouette is within normal limits. Atherosclerotic calcification of the aorta. No acute osseous pathology. IMPRESSION: 1. No acute cardiopulmonary process. 2. Emphysema. Electronically Signed   By: Anner Crete M.D.   On: 11/28/2019 19:20   DG Chest 2 View  Result Date: 11/06/2019 CLINICAL DATA:  Chest pain EXAM: CHEST - 2 VIEW COMPARISON:  07/29/2015 FINDINGS: Large lung volumes. There is no edema, consolidation, effusion, or pneumothorax. Normal heart size and negative mediastinal contours. IMPRESSION: 1. No evidence of acute disease. 2. Hyperinflation correlating with COPD history. Electronically Signed   By: Monte Fantasia M.D.   On: 11/06/2019 05:29   CT Head Wo Contrast  Result Date: 11/21/2019 CLINICAL DATA:  Persistent dizziness. EXAM: CT HEAD WITHOUT CONTRAST TECHNIQUE: Contiguous axial images were obtained from the base of the skull through the vertex without intravenous contrast. COMPARISON:  CT scan dated 11/06/2019 FINDINGS: Brain: No evidence of acute infarction, hemorrhage, hydrocephalus, extra-axial collection or mass lesion/mass effect. Mild atrophy. Chronic periventricular white matter lucency consistent with small vessel ischemic changes, stable. Vascular: No hyperdense vessel or unexpected calcification. Skull: Normal. Negative for fracture or focal lesion. Sinuses/Orbits: Normal. Other: None IMPRESSION: No acute abnormalities. Chronic small vessel ischemic changes. Electronically Signed   By: Lorriane Shire M.D.   On: 11/21/2019 10:39   CT Head Wo Contrast  Result Date: 11/06/2019 CLINICAL DATA:  Dizziness, headache. EXAM: CT HEAD WITHOUT CONTRAST TECHNIQUE: Contiguous axial images were obtained from the base of the skull through the vertex without intravenous contrast.  COMPARISON:  Brain MRI 12/26/2009, report from head CT 05/22/1998 (images unavailable) FINDINGS: Brain: No evidence of acute intracranial hemorrhage. No demarcated cortical infarction. No evidence of intracranial mass. No midline shift or extra-axial fluid collection. Moderate ill-defined hypoattenuation within the cerebral white matter which is most prominent in the left frontal lobe, nonspecific but consistent with chronic small vessel ischemic disease. Mild generalized parenchymal atrophy. Vascular: No hyperdense vessel. Skull: Normal. Negative for fracture or focal lesion. Sinuses/Orbits: Visualized orbits demonstrate no acute abnormality. No significant paranasal sinus disease or mastoid effusion at the imaged levels. IMPRESSION: No CT evidence of acute intracranial abnormality. Moderate chronic small vessel ischemic disease. Mild generalized parenchymal atrophy. Electronically Signed   By: Kellie Simmering DO   On: 11/06/2019 08:02   MR BRAIN WO CONTRAST  Result  Date: 11/21/2019 CLINICAL DATA:  Ataxia, stroke suspected. Additional history provided: Dizziness for 3-4 days, history of atrial fibrillation EXAM: MRI HEAD WITHOUT CONTRAST TECHNIQUE: Multiplanar, multiecho pulse sequences of the brain and surrounding structures were obtained without intravenous contrast. COMPARISON:  Head CT 11/21/2019, brain MRI 12/26/2009 FINDINGS: Brain: There is no evidence of acute infarct. No evidence of intracranial mass. No midline shift or extra-axial fluid collection. No chronic intracranial blood products. Moderate patchy T2/FLAIR hyperintensity within the cerebral white matter has progressed from MRI 12/26/2009. Findings are nonspecific, but consistent with chronic small vessel ischemic disease. Moderate generalized parenchymal atrophy. Vascular: Flow voids maintained within the proximal large arterial vessels. Skull and upper cervical spine: No focal marrow lesion. Sinuses/Orbits: Visualized orbits demonstrate no acute  abnormality. Trace ethmoid sinus mucosal thickening. No significant mastoid effusion. IMPRESSION: No evidence of acute infarct or other acute intracranial abnormality. Moderate chronic small vessel ischemic disease, progressed from MRI 12/26/2009. Moderate generalized parenchymal atrophy. Electronically Signed   By: Kellie Simmering DO   On: 11/21/2019 12:30   CARDIAC CATHETERIZATION  Result Date: 11/29/2019 Successful temporary transvenous pacemaker via right jugular vein  EP PPM/ICD IMPLANT  Result Date: 11/29/2019 SURGEON:  Allegra Lai, MD   PREPROCEDURE DIAGNOSIS:  Tachy/brady syndrome   POSTPROCEDURE DIAGNOSIS:  Tachy/brady syndrome    PROCEDURES:  1. Pacemaker implantation.   INTRODUCTION:  Denise [name] is a 86 y.o. female with a history of bradycardia who presents today for pacemaker implantation.  The patient reports intermittent episodes of dizziness over the past few months.  No reversible causes have been identified.  The patient therefore presents today for pacemaker implantation.   DESCRIPTION OF PROCEDURE:  Informed written consent was obtained, and  the patient was brought to the electrophysiology lab in a fasting state.  The patient required no sedation for the procedure today.  The patients left chest was prepped and draped in the usual sterile fashion by the EP lab staff. The skin overlying the left deltopectoral region was infiltrated with lidocaine for local analgesia.  A 4-cm incision was made over the left deltopectoral region.  A left subcutaneous pacemaker pocket was fashioned using a combination of sharp and blunt dissection. Electrocautery was required to assure hemostasis.  RA/RV Lead Placement: The left axillary vein was therefore cannulated.  Through the left axillary vein, a [employer] model Tendril MRI [id number] (serial number  [id number]) right atrial lead and a St Jude Medical model Tendril MRI [id number] (serial number  [id number]) right ventricular lead were advanced with  fluoroscopic visualization into the right atrial appendage and right ventricular apex positions respectively.  Initial atrial lead P- waves measured 2.8 mV with impedance of 451 ohms and a threshold of 1.2 V at 0.5 msec.  Right ventricular lead R-waves measured 11.7 mV with an impedance of 831 ohms and a threshold of 1.3 V at 0.5 msec.  Both leads were secured to the pectoralis fascia using #2-0 silk over the suture sleeves. Device Placement:  The leads were then connected to a [place] MRI  model [id number] (serial number  [id number] ) pacemaker.  The pocket was irrigated with copious gentamicin solution.  The pacemaker was then placed into the pocket.  The pocket was then closed in 3 layers with 2.0 Vicryl suture for the 3.0 Vicryl suture subcutaneous and subcuticular layers.  Steri-  Strips and a sterile dressing were then applied. EBL<36ml.  There were no early apparent complications.   CONCLUSIONS:  1. Successful implantation of a  St Jude Medical Assurity MRI dual-chamber pacemaker for symptomatic bradycardia  2. No early apparent complications.       Denise Brandner Curt Bears, MD 11/29/2019 5:16 PM  ECHOCARDIOGRAM COMPLETE  Result Date: 11/29/2019   ECHOCARDIOGRAM REPORT   Patient Name:   Denise [name] Date of Exam: 11/29/2019 Medical Rec #:  [id number]      Height:       66.0 in Accession #:    [id number]     Weight:       168.0 lb Date of Birth:  1930/05/29     BSA:          1.86 m Patient Age:    56 years       BP:           153/59 mmHg Patient Gender: F              HR:           57 bpm. Exam Location:  Inpatient Procedure: 2D Echo, Color Doppler and Cardiac Doppler STAT ECHO Indications:    I48.91* Unspecified atrial fibrillation  History:        Patient has no prior history of Echocardiogram examinations.                 COPD, Arrythmias:Atrial Fibrillation; Risk Factors:Hypertension,                 Diabetes and Dyslipidemia.  Sonographer:    Raquel Sarna Senior RDCS Referring Phys: [id number] [place]  1. Left ventricular ejection fraction, by visual estimation, is 60 to 65%. The left ventricle has normal function. There is mildly increased left ventricular hypertrophy.  2. Left ventricular diastolic parameters are indeterminate.  3. Global right ventricle has normal systolic function.The right ventricular size is normal.  4. Left atrial size was mildly dilated.  5. Right atrial size was moderately dilated.  6. Moderate mitral annular calcification.  7. The mitral valve is normal in structure. Mild mitral valve regurgitation.  8. The tricuspid valve is normal in structure. Tricuspid valve regurgitation is mild.  9. The aortic valve is tricuspid. Aortic valve regurgitation is not visualized. Mild aortic valve sclerosis without stenosis. 10. The pulmonic valve was not well visualized. Pulmonic valve regurgitation is mild. 11. The tricuspid regurgitant velocity is 2.62 m/s, and with an assumed right atrial pressure of 15 mmHg, the estimated right ventricular systolic pressure is moderately elevated at 42.5 mmHg. 12. The inferior vena cava is dilated in size with <50% respiratory variability, suggesting right atrial pressure of 15 mmHg. FINDINGS  Left Ventricle: Left ventricular ejection fraction, by visual estimation, is 60 to 65%. The left ventricle has normal function. The left ventricle has no regional wall motion abnormalities. There is mildly increased left ventricular hypertrophy. Left ventricular diastolic parameters are indeterminate. Right Ventricle: The right ventricular size is normal. No increase in right ventricular wall thickness. Global RV systolic function is has normal systolic function. The tricuspid regurgitant velocity is 2.62 m/s, and with an assumed right atrial pressure  of 15 mmHg, the estimated right ventricular systolic pressure is moderately elevated at 42.5 mmHg. Left Atrium: Left atrial size was mildly dilated. Right Atrium: Right atrial size was moderately dilated  Pericardium: Trivial pericardial effusion is present. Mitral Valve: The mitral valve is normal in structure. Moderate mitral annular calcification. Mild mitral valve regurgitation. Tricuspid Valve: The tricuspid valve is normal in structure. Tricuspid valve regurgitation is mild. Aortic Valve: The aortic valve is tricuspid. Aortic valve  regurgitation is not visualized. Mild aortic valve sclerosis is present, with no evidence of aortic valve stenosis. Pulmonic Valve: The pulmonic valve was not well visualized. Pulmonic valve regurgitation is mild. Pulmonic regurgitation is mild. Aorta: The aortic root and ascending aorta are structurally normal, with no evidence of dilitation. Venous: The inferior vena cava is dilated in size with less than 50% respiratory variability, suggesting right atrial pressure of 15 mmHg. IAS/Shunts: The interatrial septum was not well visualized.  LEFT VENTRICLE PLAX 2D LVIDd:         5.20 cm  Diastology LVIDs:         3.10 cm  LV e' lateral:   10.10 cm/s LV PW:         0.90 cm  LV E/e' lateral: 8.6 LV IVS:        1.00 cm  LV e' medial:    6.74 cm/s LVOT diam:     2.00 cm  LV E/e' medial:  13.0 LV SV:         92 ml LV SV Index:   48.20 LVOT Area:     3.14 cm  RIGHT VENTRICLE RV S prime:     10.80 cm/s TAPSE (M-mode): 2.6 cm LEFT ATRIUM             Index       RIGHT ATRIUM           Index LA diam:        4.30 cm 2.32 cm/m  RA Area:     24.30 cm LA Vol (A2C):   60.5 ml 32.58 ml/m RA Volume:   74.10 ml  39.90 ml/m LA Vol (A4C):   69.0 ml 37.16 ml/m LA Biplane Vol: 66.9 ml 36.03 ml/m  AORTIC VALVE LVOT Vmax:   104.00 cm/s LVOT Vmean:  58.400 cm/s LVOT VTI:    0.261 m  AORTA Ao Root diam: 3.00 cm Ao Asc diam:  2.60 cm MITRAL VALVE                        TRICUSPID VALVE MV Area (PHT): 5.27 cm             TR Peak grad:   27.5 mmHg MV PHT:        41.76 msec           TR Vmax:        262.00 cm/s MV Decel Time: 144 msec MV E velocity: 87.30 cm/s 103 cm/s  SHUNTS MV A velocity: 38.60 cm/s 70.3  cm/s Systemic VTI:  0.26 m MV E/A ratio:  2.26       1.5       Systemic Diam: 2.00 cm  Denise Milian MD Electronically signed by Denise Milian MD Signature Date/Time: 11/29/2019/11:38:02 AM    Final    Disposition   Pt is being discharged home today in good condition.  Follow-up Plans & Appointments    Follow-up Information    [facility] Follow up.   Specialty: Cardiology Why: 12/12/2019 @ 3:00PM, wound check visit Contact information: [address] [place]       Constance Haw, MD Follow up.   Specialty: Cardiology Why: 03/17/2020 @ 2:15PM Contact information: [place] [address] [phone]          Discharge Instructions    Call MD for:  difficulty breathing, headache or visual disturbances  Complete by: As directed    Call MD for:  extreme fatigue   Complete by: As directed    Call MD for:  hives   Complete by: As directed    Call MD for:  persistant dizziness or light-headedness   Complete by: As directed    Call MD for:  persistant nausea and vomiting   Complete by: As directed    Call MD for:  redness, tenderness, or signs of infection (pain, swelling, redness, odor or green/yellow discharge around incision site)   Complete by: As directed    Call MD for:  severe uncontrolled pain   Complete by: As directed    Call MD for:  temperature >100.4   Complete by: As directed    Diet - low sodium heart healthy   Complete by: As directed    Discharge instructions   Complete by: As directed    Supplemental Discharge Instructions for  Pacemaker/Defibrillator Patients  Activity Do not raise your left/right arm above shoulder level or extend it backward beyond shoulder level for 2 weeks. Wear the arm sling as a reminder or as needed for comfort for 2 weeks. No heavy lifting or vigorous activity with your left/right arm for 6-8 weeks.    NO  DRIVING is preferable for 2 weeks; If absolutely necessary, drive only short, familiar routes. DO wear your seatbelt, even if it crosses over the pacemaker site.  WOUND CARE Keep the wound area clean and dry.  Remove the dressing the day after you return home (usually 48 hours after the procedure). DO NOT SUBMERGE UNDER WATER UNTIL FULLY HEALED (no tub baths, hot tubs, swimming pools, etc.).  You  may shower or take a sponge bath after the dressing is removed. DO NOT SOAK the area and do not allow the shower to directly spray on the site. If you have staples, these Bilaal Leib be removed in the office in 7-14 days. If you have tape/steri-strips on your wound, these Alan Riles fall off; do not pull them off prematurely.   No bandage is needed on the site.  DO  NOT apply any creams, oils, or ointments to the wound area. If you notice any drainage or discharge from the wound, any swelling, excessive redness or bruising at the site, or if you develop a fever > 101? F after you are discharged home, call the office at once.  Special Instructions You are still able to use cellular telephones.  Avoid carrying your cellular phone near your device. When traveling through airports, show security personnel your identification card to avoid being screened in the metal detectors.  Avoid arc welding equipment, MRI testing (magnetic resonance imaging), TENS units (transcutaneous nerve stimulators).  Call the office for questions about other devices. Avoid electrical appliances that are in poor condition or are not properly grounded. Microwave ovens are safe to be near or to operate.  If you notice any bleeding such as blood in stool, black tarry stools, blood in urine, nosebleeds or any other unusual bleeding, call your doctor immediately. It is not normal to have this kind of bleeding while on a blood thinner and usually indicates there is an underlying problem with one of your body systems that needs to be checked out.    Increase activity slowly   Complete by: As directed       Discharge Medications   Allergies as of 11/30/2019      Reactions   Iodinated Diagnostic Agents Anaphylaxis, Other (See Comments), Rash  Benzocaine-chloroxylenol-hc    Years ago   Benzonatate Rash   Note: pruritis      Medication List    STOP taking these medications   heparin 5000 UNIT/ML injection   ondansetron 4 MG/2ML Soln injection Commonly known as: ZOFRAN   sodium chloride flush 0.9 % Soln Commonly known as: NS     TAKE these medications   acetaminophen 325 MG tablet Commonly known as: TYLENOL Take 1-2 tablets (325-650 mg total) by mouth every 4 (four) hours as needed for mild pain (temp > 101.5). What changed: Another medication with the same name was removed. Continue taking this medication, and follow the directions you see here.   allopurinol 100 MG tablet Commonly known as: ZYLOPRIM Take 100 mg by mouth every evening.   apixaban 2.5 MG Tabs tablet Commonly known as: ELIQUIS Take 1 tablet (2.5 mg total) by mouth 2 (two) times daily.   ascorbic acid 250 MG tablet Commonly known as: VITAMIN C Take 250 mg by mouth daily after lunch.   atorvastatin 40 MG tablet Commonly known as: LIPITOR Take 40 mg by mouth every morning.   furosemide 40 MG tablet Commonly known as: LASIX Take 40 mg by mouth every morning.   hydrocortisone cream 1 % Apply 1 application topically 2 (two) times daily. MIX WITH NYSTATIN CREAM   levothyroxine 100 MCG tablet Commonly known as: SYNTHROID Take 100 mcg by mouth daily before breakfast.   metoprolol succinate 100 MG 24 hr tablet Commonly known as: TOPROL-XL Take 1 tablet (100 mg total) by mouth daily. Take with or immediately following a meal. What changed:   medication strength  how much to take  when to take this  additional instructions   nystatin cream Commonly known as: MYCOSTATIN Apply 1 application topically 2 (two) times daily. MIX WITH  HYDROCORTISONE   pantoprazole 40 MG tablet Commonly known as: PROTONIX Take 40 mg by mouth every morning.   pneumococcal 23 valent vaccine 25 MCG/0.5ML injection Commonly known as: PNEUMOVAX-23 Inject 0.5 mLs into the muscle tomorrow at 10 am for 1 dose.   Spiriva HandiHaler 18 MCG inhalation capsule Generic drug: tiotropium Place 1 capsule into inhaler and inhale daily after lunch.   Systane Nighttime Oint Place 1 application into both eyes at bedtime as needed for dry eyes.   Tyler Aas FlexTouch 100 UNIT/ML Sopn FlexTouch Pen Generic drug: insulin degludec Inject 24.5 Units into the skin every evening.   valsartan 80 MG tablet Commonly known as: DIOVAN Take 80 mg by mouth every morning.        Outstanding Labs/Studies   None   Duration of Discharge Encounter   Greater than 30 minutes including physician time.  Signed, Kathyrn Drown, NP 11/30/2019, 9:42 AM    I have seen and examined this patient with Kathyrn Drown.  Agree with above, note added to reflect my findings.  On exam, RRR, no murmurs, lungs clear. Admitted with tachy/brady and AF. S/p [employer] pacemaker. Tonji Elliff discharge today on eliquis and increased metoprolol for AF and device care in clinic.    Naliya Gish M. Lemmie Steinhaus MD 11/30/2019 11:05 AM

## 2019-11-30 NOTE — Progress Notes (Signed)
Patient received new IV this evening. Patient reeducated on the importance of keeping this IV site intact.  Patient received PRN hydralazine for elevated blood pressure of 174/50 per order parameters. Patient refused insulin injection for CBG of 367. Patient states that it will make her "crazier" and that she has already taken her 24 units of insulin today. Told patient that she has only received 3 units today per hospital eMAR. Educated patient on the importance of controlling blood sugars within a normal range and the effects of hyperglycemia. Patient still adamantly refuses the insulin.  MD notified of this refusal.  Tele-sitter in place to assist in monitoring the patient. Will continue to check on the patient frequently and allow her to rest.

## 2019-11-30 NOTE — Discharge Instructions (Signed)
Supplemental Discharge Instructions for  Pacemaker Patients  Activity No heavy lifting or vigorous activity with your left arm for 6 to 8 weeks.  Do not raise your left arm above your head for one week.  Gradually raise your affected arm as drawn below.             12/03/2019                   12/04/2019                12/05/2019               12/06/2019 __  NO DRIVING until your wound check visit  WOUND CARE - Keep the wound area clean and dry.  Do not get this area wet, no showers until cleared to at your wound check visit - The tape/steri-strips on your wound will fall off; do not pull them off.  No bandage is needed on the site.  DO  NOT apply any creams, oils, or ointments to the wound area. - If you notice any drainage or discharge from the wound, any swelling or bruising at the site, or you develop a fever > 101? F after you are discharged home, call the office at once.  Special Instructions - You are still able to use cellular telephones; use the ear opposite the side where you have your pacemaker/defibrillator.  Avoid carrying your cellular phone near your device. - When traveling through airports, show security personnel your identification card to avoid being screened in the metal detectors.  Ask the security personnel to use the hand wand. - Avoid arc welding equipment, MRI testing (magnetic resonance imaging), TENS units (transcutaneous nerve stimulators).  Call the office for questions about other devices. - Avoid electrical appliances that are in poor condition or are not properly grounded. - Microwave ovens are safe to be near or to operate.  Information on my medicine - ELIQUIS (apixaban)  This medication education was reviewed with me or my healthcare representative as part of my discharge preparation.  The pharmacist that spoke with me during my hospital stay was:  Ronna Polio, [facility]  Why was Eliquis prescribed for you? Eliquis was prescribed for you to reduce  the risk of a blood clot forming that can cause a stroke if you have a medical condition called atrial fibrillation (a type of irregular heartbeat).  What do You need to know about Eliquis ? Take your Eliquis TWICE DAILY - one tablet in the morning and one tablet in the evening with or without food. If you have difficulty swallowing the tablet whole please discuss with your pharmacist how to take the medication safely.  Take Eliquis exactly as prescribed by your doctor and DO NOT stop taking Eliquis without talking to the doctor who prescribed the medication.  Stopping may increase your risk of developing a stroke.  Refill your prescription before you run out.  After discharge, you should have regular check-up appointments with your healthcare provider that is prescribing your Eliquis.  In the future your dose may need to be changed if your kidney function or weight changes by a significant amount or as you get older.  What do you do if you miss a dose? If you miss a dose, take it as soon as you remember on the same day and resume taking twice daily.  Do not take more than one dose of ELIQUIS at the same time to make up a  missed dose.  Important Safety Information A possible side effect of Eliquis is bleeding. You should call your healthcare provider right away if you experience any of the following: ? Bleeding from an injury or your nose that does not stop. ? Unusual colored urine (red or dark brown) or unusual colored stools (red or black). ? Unusual bruising for unknown reasons. ? A serious fall or if you hit your head (even if there is no bleeding).  Some medicines may interact with Eliquis and might increase your risk of bleeding or clotting while on Eliquis. To help avoid this, consult your healthcare provider or pharmacist prior to using any new prescription or non-prescription medications, including herbals, vitamins, non-steroidal anti-inflammatory drugs (NSAIDs) and  supplements.  This website has more information on Eliquis (apixaban): http://www.eliquis.com/eliquis/home

## 2019-12-03 MED FILL — Lidocaine HCl Local Inj 1%: INTRAMUSCULAR | Qty: 60 | Status: AC

## 2019-12-03 MED FILL — Gentamicin Sulfate Inj 40 MG/ML: INTRAMUSCULAR | Qty: 80 | Status: AC

## 2019-12-11 ENCOUNTER — Telehealth: Payer: Self-pay | Admitting: *Deleted

## 2019-12-11 NOTE — Telephone Encounter (Signed)
Attempted to reach patient to reschedule wound check. No answer, unable to LM.  LMOVM for [name] requesting that patient call DC. Direct number provided.  Will attempt to reschedule wound check appointment.

## 2019-12-11 NOTE — Telephone Encounter (Signed)
Wound check rescheduled to 12/18/19.

## 2019-12-12 ENCOUNTER — Ambulatory Visit: Payer: Medicare Other

## 2019-12-18 ENCOUNTER — Other Ambulatory Visit: Payer: Self-pay

## 2019-12-18 ENCOUNTER — Ambulatory Visit (INDEPENDENT_AMBULATORY_CARE_PROVIDER_SITE_OTHER): Payer: Medicare Other | Admitting: *Deleted

## 2019-12-18 VITALS — BP 156/70 | HR 60

## 2019-12-18 DIAGNOSIS — I4891 Unspecified atrial fibrillation: Secondary | ICD-10-CM | POA: Diagnosis not present

## 2019-12-18 DIAGNOSIS — Z950 Presence of cardiac pacemaker: Secondary | ICD-10-CM | POA: Diagnosis not present

## 2019-12-18 DIAGNOSIS — I455 Other specified heart block: Secondary | ICD-10-CM

## 2019-12-18 LAB — CUP PACEART INCLINIC DEVICE CHECK
Battery Remaining Longevity: 76 mo
Battery Voltage: 3.04 V
Brady Statistic RA Percent Paced: 81 %
Brady Statistic RV Percent Paced: 0.49 %
Date Time Interrogation Session: 20210224132111
Implantable Lead Implant Date: 20210205
Implantable Lead Implant Date: 20210205
Implantable Lead Location: 753859
Implantable Lead Location: 753860
Implantable Pulse Generator Implant Date: 20210205
Lead Channel Impedance Value: 425 Ohm
Lead Channel Impedance Value: 512.5 Ohm
Lead Channel Pacing Threshold Amplitude: 0.5 V
Lead Channel Pacing Threshold Amplitude: 0.75 V
Lead Channel Pacing Threshold Pulse Width: 0.5 ms
Lead Channel Pacing Threshold Pulse Width: 0.5 ms
Lead Channel Sensing Intrinsic Amplitude: 1.4 mV
Lead Channel Sensing Intrinsic Amplitude: 12 mV
Lead Channel Setting Pacing Amplitude: 3.5 V
Lead Channel Setting Pacing Amplitude: 3.5 V
Lead Channel Setting Pacing Pulse Width: 0.5 ms
Lead Channel Setting Sensing Sensitivity: 2 mV
Pulse Gen Model: 2272
Pulse Gen Serial Number: 9199360

## 2019-12-18 NOTE — Patient Instructions (Addendum)
Please confirm that you are taking metoprolol succinate (Toprol XL) 100 MG (1 tablet) daily. Call the [facility] at [phone] with an update. Raquel Sarna, RN, will discuss your symptoms after we clarify this medication.  Plug your home monitor in next to your bed or wherever you sleep (within 56ft). Your monitor is designed to transmit automatically to our office every 3 months, and in the event of any alert conditions.

## 2019-12-18 NOTE — Progress Notes (Addendum)
Wound check appointment. Steri-strips removed. Wound without redness or edema. Incision edges approximated, wound well healed. Normal device function. Thresholds, sensing, and impedances consistent with implant measurements. Device programmed at 3.5V for extra safety margin until 3 month visit. Histogram distribution appropriate for patient and level of activity. 4.7% AT/AF burden, longest 5.5hrs, on Eliquis and metoprolol. V rates during AT/AF elevated at times. No high ventricular rates noted. Patient educated about wound care, arm mobility, lifting restrictions, and Merlin monitor. New monitor paired today, patient and daughter instructed to plug in at bedside. Merlin on 03/02/20 and ROV with Dr. Curt Bears on 03/17/20.  Patient reports episodes of chest pain, abdominal discomfort, and SOB, all associated with "pounding" and rapid heartbeats. Episodes have occurred a few times since hospital discharge. She is unsure how long symptoms last. She does not have a way to check HR or BP at home. Reports most recent episode occurred last night, no associated AF episodes noted yesterday on device interrogation. She reports that last night she called the fire department and they came by to check on her. She ultimately refused transport to the hospital. She feels much better today and denies SOB, chest discomfort, dizziness, or other symptoms during our visit. BP in clinic 156/70, confirmed medication compliance with patient's daughter. Advised encounter will be forwarded to Dr. Curt Bears for review and recommendations. Patient and daughter are in agreement with plan.

## 2019-12-19 ENCOUNTER — Telehealth: Payer: Self-pay

## 2019-12-19 NOTE — Telephone Encounter (Signed)
LMOVM for [name] requesting call back to DC. Attempted to reach patient at home number. No answer, no option to LM.  Transmission from 12/18/19 at 20:25 shows normal PPM function. Presenting rhythm AS/VS at 62bpm. AP 82%, VP 0% since wound check appointment earlier in the day. 13% overall AT/AF burden since implant, on Eliquis, no new episodes since interrogation during wound check.  [name] returned call. Received verbal permission from patient to speak with [name]. [name] reports patient has been reporting pain at the [facility] [employer] site since this morning. She reports that she thinks it may be related to anxiety. Pt reports pain is constant, unable to describe it in detail, reports it is just at the [facility] site. Denies increased pain with palpation or movement. Denies other chest pain, no other associated symptoms, including SOB, palpitations, or dizziness. Pt called EMS for chest discomfort on 2/23 per daughter. EMS assessed pt and reportedly told her that her heart "looked fine," so pt declined transport to ED.  [name] looked at [facility] site, denies drainage, redness, or swelling. PPM site appeared well healed at wound check on 12/18/19. Patient has been "shaking" on and off for about a week, denies fever or chills. [name] feels it may be related to low blood sugar. CBG 70 this AM, rechecked while on the phone and it was 156. Hasn't been eating well because no food tastes good. [name] thinks pt is urinating normally. Scheduled for f/u with Dr. Humphrey Rolls on 12/25/19. Unable to check BP at home.  Reviewed additional manual transmission while on the phone. Presenting rhythm AP/VS 60bpm. No episodes.  Reassured about normal PPM function. Encouraged pt's daughter to try Tylenol for discomfort at [facility] site and monitor for signs/symptoms of infection. Plan to call back if discomfort does not improve. Pt reports that it has improved during our time on the phone without any intervention.  Advised will also route to Dr. Curt Bears for review and  recommendations. Pt's daughter agrees to contact PCP for recommendations regarding shaking and blood sugars. ED precautions reviewed for new or worsening symptoms overnight. Pt's daughter appreciative of assistance and denies additional questions or concerns at this time.

## 2019-12-19 NOTE — Telephone Encounter (Signed)
LMOVM for the pt to send a transmission with her home monitor. I also left my direct office number for the pt to call back.

## 2019-12-19 NOTE — Telephone Encounter (Signed)
Pt daughter states the pt had a wound check yesterday and now she is having a lot of pain on her ppm site. The pain is going down her breast. The pt sent a transmission last night. The pt daughter states she took all her medication and did not take anything for the pain. I told her I will have a nurse give her a call back. The pt daughter thinks she has been having some anxiety.

## 2019-12-20 NOTE — Telephone Encounter (Signed)
No additional recommendations. Continue with watchful waiting and PCP follow up. Tylenol for discomfort.

## 2019-12-22 ENCOUNTER — Emergency Department
Admission: EM | Admit: 2019-12-22 | Discharge: 2019-12-22 | Disposition: A | Discharge status: Home / Self Care | Payer: Medicare Other | Attending: Emergency Medicine | Admitting: Emergency Medicine

## 2019-12-22 ENCOUNTER — Emergency Department: Payer: Medicare Other

## 2019-12-22 ENCOUNTER — Other Ambulatory Visit: Payer: Self-pay

## 2019-12-22 DIAGNOSIS — Z992 Dependence on renal dialysis: Secondary | ICD-10-CM | POA: Insufficient documentation

## 2019-12-22 DIAGNOSIS — E1122 Type 2 diabetes mellitus with diabetic chronic kidney disease: Secondary | ICD-10-CM | POA: Diagnosis not present

## 2019-12-22 DIAGNOSIS — Z96641 Presence of right artificial hip joint: Secondary | ICD-10-CM | POA: Diagnosis not present

## 2019-12-22 DIAGNOSIS — Z87891 Personal history of nicotine dependence: Secondary | ICD-10-CM | POA: Insufficient documentation

## 2019-12-22 DIAGNOSIS — N186 End stage renal disease: Secondary | ICD-10-CM | POA: Diagnosis not present

## 2019-12-22 DIAGNOSIS — E039 Hypothyroidism, unspecified: Secondary | ICD-10-CM | POA: Insufficient documentation

## 2019-12-22 DIAGNOSIS — R0789 Other chest pain: Secondary | ICD-10-CM | POA: Diagnosis not present

## 2019-12-22 DIAGNOSIS — Z79899 Other long term (current) drug therapy: Secondary | ICD-10-CM | POA: Diagnosis not present

## 2019-12-22 DIAGNOSIS — I509 Heart failure, unspecified: Secondary | ICD-10-CM

## 2019-12-22 DIAGNOSIS — F418 Other specified anxiety disorders: Secondary | ICD-10-CM | POA: Insufficient documentation

## 2019-12-22 DIAGNOSIS — Z7901 Long term (current) use of anticoagulants: Secondary | ICD-10-CM | POA: Insufficient documentation

## 2019-12-22 DIAGNOSIS — I132 Hypertensive heart and chronic kidney disease with heart failure and with stage 5 chronic kidney disease, or end stage renal disease: Secondary | ICD-10-CM | POA: Insufficient documentation

## 2019-12-22 DIAGNOSIS — Z950 Presence of cardiac pacemaker: Secondary | ICD-10-CM | POA: Insufficient documentation

## 2019-12-22 DIAGNOSIS — R079 Chest pain, unspecified: Secondary | ICD-10-CM | POA: Diagnosis present

## 2019-12-22 DIAGNOSIS — R4589 Other symptoms and signs involving emotional state: Secondary | ICD-10-CM

## 2019-12-22 LAB — CBC WITH DIFFERENTIAL/PLATELET
Abs Immature Granulocytes: 0.03 10*3/uL (ref 0.00–0.07)
Basophils Absolute: 0 10*3/uL (ref 0.0–0.1)
Basophils Relative: 1 %
Eosinophils Absolute: 0.1 10*3/uL (ref 0.0–0.5)
Eosinophils Relative: 2 %
HCT: 30.8 % — ABNORMAL LOW (ref 36.0–46.0)
Hemoglobin: 9.7 g/dL — ABNORMAL LOW (ref 12.0–15.0)
Immature Granulocytes: 1 %
Lymphocytes Relative: 32 %
Lymphs Abs: 1.7 10*3/uL (ref 0.7–4.0)
MCH: 29 pg (ref 26.0–34.0)
MCHC: 31.5 g/dL (ref 30.0–36.0)
MCV: 92.2 fL (ref 80.0–100.0)
Monocytes Absolute: 0.6 10*3/uL (ref 0.1–1.0)
Monocytes Relative: 12 %
Neutro Abs: 2.8 10*3/uL (ref 1.7–7.7)
Neutrophils Relative %: 52 %
Platelets: 195 10*3/uL (ref 150–400)
RBC: 3.34 MIL/uL — ABNORMAL LOW (ref 3.87–5.11)
RDW: 14.1 % (ref 11.5–15.5)
WBC: 5.4 10*3/uL (ref 4.0–10.5)
nRBC: 0 % (ref 0.0–0.2)

## 2019-12-22 LAB — TROPONIN I (HIGH SENSITIVITY)
Troponin I (High Sensitivity): 14 ng/L (ref <18)
Troponin I (High Sensitivity): 16 ng/L (ref <18)

## 2019-12-22 LAB — COMPREHENSIVE METABOLIC PANEL
ALT: 11 U/L (ref 0–44)
AST: 20 U/L (ref 15–41)
Albumin: 3.4 g/dL — ABNORMAL LOW (ref 3.5–5.0)
Alkaline Phosphatase: 137 U/L — ABNORMAL HIGH (ref 38–126)
Anion gap: 9 (ref 5–15)
BUN: 29 mg/dL — ABNORMAL HIGH (ref 8–23)
CO2: 29 mmol/L (ref 22–32)
Calcium: 8.5 mg/dL — ABNORMAL LOW (ref 8.9–10.3)
Chloride: 108 mmol/L (ref 98–111)
Creatinine, Ser: 2.16 mg/dL — ABNORMAL HIGH (ref 0.44–1.00)
GFR calc Af Amer: 23 mL/min — ABNORMAL LOW (ref >60)
GFR calc non Af Amer: 20 mL/min — ABNORMAL LOW (ref >60)
Glucose, Bld: 130 mg/dL — ABNORMAL HIGH (ref 70–99)
Potassium: 4 mmol/L (ref 3.5–5.1)
Sodium: 146 mmol/L — ABNORMAL HIGH (ref 135–145)
Total Bilirubin: 0.5 mg/dL (ref 0.3–1.2)
Total Protein: 6.1 g/dL — ABNORMAL LOW (ref 6.5–8.1)

## 2019-12-22 MED ORDER — FUROSEMIDE 40 MG PO TABS
40.0000 mg | ORAL_TABLET | Freq: Once | ORAL | Status: AC
  Administered 2019-12-22: 40 mg via ORAL
  Filled 2019-12-22: qty 1

## 2019-12-22 MED ORDER — LORAZEPAM 0.5 MG PO TABS: 0.5000 mg | ORAL_TABLET | Freq: Two times a day (BID) | ORAL | 0 refills | Status: AC

## 2019-12-22 NOTE — ED Triage Notes (Signed)
Per EMS, pt had pacemaker placed 3 weeks ago and is c/o site pain that increased today. Pt states pain is burning and tender to palpation. Pt denies SOB, dizziness, nausea.  Pt aox4, nad noted. Pacemaker incision noted on left chest, does not appear red, swollen, no discharge noted.

## 2019-12-22 NOTE — ED Provider Notes (Signed)
[facility] Emergency Department Provider Note  ____________________________________________  Time seen: Approximately 8:46 PM  I have reviewed the triage vital signs and the nursing notes.   HISTORY  Chief Complaint Chest Pain    HPI Denise [name] is a 86 y.o. female who presents the emergency department for evaluation of ongoing chest pain.  Patient was seen in this department, had a rapid A. fib with RVR.  Patient had been given medication which corrected A. fib, but then patient had sinus pauses on her rhythm strip.  Patient was eventually transferred to [facility] for pacemaker placement.  Since then she has noticed pain along her chest wall from pacemaker placement.  She states that her initial follow-up was canceled due to weather, then her secondary follow-up the cardiologist was out of the office for an emergency.  Patient was concerned as she has been experiencing chest wall pain at the site of her pacemaker placement since surgery.  She denied any substernal chest pain.  No shortness of breath.   Patient denies any fevers or chills, URI symptoms, abdominal pain, nausea vomiting or diarrhea.          Past Medical History:  Diagnosis Date  . A-fib ([place])   . Arthritis   . Cataract   . Chronic kidney disease   . COPD (chronic obstructive pulmonary disease) ([place])   . Diabetes mellitus without complication ([place])   . Emphysema of lung ([place])   . GERD (gastroesophageal reflux disease)   . Hypertension   . Hypothyroidism     Patient Active Problem List   Diagnosis Date Noted  . Sinus pause 11/30/2019  . PAF (paroxysmal atrial fibrillation) ([place]) 11/30/2019  . Atrial fibrillation with RVR ([place]) 11/28/2019  . ESRD on dialysis ([place]) 04/11/2017  . Hyperlipidemia 01/05/2017  . Diabetes mellitus type 2 with complications ([place]) [id number]  . Chronic kidney disease 01/05/2017  . Intertrochanteric fracture of right hip ([place]) 08/03/2015  . Hypoglycemia  secondary to sulfonylurea 07/29/2015    Past Surgical History:  Procedure Laterality Date  . AV FISTULA PLACEMENT Right 11/18/2015   Procedure: ARTERIOVENOUS (AV) FISTULA CREATION;  Surgeon: Algernon Huxley, MD;  Location: [facility] ORS;  Service: Vascular;  Laterality: Right;  . CHOLECYSTECTOMY    . GALLBLADDER SURGERY    . HIP ARTHROPLASTY Right 07/31/2015   Procedure: ARTHROPLASTY BIPOLAR HIP (HEMIARTHROPLASTY);  Surgeon: Corky Mull, MD;  Location: [facility] ORS;  Service: Orthopedics;  Laterality: Right;  . PACEMAKER IMPLANT N/A 11/29/2019   Procedure: PACEMAKER IMPLANT;  Surgeon: Constance Haw, MD;  Location: [place] CV LAB;  Service: Cardiovascular;  Laterality: N/A;  . TEMPORARY PACEMAKER N/A 11/29/2019   Procedure: TEMPORARY PACEMAKER;  Surgeon: Isaias Cowman, MD;  Location: [place] CV LAB;  Service: Cardiovascular;  Laterality: N/A;    Prior to Admission medications   Medication Sig Start Date End Date Taking? Authorizing Provider  acetaminophen (TYLENOL) 325 MG tablet Take 1-2 tablets (325-650 mg total) by mouth every 4 (four) hours as needed for mild pain (temp > 101.5). 11/29/19   Awilda Bill, NP  allopurinol (ZYLOPRIM) 100 MG tablet Take 100 mg by mouth every evening. 11/20/18   [provider]  apixaban (ELIQUIS) 2.5 MG TABS tablet Take 1 tablet (2.5 mg total) by mouth 2 (two) times daily. 11/30/19   Kathyrn Drown D, NP  ascorbic acid (VITAMIN C) 250 MG tablet Take 250 mg by mouth daily after lunch.    [provider]  atorvastatin (LIPITOR)  40 MG tablet Take 40 mg by mouth every morning. 09/08/14   [provider]  furosemide (LASIX) 40 MG tablet Take 40 mg by mouth every morning. 05/11/16   [provider]  hydrocortisone cream 1 % Apply 1 application topically 2 (two) times daily. [place] CREAM    [provider]  insulin degludec (TRESIBA FLEXTOUCH) 100 UNIT/ML SOPN FlexTouch Pen Inject 24.5 Units into the skin  every evening. 11/16/18   [provider]  levothyroxine (SYNTHROID) 100 MCG tablet Take 100 mcg by mouth daily before breakfast.  07/16/19   [provider]  LORazepam (ATIVAN) 0.5 MG tablet Take 1 tablet (0.5 mg total) by mouth 2 (two) times daily. 12/22/19 12/21/20  Cuthriell, Charline Bills, PA-C  metoprolol succinate (TOPROL-XL) 100 MG 24 hr tablet Take 1 tablet (100 mg total) by mouth daily. Take with or immediately following a meal. 11/30/19   Kathyrn Drown D, NP  nystatin cream (MYCOSTATIN) Apply 1 application topically 2 (two) times daily. [place] WITH HYDROCORTISONE 08/26/19   [provider]  pantoprazole (PROTONIX) 40 MG tablet Take 40 mg by mouth every morning. 11/30/15   [provider]  tiotropium (SPIRIVA HANDIHALER) 18 MCG inhalation capsule Place 1 capsule into inhaler and inhale daily after lunch. 01/22/10   [provider]  valsartan (DIOVAN) 80 MG tablet Take 80 mg by mouth every morning. 08/20/14   [provider]  White Petrolatum-Mineral Oil (SYSTANE NIGHTTIME) OINT Place 1 application into both eyes at bedtime as needed for dry eyes. 06/10/19   [provider]    Allergies Iodinated diagnostic agents, Benzocaine-chloroxylenol-hc, and Benzonatate  Family History  Problem Relation Age of Onset  . Diabetes Mother   . Heart attack Mother   . Stomach cancer Father   . Colon cancer Sister   . Diabetes Sister   . Heart attack Brother   . Colon cancer Brother   . Breast cancer Neg Hx     Social History Social History   Tobacco Use  . Smoking status: Former Smoker    Quit date: 07/28/2013    Years since quitting: 6.4  . Smokeless tobacco: Never Used  Substance Use Topics  . Alcohol use: No  . Drug use: No     Review of Systems  Constitutional: No fever/chills Eyes: No visual changes. No discharge ENT: No upper respiratory complaints. Cardiovascular: no substernal chest pain. Respiratory: no cough. No  SOB. Gastrointestinal: No abdominal pain.  No nausea, no vomiting.  No diarrhea.  No constipation. Genitourinary: Negative for dysuria. No hematuria Musculoskeletal: Positive for chest wall pain at the site of pacemaker placement Skin: Negative for rash, abrasions, lacerations, ecchymosis. Neurological: Negative for headaches, focal weakness or numbness. 10-point ROS otherwise negative.  ____________________________________________   PHYSICAL EXAM:  VITAL SIGNS: ED Triage Vitals  Enc Vitals Group     BP 12/22/19 1730 (!) 166/43     Pulse Rate 12/22/19 1730 61     Resp 12/22/19 1730 20     Temp 12/22/19 1730 98.5 F (36.9 C)     Temp Source 12/22/19 1730 Oral     SpO2 12/22/19 1730 98 %     Weight 12/22/19 1727 170 lb (77.1 kg)     Height 12/22/19 1727 5\' 6"  (1.676 m)     Head Circumference --      Peak Flow --      Pain Score 12/22/19 1727 8     Pain Loc --  Pain Edu? --      Excl. in [place]? --      Constitutional: Alert and oriented. Well appearing and in no acute distress. Eyes: Conjunctivae are normal. PERRL. EOMI. Head: Atraumatic. ENT:      Ears:       Nose: No congestion/rhinnorhea.      Mouth/Throat: Mucous membranes are moist.  Neck: No stridor.  Neck is supple full range of motion Hematological/Lymphatic/Immunilogical: No cervical lymphadenopathy. Cardiovascular: Normal rate, regular rhythm. Normal S1 and S2.  Good peripheral circulation. Respiratory: Normal respiratory effort without tachypnea or retractions. Lungs CTAB. Good air entry to the bases with no decreased or absent breath sounds. Gastrointestinal: Bowel sounds 4 quadrants. Soft and nontender to palpation. No guarding or rigidity. No palpable masses. No distention. No CVA tenderness. Musculoskeletal: Full range of motion to all extremities. No gross deformities appreciated.  Examination of the chest wall reveals surgical site healing well with no erythema or edema.  No tenderness to palpation.  No  purulent drainage expressed on palpation. Neurologic:  Normal speech and language. No gross focal neurologic deficits are appreciated.  Skin:  Skin is warm, dry and intact. No rash noted. Psychiatric: Mood and affect are normal. Speech and behavior are normal. Patient exhibits appropriate insight and judgement.   ____________________________________________   LABS (all labs ordered are listed, but only abnormal results are displayed)  Labs Reviewed  COMPREHENSIVE METABOLIC PANEL - Abnormal; Notable for the following components:      Result Value   Sodium 146 (*)    Glucose, Bld 130 (*)    BUN 29 (*)    Creatinine, Ser 2.16 (*)    Calcium 8.5 (*)    Total Protein 6.1 (*)    Albumin 3.4 (*)    Alkaline Phosphatase 137 (*)    GFR calc non Af Amer 20 (*)    GFR calc Af Amer 23 (*)    All other components within normal limits  CBC WITH DIFFERENTIAL/PLATELET - Abnormal; Notable for the following components:   RBC 3.34 (*)    Hemoglobin 9.7 (*)    HCT 30.8 (*)    All other components within normal limits  TROPONIN I (HIGH SENSITIVITY)  TROPONIN I (HIGH SENSITIVITY)   ____________________________________________  EKG   ____________________________________________  RADIOLOGY I personally viewed and evaluated these images as part of my medical decision making, as well as reviewing the written report by the radiologist.  DG Chest 1 View  Result Date: 12/22/2019 CLINICAL DATA:  Chest pain. EXAM: CHEST  1 VIEW COMPARISON:  11/30/2019 FINDINGS: There is a dual chamber left-sided pacemaker in place. The heart size is enlarged. Prominent interstitial lung markings are noted bilaterally. There is a new small left-sided pleural effusion. There may be a trace right-sided pleural effusion. There is no pneumothorax. There is no acute osseous abnormality. IMPRESSION: 1. Cardiomegaly with findings suspicious for congestive heart failure. An underlying atypical infectious process is not  excluded. 2. New small bilateral pleural effusions, left greater than right. 3. Stable positioning Electronically Signed   By: Constance Holster M.D.   On: 12/22/2019 19:46    ____________________________________________    PROCEDURES  Procedure(s) performed:    Procedures    Medications  furosemide (LASIX) tablet 40 mg (40 mg Oral Given 12/22/19 2104)     ____________________________________________   INITIAL IMPRESSION / ASSESSMENT AND PLAN / ED COURSE  Pertinent labs & imaging results that were available during my care of the patient were reviewed by me and  considered in my medical decision making (see chart for details).  Review of the [place] CSRS was performed in accordance of the [place] prior to dispensing any controlled drugs.           Patient's diagnosis is consistent with chest wall pain, CHF, exam about health.  Patient presents emergency department concerned about her surgical site from pacemaker placement.  Patient had not been able to follow-up with cardiology following this placement.  Patient had no other complaints on time of arrival.  Work-up revealed small amount of effusion consistent with CHF.  Patient is given an extra dose of Lasix here in the emergency department.  I discussed the case with the patient's daughter with the patient's permission.  Per the daughter, the patient has had increasing anxiety about her health.  She is requesting an as needed medication because the patient will work herself up to the point of "shaking."  Daughter understands postsurgical pain.  At this time no change in patient's chronic medications.  At the daughter's request I will prescribe a few Ativan to help when patient becomes exceptionally worked up.  Daughter will hold onto prescription and only administer when absolutely needed.  Follow-up with cardiology. Patient is given ED precautions to return to the ED for any worsening or new  symptoms.     ____________________________________________  FINAL CLINICAL IMPRESSION(S) / ED DIAGNOSES  Final diagnoses:  Chest wall pain  Acute on chronic congestive heart failure, unspecified heart failure type ([place])  Anxiety about health      NEW MEDICATIONS STARTED DURING THIS VISIT:  ED Discharge Orders         Ordered    LORazepam (ATIVAN) 0.5 MG tablet  2 times daily     12/22/19 2107              This chart was dictated using voice recognition software/Dragon. Despite best efforts to proofread, errors can occur which can change the meaning. Any change was purely unintentional.    Brynda Peon 12/22/19 2111    Lavonia Drafts, MD 12/22/19 2243

## 2019-12-23 NOTE — Telephone Encounter (Signed)
LMOVM for [name] advising of Dr. Macky Lower recommendations. Direct number provided for any additional questions/concerns.

## 2019-12-23 NOTE — Telephone Encounter (Signed)
Patient presented to ED for reported symptoms on 12/22/19 per notes in Epic.

## 2020-01-09 ENCOUNTER — Emergency Department
Admission: EM | Admit: 2020-01-09 | Discharge: 2020-01-10 | Disposition: A | Discharge status: Home / Self Care | Payer: Medicare Other | Attending: Emergency Medicine | Admitting: Emergency Medicine

## 2020-01-09 ENCOUNTER — Encounter: Payer: Self-pay | Admitting: Emergency Medicine

## 2020-01-09 ENCOUNTER — Other Ambulatory Visit: Payer: Self-pay

## 2020-01-09 ENCOUNTER — Emergency Department: Payer: Medicare Other

## 2020-01-09 DIAGNOSIS — I480 Paroxysmal atrial fibrillation: Secondary | ICD-10-CM | POA: Insufficient documentation

## 2020-01-09 DIAGNOSIS — J449 Chronic obstructive pulmonary disease, unspecified: Secondary | ICD-10-CM | POA: Diagnosis not present

## 2020-01-09 DIAGNOSIS — I129 Hypertensive chronic kidney disease with stage 1 through stage 4 chronic kidney disease, or unspecified chronic kidney disease: Secondary | ICD-10-CM | POA: Diagnosis not present

## 2020-01-09 DIAGNOSIS — Z7901 Long term (current) use of anticoagulants: Secondary | ICD-10-CM | POA: Insufficient documentation

## 2020-01-09 DIAGNOSIS — Z79899 Other long term (current) drug therapy: Secondary | ICD-10-CM | POA: Diagnosis not present

## 2020-01-09 DIAGNOSIS — E039 Hypothyroidism, unspecified: Secondary | ICD-10-CM | POA: Insufficient documentation

## 2020-01-09 DIAGNOSIS — Z794 Long term (current) use of insulin: Secondary | ICD-10-CM | POA: Insufficient documentation

## 2020-01-09 DIAGNOSIS — E1122 Type 2 diabetes mellitus with diabetic chronic kidney disease: Secondary | ICD-10-CM | POA: Diagnosis not present

## 2020-01-09 DIAGNOSIS — R002 Palpitations: Secondary | ICD-10-CM | POA: Diagnosis present

## 2020-01-09 DIAGNOSIS — N184 Chronic kidney disease, stage 4 (severe): Secondary | ICD-10-CM | POA: Insufficient documentation

## 2020-01-09 DIAGNOSIS — Z992 Dependence on renal dialysis: Secondary | ICD-10-CM | POA: Diagnosis not present

## 2020-01-09 DIAGNOSIS — Z96641 Presence of right artificial hip joint: Secondary | ICD-10-CM | POA: Insufficient documentation

## 2020-01-09 DIAGNOSIS — Z9049 Acquired absence of other specified parts of digestive tract: Secondary | ICD-10-CM | POA: Diagnosis not present

## 2020-01-09 DIAGNOSIS — Z87891 Personal history of nicotine dependence: Secondary | ICD-10-CM | POA: Diagnosis not present

## 2020-01-09 LAB — CBC
HCT: 32.4 % — ABNORMAL LOW (ref 36.0–46.0)
Hemoglobin: 10 g/dL — ABNORMAL LOW (ref 12.0–15.0)
MCH: 28.7 pg (ref 26.0–34.0)
MCHC: 30.9 g/dL (ref 30.0–36.0)
MCV: 93.1 fL (ref 80.0–100.0)
Platelets: 177 10*3/uL (ref 150–400)
RBC: 3.48 MIL/uL — ABNORMAL LOW (ref 3.87–5.11)
RDW: 13.4 % (ref 11.5–15.5)
WBC: 6.4 10*3/uL (ref 4.0–10.5)
nRBC: 0 % (ref 0.0–0.2)

## 2020-01-09 LAB — BASIC METABOLIC PANEL
Anion gap: 9 (ref 5–15)
BUN: 27 mg/dL — ABNORMAL HIGH (ref 8–23)
CO2: 27 mmol/L (ref 22–32)
Calcium: 8.4 mg/dL — ABNORMAL LOW (ref 8.9–10.3)
Chloride: 106 mmol/L (ref 98–111)
Creatinine, Ser: 2.16 mg/dL — ABNORMAL HIGH (ref 0.44–1.00)
GFR calc Af Amer: 23 mL/min — ABNORMAL LOW (ref >60)
GFR calc non Af Amer: 20 mL/min — ABNORMAL LOW (ref >60)
Glucose, Bld: 282 mg/dL — ABNORMAL HIGH (ref 70–99)
Potassium: 3.6 mmol/L (ref 3.5–5.1)
Sodium: 142 mmol/L (ref 135–145)

## 2020-01-09 LAB — MAGNESIUM: Magnesium: 2.1 mg/dL (ref 1.7–2.4)

## 2020-01-09 LAB — TROPONIN I (HIGH SENSITIVITY): Troponin I (High Sensitivity): 13 ng/L (ref <18)

## 2020-01-09 MED ORDER — METOPROLOL TARTRATE 50 MG PO TABS
50.0000 mg | ORAL_TABLET | Freq: Once | ORAL | Status: DC
  Filled 2020-01-09: qty 1

## 2020-01-09 MED ORDER — METOPROLOL TARTRATE 5 MG/5ML IV SOLN
5.0000 mg | Freq: Once | INTRAVENOUS | Status: DC
  Filled 2020-01-09: qty 5

## 2020-01-09 MED ORDER — APIXABAN 2.5 MG PO TABS
2.5000 mg | ORAL_TABLET | Freq: Two times a day (BID) | ORAL | Status: DC
  Administered 2020-01-09: 2.5 mg via ORAL
  Filled 2020-01-09 (×2): qty 1

## 2020-01-09 NOTE — ED Notes (Signed)
Pacemaker interrogated with [employer] device.

## 2020-01-09 NOTE — ED Notes (Signed)
Pt transported to xray at this time

## 2020-01-09 NOTE — ED Notes (Signed)
Pt self converted while this RN in room to give meds.  MD notified and VORB to hold metoprolol at this time but give eliquis.  Will continue to watch patient.

## 2020-01-09 NOTE — ED Provider Notes (Signed)
[facility] Emergency Department Provider Note  ____________________________________________  Time seen: Approximately 11:14 PM  I have reviewed the triage vital signs and the nursing notes.   HISTORY  Chief Complaint Atrial Fibrillation and Palpitations    HPI Denise [name] is a 86 y.o. female with a history of A. fib, COPD, diabetes, hypertension who was in her usual state of health when she got into an argument with her daughter tonight.  It was very heated, and during that time she developed palpitations and chest pain.  This was constant nonradiating no aggravating or alleviating factors, no shortness of breath.  On arrival to the ED, she reports that the pain has resolved but she still has some racing heart rate feeling.  She recently had a pacemaker placed due to A. fib with RVR with prolonged pauses.  Denies fever.  She has been compliant with metoprolol and Eliquis.      Past Medical History:  Diagnosis Date  . A-fib ([place])   . Arthritis   . Cataract   . Chronic kidney disease   . COPD (chronic obstructive pulmonary disease) ([place])   . Diabetes mellitus without complication ([place])   . Emphysema of lung ([place])   . GERD (gastroesophageal reflux disease)   . Hypertension   . Hypothyroidism      Patient Active Problem List   Diagnosis Date Noted  . Sinus pause 11/30/2019  . PAF (paroxysmal atrial fibrillation) ([place]) 11/30/2019  . Atrial fibrillation with RVR ([place]) 11/28/2019  . ESRD on dialysis ([place]) 04/11/2017  . Hyperlipidemia 01/05/2017  . Diabetes mellitus type 2 with complications ([place]) 84/13/2440  . Chronic kidney disease 01/05/2017  . Intertrochanteric fracture of right hip ([place]) 08/03/2015  . Hypoglycemia secondary to sulfonylurea 07/29/2015     Past Surgical History:  Procedure Laterality Date  . AV FISTULA PLACEMENT Right 11/18/2015   Procedure: ARTERIOVENOUS (AV) FISTULA CREATION;  Surgeon: Algernon Huxley, MD;  Location: [facility] ORS;   Service: Vascular;  Laterality: Right;  . CHOLECYSTECTOMY    . GALLBLADDER SURGERY    . HIP ARTHROPLASTY Right 07/31/2015   Procedure: ARTHROPLASTY BIPOLAR HIP (HEMIARTHROPLASTY);  Surgeon: Corky Mull, MD;  Location: [facility] ORS;  Service: Orthopedics;  Laterality: Right;  . PACEMAKER IMPLANT N/A 11/29/2019   Procedure: PACEMAKER IMPLANT;  Surgeon: Constance Haw, MD;  Location: [place] CV LAB;  Service: Cardiovascular;  Laterality: N/A;  . TEMPORARY PACEMAKER N/A 11/29/2019   Procedure: TEMPORARY PACEMAKER;  Surgeon: Isaias Cowman, MD;  Location: [place] CV LAB;  Service: Cardiovascular;  Laterality: N/A;     Prior to Admission medications   Medication Sig Start Date End Date Taking? Authorizing Provider  acetaminophen (TYLENOL) 325 MG tablet Take 1-2 tablets (325-650 mg total) by mouth every 4 (four) hours as needed for mild pain (temp > 101.5). 11/29/19   Awilda Bill, NP  allopurinol (ZYLOPRIM) 100 MG tablet Take 100 mg by mouth every evening. 11/20/18   [provider]  apixaban (ELIQUIS) 2.5 MG TABS tablet Take 1 tablet (2.5 mg total) by mouth 2 (two) times daily. 11/30/19   Kathyrn Drown D, NP  ascorbic acid (VITAMIN C) 250 MG tablet Take 250 mg by mouth daily after lunch.    [provider]  atorvastatin (LIPITOR) 40 MG tablet Take 40 mg by mouth every morning. 09/08/14   [provider]  furosemide (LASIX) 40 MG tablet Take 40 mg by mouth every morning. 05/11/16   [provider]  hydrocortisone  cream 1 % Apply 1 application topically 2 (two) times daily. [place] CREAM    [provider]  insulin degludec (TRESIBA FLEXTOUCH) 100 UNIT/ML SOPN FlexTouch Pen Inject 24.5 Units into the skin every evening. 11/16/18   [provider]  levothyroxine (SYNTHROID) 100 MCG tablet Take 100 mcg by mouth daily before breakfast.  07/16/19   [provider]  LORazepam (ATIVAN) 0.5 MG tablet Take 1 tablet (0.5 mg  total) by mouth 2 (two) times daily. 12/22/19 12/21/20  Cuthriell, Charline Bills, PA-C  metoprolol succinate (TOPROL-XL) 100 MG 24 hr tablet Take 1 tablet (100 mg total) by mouth daily. Take with or immediately following a meal. 11/30/19   Kathyrn Drown D, NP  nystatin cream (MYCOSTATIN) Apply 1 application topically 2 (two) times daily. [place] WITH HYDROCORTISONE 08/26/19   [provider]  pantoprazole (PROTONIX) 40 MG tablet Take 40 mg by mouth every morning. 11/30/15   [provider]  tiotropium (SPIRIVA HANDIHALER) 18 MCG inhalation capsule Place 1 capsule into inhaler and inhale daily after lunch. 01/22/10   [provider]  valsartan (DIOVAN) 80 MG tablet Take 80 mg by mouth every morning. 08/20/14   [provider]  White Petrolatum-Mineral Oil (SYSTANE NIGHTTIME) OINT Place 1 application into both eyes at bedtime as needed for dry eyes. 06/10/19   [provider]     Allergies Iodinated diagnostic agents, Benzocaine-chloroxylenol-hc, and Benzonatate   Family History  Problem Relation Age of Onset  . Diabetes Mother   . Heart attack Mother   . Stomach cancer Father   . Colon cancer Sister   . Diabetes Sister   . Heart attack Brother   . Colon cancer Brother   . Breast cancer Neg Hx     Social History Social History   Tobacco Use  . Smoking status: Former Smoker    Quit date: 07/28/2013    Years since quitting: 6.4  . Smokeless tobacco: Never Used  Substance Use Topics  . Alcohol use: No  . Drug use: No    Review of Systems  Constitutional:   No fever or chills.  ENT:   No sore throat. No rhinorrhea. Cardiovascular: Positive chest pain as above without syncope. Respiratory:   No dyspnea or cough. Gastrointestinal:   Negative for abdominal pain, vomiting and diarrhea.  Musculoskeletal:   Negative for focal pain or swelling All other systems reviewed and are negative except as documented above in ROS and  HPI.  ____________________________________________   PHYSICAL EXAM:  VITAL SIGNS: ED Triage Vitals  Enc Vitals Group     BP 01/09/20 2057 (!) 161/81     Pulse Rate 01/09/20 2057 (!) 117     Resp 01/09/20 2057 18     Temp --      Temp src --      SpO2 01/09/20 2057 100 %     Weight 01/09/20 2056 172 lb (78 kg)     Height 01/09/20 2056 5\' 6"  (1.676 m)     Head Circumference --      Peak Flow --      Pain Score 01/09/20 2055 6     Pain Loc --      Pain Edu? --      Excl. in [place]? --     Vital signs reviewed, nursing assessments reviewed.   Constitutional:   Alert and oriented. Non-toxic appearance. Eyes:   Conjunctivae are normal. EOMI. PERRL. ENT      Head:  Normocephalic and atraumatic.      Nose:   Wearing a mask.      Mouth/Throat:   Wearing a mask.      Neck:   No meningismus. Full ROM. Hematological/Lymphatic/Immunilogical:   No cervical lymphadenopathy. Cardiovascular:   Irregularly irregular rhythm, heart rate 100-1 10. Symmetric bilateral radial and DP pulses.  No murmurs. Cap refill less than 2 seconds. Respiratory:   Normal respiratory effort without tachypnea/retractions. Breath sounds are clear and equal bilaterally. No wheezes/rales/rhonchi. Gastrointestinal:   Soft and nontender. Non distended. There is no CVA tenderness.  No rebound, rigidity, or guarding. Musculoskeletal:   Normal range of motion in all extremities. No joint effusions.  No lower extremity tenderness.  No edema. Neurologic:   Normal speech and language.  Motor grossly intact. No acute focal neurologic deficits are appreciated.  Skin:    Skin is warm, dry and intact. No rash noted.  No petechiae, purpura, or bullae.  ____________________________________________    LABS (pertinent positives/negatives) (all labs ordered are listed, but only abnormal results are displayed) Labs Reviewed  BASIC METABOLIC PANEL - Abnormal; Notable for the following components:      Result Value   Glucose,  Bld 282 (*)    BUN 27 (*)    Creatinine, Ser 2.16 (*)    Calcium 8.4 (*)    GFR calc non Af Amer 20 (*)    GFR calc Af Amer 23 (*)    All other components within normal limits  CBC - Abnormal; Notable for the following components:   RBC 3.48 (*)    Hemoglobin 10.0 (*)    HCT 32.4 (*)    All other components within normal limits  MAGNESIUM  TROPONIN I (HIGH SENSITIVITY)  TROPONIN I (HIGH SENSITIVITY)   ____________________________________________   EKG  Interpreted by me Atrial fibrillation, tachycardia with a rate of 120.  Normal axis and intervals.  Normal QRS ST segments and T waves.  ____________________________________________    [id number]  DG Chest 2 View  Result Date: 01/09/2020 CLINICAL DATA:  Chest pain EXAM: CHEST - 2 VIEW COMPARISON:  12/22/2019 FINDINGS: Left-sided pacing device as before. Cardiomediastinal silhouette is enlarged but stable. There is mild central vascular congestion. Probable trace left effusion. No pneumothorax. IMPRESSION: 1. Cardiomegaly with mild central vascular congestion 2. Suspected trace left pleural effusion Electronically Signed   By: Donavan Foil M.D.   On: 01/09/2020 21:26    ____________________________________________   PROCEDURES Procedures  ____________________________________________    CLINICAL IMPRESSION / ASSESSMENT AND PLAN / ED COURSE  Medications ordered in the ED: Medications  metoprolol tartrate (LOPRESSOR) injection 5 mg (has no administration in time range)  apixaban (ELIQUIS) tablet 2.5 mg (2.5 mg Oral Given 01/09/20 2232)  metoprolol tartrate (LOPRESSOR) tablet 50 mg (has no administration in time range)    Pertinent labs & imaging results that were available during my care of the patient were reviewed by me and considered in my medical decision making (see chart for details).  HENNESSY BARTEL was evaluated in Emergency Department on 01/09/2020 for the symptoms described in the history of present illness.  She was evaluated in the context of the global COVID-19 pandemic, which necessitated consideration that the patient might be at risk for infection with the SARS-CoV-2 virus that causes COVID-19. Institutional protocols and algorithms that pertain to the evaluation of patients at risk for COVID-19 are in a state of rapid change based on information released by regulatory bodies including the CDC and federal and  state organizations. These policies and algorithms were followed during the patient's care in the ED.   Patient presents with nonspecific chest pain, atrial fibrillation with RVR.  She is well-appearing and chest pain resolved prior to arrival in the ED.  Vital signs unremarkable other than the tachycardia.  Initial EKG is nonischemic.  Chest x-ray and initial labs including troponin are unremarkable.  Will check a second troponin, give IV and oral metoprolol for rate control.  Clinical Course as of Jan 09 2343  Thu Jan 09, 2020  2258 Patient spontaneously converted back to a normal sinus rhythm, rate of 60s.  Labs are at baseline.  Will trend troponin, plan to discharge home after to continue follow-up with cardiology and continue home meds.  I ordered her nighttime dose of Eliquis.   [PS]    Clinical Course User Index [PS] Carrie Mew, MD     ____________________________________________   FINAL CLINICAL IMPRESSION(S) / ED DIAGNOSES    Final diagnoses:  Paroxysmal atrial fibrillation ([place])  CKD (chronic kidney disease) stage 4, GFR 15-29 ml/min [facility])     ED Discharge Orders    None      Portions of this note were generated with dragon dictation software. Dictation errors may occur despite best attempts at proofreading.   Carrie Mew, MD 01/09/20 [phone]

## 2020-01-09 NOTE — ED Triage Notes (Signed)
Pt to ED via EMS from home c/o chest pain and palpitations that started today after an argument with her daughter.  States racing feeling was in chest radiating to back and into abd, denies n/v/d, states was having some SOB at the time of racing feeling.  Reports relief at this time upon arrival.  Patient has hx of a.fib, pacemaker placed approx 2 weeks ago.  Pt present A&Ox4, chest rise even and unlabored, in NAD at this time.  Pacemaker brand is [employer] (card in wallet).

## 2020-01-09 NOTE — Discharge Instructions (Signed)
Continue taking all of your home medications as prescribed.  We gave your nighttime dose of Eliquis for today so you do not need to take that tonight.  Your heart rate converted back to a normal rhythm on its own while in the emergency department.  Please follow-up with your cardiologist for continued monitoring of your symptoms.

## 2020-01-10 LAB — TROPONIN I (HIGH SENSITIVITY): Troponin I (High Sensitivity): 15 ng/L (ref <18)

## 2020-01-10 NOTE — ED Notes (Signed)
Pt has received a couple phone calls from daughter but has requested to not give any information or permission to talk to daughter, patient not wanting to talk to daughter either.

## 2020-01-10 NOTE — ED Notes (Signed)
Pt self converted from a.fib to sinus rhythm.  MD notified.  Metoprolol to be held and and monitor patient at this time.

## 2020-01-10 NOTE — ED Provider Notes (Signed)
-----------------------------------------   1:06 AM on 01/10/2020 -----------------------------------------  Repeat troponin stable.  Updated patient.  Heart rate remains NSR rate of 60.  Strict return precautions given.  Patient verbalizes understanding agrees with plan of care.   Paulette Blanch, MD 01/10/20 [id number]

## 2020-01-10 NOTE — ED Notes (Signed)
Pt verbalized understanding of discharge paperwork, has no further questions.  Unable to sign d/t electronic signature pad not connecting multiple times.

## 2020-01-29 ENCOUNTER — Telehealth: Payer: Self-pay | Admitting: *Deleted

## 2020-01-29 NOTE — Telephone Encounter (Signed)
[facility] alert received 01/29/20 at 02:00 for AT/AF burden of 3%. Presenting rhythm AF/VS 80s-120s. Current episode ongoing x2.5 hours. V rates elevated during AMS, >120bpm >40% of the time. Pt was seen at [facility] ED on 01/09/20 for AF w/RVR. On Eliquis 2.5mg  BID and Toprol XL 100mg  daily for rate control. Will discuss symptoms with patient and offer referral to AF Clinic. Next f/u with Dr. Curt Bears is scheduled for 03/17/20.  Attempted to reach patient. No answer, no option to LM. Unable to discuss with anyone else per [facility]. Will try again at a later time.

## 2020-01-31 NOTE — Telephone Encounter (Signed)
Unable to reach patient.  Chanetta Marshall, NP 01/31/2020 4:51 PM

## 2020-02-03 NOTE — Telephone Encounter (Signed)
Increase toprol XL to 100 mg BID for AF with RVR.

## 2020-02-03 NOTE — Telephone Encounter (Signed)
Spoke with patient. She reports that sometimes she does feel more SOB or feels her heart "pouding", but she is not able to say how long symptoms last. She denies palpitations or chest discomfort at this time, though she feels a little SOB today. Pt is unsure if she is in AF right now. No alert transmissions received since 4/7. Reports compliance with Eliquis 2.5mg  BID and Toprol XL 100mg  daily. Pt requests I contact her daughter for assistance with manual transmission. She will not be back home until this evening. Left detailed message on daughter's VM per pt request with transmission steps.   Will await manual transmission. Routed to Dr. Curt Bears for review of available data from 4/7 transmission.

## 2020-02-04 NOTE — Telephone Encounter (Signed)
Spoke with patient to advise of medication change. Pt requests that I contact her daughter with these instructions as her daughter is now helping her with medications. LMOVM requesting call back from [place]. Direct number and office hours provided.

## 2020-02-05 MED ORDER — METOPROLOL SUCCINATE ER 100 MG PO TB24: 100.0000 mg | ORAL_TABLET | Freq: Two times a day (BID) | ORAL | 1 refills | Status: DC

## 2020-02-05 NOTE — Telephone Encounter (Signed)
Pt daughter called she has questions about the medication changed.

## 2020-02-05 NOTE — Telephone Encounter (Signed)
Pt daughter [name] returned phone call.  Reviewed Dr. Curt Bears recommendation to increase Metoprolol to 100mg  BID.

## 2020-02-05 NOTE — Telephone Encounter (Signed)
Attempted to call pt daughter back, no answer, left VM with office hours and direct phone number for her to callback.

## 2020-02-13 ENCOUNTER — Telehealth: Payer: Self-pay

## 2020-02-13 NOTE — Telephone Encounter (Signed)
Pt daughter [name] called stating that her mom complains of her heart beating fast for the past 15 min. I have walked the daughter through the transmission process. Patient does not have any other symptoms associated. Please call daughter [name].

## 2020-02-13 NOTE — Telephone Encounter (Signed)
Called patient to assess. Patient answered the phone and states daughter called and reported patient could feel her heart beating fast. Patient reports that she feels fine, denies chest pain, shortness of breath, dizziness or syncope. Compliant with Lopressor and Eliquis. Patient was seen in ED on 12/22/19 and 01/09/20.  Requested patient to send manual transmission tomorrow 02/14/20 when she is able to Patient request I call daughter and see if she is able to. I called daughter, she agreed. Daughter Clarice Pole) requested more information regarding patients care, informed her I am not able to provide that information d/t information on DPR. Verbalizes understanding.  Patient given verbal instructions that if she becomes symptomatic with sudden onset of chest pain, shortness of breath, syncope, dizziness or any other acute symptom to go to closest emergency department for care. Patient verbalizes understanding.  Will forward to Dr. Curt Bears.   Presenting

## 2020-02-17 NOTE — Telephone Encounter (Signed)
Patient needs follow up in AF clinic to discuss antiarrhythmic vs rate control and dccv.

## 2020-02-19 NOTE — Telephone Encounter (Signed)
Called patient and she requested I call daughter to schedule.  Spoke with daughter, Denise [name], she is agreeable to appt 02/20/20 @1 :30 with [facility]

## 2020-02-19 NOTE — Telephone Encounter (Signed)
Patient's daughter, [name], called back stating that when she called patient tp give pt the appt, patient declined coming this week.  Per daughter, pt stated she is not feeling well and has a cough so she wants to come for appt next wk.  Per daughter's request, rescheduled pt to 02/26/20 @2 :30 pm.

## 2020-02-20 ENCOUNTER — Ambulatory Visit (HOSPITAL_COMMUNITY): Payer: Medicare Other | Admitting: Physician Assistant

## 2020-02-26 ENCOUNTER — Inpatient Hospital Stay (HOSPITAL_COMMUNITY)
Admission: RE | Admit: 2020-02-26 | Discharge: 2020-02-26 | Disposition: A | Discharge status: Home / Self Care | Payer: Medicare Other | Source: Ambulatory Visit | Attending: Physician Assistant | Admitting: Physician Assistant

## 2020-02-26 NOTE — Progress Notes (Incomplete)
? ? ?  Primary Care Physician: Perrin Maltese, MD ?Primary Cardiologist: Dr Humphrey Rolls ?Primary Electrophysiologist: Dr Curt Bears ?Referring Physician: Dr Curt Bears ? ? ?Denise [name] is a 86 y.o. 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 The patient was initially diagnosed with atrial fibrillation 11/2019 after presenting to [facility] with symptoms of palpitations and dizzy spells. She was found in Afib w/RVR 140's treated with dilt initially though developed pauses of at least 5 seconds that continued to happen despite dopamine and calcium given. With escalating BPs of 220/110 stopped the dopa. Dr. Josefa Half was consulted and placed an emergent transvenous temp pacing wire and was transferred to [facility] for further care and management. She underwent PPM implantation by Dr Curt Bears. Patient is on Eliquis for a CHADS2VASC score of 5. Patient was seen at [facility] ED on 01/09/20 for afib with RVR. She converted to SR spontaneously and Dr Curt Bears increased her BB. On 02/13/20, patient called the device clinic reporting palpitations and sent a manual transmission to see if she was in afib. Device clinic confirmed she was in afib with elevated heart rates. *** ? ?Today, she denies symptoms of ***palpitations, chest pain, shortness of breath, orthopnea, PND, lower extremity edema, dizziness, presyncope, syncope, snoring, daytime somnolence, bleeding, or neurologic sequela. The patient is tolerating medications without difficulties and is otherwise without complaint today.  ? ? ?Atrial Fibrillation Risk Factors: ? ?she {Action; does/does not:19097} have symptoms or diagnosis of sleep apnea. ?she {ACTION; IS/IS [id number]} compliant with CPAP therapy. ?she {Action; does/does not:19097} have a history of rheumatic fever. ? she {Action; does/does not:19097} have a history of alcohol use. ?The patient {Actio

## 2020-03-02 ENCOUNTER — Ambulatory Visit (INDEPENDENT_AMBULATORY_CARE_PROVIDER_SITE_OTHER): Payer: Medicare Other | Admitting: *Deleted

## 2020-03-02 ENCOUNTER — Ambulatory Visit (HOSPITAL_COMMUNITY): Payer: Medicare Other | Admitting: Physician Assistant

## 2020-03-02 DIAGNOSIS — I4891 Unspecified atrial fibrillation: Secondary | ICD-10-CM

## 2020-03-02 LAB — CUP PACEART REMOTE DEVICE CHECK
Battery Remaining Longevity: 65 mo
Battery Remaining Percentage: 95.5 %
Battery Voltage: 3.01 V
Brady Statistic AP VP Percent: 1 %
Brady Statistic AP VS Percent: 91 %
Brady Statistic AS VP Percent: 1 %
Brady Statistic AS VS Percent: 7.7 %
Brady Statistic RA Percent Paced: 87 %
Brady Statistic RV Percent Paced: 1 %
Date Time Interrogation Session: 20210510020015
Implantable Lead Implant Date: 20210205
Implantable Lead Implant Date: 20210205
Implantable Lead Location: 753859
Implantable Lead Location: 753860
Implantable Pulse Generator Implant Date: 20210205
Lead Channel Impedance Value: 390 Ohm
Lead Channel Impedance Value: 540 Ohm
Lead Channel Pacing Threshold Amplitude: 0.5 V
Lead Channel Pacing Threshold Amplitude: 0.75 V
Lead Channel Pacing Threshold Pulse Width: 0.5 ms
Lead Channel Pacing Threshold Pulse Width: 0.5 ms
Lead Channel Sensing Intrinsic Amplitude: 1.2 mV
Lead Channel Sensing Intrinsic Amplitude: 12 mV
Lead Channel Setting Pacing Amplitude: 3.5 V
Lead Channel Setting Pacing Amplitude: 3.5 V
Lead Channel Setting Pacing Pulse Width: 0.5 ms
Lead Channel Setting Sensing Sensitivity: 2 mV
Pulse Gen Model: 2272
Pulse Gen Serial Number: 9199360

## 2020-03-03 NOTE — Progress Notes (Signed)
Remote pacemaker transmission.   

## 2020-03-17 ENCOUNTER — Encounter: Payer: Medicare Other | Admitting: Cardiology

## 2020-03-19 ENCOUNTER — Telehealth: Payer: Self-pay | Admitting: *Deleted

## 2020-03-19 MED ORDER — METOPROLOL SUCCINATE ER 100 MG PO TB24: 150.0000 mg | ORAL_TABLET | Freq: Every day | ORAL | 2 refills | Status: DC

## 2020-03-19 NOTE — Telephone Encounter (Signed)
-----   Message from Will Meredith Leeds, MD sent at 03/19/2020 10:07 AM EDT ----- Abnormal device interrogation reviewed.  Lead parameters and battery status stable.  HR fast when in AF. Increase toprol xl to 150 mg.

## 2020-03-19 NOTE — Telephone Encounter (Signed)
Pt agreeable. Advised to call office with any issues after medication increase. Will update medication list.  Patient agreeable to calling me in several weeks to let us know how she is doing on the increased dose.  Will send in Rx at that time if maintaining on new dose. Patient verbalized understanding and agreeable to plan.

## 2020-03-20 NOTE — Telephone Encounter (Signed)
Patient's daughter, [name], is calling to follow up in regards to medication adjustment. She states the patient is requesting clarification. Please call.

## 2020-03-20 NOTE — Telephone Encounter (Signed)
Spoke with pt to obtain permission to speak with daughter.  Pt states this will be fine.  Also reviewed medication change with pt.  Pt verbalized understanding.   Called and spoke with daughter and made her aware of recommendations as well.  Daughter appreciative for call.

## 2020-03-24 NOTE — Telephone Encounter (Signed)
lmtcb

## 2020-03-24 NOTE — Telephone Encounter (Signed)
Patient's daughter [name] called stating she called her mother's pharmacy and they told her they have not received a new script for the change in the medication

## 2020-03-31 ENCOUNTER — Other Ambulatory Visit: Payer: Self-pay | Admitting: Cardiology

## 2020-03-31 NOTE — Telephone Encounter (Signed)
Pt last saw Dr Curt Bears in hospital 11/29/19, has f/u in office on 04/16/20 with Dr Curt Bears, last labs 01/09/20 Creat 2.16, age 86, weight 78kg, based on specified criteria pt is on appropriate dosage of Eliquis 2.5mg  BID.  Will refill rx.

## 2020-04-10 NOTE — Telephone Encounter (Signed)
Pt tells me she is going fine on increase dose. Informed that when she comes in on 6/24 for follow up we will address sending in updated Rx at that time. Patient verbalized understanding and agreeable to plan.

## 2020-04-15 ENCOUNTER — Other Ambulatory Visit: Payer: Self-pay | Admitting: Cardiology

## 2020-04-16 ENCOUNTER — Encounter: Payer: Medicare Other | Admitting: Cardiology

## 2020-05-05 ENCOUNTER — Telehealth: Payer: Self-pay | Admitting: Cardiology

## 2020-05-05 NOTE — Telephone Encounter (Signed)
   Pt's daughter called, she said pt's pacemaker site been hurting since yesterday and would like to speak with a nurse to discuss

## 2020-05-05 NOTE — Telephone Encounter (Signed)
Spoke with pt who verified identity and requested that I speak with her daughter, Denise [name].    Denise [name] stats that pt c/o pain at pacemaker site.  Device was implanted 11/2019 and incision is well healed.  Pt did have issues with similar pain in February, recommendation at that time was tylenol and close monitoring.    Per Denise [name], current pain started last night, improved overnight and then reoccured today.  Pt has taken 2 tylenol with good effect today.  Daughter denies any physical change in appearance around PM site, scar is present.    Assisted with manual transmission which revealed most recent episode was AHR 04/26/20.    Patient is scheduled for in-clinic check with Dr. Curt Bears 05/26/20, this is her 90 day check that has been cancelled and rescheduled by patient multiple times.   Educated to continue treating with tylenol as previously directed, close monitoring if any change in appearance or quality of pain notify MD.

## 2020-05-06 DIAGNOSIS — D631 Anemia in chronic kidney disease: Secondary | ICD-10-CM | POA: Insufficient documentation

## 2020-05-26 ENCOUNTER — Encounter: Payer: Medicare Other | Admitting: Cardiology

## 2020-06-01 ENCOUNTER — Ambulatory Visit (INDEPENDENT_AMBULATORY_CARE_PROVIDER_SITE_OTHER): Payer: Medicare Other | Admitting: *Deleted

## 2020-06-01 ENCOUNTER — Other Ambulatory Visit: Payer: Self-pay | Admitting: Cardiology

## 2020-06-01 DIAGNOSIS — I4891 Unspecified atrial fibrillation: Secondary | ICD-10-CM

## 2020-06-03 LAB — CUP PACEART REMOTE DEVICE CHECK
Battery Remaining Longevity: 69 mo
Battery Remaining Percentage: 95.5 %
Battery Voltage: 2.99 V
Brady Statistic AP VP Percent: 1 %
Brady Statistic AP VS Percent: 93 %
Brady Statistic AS VP Percent: 1 %
Brady Statistic AS VS Percent: 6.2 %
Brady Statistic RA Percent Paced: 90 %
Brady Statistic RV Percent Paced: 1 %
Date Time Interrogation Session: 20210809020015
Implantable Lead Implant Date: 20210205
Implantable Lead Implant Date: 20210205
Implantable Lead Location: 753859
Implantable Lead Location: 753860
Implantable Pulse Generator Implant Date: 20210205
Lead Channel Impedance Value: 460 Ohm
Lead Channel Impedance Value: 580 Ohm
Lead Channel Pacing Threshold Amplitude: 0.5 V
Lead Channel Pacing Threshold Amplitude: 0.75 V
Lead Channel Pacing Threshold Pulse Width: 0.5 ms
Lead Channel Pacing Threshold Pulse Width: 0.5 ms
Lead Channel Sensing Intrinsic Amplitude: 1.7 mV
Lead Channel Sensing Intrinsic Amplitude: 12 mV
Lead Channel Setting Pacing Amplitude: 3.5 V
Lead Channel Setting Pacing Amplitude: 3.5 V
Lead Channel Setting Pacing Pulse Width: 0.5 ms
Lead Channel Setting Sensing Sensitivity: 2 mV
Pulse Gen Model: 2272
Pulse Gen Serial Number: 9199360

## 2020-06-04 ENCOUNTER — Telehealth: Payer: Self-pay | Admitting: Cardiology

## 2020-06-04 ENCOUNTER — Other Ambulatory Visit: Payer: Self-pay | Admitting: Cardiology

## 2020-06-04 NOTE — Telephone Encounter (Signed)
*  STAT* If patient is at the pharmacy, call can be transferred to refill team.   1. Which medications need to be refilled? (please list name of each medication and dose if known) Metoprolol 150 gm   2. Which pharmacy/location (including street and city if local pharmacy) is medication to be sent to? [facility]  3. Do they need a 30 day or 90 day supply? Would like to get 90 instead of 30

## 2020-06-04 NOTE — Telephone Encounter (Signed)
Called pt to inform her that she needed to make an appointment with a cardiologist, because she has never been seen in our office before or she can call her PCP to see if they will refill her medication. I advised the pt that if she needed to make an appt with our office to call back. Pt verbalized understanding.

## 2020-06-04 NOTE — Telephone Encounter (Signed)
*  STAT* If patient is at the pharmacy, call can be transferred to refill team.   1. Which medications need to be refilled? (please list name of each medication and dose if known) metoprolol succinate (TOPROL-XL) 100 MG 24 hr tablet  2. Which pharmacy/location (including street and city if local pharmacy) is medication to be sent to? [place], [place]  3. Do they need a 30 day or 90 day supply? 90 day supply

## 2020-06-04 NOTE — Progress Notes (Signed)
Remote pacemaker transmission.   

## 2020-06-04 NOTE — Telephone Encounter (Signed)
Called pt to inform her that she needed to come in to be seen by a cardiologist or she could refill her medication with her PCP. Pt stated that she would call her PCP to see if they can refill her medication for her. I advised the pt that if she needed to call our office back, to do so and make an appt to be seen. Pt verbalized understanding.

## 2020-06-24 ENCOUNTER — Other Ambulatory Visit: Payer: Self-pay | Admitting: Cardiology

## 2020-06-25 NOTE — Telephone Encounter (Addendum)
Pt has not been by a cardiologist to date. Therefore, I am unable to refill this prescription. Pt had an appt with Clint Fenton PA and Dr. Curt Bears and all appts were canceled. Also, on 06/04/2020, Cathy in [place] called pt about another refill and she made pt aware that she needs to make an appt or have PCP to refill and per conversation it states the pt will have PCP refill.  Called the pt but there was no answer and voicemail states full. Will try back later.  Called the pt back and asked to speak with the pt and person on the phone identified herself as the pt's dtr and states the pt has dementia and she cannot make her mom go to any appts when she doesn't fell like it.  She states that her mom will just wake up and decides not to go. Then she states she was called earlier but missed the call. Advised that I don't have her on DPR and she stated the pt contact number is hers and the pt is upstairs and not coming back down. Advised that we need to know if she is taking her meds and who would be able to refill a request we have  since she has not been in to se Dr. Curt Bears or the [facility]. She stated Dr. Lamonte Sakai and that she goes to that doctor and will not travel to [place]. Advised that she may need to see if she can see a Film/video editor in [place]. She stated that the med needs to go to Dr. Humphrey Rolls. Thanked her for the info and that I would have the pharmacy to send.

## 2020-07-13 ENCOUNTER — Telehealth: Payer: Self-pay | Admitting: Cardiology

## 2020-07-13 NOTE — Telephone Encounter (Signed)
Patient contacted. [name] ( daughter ) answered contact # for patient but is not listed on DPR. Patient gave permission to speak to [facility]. Patient reports intermittent pain at ppm site in left chest, no pain at this time. Patient and [name] report no edema, discoloration,drainage, or temperature change at site of device. Patient has no fever or chills. [name] reports patient has dementia and has concerns because she reports the "pacemaker was pulsating" in the patient's chest on Friday night and Saturday morning. Assisted with manual transmission send. Transmission reviewed: device function WNL, EGMs for high atrial rates show AT/AF with controlled v-rates. [name] reports no ability to send photo of ppm site and is unable to bring her mother for appointments due to Debra's fractured arm and she has no other family or friends that can assist with transportation. No CP, SOB, dizziness, or syncope during time ppm site was reported to be "pulsating". No missed doses of metoprolol or Eliquis per [name]. ED precautions given for CP, SOB, dizziness, and  or syncope. Patient has missied multiple appointments for 91 day follow up after ppm implant 12/15/19 due to transportation issues.

## 2020-07-13 NOTE — Telephone Encounter (Signed)
Patient notified DPR form will be sent via mail to add Denise [name] to. Debra notified that medicare may provide transportation to  appointments and to contact customer service on the back of the patient's insurance card to discuss options for transport to appointments.

## 2020-07-13 NOTE — Telephone Encounter (Signed)
New message    1. Has your device fired? no  2. Is you device beeping? No    3. Are you experiencing draining or swelling at device site? no  4. Are you calling to see if we received your device transmission? No   5. Have you passed out? No  Denise [name] called in stated that pt felt pulsing in her chest this weekend and would like to speak with nurse .  She stated it did stop and is not currently pulsing but it did most part of the weekend.  She stated it started Friday night.    Best number  [phone]    Please route to [place]

## 2020-08-31 ENCOUNTER — Ambulatory Visit (INDEPENDENT_AMBULATORY_CARE_PROVIDER_SITE_OTHER): Payer: Medicare Other

## 2020-08-31 DIAGNOSIS — I4891 Unspecified atrial fibrillation: Secondary | ICD-10-CM | POA: Diagnosis not present

## 2020-09-01 LAB — CUP PACEART REMOTE DEVICE CHECK
Battery Remaining Longevity: 71 mo
Battery Remaining Percentage: 95.5 %
Battery Voltage: 2.99 V
Brady Statistic AP VP Percent: 1 %
Brady Statistic AP VS Percent: 86 %
Brady Statistic AS VP Percent: 1 %
Brady Statistic AS VS Percent: 13 %
Brady Statistic RA Percent Paced: 84 %
Brady Statistic RV Percent Paced: 1 %
Date Time Interrogation Session: 20211108010016
Implantable Lead Implant Date: 20210205
Implantable Lead Implant Date: 20210205
Implantable Lead Location: 753859
Implantable Lead Location: 753860
Implantable Pulse Generator Implant Date: 20210205
Lead Channel Impedance Value: 490 Ohm
Lead Channel Impedance Value: 550 Ohm
Lead Channel Pacing Threshold Amplitude: 0.5 V
Lead Channel Pacing Threshold Amplitude: 0.75 V
Lead Channel Pacing Threshold Pulse Width: 0.5 ms
Lead Channel Pacing Threshold Pulse Width: 0.5 ms
Lead Channel Sensing Intrinsic Amplitude: 1.4 mV
Lead Channel Sensing Intrinsic Amplitude: 12 mV
Lead Channel Setting Pacing Amplitude: 3.5 V
Lead Channel Setting Pacing Amplitude: 3.5 V
Lead Channel Setting Pacing Pulse Width: 0.5 ms
Lead Channel Setting Sensing Sensitivity: 2 mV
Pulse Gen Model: 2272
Pulse Gen Serial Number: 9199360

## 2020-09-02 NOTE — Progress Notes (Signed)
Remote pacemaker transmission.   

## 2020-09-23 ENCOUNTER — Other Ambulatory Visit: Payer: Self-pay | Admitting: Cardiology

## 2020-09-23 NOTE — Telephone Encounter (Signed)
Prescription refill request for Eliquis received. Indication: Afib Last office visit: Last seen in the hospital by [facility], 11/2019 Scr: 2.16, 01/09/2020 Age: 86 yo Weight: 172 lbs  Pt is overdue for follow up appointments. Reached out out to the EP scheduler to see if pt could get scheduled for follow up appointment.

## 2020-09-25 ENCOUNTER — Telehealth: Payer: Self-pay | Admitting: Cardiology

## 2020-09-25 NOTE — Telephone Encounter (Signed)
Called and spoke to pt's daughter. Transferred her to the mainline to schedule an appointment.

## 2020-09-25 NOTE — Telephone Encounter (Signed)
Prescription refill request for Eliquis received. Indication:afib Last office visit: has apt for 11/24/19 Scr:2.16 Age: 86 Weight:78kg  Refill sent in different encounter

## 2020-09-25 NOTE — Telephone Encounter (Signed)
*  STAT* If patient is at the pharmacy, call can be transferred to refill team.   1. Which medications need to be refilled? (please list name of each medication and dose if known)  ELIQUIS 2.5 MG TABS tablet  2. Which pharmacy/location (including street and city if local pharmacy) is medication to be sent to? [place], [place]  3. Do they need a 30 day or 90 day supply?  90 day supply   Patient has appt set with [place] on 11/23/20

## 2020-11-20 ENCOUNTER — Telehealth: Payer: Self-pay | Admitting: Nurse Practitioner

## 2020-11-20 NOTE — Telephone Encounter (Signed)
Called patient's home # [phone]) to offer to schedule an In-home Palliative Consult, no answer - left message with reason for call along with my name and call back number.  I also called patient's cell [phone]) with no answer and unable to leave message due to mailbox was full.  I called patient's daughter, Clarice Pole, [place] and the line was busy.

## 2020-11-23 ENCOUNTER — Encounter: Payer: Medicare Other | Admitting: Cardiology

## 2020-11-23 ENCOUNTER — Telehealth: Payer: Self-pay | Admitting: Nurse Practitioner

## 2020-11-23 NOTE — Telephone Encounter (Signed)
Called patient's home # to schedule a Palliative visit, no answer - left another message requesting a return call to schedule a visit.  I called pt's cell, no answer and unable to leave a message.  I called daughter's number and it is still ringing busy.

## 2020-11-30 ENCOUNTER — Ambulatory Visit (INDEPENDENT_AMBULATORY_CARE_PROVIDER_SITE_OTHER): Payer: Medicare Other

## 2020-11-30 DIAGNOSIS — I4891 Unspecified atrial fibrillation: Secondary | ICD-10-CM

## 2020-12-02 LAB — CUP PACEART REMOTE DEVICE CHECK
Battery Remaining Longevity: 72 mo
Battery Remaining Percentage: 95.5 %
Battery Voltage: 3.01 V
Brady Statistic AP VP Percent: 1 %
Brady Statistic AP VS Percent: 81 %
Brady Statistic AS VP Percent: 1 %
Brady Statistic AS VS Percent: 18 %
Brady Statistic RA Percent Paced: 79 %
Brady Statistic RV Percent Paced: 1 %
Date Time Interrogation Session: 20220207033327
Implantable Lead Implant Date: 20210205
Implantable Lead Implant Date: 20210205
Implantable Lead Location: 753859
Implantable Lead Location: 753860
Implantable Pulse Generator Implant Date: 20210205
Lead Channel Impedance Value: 430 Ohm
Lead Channel Impedance Value: 490 Ohm
Lead Channel Pacing Threshold Amplitude: 0.5 V
Lead Channel Pacing Threshold Amplitude: 0.75 V
Lead Channel Pacing Threshold Pulse Width: 0.5 ms
Lead Channel Pacing Threshold Pulse Width: 0.5 ms
Lead Channel Sensing Intrinsic Amplitude: 12 mV
Lead Channel Sensing Intrinsic Amplitude: 2.5 mV
Lead Channel Setting Pacing Amplitude: 3.5 V
Lead Channel Setting Pacing Amplitude: 3.5 V
Lead Channel Setting Pacing Pulse Width: 0.5 ms
Lead Channel Setting Sensing Sensitivity: 2 mV
Pulse Gen Model: 2272
Pulse Gen Serial Number: 9199360

## 2020-12-07 ENCOUNTER — Telehealth: Payer: Self-pay | Admitting: Nurse Practitioner

## 2020-12-07 NOTE — Telephone Encounter (Signed)
Called daughter's number to schedule visit, it was busy.  I also called patient's home number, with no answer - left another VM to return my call to schedule a visit with our NP.    I then called Stacie Acres, NP who sent [place] the referral and told him that I have been unable to reach the patient or the daughter to schedule visit.  He said he was driving and would call me back.  Rec'd call back from Stacie Acres and he gave me the number for the patient (which is what we have in the computer) and he said he just tried to call her before calling me and she didn't answer.  Thayer Jew said that he has an appointment with her tomorrow at 1 PM and that he would call me back tomorrow to give me an update

## 2020-12-07 NOTE — Progress Notes (Signed)
Remote pacemaker transmission.   

## 2020-12-11 ENCOUNTER — Telehealth: Payer: Self-pay | Admitting: Adult Health Nurse Practitioner

## 2020-12-11 NOTE — Telephone Encounter (Signed)
I was able to speak with daughter, Clarice Pole, and after a lengthy discussion and all questions were answered I have scheduled an In-home Consult for 12/29/20 @ 2:30 PM.    Also contacted Stacie Acres, NP from [place] to let him know that I spoke with the daughter and was able to schedule a visit with her and the patient.

## 2020-12-29 ENCOUNTER — Other Ambulatory Visit: Payer: Self-pay | Admitting: Cardiology

## 2020-12-29 ENCOUNTER — Other Ambulatory Visit: Payer: Medicare Other | Admitting: Adult Health Nurse Practitioner

## 2020-12-29 ENCOUNTER — Other Ambulatory Visit: Payer: Self-pay

## 2020-12-29 DIAGNOSIS — N9489 Other specified conditions associated with female genital organs and menstrual cycle: Secondary | ICD-10-CM

## 2020-12-29 DIAGNOSIS — R4189 Other symptoms and signs involving cognitive functions and awareness: Secondary | ICD-10-CM

## 2020-12-29 DIAGNOSIS — I480 Paroxysmal atrial fibrillation: Secondary | ICD-10-CM

## 2020-12-29 DIAGNOSIS — Z515 Encounter for palliative care: Secondary | ICD-10-CM

## 2020-12-29 DIAGNOSIS — M159 Polyosteoarthritis, unspecified: Secondary | ICD-10-CM

## 2020-12-29 DIAGNOSIS — N949 Unspecified condition associated with female genital organs and menstrual cycle: Secondary | ICD-10-CM

## 2020-12-29 NOTE — Telephone Encounter (Signed)
Prescription refill request for Eliquis received. Indication: PAF Last office visit: 01/09/20 Scr:  2.16 on 01/09/20 Age: 87  Weight: 80.2kg  Based on above findings Eliquis 2.'5mg'$  twice daily is the appropriate dose.  Refill approved x 1.  Pallative care consult being done today.

## 2020-12-29 NOTE — Progress Notes (Signed)
[facility] Consult Note Telephone: [phone]  Fax: [phone]  PATIENT NAME: Denise [name] DOB: 09-09-1930 MRN: [id number]  PRIMARY CARE PROVIDER:   Perrin Maltese, MD  REFERRING PROVIDER:  Stacie Acres NP, [place]  RESPONSIBLE PARTY:   Clarice Pole, daughter [phone]  Chief complaint: initial palliative visit for complex medical decision making  Daughter present at visit today  RECOMMENDATIONS and PLAN:  1.  Advanced care planning.  Patient is full code.  Started discussion on ACP and left blank MOST form for patient and daughter to discuss together.  Will discuss further at next visit.  2. A-fib.  Patient in on Eliquis and has pacemaker checked regularly. Continue follow up and recommendations by cardiology  3.  Cognitive impairment.  Patient not diagnosed with dementia but seems to be having some cognitive impairment as witnessed by her daughter.  Patient has cancelled appointments for CT scan to evaluate for for dementia.  Will monitor for any safety issues that require for placement or consistent care in the home  4.  Vaginal burning.  Have encouraged her stop the cream she is using as it does not seem effective and since we are unsure what it is.  Have encouraged patient to schedule appointment with PCP for further evaluation.  She expressed calling to schedule appointment  5.  OA.  Patient takes Tylenol arthritis.  Have encouraged increasing activity.  6.  Support.  Patient is requiring more help in the home. Discussed with patient and daughter reaching out to DSS to see if eligible for Medicaid.  If eligible for Medicaid could apply for PCS services.    Palliative will continue to monitor for symptom management/decline and make recommendations as needed.  Follow up in 6 weeks.  Encouraged to call with any questions or concerns.  I spent 75 minutes providing this consultation, including time spent with patient/family,  provider coordination, chart review including latest labs and imaging, documentation. More than 50% of the time in this consultation was spent coordinating communication.   HISTORY OF PRESENT ILLNESS:  Denise [name] is a 87 y.o. year old female with multiple medical problems including CKD stage 4, HTN, HLD, hyperparathyroidism d/t renal insufficiency, DMT2, renal disorder d/t DM, anemia of chronic disease, a-fib, lumbar spondylosis, sinus node dysfunction, COPD, GERD, hypothyroidism. Palliative Care was asked to help address goals of care. Patient lives at [facility].  Her daughter lives in separate apartment in the same building.  contributed to history today.  Patient states that she has some pain in her right ear.  Daughter states that has history of BPPV.  Patient denies vertigo or dizziness today.  Patient has arthritis in multiple joints and gets some relief with Tylenol arthritis.  She is not very active.  She reports a couple of falls recently with no injury.  She uses a cane when ambulating in her apartment and a wheelchair when going out to doctor appointments.  She requires assistance with bathing but otherwise independent of ADLs.  She is continent of [place].  She requires assistance with IADLs.  Daughter helps with cleaning when she can and helps her with her medications.  She receives MOWs.  Daughter states that she has noticed more forgetfulness in her mother and her mother is starting to have increased agitation in the evenings that she feels may be sundowning.  She also states that her mother talks about people looking into her apartment through the front door peephole  and she has but tape over it.  Patient states having burning with urination and even without urination.  Denies hematuria.  States this has been going on for a long time.  She has been putting a cream on it but unable to find the cream today.  Patient states having a BM daily but they can be hard.  Denies melena or  hematochezia.  Rest of ROS asked and negative.  CODE STATUS: Daughter see above  PPS: 60% HOSPICE ELIGIBILITY/DIAGNOSIS: TBD  PHYSICAL EXAM:  BP 128/74 HR 60 O2 98% on RA General: NAD, frail appearing Eyes: sclera anicteric and noninjected with no discharge noted ENMT: noist mucous membranes Cardiovascular: regular rate and rhythm Pulmonary: lung sounds clear; normal respiratory effort Abdomen: soft, nontender, + bowel sounds Extremities: trace edema, no joint deformities Skin: no rashes on exposed skin Neurological: Weakness but otherwise nonfocal; has forgetfulness   Family History  Problem Relation Age of Onset  . Diabetes Mother  . Heart disease Mother  . Cancer Father  . Cancer Brother  colon  . Heart disease Brother  . Cancer Sibling  colon - brother    PAST MEDICAL HISTORY:  Past Medical History:  Diagnosis Date  . A-fib ([place])   . Arthritis   . Cataract   . Chronic kidney disease   . COPD (chronic obstructive pulmonary disease) ([place])   . Diabetes mellitus without complication ([place])   . Emphysema of lung ([place])   . GERD (gastroesophageal reflux disease)   . Hypertension   . Hypothyroidism     SOCIAL HX:  Social History   Tobacco Use  . Smoking status: Former Smoker    Quit date: 07/28/2013    Years since quitting: 7.4  . Smokeless tobacco: Never Used  Substance Use Topics  . Alcohol use: No    ALLERGIES:  Allergies  Allergen Reactions  . Iodinated Diagnostic Agents Anaphylaxis, Other (See Comments) and Rash  . Benzocaine-Chloroxylenol-Hc     Years ago  . Benzonatate Rash    Note: pruritis     PERTINENT MEDICATIONS:  Outpatient Encounter Medications as of 12/29/2020  Medication Sig  . acetaminophen (TYLENOL) 325 MG tablet Take 1-2 tablets (325-650 mg total) by mouth every 4 (four) hours as needed for mild pain (temp > 101.5).  Marland Kitchen allopurinol (ZYLOPRIM) 100 MG tablet Take 100 mg by mouth every evening.  Marland Kitchen ascorbic acid (VITAMIN C) 250 MG tablet  Take 250 mg by mouth daily after lunch.  Marland Kitchen atorvastatin (LIPITOR) 40 MG tablet Take 40 mg by mouth every morning.  Marland Kitchen ELIQUIS 2.5 MG TABS tablet TAKE 1 TABLET BY MOUTH TWICE DAILY  . furosemide (LASIX) 40 MG tablet Take 40 mg by mouth every morning.  . hydrocortisone cream 1 % Apply 1 application topically 2 (two) times daily. MIX WITH NYSTATIN CREAM  . insulin degludec (TRESIBA FLEXTOUCH) 100 UNIT/ML SOPN FlexTouch Pen Inject 24.5 Units into the skin every evening.  Marland Kitchen levothyroxine (SYNTHROID) 100 MCG tablet Take 100 mcg by mouth daily before breakfast.   . metoprolol succinate (TOPROL-XL) 100 MG 24 hr tablet Take 1.5 tablets (150 mg total) by mouth daily. Take with or immediately following a meal.  . nystatin cream (MYCOSTATIN) Apply 1 application topically 2 (two) times daily. MIX WITH HYDROCORTISONE  . pantoprazole (PROTONIX) 40 MG tablet Take 40 mg by mouth every morning.  . tiotropium (SPIRIVA HANDIHALER) 18 MCG inhalation capsule Place 1 capsule into inhaler and inhale daily after lunch.  . valsartan (DIOVAN) 80 MG tablet  Take 80 mg by mouth every morning.  Dema Severin Petrolatum-Mineral Oil (SYSTANE NIGHTTIME) OINT Place 1 application into both eyes at bedtime as needed for dry eyes.   No facility-administered encounter medications on file as of 12/29/2020.     Amy Jenetta Downer, NP

## 2021-02-02 DIAGNOSIS — E119 Type 2 diabetes mellitus without complications: Secondary | ICD-10-CM | POA: Diagnosis not present

## 2021-02-02 DIAGNOSIS — E039 Hypothyroidism, unspecified: Secondary | ICD-10-CM | POA: Diagnosis not present

## 2021-02-02 DIAGNOSIS — I10 Essential (primary) hypertension: Secondary | ICD-10-CM | POA: Diagnosis not present

## 2021-02-02 DIAGNOSIS — M5021 Other cervical disc displacement,  high cervical region: Secondary | ICD-10-CM | POA: Diagnosis not present

## 2021-02-02 DIAGNOSIS — M5032 Other cervical disc degeneration, mid-cervical region, unspecified level: Secondary | ICD-10-CM | POA: Diagnosis not present

## 2021-02-02 DIAGNOSIS — E559 Vitamin D deficiency, unspecified: Secondary | ICD-10-CM | POA: Diagnosis not present

## 2021-02-02 DIAGNOSIS — E782 Mixed hyperlipidemia: Secondary | ICD-10-CM | POA: Diagnosis not present

## 2021-02-02 DIAGNOSIS — M50322 Other cervical disc degeneration at C5-C6 level: Secondary | ICD-10-CM | POA: Diagnosis not present

## 2021-02-09 ENCOUNTER — Other Ambulatory Visit: Payer: Self-pay

## 2021-02-09 ENCOUNTER — Other Ambulatory Visit: Payer: Medicare Other | Admitting: Adult Health Nurse Practitioner

## 2021-02-09 VITALS — BP 142/60 | HR 76

## 2021-02-09 DIAGNOSIS — N189 Chronic kidney disease, unspecified: Secondary | ICD-10-CM | POA: Diagnosis not present

## 2021-02-09 DIAGNOSIS — Z515 Encounter for palliative care: Secondary | ICD-10-CM | POA: Diagnosis not present

## 2021-02-09 DIAGNOSIS — M159 Polyosteoarthritis, unspecified: Secondary | ICD-10-CM

## 2021-02-09 NOTE — Progress Notes (Signed)
[facility] Consult Note Telephone: [phone]  Fax: [phone]    Date of encounter: 02/09/21 PATIENT NAME: Denise [name] [address] [place] [address]   [phone] (home)  DOB: [birth year] MRN: [id number] PRIMARY CARE PROVIDER:    Perrin Maltese, MD,  [place] [address] [phone]  REFERRING PROVIDER:   Stacie Acres NP, [facility]  RESPONSIBLE PARTY:    Denise [name], daughter [phone]   I met face to face with patient and family in home. Palliative Care was asked to follow this patient by consultation request of  Denise Acres NP to address advance care planning and complex medical decision making. This is a follow up visit.                                   ASSESSMENT AND PLAN / RECOMMENDATIONS:   Advance Care Planning/Goals of Care: Goals include to maximize quality of life and symptom management.   CODE STATUS: full code  Symptom Management/Plan:  Osteoarthritis:  Patient having increased pain in neck.  She received call during our visit today and xrays taken last week showed arthritis in her neck.  Discussed increasing her Tylenol from 650 mg Q 8 hours PRN to 1078m Q 8 hrs PRN.  Discussed not going over 30053min a 24 hour period  CKD stage IV:  Unable to see most recent labs in Epic.  Patient received call during our visit and she reported that she was told that her kidney function was worsening.  She states she already sees a nephrologist and will call to set up an appointment.   Follow up Palliative Care Visit: Palliative care will continue to follow for complex medical decision making, advance care planning, and clarification of goals. Will call daughter to update on visit and schedule next appointment.  Patient states that she has been having increased neck pain for the past week.  She had appointment with PCP last week to address this.  She is taking Tylenol 650 mg Q 8 hrs PRN and  has not been getting much relief from this.  States that her appetite comes and goes.  No reported weight loss.  States having frequency, burning, and urgency with urination but states this is not new.  Denies hematuria, fever, increased SOB or cough, N/V/D, constipation.  She does state that she has had a pain to her left side that she points to around her mid ribcage.  Denies trauma, no noted bruising, swelling, redness, or tenderness to touch noted.  Rest of 10 point ROS asked and negative except what is noted in HPI.    I spent 45 minutes providing this consultation. More than 50% of the time in this consultation was spent in counseling and care coordination.   PPS: 60%  HOSPICE ELIGIBILITY/DIAGNOSIS: TBD  Chief Complaint: follow up palliative visit/osteoarthritis pain  HISTORY OF PRESENT ILLNESS:  GlTACORA ATHANASs a [age].o. year old female  With CKD stage 4, HTN, HLD, hyperparathyroidism d/t renal insufficiency, DMT2, renal disorder d/t DM, anemia of chronic disease, a-fib, lumbar spondylosis, sinus node dysfunction, COPD, GERD, hypothyroidism.   History obtained from review of EMR and interview with Denise [name].   PHYSICAL EXAM:   General: NAD, frail appearing Eyes: sclera anicteric and noninjected with no discharge noted ENMT: noist mucous membranes Cardiovascular: regular rate and rhythm Pulmonary: lung sounds clear;  normal respiratory effort Abdomen: soft, nontender, + bowel sounds Extremities: trace edema, no joint deformities Skin: no rashes on exposed skin Neurological: Weakness but otherwise nonfocal; has forgetfulness  Thank you for the opportunity to participate in the care of Denise [name].  The palliative care team will continue to follow. Please call our office at [phone] if we can be of additional assistance.    K , NP , DNP  This chart was dictated using voice recognition software. Despite best efforts to proofread, errors can occur which can change the  documentation meaning.   COVID-19 PATIENT SCREENING TOOL Asked and negative response unless otherwise noted:   Have you had symptoms of covid, tested positive or been in contact with someone with symptoms/positive test in the past 5-10 days? negative  

## 2021-02-11 ENCOUNTER — Telehealth: Payer: Self-pay | Admitting: Adult Health Nurse Practitioner

## 2021-02-11 NOTE — Telephone Encounter (Signed)
Called daughter to update on visit with her mother.  Was unable to leave VM as VM box full. Shomari Scicchitano K. Olena Heckle NP

## 2021-02-16 DIAGNOSIS — H353221 Exudative age-related macular degeneration, left eye, with active choroidal neovascularization: Secondary | ICD-10-CM | POA: Diagnosis not present

## 2021-02-16 DIAGNOSIS — H353211 Exudative age-related macular degeneration, right eye, with active choroidal neovascularization: Secondary | ICD-10-CM | POA: Diagnosis not present

## 2021-03-01 ENCOUNTER — Ambulatory Visit (INDEPENDENT_AMBULATORY_CARE_PROVIDER_SITE_OTHER): Payer: Medicare Other

## 2021-03-01 DIAGNOSIS — I4891 Unspecified atrial fibrillation: Secondary | ICD-10-CM | POA: Diagnosis not present

## 2021-03-02 LAB — CUP PACEART REMOTE DEVICE CHECK
Battery Remaining Longevity: 73 mo
Battery Remaining Percentage: 95.5 %
Battery Voltage: 2.99 V
Brady Statistic AP VP Percent: 1 %
Brady Statistic AP VS Percent: 79 %
Brady Statistic AS VP Percent: 1 %
Brady Statistic AS VS Percent: 19 %
Brady Statistic RA Percent Paced: 76 %
Brady Statistic RV Percent Paced: 1 %
Date Time Interrogation Session: 20220509020014
Implantable Lead Implant Date: 20210205
Implantable Lead Implant Date: 20210205
Implantable Lead Location: 753859
Implantable Lead Location: 753860
Implantable Pulse Generator Implant Date: 20210205
Lead Channel Impedance Value: 430 Ohm
Lead Channel Impedance Value: 510 Ohm
Lead Channel Pacing Threshold Amplitude: 0.5 V
Lead Channel Pacing Threshold Amplitude: 0.75 V
Lead Channel Pacing Threshold Pulse Width: 0.5 ms
Lead Channel Pacing Threshold Pulse Width: 0.5 ms
Lead Channel Sensing Intrinsic Amplitude: 1.5 mV
Lead Channel Sensing Intrinsic Amplitude: 12 mV
Lead Channel Setting Pacing Amplitude: 3.5 V
Lead Channel Setting Pacing Amplitude: 3.5 V
Lead Channel Setting Pacing Pulse Width: 0.5 ms
Lead Channel Setting Sensing Sensitivity: 2 mV
Pulse Gen Model: 2272
Pulse Gen Serial Number: 9199360

## 2021-03-11 ENCOUNTER — Other Ambulatory Visit: Payer: Self-pay

## 2021-03-11 ENCOUNTER — Inpatient Hospital Stay
Admission: EM | Admit: 2021-03-11 | Discharge: 2021-03-18 | DRG: 253 | Disposition: A | Discharge status: Skilled Nursing Facility | Payer: Medicare Other | Attending: Internal Medicine | Admitting: Internal Medicine

## 2021-03-11 DIAGNOSIS — J439 Emphysema, unspecified: Secondary | ICD-10-CM | POA: Diagnosis present

## 2021-03-11 DIAGNOSIS — Z794 Long term (current) use of insulin: Secondary | ICD-10-CM

## 2021-03-11 DIAGNOSIS — E785 Hyperlipidemia, unspecified: Secondary | ICD-10-CM | POA: Diagnosis present

## 2021-03-11 DIAGNOSIS — R404 Transient alteration of awareness: Secondary | ICD-10-CM | POA: Diagnosis not present

## 2021-03-11 DIAGNOSIS — R600 Localized edema: Secondary | ICD-10-CM | POA: Diagnosis not present

## 2021-03-11 DIAGNOSIS — N2581 Secondary hyperparathyroidism of renal origin: Secondary | ICD-10-CM | POA: Diagnosis present

## 2021-03-11 DIAGNOSIS — Z20822 Contact with and (suspected) exposure to covid-19: Secondary | ICD-10-CM | POA: Diagnosis not present

## 2021-03-11 DIAGNOSIS — Z833 Family history of diabetes mellitus: Secondary | ICD-10-CM

## 2021-03-11 DIAGNOSIS — Z7901 Long term (current) use of anticoagulants: Secondary | ICD-10-CM | POA: Diagnosis not present

## 2021-03-11 DIAGNOSIS — E039 Hypothyroidism, unspecified: Secondary | ICD-10-CM | POA: Diagnosis not present

## 2021-03-11 DIAGNOSIS — S45991A Other specified injury of unspecified blood vessel at shoulder and upper arm level, right arm, initial encounter: Secondary | ICD-10-CM | POA: Diagnosis present

## 2021-03-11 DIAGNOSIS — M7981 Nontraumatic hematoma of soft tissue: Secondary | ICD-10-CM | POA: Diagnosis not present

## 2021-03-11 DIAGNOSIS — T148XXA Other injury of unspecified body region, initial encounter: Secondary | ICD-10-CM

## 2021-03-11 DIAGNOSIS — Z743 Need for continuous supervision: Secondary | ICD-10-CM | POA: Diagnosis not present

## 2021-03-11 DIAGNOSIS — I10 Essential (primary) hypertension: Secondary | ICD-10-CM | POA: Diagnosis not present

## 2021-03-11 DIAGNOSIS — Z7401 Bed confinement status: Secondary | ICD-10-CM | POA: Diagnosis not present

## 2021-03-11 DIAGNOSIS — N189 Chronic kidney disease, unspecified: Secondary | ICD-10-CM

## 2021-03-11 DIAGNOSIS — K219 Gastro-esophageal reflux disease without esophagitis: Secondary | ICD-10-CM | POA: Diagnosis not present

## 2021-03-11 DIAGNOSIS — E569 Vitamin deficiency, unspecified: Secondary | ICD-10-CM | POA: Diagnosis not present

## 2021-03-11 DIAGNOSIS — H04123 Dry eye syndrome of bilateral lacrimal glands: Secondary | ICD-10-CM | POA: Diagnosis not present

## 2021-03-11 DIAGNOSIS — Y712 Prosthetic and other implants, materials and accessory cardiovascular devices associated with adverse incidents: Secondary | ICD-10-CM | POA: Diagnosis present

## 2021-03-11 DIAGNOSIS — D631 Anemia in chronic kidney disease: Secondary | ICD-10-CM | POA: Diagnosis not present

## 2021-03-11 DIAGNOSIS — N281 Cyst of kidney, acquired: Secondary | ICD-10-CM | POA: Diagnosis not present

## 2021-03-11 DIAGNOSIS — R2681 Unsteadiness on feet: Secondary | ICD-10-CM | POA: Diagnosis not present

## 2021-03-11 DIAGNOSIS — Z8249 Family history of ischemic heart disease and other diseases of the circulatory system: Secondary | ICD-10-CM

## 2021-03-11 DIAGNOSIS — M109 Gout, unspecified: Secondary | ICD-10-CM | POA: Diagnosis not present

## 2021-03-11 DIAGNOSIS — Z736 Limitation of activities due to disability: Secondary | ICD-10-CM | POA: Diagnosis not present

## 2021-03-11 DIAGNOSIS — M6281 Muscle weakness (generalized): Secondary | ICD-10-CM | POA: Diagnosis not present

## 2021-03-11 DIAGNOSIS — I480 Paroxysmal atrial fibrillation: Secondary | ICD-10-CM | POA: Diagnosis present

## 2021-03-11 DIAGNOSIS — M1990 Unspecified osteoarthritis, unspecified site: Secondary | ICD-10-CM | POA: Diagnosis not present

## 2021-03-11 DIAGNOSIS — R531 Weakness: Secondary | ICD-10-CM | POA: Diagnosis not present

## 2021-03-11 DIAGNOSIS — Z96641 Presence of right artificial hip joint: Secondary | ICD-10-CM | POA: Diagnosis not present

## 2021-03-11 DIAGNOSIS — Z79899 Other long term (current) drug therapy: Secondary | ICD-10-CM

## 2021-03-11 DIAGNOSIS — M1000 Idiopathic gout, unspecified site: Secondary | ICD-10-CM | POA: Diagnosis not present

## 2021-03-11 DIAGNOSIS — I129 Hypertensive chronic kidney disease with stage 1 through stage 4 chronic kidney disease, or unspecified chronic kidney disease: Secondary | ICD-10-CM | POA: Diagnosis not present

## 2021-03-11 DIAGNOSIS — E1169 Type 2 diabetes mellitus with other specified complication: Secondary | ICD-10-CM | POA: Diagnosis not present

## 2021-03-11 DIAGNOSIS — E1122 Type 2 diabetes mellitus with diabetic chronic kidney disease: Secondary | ICD-10-CM | POA: Diagnosis not present

## 2021-03-11 DIAGNOSIS — T82590A Other mechanical complication of surgically created arteriovenous fistula, initial encounter: Principal | ICD-10-CM | POA: Diagnosis present

## 2021-03-11 DIAGNOSIS — F0390 Unspecified dementia without behavioral disturbance: Secondary | ICD-10-CM | POA: Diagnosis present

## 2021-03-11 DIAGNOSIS — J019 Acute sinusitis, unspecified: Secondary | ICD-10-CM | POA: Diagnosis not present

## 2021-03-11 DIAGNOSIS — S45991D Other specified injury of unspecified blood vessel at shoulder and upper arm level, right arm, subsequent encounter: Secondary | ICD-10-CM | POA: Diagnosis not present

## 2021-03-11 DIAGNOSIS — R001 Bradycardia, unspecified: Secondary | ICD-10-CM | POA: Diagnosis not present

## 2021-03-11 DIAGNOSIS — N179 Acute kidney failure, unspecified: Secondary | ICD-10-CM | POA: Diagnosis not present

## 2021-03-11 DIAGNOSIS — G4700 Insomnia, unspecified: Secondary | ICD-10-CM | POA: Diagnosis not present

## 2021-03-11 DIAGNOSIS — R609 Edema, unspecified: Secondary | ICD-10-CM | POA: Diagnosis not present

## 2021-03-11 DIAGNOSIS — R52 Pain, unspecified: Secondary | ICD-10-CM | POA: Diagnosis not present

## 2021-03-11 DIAGNOSIS — T82898A Other specified complication of vascular prosthetic devices, implants and grafts, initial encounter: Secondary | ICD-10-CM | POA: Diagnosis not present

## 2021-03-11 DIAGNOSIS — M7989 Other specified soft tissue disorders: Secondary | ICD-10-CM

## 2021-03-11 DIAGNOSIS — N184 Chronic kidney disease, stage 4 (severe): Secondary | ICD-10-CM | POA: Diagnosis not present

## 2021-03-11 DIAGNOSIS — J449 Chronic obstructive pulmonary disease, unspecified: Secondary | ICD-10-CM | POA: Diagnosis not present

## 2021-03-11 DIAGNOSIS — I721 Aneurysm of artery of upper extremity: Secondary | ICD-10-CM | POA: Diagnosis not present

## 2021-03-11 DIAGNOSIS — T82858A Stenosis of vascular prosthetic devices, implants and grafts, initial encounter: Secondary | ICD-10-CM | POA: Diagnosis not present

## 2021-03-11 DIAGNOSIS — T82838A Hemorrhage of vascular prosthetic devices, implants and grafts, initial encounter: Secondary | ICD-10-CM | POA: Diagnosis not present

## 2021-03-11 DIAGNOSIS — F32A Depression, unspecified: Secondary | ICD-10-CM | POA: Diagnosis not present

## 2021-03-11 DIAGNOSIS — Z800 Family history of malignant neoplasm of digestive organs: Secondary | ICD-10-CM

## 2021-03-11 DIAGNOSIS — I770 Arteriovenous fistula, acquired: Secondary | ICD-10-CM | POA: Diagnosis not present

## 2021-03-11 DIAGNOSIS — N186 End stage renal disease: Secondary | ICD-10-CM | POA: Diagnosis not present

## 2021-03-11 DIAGNOSIS — Y832 Surgical operation with anastomosis, bypass or graft as the cause of abnormal reaction of the patient, or of later complication, without mention of misadventure at the time of the procedure: Secondary | ICD-10-CM | POA: Diagnosis present

## 2021-03-11 DIAGNOSIS — Z87891 Personal history of nicotine dependence: Secondary | ICD-10-CM

## 2021-03-11 DIAGNOSIS — M79601 Pain in right arm: Secondary | ICD-10-CM | POA: Diagnosis not present

## 2021-03-11 DIAGNOSIS — M2550 Pain in unspecified joint: Secondary | ICD-10-CM | POA: Diagnosis not present

## 2021-03-11 DIAGNOSIS — Z888 Allergy status to other drugs, medicaments and biological substances status: Secondary | ICD-10-CM

## 2021-03-11 DIAGNOSIS — R6889 Other general symptoms and signs: Secondary | ICD-10-CM | POA: Diagnosis not present

## 2021-03-11 NOTE — ED Triage Notes (Signed)
Pt had a fistula put in right arm but states it is not for dialysis. Has been in place for years and today noted a large amount of swelling. Has not been accessed for years and has not had any trauma to area.

## 2021-03-12 ENCOUNTER — Emergency Department: Payer: Medicare Other

## 2021-03-12 ENCOUNTER — Encounter: Payer: Self-pay | Admitting: Family Medicine

## 2021-03-12 DIAGNOSIS — N186 End stage renal disease: Secondary | ICD-10-CM

## 2021-03-12 DIAGNOSIS — T148XXA Other injury of unspecified body region, initial encounter: Secondary | ICD-10-CM

## 2021-03-12 DIAGNOSIS — T82858A Stenosis of vascular prosthetic devices, implants and grafts, initial encounter: Secondary | ICD-10-CM | POA: Diagnosis not present

## 2021-03-12 DIAGNOSIS — S45991A Other specified injury of unspecified blood vessel at shoulder and upper arm level, right arm, initial encounter: Secondary | ICD-10-CM | POA: Diagnosis present

## 2021-03-12 DIAGNOSIS — I721 Aneurysm of artery of upper extremity: Secondary | ICD-10-CM | POA: Diagnosis not present

## 2021-03-12 DIAGNOSIS — R600 Localized edema: Secondary | ICD-10-CM | POA: Diagnosis not present

## 2021-03-12 DIAGNOSIS — T82838A Hemorrhage of vascular prosthetic devices, implants and grafts, initial encounter: Secondary | ICD-10-CM | POA: Diagnosis not present

## 2021-03-12 DIAGNOSIS — I770 Arteriovenous fistula, acquired: Secondary | ICD-10-CM | POA: Diagnosis not present

## 2021-03-12 LAB — COMPREHENSIVE METABOLIC PANEL WITH GFR
ALT: 11 U/L (ref 0–44)
AST: 21 U/L (ref 15–41)
Albumin: 3.3 g/dL — ABNORMAL LOW (ref 3.5–5.0)
Alkaline Phosphatase: 111 U/L (ref 38–126)
Anion gap: 9 (ref 5–15)
BUN: 35 mg/dL — ABNORMAL HIGH (ref 8–23)
CO2: 25 mmol/L (ref 22–32)
Calcium: 8.4 mg/dL — ABNORMAL LOW (ref 8.9–10.3)
Chloride: 109 mmol/L (ref 98–111)
Creatinine, Ser: 2.65 mg/dL — ABNORMAL HIGH (ref 0.44–1.00)
GFR, Estimated: 17 mL/min — ABNORMAL LOW
Glucose, Bld: 139 mg/dL — ABNORMAL HIGH (ref 70–99)
Potassium: 4.1 mmol/L (ref 3.5–5.1)
Sodium: 143 mmol/L (ref 135–145)
Total Bilirubin: 0.8 mg/dL (ref 0.3–1.2)
Total Protein: 6.1 g/dL — ABNORMAL LOW (ref 6.5–8.1)

## 2021-03-12 LAB — BASIC METABOLIC PANEL
Anion gap: 6 (ref 5–15)
BUN: 35 mg/dL — ABNORMAL HIGH (ref 8–23)
CO2: 28 mmol/L (ref 22–32)
Calcium: 8.7 mg/dL — ABNORMAL LOW (ref 8.9–10.3)
Chloride: 110 mmol/L (ref 98–111)
Creatinine, Ser: 2.53 mg/dL — ABNORMAL HIGH (ref 0.44–1.00)
GFR, Estimated: 18 mL/min — ABNORMAL LOW (ref >60)
Glucose, Bld: 123 mg/dL — ABNORMAL HIGH (ref 70–99)
Potassium: 3.9 mmol/L (ref 3.5–5.1)
Sodium: 144 mmol/L (ref 135–145)

## 2021-03-12 LAB — HEMOGLOBIN A1C
Hgb A1c MFr Bld: 7 % — ABNORMAL HIGH (ref 4.8–5.6)
Mean Plasma Glucose: 154.2 mg/dL

## 2021-03-12 LAB — PROTIME-INR
INR: 1.4 — ABNORMAL HIGH (ref 0.8–1.2)
Prothrombin Time: 16.9 s — ABNORMAL HIGH (ref 11.4–15.2)

## 2021-03-12 LAB — GLUCOSE, CAPILLARY
Glucose-Capillary: 114 mg/dL — ABNORMAL HIGH (ref 70–99)
Glucose-Capillary: 82 mg/dL (ref 70–99)
Glucose-Capillary: 123 mg/dL — ABNORMAL HIGH (ref 70–99)
Glucose-Capillary: 143 mg/dL — ABNORMAL HIGH (ref 70–99)

## 2021-03-12 LAB — CBC
HCT: 30.1 % — ABNORMAL LOW (ref 36.0–46.0)
HCT: 30.5 % — ABNORMAL LOW (ref 36.0–46.0)
Hemoglobin: 9.8 g/dL — ABNORMAL LOW (ref 12.0–15.0)
Hemoglobin: 9.9 g/dL — ABNORMAL LOW (ref 12.0–15.0)
MCH: 30.2 pg (ref 26.0–34.0)
MCH: 30.3 pg (ref 26.0–34.0)
MCHC: 32.6 g/dL (ref 30.0–36.0)
MCHC: 32.5 g/dL (ref 30.0–36.0)
MCV: 92.9 fL (ref 80.0–100.0)
MCV: 93.3 fL (ref 80.0–100.0)
Platelets: 136 K/uL — ABNORMAL LOW (ref 150–400)
Platelets: 124 10*3/uL — ABNORMAL LOW (ref 150–400)
RBC: 3.24 MIL/uL — ABNORMAL LOW (ref 3.87–5.11)
RBC: 3.27 MIL/uL — ABNORMAL LOW (ref 3.87–5.11)
RDW: 13.9 % (ref 11.5–15.5)
RDW: 14 % (ref 11.5–15.5)
WBC: 8.7 K/uL (ref 4.0–10.5)
WBC: 8.2 10*3/uL (ref 4.0–10.5)
nRBC: 0 % (ref 0.0–0.2)
nRBC: 0 % (ref 0.0–0.2)

## 2021-03-12 LAB — RESP PANEL BY RT-PCR (FLU A&B, COVID) ARPGX2
Influenza A by PCR: NEGATIVE
Influenza B by PCR: NEGATIVE
SARS Coronavirus 2 by RT PCR: NEGATIVE

## 2021-03-12 MED ORDER — INSULIN ASPART 100 UNIT/ML IJ SOLN
0.0000 [IU] | Freq: Three times a day (TID) | INTRAMUSCULAR | Status: DC
  Administered 2021-03-12: 1 [IU] via SUBCUTANEOUS
  Administered 2021-03-13: 2 [IU] via SUBCUTANEOUS
  Administered 2021-03-15: 1 [IU] via SUBCUTANEOUS
  Administered 2021-03-15: 2 [IU] via SUBCUTANEOUS
  Administered 2021-03-16: 1 [IU] via SUBCUTANEOUS
  Administered 2021-03-18: 2 [IU] via SUBCUTANEOUS
  Filled 2021-03-12 (×6): qty 1

## 2021-03-12 MED ORDER — ALLOPURINOL 100 MG PO TABS
100.0000 mg | ORAL_TABLET | Freq: Every evening | ORAL | Status: DC
  Administered 2021-03-12 – 2021-03-16 (×3): 100 mg via ORAL
  Filled 2021-03-12 (×5): qty 1

## 2021-03-12 MED ORDER — ATORVASTATIN CALCIUM 20 MG PO TABS
40.0000 mg | ORAL_TABLET | Freq: Every morning | ORAL | Status: DC
  Administered 2021-03-12 – 2021-03-18 (×5): 40 mg via ORAL
  Filled 2021-03-12 (×5): qty 2

## 2021-03-12 MED ORDER — MORPHINE SULFATE (PF) 2 MG/ML IV SOLN
2.0000 mg | INTRAVENOUS | Status: DC | PRN
  Administered 2021-03-12 (×3): 2 mg via INTRAVENOUS
  Filled 2021-03-12 (×3): qty 1

## 2021-03-12 MED ORDER — ACETAMINOPHEN 650 MG RE SUPP: 650.0000 mg | Freq: Four times a day (QID) | RECTAL | Status: DC | PRN

## 2021-03-12 MED ORDER — ACETAMINOPHEN 325 MG PO TABS
650.0000 mg | ORAL_TABLET | Freq: Four times a day (QID) | ORAL | Status: DC | PRN
  Administered 2021-03-14: 650 mg via ORAL
  Filled 2021-03-12 (×2): qty 2

## 2021-03-12 MED ORDER — LEVOTHYROXINE SODIUM 100 MCG PO TABS
100.0000 ug | ORAL_TABLET | Freq: Every day | ORAL | Status: DC
  Administered 2021-03-15 – 2021-03-17 (×3): 100 ug via ORAL
  Filled 2021-03-12 (×2): qty 1
  Filled 2021-03-12: qty 2
  Filled 2021-03-12: qty 1

## 2021-03-12 MED ORDER — FLUTICASONE PROPIONATE 50 MCG/ACT NA SUSP
2.0000 | Freq: Every day | NASAL | Status: DC
  Administered 2021-03-12 – 2021-03-18 (×5): 2 via NASAL
  Filled 2021-03-12: qty 16

## 2021-03-12 MED ORDER — TIOTROPIUM BROMIDE MONOHYDRATE 18 MCG IN CAPS
1.0000 | ORAL_CAPSULE | Freq: Every day | RESPIRATORY_TRACT | Status: DC
  Administered 2021-03-12 – 2021-03-18 (×5): 18 ug via RESPIRATORY_TRACT
  Filled 2021-03-12: qty 5

## 2021-03-12 MED ORDER — FUROSEMIDE 40 MG PO TABS
40.0000 mg | ORAL_TABLET | Freq: Every morning | ORAL | Status: DC
  Administered 2021-03-12 – 2021-03-18 (×5): 40 mg via ORAL
  Filled 2021-03-12 (×6): qty 1

## 2021-03-12 MED ORDER — MAGNESIUM HYDROXIDE 400 MG/5ML PO SUSP: 30.0000 mL | Freq: Every day | ORAL | Status: DC | PRN

## 2021-03-12 MED ORDER — HYDRALAZINE HCL 20 MG/ML IJ SOLN
10.0000 mg | Freq: Four times a day (QID) | INTRAMUSCULAR | Status: DC | PRN
  Administered 2021-03-17: 10 mg via INTRAVENOUS
  Filled 2021-03-12: qty 1

## 2021-03-12 MED ORDER — ADULT MULTIVITAMIN W/MINERALS CH
1.0000 | ORAL_TABLET | Freq: Every day | ORAL | Status: DC
  Administered 2021-03-13 – 2021-03-18 (×4): 1 via ORAL
  Filled 2021-03-12 (×5): qty 1

## 2021-03-12 MED ORDER — AMOXICILLIN-POT CLAVULANATE 875-125 MG PO TABS
1.0000 | ORAL_TABLET | Freq: Two times a day (BID) | ORAL | Status: DC
  Administered 2021-03-12: 1 via ORAL
  Filled 2021-03-12 (×2): qty 1

## 2021-03-12 MED ORDER — PANTOPRAZOLE SODIUM 40 MG PO TBEC
40.0000 mg | DELAYED_RELEASE_TABLET | Freq: Every morning | ORAL | Status: DC
  Administered 2021-03-12 – 2021-03-18 (×5): 40 mg via ORAL
  Filled 2021-03-12 (×4): qty 1

## 2021-03-12 MED ORDER — SODIUM CHLORIDE 0.9 % IV SOLN: INTRAVENOUS | Status: DC

## 2021-03-12 MED ORDER — ASCORBIC ACID 500 MG PO TABS
250.0000 mg | ORAL_TABLET | Freq: Every day | ORAL | Status: DC
  Administered 2021-03-12 – 2021-03-18 (×4): 250 mg via ORAL
  Filled 2021-03-12 (×5): qty 1

## 2021-03-12 MED ORDER — ONDANSETRON HCL 4 MG PO TABS: 4.0000 mg | ORAL_TABLET | Freq: Four times a day (QID) | ORAL | Status: DC | PRN

## 2021-03-12 MED ORDER — AMOXICILLIN-POT CLAVULANATE 500-125 MG PO TABS
1.0000 | ORAL_TABLET | Freq: Two times a day (BID) | ORAL | Status: AC
  Administered 2021-03-12 – 2021-03-16 (×8): 500 mg via ORAL
  Filled 2021-03-12 (×10): qty 1

## 2021-03-12 MED ORDER — ENSURE ENLIVE PO LIQD
237.0000 mL | Freq: Two times a day (BID) | ORAL | Status: DC
  Administered 2021-03-12 – 2021-03-18 (×5): 237 mL via ORAL

## 2021-03-12 MED ORDER — ONDANSETRON HCL 4 MG/2ML IJ SOLN
4.0000 mg | Freq: Four times a day (QID) | INTRAMUSCULAR | Status: DC | PRN
  Administered 2021-03-13: 4 mg via INTRAVENOUS
  Filled 2021-03-12: qty 2

## 2021-03-12 MED ORDER — GUAIFENESIN ER 600 MG PO TB12
600.0000 mg | ORAL_TABLET | Freq: Two times a day (BID) | ORAL | Status: DC
  Administered 2021-03-12 – 2021-03-18 (×10): 600 mg via ORAL
  Filled 2021-03-12 (×12): qty 1

## 2021-03-12 MED ORDER — FENTANYL CITRATE (PF) 100 MCG/2ML IJ SOLN
50.0000 ug | Freq: Once | INTRAMUSCULAR | Status: AC
  Administered 2021-03-12: 50 ug via INTRAVENOUS
  Filled 2021-03-12: qty 2

## 2021-03-12 MED ORDER — INSULIN GLARGINE 100 UNIT/ML ~~LOC~~ SOLN
25.0000 [IU] | Freq: Every evening | SUBCUTANEOUS | Status: DC
  Administered 2021-03-12 – 2021-03-16 (×3): 25 [IU] via SUBCUTANEOUS
  Filled 2021-03-12 (×6): qty 0.25

## 2021-03-12 MED ORDER — METOPROLOL SUCCINATE ER 50 MG PO TB24
150.0000 mg | ORAL_TABLET | Freq: Every day | ORAL | Status: DC
  Administered 2021-03-13: 150 mg via ORAL
  Filled 2021-03-12 (×3): qty 3
  Filled 2021-03-12: qty 1

## 2021-03-12 MED ORDER — APIXABAN 2.5 MG PO TABS: 2.5000 mg | ORAL_TABLET | Freq: Two times a day (BID) | ORAL | Status: DC

## 2021-03-12 MED ORDER — IRBESARTAN 150 MG PO TABS
75.0000 mg | ORAL_TABLET | Freq: Every day | ORAL | Status: DC
  Administered 2021-03-12 – 2021-03-18 (×5): 75 mg via ORAL
  Filled 2021-03-12 (×7): qty 1

## 2021-03-12 MED ORDER — TRAZODONE HCL 50 MG PO TABS: 25.0000 mg | ORAL_TABLET | Freq: Every evening | ORAL | Status: DC | PRN

## 2021-03-12 NOTE — Progress Notes (Signed)
No charge progress note.  Denise [name] is a 87 y.o. Caucasian female with medical history significant for atrial fibrillation on Eliquis, stage IV chronic kidney disease, COPD, type 2 diabetes mellitus, emphysema, GERD, hypertension, osteoarthritis and hypothyroidism, who presented to the ER with acute onset of right upper extremity swelling and pain after lifting her dog and hearing something popping in her right arm.  This happened at the site of the right upper extremity fistula that was done in 2017 however the patient has not had any dialysis since then.   Found to have an hematoma at fistula site.  Vascular surgery was consulted but they would try to manage conservatively.  They were recommending holding Eliquis and monitoring hemoglobin at this time.  AKI seems improving. Continue to hold Eliquis for at least 24-hour as recommended by vascular surgery. Continue with conservative and symptom management which include pain management and icing the area. Monitor hemoglobin.  Patient can be discharged tomorrow if remains stable and vascular still does not want any procedure.

## 2021-03-12 NOTE — Progress Notes (Signed)
Initial Nutrition Assessment  DOCUMENTATION CODES:   Not applicable  INTERVENTION:   Ensure Enlive po BID, each supplement provides 350 kcal and 20 grams of protein  MVI po daily   Liberalize diet   NUTRITION DIAGNOSIS:   Increased nutrient needs related to chronic illness (COPD) as evidenced by estimated needs.  GOAL:   Patient will meet greater than or equal to 90% of their needs  MONITOR:   PO intake,Supplement acceptance,Labs,Weight trends,Skin,I & O's  REASON FOR ASSESSMENT:   Malnutrition Screening Tool    ASSESSMENT:   87 y.o. caucasian female with medical history significant for atrial fibrillation on Eliquis, stage IV chronic kidney disease, COPD, type 2 diabetes mellitus, emphysema, GERD, hypertension, osteoarthritis, dementia and hypothyroidism who presented to the ER with hematoma at fistula site.   Met with pt in room today. Pt reports decreased appetite and oral intake at baseline. Pt ate 20% of her lunch today; pt reports that she is just not hungry. Pt does report that she drinks chocolate Ensure at home. Per chart, pt appears weight stable at baseline. RD will add supplements and MVI to help pt meet her estimated needs. RD will also liberalize pt's diet.   Medications reviewed and include: allopurionl, augmentin, vitamin C, lasix, insulin, synthroid, protonix, NaCl _0 /hr  Labs reviewed: K 3.9 wnl, BUN 35(H), creat 2.53(H) Hgb 9.9(L), Hct 30.5(L) cbgs- 114, 82 x 24 hrs AIC 7.0(H)- 5/20  NUTRITION - FOCUSED PHYSICAL EXAM:  Flowsheet Row Most Recent Value  Orbital Region No depletion  Upper Arm Region Moderate depletion  Thoracic and Lumbar Region No depletion  Buccal Region No depletion  Temple Region No depletion  Clavicle Bone Region Mild depletion  Clavicle and Acromion Bone Region Mild depletion  Scapular Bone Region No depletion  Dorsal Hand Severe depletion  Patellar Region Moderate depletion  Anterior Thigh Region No depletion   Posterior Calf Region No depletion  Edema (RD Assessment) Moderate  Hair Reviewed  Eyes Reviewed  Mouth Reviewed  Skin Reviewed  Nails Reviewed     Diet Order:   Diet Order            Diet heart healthy/carb modified Room service appropriate? Yes; Fluid consistency: Thin  Diet effective now                EDUCATION NEEDS:   Education needs have been addressed  Skin:  Skin Assessment: Reviewed RN Assessment (ecchymosis)  Last BM:  5/19  Height:   Ht Readings from Last 1 Encounters:  03/11/21 _1  (1.676 m)    Weight:   Wt Readings from Last 1 Encounters:  03/11/21 77.1 kg    Ideal Body Weight:  59 kg  BMI:  Body mass index is 27.44 kg/m.  Estimated Nutritional Needs:   Kcal:  1600-1800kcal/day  Protein:  80-90g/day  Fluid:  1.5-1.8L/day  Koleen Distance MS, RD, LDN Please refer to [facility] for RD and/or RD on-call/weekend/after hours pager

## 2021-03-12 NOTE — Consult Note (Signed)
[facility] Vascular Consult Note  MRN : [id number]  Denise [name] is a 87 y.o. ([birth year]) female who presents with chief complaint of  Chief Complaint  Patient presents with  . Arm Pain   History of Present Illness: Denise [name] is a 87 y.o. Caucasian female with medical history significant for atrial fibrillation on Eliquis, stage IV chronic kidney disease, COPD, type 2 diabetes mellitus, emphysema, GERD, hypertension, osteoarthritis and hypothyroidism, who presented to the ER with acute onset of right upper extremity swelling and pain after lifting her dog and hearing something popping in her right arm.  This happened at the site of the right upper extremity fistula that was done in 2017 however the patient has not had any dialysis since then.  She denies any fevers or chills.  No nausea or vomiting.  She has been having cough all week with clear expectoration without significant dyspnea or wheezing.  She denies any fever or chills.  She admits to nasal congestion and sinus pressure.  She has occasional headache without dizziness or blurred vision.  ED Course: When she came to the ER blood pressure was 168/69 with otherwise normal vital signs.  Labs revealed a BUN of 35 and creatinine 2.65 above her previous creatinine of 2.1 with albumin of 3.3 and total protein 6.1.  CBC showed anemia close to baseline.  Influenza antigens and COVID-19 PCR came back negative.    03/12/21 Venous Duplex: Right upper extremity brachiocephalic fistula. 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 Formal dialysis fistulagram is recommended for further evaluation.  Vascular surgery was consulted by Dr. Sidney Ace for possible endovascular intervention in the setting of hematoma.  Current Facility-Administered Medications  Medication Dose Route  Frequency Provider Last Rate Last Admin  . 0.9 %  sodium chloride infusion   Intravenous Continuous Mansy, Arvella Merles, MD 100 mL/hr at 03/12/21 0651 New Bag at 03/12/21 [id number]  . acetaminophen (TYLENOL) tablet 650 mg  650 mg Oral Q6H PRN Mansy, Jan A, MD       Or  . acetaminophen (TYLENOL) suppository 650 mg  650 mg Rectal Q6H PRN Mansy, Jan A, MD      . allopurinol (ZYLOPRIM) tablet 100 mg  100 mg Oral QPM Mansy, Jan A, MD      . amoxicillin-clavulanate (AUGMENTIN) 875-125 MG per tablet 1 tablet  1 tablet Oral Q12H Mansy, Jan A, MD      . ascorbic acid (VITAMIN C) tablet 250 mg  250 mg Oral QPC lunch Mansy, Jan A, MD      . atorvastatin (LIPITOR) tablet 40 mg  40 mg Oral q morning Mansy, Jan A, MD      . fluticasone [facility]) 50 MCG/ACT nasal spray 2 spray  2 spray Each Nare Daily Mansy, Jan A, MD      . furosemide (LASIX) tablet 40 mg  40 mg Oral q morning Mansy, Jan A, MD      . guaiFENesin (MUCINEX) 12 hr tablet 600 mg  600 mg Oral BID Mansy, Jan A, MD      . hydrALAZINE (APRESOLINE) injection 10 mg  10 mg Intravenous Q6H PRN Mansy, Jan A, MD      . insulin aspart (novoLOG) injection 0-9 Units  0-9 Units Subcutaneous TID AC & HS Mansy, Jan A, MD      . insulin glargine (LANTUS) injection 25  Units  25 Units Subcutaneous QPM Mansy, Jan A, MD      . irbesartan (AVAPRO) tablet 75 mg  75 mg Oral Daily Mansy, Jan A, MD      . levothyroxine (SYNTHROID) tablet 100 mcg  100 mcg Oral Q0600 Mansy, Jan A, MD      . magnesium hydroxide (MILK OF MAGNESIA) suspension 30 mL  30 mL Oral Daily PRN Mansy, Jan A, MD      . metoprolol succinate (TOPROL-XL) 24 hr tablet 150 mg  150 mg Oral Q breakfast Mansy, Jan A, MD      . morphine 2 MG/ML injection 2 mg  2 mg Intravenous Q4H PRN Mansy, Jan A, MD   2 mg at 03/12/21 0646  . ondansetron (ZOFRAN) tablet 4 mg  4 mg Oral Q6H PRN Mansy, Jan A, MD       Or  . ondansetron [facility]) injection 4 mg  4 mg Intravenous Q6H PRN Mansy, Jan A, MD      . pantoprazole (PROTONIX) EC  tablet 40 mg  40 mg Oral q morning Mansy, Jan A, MD      . tiotropium [facility]) inhalation capsule [facility] use ONLY) 18 mcg  1 capsule Inhalation QPC lunch Mansy, Jan A, MD      . traZODone (DESYREL) tablet 25 mg  25 mg Oral QHS PRN Mansy, Arvella Merles, MD       Past Medical History:  Diagnosis Date  . A-fib ([place])   . Arthritis   . Cataract   . Chronic kidney disease   . COPD (chronic obstructive pulmonary disease) ([place])   . Diabetes mellitus without complication ([place])   . Emphysema of lung ([place])   . GERD (gastroesophageal reflux disease)   . Hypertension   . Hypothyroidism    Past Surgical History:  Procedure Laterality Date  . AV FISTULA PLACEMENT Right 11/18/2015   Procedure: ARTERIOVENOUS (AV) FISTULA CREATION;  Surgeon: Algernon Huxley, MD;  Location: [facility] ORS;  Service: Vascular;  Laterality: Right;  . CHOLECYSTECTOMY    . GALLBLADDER SURGERY    . HIP ARTHROPLASTY Right 07/31/2015   Procedure: ARTHROPLASTY BIPOLAR HIP (HEMIARTHROPLASTY);  Surgeon: Corky Mull, MD;  Location: [facility] ORS;  Service: Orthopedics;  Laterality: Right;  . PACEMAKER IMPLANT N/A 11/29/2019   Procedure: PACEMAKER IMPLANT;  Surgeon: Constance Haw, MD;  Location: [place] [facility];  Service: Cardiovascular;  Laterality: N/A;  . TEMPORARY PACEMAKER N/A 11/29/2019   Procedure: TEMPORARY PACEMAKER;  Surgeon: Isaias Cowman, MD;  Location: [place] [facility];  Service: Cardiovascular;  Laterality: N/A;   Social History Social History   Tobacco Use  . Smoking status: Former Smoker    Quit date: 07/28/2013    Years since quitting: 7.6  . Smokeless tobacco: Never Used  Substance Use Topics  . Alcohol use: No  . Drug use: No   Family History Family History  Problem Relation Age of Onset  . Diabetes Mother   . Heart attack Mother   . Stomach cancer Father   . Colon cancer Sister   . Diabetes Sister   . Heart attack Brother   . Colon cancer Brother   . Breast cancer Neg Hx   Denies family history of  peripheral artery disease, venous disease or renal disease.  Allergies  Allergen Reactions  . Iodinated Diagnostic Agents Anaphylaxis, Other (See Comments) and Rash  . Benzocaine-Chloroxylenol-Hc     Years ago  . Benzonatate Rash    Note: pruritis  REVIEW OF SYSTEMS (Negative unless checked)  Constitutional: '[]'$ Weight loss  '[]'$ Fever  '[]'$ Chills Cardiac: '[]'$ Chest pain   '[]'$ Chest pressure   '[]'$ Palpitations   '[]'$ Shortness of breath when laying flat   '[]'$ Shortness of breath at rest   '[]'$ Shortness of breath with exertion. Vascular:  '[]'$ Pain in legs with walking   '[]'$ Pain in legs at rest   '[]'$ Pain in legs when laying flat   '[]'$ Claudication   '[]'$ Pain in feet when walking  '[]'$ Pain in feet at rest  '[]'$ Pain in feet when laying flat   '[]'$ History of DVT   '[]'$ Phlebitis   '[]'$ Swelling in legs   '[]'$ Varicose veins   '[]'$ Non-healing ulcers Pulmonary:   '[]'$ Uses home oxygen   '[]'$ Productive cough   '[]'$ Hemoptysis   '[]'$ Wheeze  '[]'$ COPD   '[]'$ Asthma Neurologic:  '[]'$ Dizziness  '[]'$ Blackouts   '[]'$ Seizures   '[]'$ History of stroke   '[]'$ History of TIA  '[]'$ Aphasia   '[]'$ Temporary blindness   '[]'$ Dysphagia   '[]'$ Weakness or numbness in arms   '[]'$ Weakness or numbness in legs Musculoskeletal:  '[]'$ Arthritis   '[]'$ Joint swelling   '[]'$ Joint pain   '[]'$ Low back pain Hematologic:  '[]'$ Easy bruising  '[]'$ Easy bleeding   '[]'$ Hypercoagulable state   '[]'$ Anemic  '[]'$ Hepatitis Gastrointestinal:  '[]'$ Blood in stool   '[]'$ Vomiting blood  '[]'$ Gastroesophageal reflux/heartburn   '[]'$ Difficulty swallowing. Genitourinary:  '[x]'$ Chronic kidney disease   '[]'$ Difficult urination  '[]'$ Frequent urination  '[]'$ Burning with urination   '[]'$ Blood in urine Skin:  '[]'$ Rashes   '[]'$ Ulcers   '[]'$ Wounds Psychological:  '[]'$ History of anxiety   '[]'$  History of major depression.  Swelling and pain to the right upper extremity  Physical Examination  Vitals:   03/11/21 2350 03/12/21 0300 03/12/21 0418 03/12/21 0756  BP: (!) 168/69 (!) 106/91 (!) 176/68 (!) 156/49  Pulse: 67 (!) 59 75 62  Resp: '20  20 16  '$ Temp: 98.5 F (36.9 C)  98.2  F (36.8 C) 97.9 F (36.6 C)  TempSrc: Oral  Oral   SpO2: 96% 97% 98% 95%  Weight:      Height:       Body mass index is 27.44 kg/m. Gen:  WD/WN, NAD Head: [place]/AT, No temporalis wasting. Prominent temp pulse not noted. Ear/Nose/Throat: Hearing grossly intact, nares w/o erythema or drainage, oropharynx w/o Erythema/Exudate Eyes: Sclera non-icteric, conjunctiva clear Neck: Trachea midline.  No JVD.  Pulmonary:  Good air movement, respirations not labored, equal bilaterally.  Cardiac: RRR, normal S1, S2. Vascular:  Vessel Right Left  Radial Palpable Palpable  Ulnar Palpable Palpable                               Right Upper Extremity: Bruit and thrill noted. There is a soft hematoma as well as ecchymosis located just above the antecubital fossa.  Extremity is soft.  2+ radial pulse distally.  Motor / sensory is intact.  Gastrointestinal: soft, non-tender/non-distended. No guarding/reflex.  Musculoskeletal: M/S 5/5 throughout.  Extremities without ischemic changes.  No deformity or atrophy. No edema. Neurologic: Sensation grossly intact in extremities.  Symmetrical.  Speech is fluent. Motor exam as listed above. Psychiatric: Judgment intact, Mood & affect appropriate for pt's clinical situation. Dermatologic: No rashes or ulcers noted.  No cellulitis or open wounds. Lymph : No Cervical, Axillary, or Inguinal lymphadenopathy.  CBC Lab Results  Component Value Date   WBC 8.2 03/12/2021   HGB 9.9 (L) 03/12/2021   HCT 30.5 (L) 03/12/2021   MCV 93.3 03/12/2021   PLT 124 (L) 03/12/2021  BMET    Component Value Date/Time   NA 144 03/12/2021 0608   NA 141 11/07/2013 0535   K 3.9 03/12/2021 0608   K 4.0 11/07/2013 0535   CL 110 03/12/2021 0608   CL 111 (H) 11/07/2013 0535   CO2 28 03/12/2021 0608   CO2 25 11/07/2013 0535   GLUCOSE 123 (H) 03/12/2021 0608   GLUCOSE 82 11/07/2013 0535   BUN 35 (H) 03/12/2021 0608   BUN 44 (H) 11/07/2013 0535   CREATININE 2.53 (H)  03/12/2021 0608   CREATININE 1.81 (H) 11/07/2013 0535   CALCIUM 8.7 (L) 03/12/2021 0608   CALCIUM 8.4 (L) 11/07/2013 0535   GFRNONAA 18 (L) 03/12/2021 0608   GFRNONAA 26 (L) 11/07/2013 0535   GFRAA 23 (L) 01/09/2020 2054   GFRAA 30 (L) 11/07/2013 0535   Estimated Creatinine Clearance: 16.1 mL/min (A) (by C-G formula based on SCr of 2.53 mg/dL (H)).  COAG Lab Results  Component Value Date   INR 1.4 (H) 03/12/2021   INR 1.28 11/10/2015   INR 1.1 11/05/2013   Radiology [place] UPPER EXTREMITY ARTERIAL RIGHT LIMITED (GRAFT, SINGLE VESSEL)  Result Date: 03/12/2021 CLINICAL DATA:  Right upper extremity arteriovenous fistula, extensive ecchymosis EXAM: RIGHT UPPER EXTREMITY ARTERIAL DUPLEX SCAN TECHNIQUE: Gray-scale sonography as well as color Doppler and duplex ultrasound was performed to evaluate the arteries of the upper extremity. COMPARISON:  None. FINDINGS: Grayscale and color Doppler sonographic images were obtained of the right upper extremity dialysis fistula. Pulse Doppler sonographic evaluation was not performed. The images demonstrate a right upper extremity brachiocephalic fistula. The arteriovenous anastomosis appears stenotic with probable post not dilation of the a proximal portion of the fistula. Subsequently, there is marked aneurysmal dilation of the proximal portion of the fistula immediately proximal to a high-grade stenosis spanning roughly 2 cm. There is extensive tissue vibration within this region related to flow turbulence through the stenosis. There is moderate interstitial fluid surrounding the outflow cephalic vein centrally in keeping with perivascular hemorrhage. There is moderate subcutaneous edema within the visualized region of the right upper extremity. IMPRESSION: Right upper extremity brachiocephalic fistula. Probable tandem stenoses of the arteriovenous anastomosis as well as the proximal outflow cephalic vein resulting in marked aneurysmal dilatation of the proximal  portion of the fistula. Formal dialysis fistulagram is recommended for further evaluation. Electronically Signed   By: Fidela Salisbury MD   On: 03/12/2021 04:52   CUP PACEART REMOTE DEVICE CHECK  Result Date: 03/02/2021 Scheduled remote reviewed. 8 AHR events appears AT/AF, V rates appear controlled.  Known PAF.  Meds: Eliquis, Toprol XL.  Normal device function.  RP Next remote 91 days.  Assessment/Plan Denise [name] is a 87 y.o. Caucasian female with medical history significant for atrial fibrillation on Eliquis, stage IV chronic kidney disease, COPD, type 2 diabetes mellitus, emphysema, GERD, hypertension, osteoarthritis and hypothyroidism, who presented to the ER with acute onset of right upper extremity swelling and pain after lifting her dog and hearing something popping in her right arm.   1.  Right Upper Extremity Hematoma: Patient presented to the [facility] emergency room with a chief complaint of progressively worsening right upper extremity pain and swelling after lifting her dog and hearing a "popping" noise.  Patient is on Eliquis for A. fib. Patient had a brachiocephalic AV fistula creation a few years ago however recently was no longer needing dialysis. Right upper extremity venous duplex was notable for hematoma as well as stenosis of the AV anastomosis  as well as the proximal portion of the outflow cephalic vein. Physical exam today was notable for a peach sized hematoma however the rest of the extremity is warm and soft.  Hemoglobin and vital signs have been stable. At this time, do not feel the patient is actively bleeding however would hold off on restarting her Eliquis for another 24 hours.  Continue to cycle hemoglobin.  We will continue to follow along.  2.  Chronic Kidney Disease: Patient had a right brachiocephalic AV fistula creation in the past however is currently not using it due to her not needing dialysis. Patient recently seen by her nephrologist  Dr. Candiss Norse on June 03, 2020.  Would recommend continued follow-up   3.  Hyperlipidemia: On statin for medical management Encouraged good control as its slows the progression of atherosclerotic disease  Discussed with Dr. Mayme Genta, PA-C  03/12/2021 9:48 AM  This note was created with Dragon medical transcription system.  Any error is purely unintentional.

## 2021-03-12 NOTE — ED Notes (Signed)
Ace wrap to the affected extremity.

## 2021-03-12 NOTE — Progress Notes (Signed)
PHARMACY NOTE:  ANTIMICROBIAL RENAL DOSAGE ADJUSTMENT  Current antimicrobial regimen includes a mismatch between antimicrobial dosage and estimated renal function.  As per policy approved by the Pharmacy & Therapeutics and Medical Executive Committees, the antimicrobial dosage will be adjusted accordingly.  Current antimicrobial dosage:  Augmentin 875 mg q12h  Indication: sinusitis  Renal Function:  Estimated Creatinine Clearance: 16.1 mL/min (A) (by C-G formula based on SCr of 2.53 mg/dL (H)).  Antimicrobial dosage has been changed to:   Augmentin 500 mg q12h  Additional comments:   Thank you for allowing pharmacy to be a part of this patient's care.  Envi Eagleson A, [facility] 03/12/2021 3:20 PM

## 2021-03-12 NOTE — ED Provider Notes (Signed)
[facility] Emergency Department Provider Note  Time seen: 12:31 AM  I have reviewed the triage vital signs and the nursing notes.   HISTORY  Chief Complaint Arm Pain  HPI Denise [name] is a 87 y.o. female with a past medical history of atrial fibrillation on Eliquis, COPD, diabetes, gastric reflux, hypertension, CKD who presents to the emergency department for right upper extremity pain.  According to the patient around 11 PM tonight she lifted up her small dog and felt a pop in her right arm.  States approximate 30 minutes later noted increased pain and large swelling to the area of an AV fistula.  Patient states she has an AV fistula to the right upper extremity but has not been used for dialysis previously.  It appears that this was placed in 2017 by Dr. Lucky Cowboy per record review.  Patient describes the pain as moderate to severe in the right upper extremity with significant swelling to the area.   Past Medical History:  Diagnosis Date  . A-fib ([place])   . Arthritis   . Cataract   . Chronic kidney disease   . COPD (chronic obstructive pulmonary disease) ([place])   . Diabetes mellitus without complication ([place])   . Emphysema of lung ([place])   . GERD (gastroesophageal reflux disease)   . Hypertension   . Hypothyroidism     Patient Active Problem List   Diagnosis Date Noted  . Sinus pause 11/30/2019  . PAF (paroxysmal atrial fibrillation) ([place]) 11/30/2019  . Atrial fibrillation with RVR ([place]) 11/28/2019  . ESRD on dialysis ([place]) 04/11/2017  . Hyperlipidemia 01/05/2017  . Diabetes mellitus type 2 with complications ([place]) [id number]  . Chronic kidney disease 01/05/2017  . Intertrochanteric fracture of right hip ([place]) 08/03/2015  . Hypoglycemia secondary to sulfonylurea 07/29/2015    Past Surgical History:  Procedure Laterality Date  . AV FISTULA PLACEMENT Right 11/18/2015   Procedure: ARTERIOVENOUS (AV) FISTULA CREATION;  Surgeon: Algernon Huxley, MD;  Location:  [facility] ORS;  Service: Vascular;  Laterality: Right;  . CHOLECYSTECTOMY    . GALLBLADDER SURGERY    . HIP ARTHROPLASTY Right 07/31/2015   Procedure: ARTHROPLASTY BIPOLAR HIP (HEMIARTHROPLASTY);  Surgeon: Corky Mull, MD;  Location: [facility] ORS;  Service: Orthopedics;  Laterality: Right;  . PACEMAKER IMPLANT N/A 11/29/2019   Procedure: PACEMAKER IMPLANT;  Surgeon: Constance Haw, MD;  Location: [place] [facility];  Service: Cardiovascular;  Laterality: N/A;  . TEMPORARY PACEMAKER N/A 11/29/2019   Procedure: TEMPORARY PACEMAKER;  Surgeon: Isaias Cowman, MD;  Location: [place] [facility];  Service: Cardiovascular;  Laterality: N/A;    Prior to Admission medications   Medication Sig Start Date End Date Taking? Authorizing Provider  acetaminophen (TYLENOL) 325 MG tablet Take 1-2 tablets (325-650 mg total) by mouth every 4 (four) hours as needed for mild pain (temp > 101.5). 11/29/19   Awilda Bill, NP  allopurinol (ZYLOPRIM) 100 MG tablet Take 100 mg by mouth every evening. 11/20/18   [provider]  ascorbic acid (VITAMIN C) 250 MG tablet Take 250 mg by mouth daily after lunch.    [provider]  atorvastatin (LIPITOR) 40 MG tablet Take 40 mg by mouth every morning. 09/08/14   [provider]  ELIQUIS 2.5 MG TABS tablet TAKE 1 TABLET BY MOUTH TWICE DAILY 12/29/20   Camnitz, Ocie Doyne, MD  furosemide (LASIX) 40 MG tablet Take 40 mg by mouth every morning. 05/11/16   [provider]  hydrocortisone  cream 1 % Apply 1 application topically 2 (two) times daily. [place] CREAM    [provider]  insulin degludec (TRESIBA FLEXTOUCH) 100 UNIT/ML SOPN FlexTouch Pen Inject 24.5 Units into the skin every evening. 11/16/18   [provider]  levothyroxine (SYNTHROID) 100 MCG tablet Take 100 mcg by mouth daily before breakfast.  07/16/19   [provider]  metoprolol succinate (TOPROL-XL) 100 MG 24 hr tablet Take 1.5 tablets (150 mg  total) by mouth daily. Take with or immediately following a meal. 03/19/20   Camnitz, Ocie Doyne, MD  nystatin cream (MYCOSTATIN) Apply 1 application topically 2 (two) times daily. [place] WITH HYDROCORTISONE 08/26/19   [provider]  pantoprazole (PROTONIX) 40 MG tablet Take 40 mg by mouth every morning. 11/30/15   [provider]  tiotropium (SPIRIVA HANDIHALER) 18 MCG inhalation capsule Place 1 capsule into inhaler and inhale daily after lunch. 01/22/10   [provider]  valsartan (DIOVAN) 80 MG tablet Take 80 mg by mouth every morning. 08/20/14   [provider]  White Petrolatum-Mineral Oil (SYSTANE NIGHTTIME) OINT Place 1 application into both eyes at bedtime as needed for dry eyes. 06/10/19   [provider]    Allergies  Allergen Reactions  . Iodinated Diagnostic Agents Anaphylaxis, Other (See Comments) and Rash  . Benzocaine-Chloroxylenol-Hc     Years ago  . Benzonatate Rash    Note: pruritis    Family History  Problem Relation Age of Onset  . Diabetes Mother   . Heart attack Mother   . Stomach cancer Father   . Colon cancer Sister   . Diabetes Sister   . Heart attack Brother   . Colon cancer Brother   . Breast cancer Neg Hx     Social History Social History   Tobacco Use  . Smoking status: Former Smoker    Quit date: 07/28/2013    Years since quitting: 7.6  . Smokeless tobacco: Never Used  Substance Use Topics  . Alcohol use: No  . Drug use: No    Review of Systems Constitutional: Negative for fever. Cardiovascular: Negative for chest pain. Respiratory: Negative for shortness of breath. Gastrointestinal: Negative for abdominal pain Musculoskeletal: Right upper extremity pain and swelling. Neurological: Negative for headache All other ROS negative  ____________________________________________   PHYSICAL EXAM:  VITAL SIGNS: ED Triage Vitals  Enc Vitals Group     BP 03/11/21 2350 (!) 168/69     Pulse Rate 03/11/21  2350 67     Resp 03/11/21 2350 20     Temp 03/11/21 2350 98.5 F (36.9 C)     Temp Source 03/11/21 2350 Oral     SpO2 03/11/21 2350 96 %     Weight 03/11/21 2348 170 lb (77.1 kg)     Height 03/11/21 2348 '5\' 6"'$  (1.676 m)     Head Circumference --      Peak Flow --      Pain Score 03/11/21 2348 8     Pain Loc --      Pain Edu? --      Excl. in [place]? --    Constitutional: Alert and oriented. Well appearing and in no distress. Eyes: Normal exam ENT      Head: Normocephalic and atraumatic.      Mouth/Throat: Mucous membranes are moist. Cardiovascular: Normal rate, regular rhythm.  Respiratory: Normal respiratory effort without tachypnea nor retractions. Breath sounds are clear Gastrointestinal: Soft and nontender. No distention. Musculoskeletal: Nontender with normal  range of motion in all extremities.  Neurologic:  Normal speech and language. No gross focal neurologic deficits  Skin:  Skin is warm, dry and intact.  Psychiatric: Mood and affect are normal.   ____________________________________________   RADIOLOGY  Ultrasound pending  ____________________________________________   INITIAL IMPRESSION / ASSESSMENT AND PLAN / ED COURSE  Pertinent labs & imaging results that were available during my care of the patient were reviewed by me and considered in my medical decision making (see chart for details).   Patient presents emergency department for right upper remedy pain and swelling in the area where she has an AV fistula which is not being used for patient.  Patient is on Eliquis for paroxysmal atrial fibrillation.  On examination patient has a very large area of swelling to the right upper extremity/bicep area consistent with hematoma as well as a thrill over the AV fistula.  Highly suspect leaking fistula on physical exam.  We will discussed with vascular surgery.  We will treat pain and check labs and continue to closely monitor.  Appears to be neurovascular intact distally in  the arm.  Spoke with Dr. Lucky Cowboy who recommends wrapping the upper extremity and Ace bandage for compression.  We will continue to closely monitor as the patient is on Eliquis and a large hematoma we will admit to the hospitalist service to monitor H&H and for pain control vascular surgery will see tomorrow.  We will check labs and ultrasound and continue with pain management.  ROKSANA KILIAN was evaluated in Emergency Department on 03/12/2021 for the symptoms described in the history of present illness. She was evaluated in the context of the global COVID-19 pandemic, which necessitated consideration that the patient might be at risk for infection with the SARS-CoV-2 virus that causes COVID-19. Institutional protocols and algorithms that pertain to the evaluation of patients at risk for COVID-19 are in a state of rapid change based on information released by regulatory bodies including the CDC and federal and state organizations. These policies and algorithms were followed during the patient's care in the ED.  ____________________________________________   FINAL CLINICAL IMPRESSION(S) / ED DIAGNOSES  Leaking AV fistula Hematoma   Harvest Dark, MD 03/12/21 [phone]

## 2021-03-12 NOTE — ED Notes (Signed)
Message sent to Dub Mikes, RN for handoff.

## 2021-03-12 NOTE — H&P (Addendum)
Denise [name]   PATIENT NAME: Denise [name]    MR#:  [id number]  DATE OF BIRTH:  12-Feb-1930  DATE OF ADMISSION:  03/11/2021  PRIMARY CARE PHYSICIAN: Perrin Maltese, MD   Patient is coming from: Home.  REQUESTING/REFERRING PHYSICIAN: Harvest Dark, MD  CHIEF COMPLAINT:   Chief Complaint  Patient presents with  . Arm Pain    HISTORY OF PRESENT ILLNESS:  Denise [name] is a 87 y.o. Caucasian female with medical history significant for atrial fibrillation on Eliquis, stage IV chronic kidney disease, COPD, type 2 diabetes mellitus, emphysema, GERD, hypertension, osteoarthritis and hypothyroidism, who presented to the ER with acute onset of right upper extremity swelling and pain after lifting her dog and hearing something popping in her right arm.  This happened at the site of the right upper extremity fistula that was done in 2017 however the patient has not had any dialysis since then.  She denies any fevers or chills.  No nausea or vomiting.  She has been having cough all week with clear expectoration without significant dyspnea or wheezing.  She denies any fever or chills.  She admits to nasal congestion and sinus pressure.  She has occasional headache without dizziness or blurred vision.  ED Course: When she came to the ER blood pressure was 168/69 with otherwise normal vital signs.  Labs revealed a BUN of 35 and creatinine 2.65 above her previous creatinine of 2.1 with albumin of 3.3 and total protein 6.1.  CBC showed anemia close to baseline.  Influenza antigens and COVID-19 PCR came back negative.    Imaging: Right upper extremity ultrasound was ordered and is currently pending.  The patient was given 50 mcg of IV fentanyl.  Contact was made with Dr. Laurence Compton who recommended wrapping the right arm and observation.  She will be admitted to an observation medical bed for further evaluation and management. PAST MEDICAL HISTORY:   Past Medical History:  Diagnosis Date  . A-fib  ([place])   . Arthritis   . Cataract   . Chronic kidney disease   . COPD (chronic obstructive pulmonary disease) ([place])   . Diabetes mellitus without complication ([place])   . Emphysema of lung ([place])   . GERD (gastroesophageal reflux disease)   . Hypertension   . Hypothyroidism     PAST SURGICAL HISTORY:   Past Surgical History:  Procedure Laterality Date  . AV FISTULA PLACEMENT Right 11/18/2015   Procedure: ARTERIOVENOUS (AV) FISTULA CREATION;  Surgeon: Algernon Huxley, MD;  Location: [facility] ORS;  Service: Vascular;  Laterality: Right;  . CHOLECYSTECTOMY    . GALLBLADDER SURGERY    . HIP ARTHROPLASTY Right 07/31/2015   Procedure: ARTHROPLASTY BIPOLAR HIP (HEMIARTHROPLASTY);  Surgeon: Corky Mull, MD;  Location: [facility] ORS;  Service: Orthopedics;  Laterality: Right;  . PACEMAKER IMPLANT N/A 11/29/2019   Procedure: PACEMAKER IMPLANT;  Surgeon: Constance Haw, MD;  Location: [facility];  Service: Cardiovascular;  Laterality: N/A;  . TEMPORARY PACEMAKER N/A 11/29/2019   Procedure: TEMPORARY PACEMAKER;  Surgeon: Isaias Cowman, MD;  Location: [facility];  Service: Cardiovascular;  Laterality: N/A;    SOCIAL HISTORY:   Social History   Tobacco Use  . Smoking status: Former Smoker    Quit date: 07/28/2013    Years since quitting: 7.6  . Smokeless tobacco: Never Used  Substance Use Topics  . Alcohol use: No    FAMILY HISTORY:   Family History  Problem Relation Age  of Onset  . Diabetes Mother   . Heart attack Mother   . Stomach cancer Father   . Colon cancer Sister   . Diabetes Sister   . Heart attack Brother   . Colon cancer Brother   . Breast cancer Neg Hx     DRUG ALLERGIES:   Allergies  Allergen Reactions  . Iodinated Diagnostic Agents Anaphylaxis, Other (See Comments) and Rash  . Benzocaine-Chloroxylenol-Hc     Years ago  . Benzonatate Rash    Note: pruritis    REVIEW OF SYSTEMS:   ROS As per history of present illness. All pertinent systems  were reviewed above. Constitutional, HEENT, cardiovascular, respiratory, GI, GU, musculoskeletal, neuro, psychiatric, endocrine, integumentary and hematologic systems were reviewed and are otherwise negative/unremarkable except for positive findings mentioned above in the HPI.   MEDICATIONS AT HOME:   Prior to Admission medications   Medication Sig Start Date End Date Taking? Authorizing Provider  acetaminophen (TYLENOL) 325 MG tablet Take 1-2 tablets (325-650 mg total) by mouth every 4 (four) hours as needed for mild pain (temp > 101.5). 11/29/19   Awilda Bill, NP  allopurinol (ZYLOPRIM) 100 MG tablet Take 100 mg by mouth every evening. 11/20/18   [provider]  ascorbic acid (VITAMIN C) 250 MG tablet Take 250 mg by mouth daily after lunch.    [provider]  atorvastatin (LIPITOR) 40 MG tablet Take 40 mg by mouth every morning. 09/08/14   [provider]  ELIQUIS 2.5 MG TABS tablet TAKE 1 TABLET BY MOUTH TWICE DAILY 12/29/20   Camnitz, Ocie Doyne, MD  furosemide (LASIX) 40 MG tablet Take 40 mg by mouth every morning. 05/11/16   [provider]  hydrocortisone cream 1 % Apply 1 application topically 2 (two) times daily. [place] CREAM    [provider]  insulin degludec (TRESIBA FLEXTOUCH) 100 UNIT/ML SOPN FlexTouch Pen Inject 24.5 Units into the skin every evening. 11/16/18   [provider]  levothyroxine (SYNTHROID) 100 MCG tablet Take 100 mcg by mouth daily before breakfast.  07/16/19   [provider]  metoprolol succinate (TOPROL-XL) 100 MG 24 hr tablet Take 1.5 tablets (150 mg total) by mouth daily. Take with or immediately following a meal. 03/19/20   Camnitz, Ocie Doyne, MD  nystatin cream (MYCOSTATIN) Apply 1 application topically 2 (two) times daily. [place] WITH HYDROCORTISONE 08/26/19   [provider]  pantoprazole (PROTONIX) 40 MG tablet Take 40 mg by mouth every morning. 11/30/15   [provider]   tiotropium (SPIRIVA HANDIHALER) 18 MCG inhalation capsule Place 1 capsule into inhaler and inhale daily after lunch. 01/22/10   [provider]  valsartan (DIOVAN) 80 MG tablet Take 80 mg by mouth every morning. 08/20/14   [provider]  White Petrolatum-Mineral Oil (SYSTANE NIGHTTIME) OINT Place 1 application into both eyes at bedtime as needed for dry eyes. 06/10/19   [provider]      VITAL SIGNS:  Blood pressure (!) 168/69, pulse 67, temperature 98.5 F (36.9 C), temperature source Oral, resp. rate 20, height '5\' 6"'$  (1.676 m), weight 77.1 kg, SpO2 96 %.  PHYSICAL EXAMINATION:  Physical Exam  GENERAL:  87 y.o.-year-old Caucasian female patient lying in the bed with no acute distress.  EYES: Pupils equal, round, reactive to light and accommodation. No scleral icterus. Extraocular muscles intact.  HEENT: Head atraumatic, normocephalic. Oropharynx and nasopharynx clear.  NECK:  Supple, no jugular venous distention. No thyroid enlargement, no tenderness.  LUNGS: Normal breath sounds bilaterally, no wheezing, rales,rhonchi or crepitation. No use of accessory muscles of respiration.  CARDIOVASCULAR: Regular rate and rhythm, S1, S2 normal. No murmurs, rubs, or gallops.  She has a right arm AV fistula and associated significant swelling with mild tenderness. ABDOMEN: Soft, nondistended, nontender. Bowel sounds present. No organomegaly or mass.  EXTREMITIES: No pedal edema, cyanosis, or clubbing.  NEUROLOGIC: Cranial nerves II through XII are intact. Muscle strength 5/5 in all extremities. Sensation intact. Gait not checked.  PSYCHIATRIC: The patient is alert and oriented x 3.  Normal affect and good eye contact. SKIN: No obvious rash, lesion, or ulcer.   LABORATORY PANEL:   CBC Recent Labs  Lab 03/12/21 0037  WBC 8.7  HGB 9.8*  HCT 30.1*  PLT 136*    ------------------------------------------------------------------------------------------------------------------  Chemistries  Recent Labs  Lab 03/12/21 0037  NA 143  K 4.1  CL 109  CO2 25  GLUCOSE 139*  BUN 35*  CREATININE 2.65*  CALCIUM 8.4*  AST 21  ALT 11  ALKPHOS 111  BILITOT 0.8   ------------------------------------------------------------------------------------------------------------------  Cardiac Enzymes No results for input(s): TROPONINI in the last 168 hours. ------------------------------------------------------------------------------------------------------------------  RADIOLOGY:  No results found.    IMPRESSION AND PLAN:  Active Problems:   Intramural hematoma of artery of right upper extremity  1.  Suspected right upper extremity hematoma at her AV fistula. - The patient will be admitted to an observation medical bed. - RUE hematomas Ace wrap. - Her Eliquis will be held off. - Vascular surgery consult will be obtained. - Dr. Lucky Cowboy was notified about the patient. - Pain management will be provided and cold compresses.  2.  Acute sinusitis. - We will place her on p.o. Augmentin and Flonase nasal spray as well as Mucinex.  3.  Mild acute kidney injury superimposed on stage IV chronic kidney disease. - She will be gently hydrated with IV normal saline and will follow BMP.  4.  Uncontrolled hypertension. - We will continue Toprol-XL and place the patient on as needed IV hydralazine.  5.  Dyslipidemia. - We will continue statin therapy.  6.  Dementia and depression. - We will continue Aricept and Lexapro.  7.  Type 2 diabetes mellitus. - The patient will be placed on supplemental coverage with NovoLog and continue Amaryl.  8.  History of paroxysmal atrial fibrillation on Eliquis. - Eliquis is held given her hematoma as mentioned above.  DVT prophylaxis: SCDs.  Medical prophylaxis currently current indicated due to her significant right  arm hematoma Code Status: full code.   Family Communication:  The plan of care was discussed in details with the patient (who requested no other family members to be notified). I answered all questions. The patient agreed to proceed with the above mentioned plan. Further management will depend upon hospital course. Disposition Plan: Back to previous home environment Consults called: Vascular surgery consult. All the records are reviewed and case discussed with ED provider.  Status is: Observation  The patient remains OBS appropriate and will d/c before 2 midnights.  Dispo: The patient is from: Home              Anticipated d/c is to: Home              Patient currently is not medically stable to d/c.   Difficult to place patient No   TOTAL TIME TAKING CARE OF THIS PATIENT: 55 minutes.    Christel Mormon M.D on 03/12/2021 at 3:13 AM  [facility]   From 7 PM-7 AM, contact night-coverage [email or web address]  CC: Primary care physician; Perrin Maltese, MD

## 2021-03-12 NOTE — ED Notes (Signed)
ED Provider at bedside. 

## 2021-03-12 NOTE — ED Notes (Signed)
Patient is resting comfortably. 

## 2021-03-12 NOTE — ED Notes (Signed)
[place] just completed study.

## 2021-03-13 ENCOUNTER — Inpatient Hospital Stay: Payer: Medicare Other

## 2021-03-13 ENCOUNTER — Observation Stay: Payer: Medicare Other

## 2021-03-13 DIAGNOSIS — M2550 Pain in unspecified joint: Secondary | ICD-10-CM | POA: Diagnosis not present

## 2021-03-13 DIAGNOSIS — T82898A Other specified complication of vascular prosthetic devices, implants and grafts, initial encounter: Secondary | ICD-10-CM | POA: Diagnosis not present

## 2021-03-13 DIAGNOSIS — N2581 Secondary hyperparathyroidism of renal origin: Secondary | ICD-10-CM | POA: Diagnosis present

## 2021-03-13 DIAGNOSIS — M6281 Muscle weakness (generalized): Secondary | ICD-10-CM | POA: Diagnosis not present

## 2021-03-13 DIAGNOSIS — M7989 Other specified soft tissue disorders: Secondary | ICD-10-CM

## 2021-03-13 DIAGNOSIS — E1122 Type 2 diabetes mellitus with diabetic chronic kidney disease: Secondary | ICD-10-CM | POA: Diagnosis present

## 2021-03-13 DIAGNOSIS — N189 Chronic kidney disease, unspecified: Secondary | ICD-10-CM | POA: Diagnosis not present

## 2021-03-13 DIAGNOSIS — Z20822 Contact with and (suspected) exposure to covid-19: Secondary | ICD-10-CM | POA: Diagnosis present

## 2021-03-13 DIAGNOSIS — R531 Weakness: Secondary | ICD-10-CM | POA: Diagnosis not present

## 2021-03-13 DIAGNOSIS — M79601 Pain in right arm: Secondary | ICD-10-CM | POA: Diagnosis not present

## 2021-03-13 DIAGNOSIS — S45991A Other specified injury of unspecified blood vessel at shoulder and upper arm level, right arm, initial encounter: Secondary | ICD-10-CM | POA: Diagnosis not present

## 2021-03-13 DIAGNOSIS — N179 Acute kidney failure, unspecified: Secondary | ICD-10-CM | POA: Diagnosis present

## 2021-03-13 DIAGNOSIS — R609 Edema, unspecified: Secondary | ICD-10-CM | POA: Diagnosis not present

## 2021-03-13 DIAGNOSIS — Z7901 Long term (current) use of anticoagulants: Secondary | ICD-10-CM | POA: Diagnosis not present

## 2021-03-13 DIAGNOSIS — E1169 Type 2 diabetes mellitus with other specified complication: Secondary | ICD-10-CM | POA: Diagnosis not present

## 2021-03-13 DIAGNOSIS — R6889 Other general symptoms and signs: Secondary | ICD-10-CM | POA: Diagnosis not present

## 2021-03-13 DIAGNOSIS — J449 Chronic obstructive pulmonary disease, unspecified: Secondary | ICD-10-CM | POA: Diagnosis not present

## 2021-03-13 DIAGNOSIS — F0390 Unspecified dementia without behavioral disturbance: Secondary | ICD-10-CM | POA: Diagnosis present

## 2021-03-13 DIAGNOSIS — R52 Pain, unspecified: Secondary | ICD-10-CM | POA: Diagnosis not present

## 2021-03-13 DIAGNOSIS — K219 Gastro-esophageal reflux disease without esophagitis: Secondary | ICD-10-CM | POA: Diagnosis present

## 2021-03-13 DIAGNOSIS — G4700 Insomnia, unspecified: Secondary | ICD-10-CM | POA: Diagnosis present

## 2021-03-13 DIAGNOSIS — R600 Localized edema: Secondary | ICD-10-CM | POA: Diagnosis not present

## 2021-03-13 DIAGNOSIS — N184 Chronic kidney disease, stage 4 (severe): Secondary | ICD-10-CM | POA: Diagnosis present

## 2021-03-13 DIAGNOSIS — I129 Hypertensive chronic kidney disease with stage 1 through stage 4 chronic kidney disease, or unspecified chronic kidney disease: Secondary | ICD-10-CM | POA: Diagnosis present

## 2021-03-13 DIAGNOSIS — S45991D Other specified injury of unspecified blood vessel at shoulder and upper arm level, right arm, subsequent encounter: Secondary | ICD-10-CM | POA: Diagnosis not present

## 2021-03-13 DIAGNOSIS — M109 Gout, unspecified: Secondary | ICD-10-CM | POA: Diagnosis present

## 2021-03-13 DIAGNOSIS — H04123 Dry eye syndrome of bilateral lacrimal glands: Secondary | ICD-10-CM | POA: Diagnosis not present

## 2021-03-13 DIAGNOSIS — R001 Bradycardia, unspecified: Secondary | ICD-10-CM | POA: Diagnosis not present

## 2021-03-13 DIAGNOSIS — M1990 Unspecified osteoarthritis, unspecified site: Secondary | ICD-10-CM | POA: Diagnosis present

## 2021-03-13 DIAGNOSIS — Z736 Limitation of activities due to disability: Secondary | ICD-10-CM | POA: Diagnosis not present

## 2021-03-13 DIAGNOSIS — R404 Transient alteration of awareness: Secondary | ICD-10-CM | POA: Diagnosis not present

## 2021-03-13 DIAGNOSIS — Y832 Surgical operation with anastomosis, bypass or graft as the cause of abnormal reaction of the patient, or of later complication, without mention of misadventure at the time of the procedure: Secondary | ICD-10-CM | POA: Diagnosis present

## 2021-03-13 DIAGNOSIS — E785 Hyperlipidemia, unspecified: Secondary | ICD-10-CM | POA: Diagnosis present

## 2021-03-13 DIAGNOSIS — F32A Depression, unspecified: Secondary | ICD-10-CM | POA: Diagnosis present

## 2021-03-13 DIAGNOSIS — R2681 Unsteadiness on feet: Secondary | ICD-10-CM | POA: Diagnosis not present

## 2021-03-13 DIAGNOSIS — T148XXA Other injury of unspecified body region, initial encounter: Secondary | ICD-10-CM | POA: Diagnosis not present

## 2021-03-13 DIAGNOSIS — D631 Anemia in chronic kidney disease: Secondary | ICD-10-CM | POA: Diagnosis present

## 2021-03-13 DIAGNOSIS — T82590A Other mechanical complication of surgically created arteriovenous fistula, initial encounter: Secondary | ICD-10-CM | POA: Diagnosis not present

## 2021-03-13 DIAGNOSIS — Z96641 Presence of right artificial hip joint: Secondary | ICD-10-CM | POA: Diagnosis present

## 2021-03-13 DIAGNOSIS — M7981 Nontraumatic hematoma of soft tissue: Secondary | ICD-10-CM | POA: Diagnosis present

## 2021-03-13 DIAGNOSIS — J439 Emphysema, unspecified: Secondary | ICD-10-CM | POA: Diagnosis present

## 2021-03-13 DIAGNOSIS — I10 Essential (primary) hypertension: Secondary | ICD-10-CM | POA: Diagnosis not present

## 2021-03-13 DIAGNOSIS — I480 Paroxysmal atrial fibrillation: Secondary | ICD-10-CM | POA: Diagnosis present

## 2021-03-13 DIAGNOSIS — N281 Cyst of kidney, acquired: Secondary | ICD-10-CM | POA: Diagnosis not present

## 2021-03-13 DIAGNOSIS — E039 Hypothyroidism, unspecified: Secondary | ICD-10-CM | POA: Diagnosis present

## 2021-03-13 DIAGNOSIS — Z743 Need for continuous supervision: Secondary | ICD-10-CM | POA: Diagnosis not present

## 2021-03-13 DIAGNOSIS — J019 Acute sinusitis, unspecified: Secondary | ICD-10-CM | POA: Diagnosis present

## 2021-03-13 DIAGNOSIS — E569 Vitamin deficiency, unspecified: Secondary | ICD-10-CM | POA: Diagnosis not present

## 2021-03-13 DIAGNOSIS — Z7401 Bed confinement status: Secondary | ICD-10-CM | POA: Diagnosis not present

## 2021-03-13 DIAGNOSIS — M1000 Idiopathic gout, unspecified site: Secondary | ICD-10-CM | POA: Diagnosis not present

## 2021-03-13 LAB — BASIC METABOLIC PANEL
Anion gap: 7 (ref 5–15)
BUN: 32 mg/dL — ABNORMAL HIGH (ref 8–23)
CO2: 26 mmol/L (ref 22–32)
Calcium: 8.3 mg/dL — ABNORMAL LOW (ref 8.9–10.3)
Chloride: 111 mmol/L (ref 98–111)
Creatinine, Ser: 2.28 mg/dL — ABNORMAL HIGH (ref 0.44–1.00)
GFR, Estimated: 20 mL/min — ABNORMAL LOW (ref >60)
Glucose, Bld: 76 mg/dL (ref 70–99)
Potassium: 3.4 mmol/L — ABNORMAL LOW (ref 3.5–5.1)
Sodium: 144 mmol/L (ref 135–145)

## 2021-03-13 LAB — GLUCOSE, CAPILLARY
Glucose-Capillary: 106 mg/dL — ABNORMAL HIGH (ref 70–99)
Glucose-Capillary: 87 mg/dL (ref 70–99)
Glucose-Capillary: 159 mg/dL — ABNORMAL HIGH (ref 70–99)

## 2021-03-13 LAB — CBC
HCT: 29.1 % — ABNORMAL LOW (ref 36.0–46.0)
Hemoglobin: 9.4 g/dL — ABNORMAL LOW (ref 12.0–15.0)
MCH: 30.1 pg (ref 26.0–34.0)
MCHC: 32.3 g/dL (ref 30.0–36.0)
MCV: 93.3 fL (ref 80.0–100.0)
Platelets: 139 10*3/uL — ABNORMAL LOW (ref 150–400)
RBC: 3.12 MIL/uL — ABNORMAL LOW (ref 3.87–5.11)
RDW: 13.9 % (ref 11.5–15.5)
WBC: 10 10*3/uL (ref 4.0–10.5)
nRBC: 0 % (ref 0.0–0.2)

## 2021-03-13 LAB — MAGNESIUM: Magnesium: 2 mg/dL (ref 1.7–2.4)

## 2021-03-13 MED ORDER — MORPHINE SULFATE (PF) 2 MG/ML IV SOLN
2.0000 mg | INTRAVENOUS | Status: DC | PRN
  Administered 2021-03-13 – 2021-03-14 (×3): 2 mg via INTRAVENOUS
  Filled 2021-03-13 (×3): qty 1

## 2021-03-13 MED ORDER — POTASSIUM CHLORIDE CRYS ER 20 MEQ PO TBCR
20.0000 meq | EXTENDED_RELEASE_TABLET | Freq: Once | ORAL | Status: AC
  Administered 2021-03-13: 20 meq via ORAL
  Filled 2021-03-13: qty 1

## 2021-03-13 NOTE — Progress Notes (Signed)
Patient daughter called to ask about what is going on with patients care. She mentions that patient has dementia but then states she has not gone to the doctor nor does she take any medication for if because she refuses to go to the doctor. States she is worried about patient because she is forgetting things and not able to care for herself.  Daughter would like to speak with a Doctor about the patient and plans.

## 2021-03-13 NOTE — Plan of Care (Signed)
Continuing with plan of care. 

## 2021-03-13 NOTE — TOC Initial Note (Addendum)
Transition of Care [facility]) - Initial/Assessment Note    Patient Details  Name: Denise [name] MRN: [id number] Date of Birth: [birth year]  Transition [facility]) CM/SW Contact:    Magnus Ivan, LCSW Phone Number: 03/13/2021, 10:04 AM  Clinical Narrative:                CSW spoke with patient. Patient reported she lives alone in the same apartment complex as her daughter. Daughter provides transportation. PCP is Dr.Khan. Pharmacy is [facility]. Patient has a RW, wheelchair, shower chair, and bedside commode. Patient reported she has a nurse come out to the house, thinks it may be home health services, but unsure of agency. CSW checked [facility] and it appears she may be active with [facility]- CSW reached out to Representative [place] to inquire if they are still active. TOC will continue to follow for needs.  12:35- Per [place], patient is not active for home health with [facility]. Notified by Linford Arnold that patient is active with them for Outpatient Palliative Care.  Expected Discharge Plan: [place] Barriers to Discharge: Continued Medical Work up   Patient Goals and CMS Choice Patient states their goals for this hospitalization and ongoing recovery are:: to return home CMS Medicare.gov Compare Post Acute Care list provided to:: Patient Choice offered to / list presented to : Patient  Expected Discharge Plan and Services Expected Discharge Plan: [place]       Living arrangements for the past 2 months: Apartment                                       Prior Living Arrangements/Services Living arrangements for the past 2 months: Apartment Lives with:: Self Patient language and need for interpreter reviewed:: Yes Do you feel safe going back to the place where you live?: Yes      Need for Family Participation in Patient Care: Yes (Comment) Care giver support system in place?: Yes (comment) Current home  services: DME Criminal Activity/Legal Involvement Pertinent to Current Situation/Hospitalization: No - Comment as needed  Activities of Daily Living Home Assistive Devices/Equipment: Cane (specify quad or straight),Wheelchair,Walker (specify type),Shower chair with back ADL Screening (condition at time of admission) Patient's cognitive ability adequate to safely complete daily activities?: Yes Is the patient deaf or have difficulty hearing?: Yes Does the patient have difficulty seeing, even when wearing glasses/contacts?: No Does the patient have difficulty concentrating, remembering, or making decisions?: Yes Patient able to express need for assistance with ADLs?: Yes Does the patient have difficulty dressing or bathing?: Yes Independently performs ADLs?: Yes (appropriate for developmental age) Does the patient have difficulty walking or climbing stairs?: Yes Weakness of Legs: Right Weakness of Arms/Hands: Right  Permission Sought/Granted Permission sought to share information with : Facility Retail banker granted to share information with : Yes, Verbal Permission Granted     Permission granted to share info w AGENCY: [facility], DME agencies as needed  Permission granted to share info w Relationship: daughter     Emotional Assessment       Orientation: : Oriented to Self,Oriented to Place,Oriented to  Time,Oriented to Situation Alcohol / Substance Use: Not Applicable Psych Involvement: No (comment)  Admission diagnosis:  Hematoma [T14.8XXA] Intramural hematoma of artery of right upper extremity [S45.991A] Patient Active Problem List   Diagnosis Date Noted  . Intramural  hematoma of artery of right upper extremity 03/12/2021  . Hematoma   . Sinus pause 11/30/2019  . PAF (paroxysmal atrial fibrillation) ([place]) 11/30/2019  . Atrial fibrillation with RVR ([place]) 11/28/2019  . ESRD on dialysis ([place]) 04/11/2017  . Hyperlipidemia 01/05/2017  . Diabetes  mellitus type 2 with complications ([place]) [id number]  . Chronic kidney disease 01/05/2017  . Intertrochanteric fracture of right hip ([place]) 08/03/2015  . Hypoglycemia secondary to sulfonylurea 07/29/2015   PCP:  Perrin Maltese, MD Pharmacy:   [facility], [place] [address] Phone: [phone] Fax: [phone]  [place], [place] [address] [place] [address] Phone: [phone] Fax: [phone]     Social Determinants of Health (SDOH) Interventions    Readmission Risk Interventions No flowsheet data found.

## 2021-03-13 NOTE — Progress Notes (Signed)
PROGRESS NOTE    Denise [name]  [id number] DOB: 1930/06/14 DOA: 03/11/2021 PCP: Perrin Maltese, MD   Brief Narrative: Taken from H&P. Denise Cassells Mooreis a 87 y.o.Caucasian femalewith medical history significant foratrial fibrillation on Eliquis, stage IV chronic kidney disease, COPD, type2diabetes mellitus, emphysema, GERD, hypertension, osteoarthritis and hypothyroidism, who presented to the ER with acute onset ofright upper extremity swelling and pain after lifting her dog and hearing something popping in her right arm. This happened at the site of the right upper extremity fistula that was done in 2017 however the patient has not had any dialysis since then.  Found to have a hematoma at fistula site.  Vascular surgery was also consulted.  Venous Doppler studies were negative for DVT.  Significant worsening of edema and bruising involving right upper extremity.  No sign of compartment syndrome at this time.  Vascular surgery would like to keep managing conservatively.  They were recommending doing a fistulogram which can not be done over the weekend.  CT right upper extremity ordered as advised by vascular surgery today.  Subjective: Patient was having worsening of bruising and edema along with right upper extremity pain.  She was very frustrated as we were unable to explain the etiology at this time. Daughter at bedside.  Assessment & Plan:   Active Problems:   Intramural hematoma of artery of right upper extremity   Hematoma  Intramural hematoma of right upper extremity.  Clinically seems worsened.  Per vascular surgery this is not a presentation for just for hematoma.  Venous Doppler studies were negative for DVT.  They were suspecting venous blockage versus stenosis at fistula site and she will need a fistulogram which cannot be done over the weekend. -Continue with conservative management -Continue to monitor for any sign of compartment syndrome. -CT right upper extremity as  advised by vascular surgery. -Continue with pain management  AKI with CKD stage IV.  Creatinine now at baseline. -Monitor renal function -Avoid nephrotoxins  Hypertension.  Blood pressure within goal. -Continue with Toprol-XL, irbesartan and as needed IV hydralazine  Dyslipidemia. -Continue with home dose of statin.  Type 2 diabetes mellitus.  A1c of 7.  CBG within goal -Continue with SSI -Continue with Lantus 25 units at bedtime  Acute sinusitis. -Continue with Augmentin to complete a 5-day course. -Continue with supportive care  Dementia and depression. -Continue with Aricept and Lexapro  Paroxysmal atrial fibrillation.  Currently in sinus rhythm. -Continue home dose of metoprolol -Home dose of Eliquis is on hold due to worsening of bruising and hematoma.  Objective: Vitals:   03/12/21 2102 03/12/21 2148 03/13/21 0024 03/13/21 0413  BP: (!) 191/70 (!) 174/56 (!) 158/50 (!) 129/44  Pulse: 80  (!) 58 (!) 56  Resp: '16  16 18  '$ Temp: 98.1 F (36.7 C)  98 F (36.7 C) 97.6 F (36.4 C)  TempSrc:      SpO2: 97%  98% 98%  Weight:      Height:        Intake/Output Summary (Last 24 hours) at 03/13/2021 1458 Last data filed at 03/13/2021 [id number] Gross per 24 hour  Intake 1776.33 ml  Output 600 ml  Net 1176.33 ml   Filed Weights   03/11/21 2348  Weight: 77.1 kg    Examination:  General exam: Appears calm and comfortable  Respiratory system: Clear to auscultation. Respiratory effort normal. Cardiovascular system: S1 & S2 heard, RRR.  Gastrointestinal system: Soft, nontender, nondistended, bowel sounds positive. Central nervous system: Alert  and oriented. No focal neurological deficits. Extremities: Significant edema and bruising involving right upper extremity, pulses intact, sensations intact. Psychiatry: Judgement and insight appear normal. Mood & affect appropriate.    DVT prophylaxis: SCDs Code Status: Full Family Communication: Discussed with daughter at  bedside Disposition Plan:  Status is: Inpatient  Remains inpatient appropriate because:Inpatient level of care appropriate due to severity of illness   Dispo: The patient is from: Home              Anticipated d/c is to: Home              Patient currently is not medically stable to d/c.   Difficult to place patient No               Level of care: Med-Surg  All the records are reviewed and case discussed with Care Management/Social Worker. Management plans discussed with the patient, nursing and they are in agreement.  Consultants:   Vascular surgery  Procedures:  Antimicrobials:   Data Reviewed: I have personally reviewed following labs and imaging studies  CBC: Recent Labs  Lab 03/12/21 0037 03/12/21 0608 03/13/21 0137  WBC 8.7 8.2 10.0  HGB 9.8* 9.9* 9.4*  HCT 30.1* 30.5* 29.1*  MCV 92.9 93.3 93.3  PLT 136* 124* [id number]*   Basic Metabolic Panel: Recent Labs  Lab 03/12/21 0037 03/12/21 0608 03/13/21 0137  NA 143 144 144  K 4.1 3.9 3.4*  CL 109 110 111  CO2 '25 28 26  '$ GLUCOSE 139* 123* 76  BUN 35* 35* 32*  CREATININE 2.65* 2.53* 2.28*  CALCIUM 8.4* 8.7* 8.3*  MG  --   --  2.0   GFR: Estimated Creatinine Clearance: 17.9 mL/min (A) (by C-G formula based on SCr of 2.28 mg/dL (H)). Liver Function Tests: Recent Labs  Lab 03/12/21 0037  AST 21  ALT 11  ALKPHOS 111  BILITOT 0.8  PROT 6.1*  ALBUMIN 3.3*   No results for input(s): LIPASE, AMYLASE in the last 168 hours. No results for input(s): AMMONIA in the last 168 hours. Coagulation Profile: Recent Labs  Lab 03/12/21 0037  INR 1.4*   Cardiac Enzymes: No results for input(s): CKTOTAL, CKMB, CKMBINDEX, TROPONINI in the last 168 hours. BNP (last 3 results) No results for input(s): PROBNP in the last 8760 hours. HbA1C: Recent Labs    03/12/21 0608  HGBA1C 7.0*   CBG: Recent Labs  Lab 03/12/21 0758 03/12/21 1133 03/12/21 1511 03/12/21 2144 03/13/21 1132  GLUCAP 114* 82 123* 143* 106*    Lipid Profile: No results for input(s): CHOL, HDL, LDLCALC, TRIG, CHOLHDL, LDLDIRECT in the last 72 hours. Thyroid Function Tests: No results for input(s): TSH, T4TOTAL, FREET4, T3FREE, THYROIDAB in the last 72 hours. Anemia Panel: No results for input(s): VITAMINB12, FOLATE, FERRITIN, TIBC, IRON, RETICCTPCT in the last 72 hours. Sepsis Labs: No results for input(s): PROCALCITON, LATICACIDVEN in the last 168 hours.  Recent Results (from the past 240 hour(s))  Resp Panel by RT-PCR (Flu A&B, Covid) Nasopharyngeal Swab     Status: None   Collection Time: 03/12/21 12:40 AM   Specimen: Nasopharyngeal Swab; Nasopharyngeal(NP) swabs in vial transport medium  Result Value Ref Range Status   SARS Coronavirus 2 by RT PCR NEGATIVE NEGATIVE Final    Comment: (NOTE) SARS-CoV-2 target nucleic acids are NOT DETECTED.  The SARS-CoV-2 RNA is generally detectable in upper respiratory specimens during the acute phase of infection. The lowest concentration of SARS-CoV-2 viral copies this assay can detect is  138 copies/mL. A negative result does not preclude SARS-Cov-2 infection and should not be used as the sole basis for treatment or other patient management decisions. A negative result may occur with  improper specimen collection/handling, submission of specimen other than nasopharyngeal swab, presence of viral mutation(s) within the areas targeted by this assay, and inadequate number of viral copies(<138 copies/mL). A negative result must be combined with clinical observations, patient history, and epidemiological information. The expected result is Negative.  Fact Sheet for Patients:  EntrepreneurPulse.com.au  Fact Sheet for Healthcare Providers:  IncredibleEmployment.be  This test is no t yet approved or cleared by the [place] FDA and  has been authorized for detection and/or diagnosis of SARS-CoV-2 by FDA under an Emergency Use Authorization  (EUA). This EUA will remain  in effect (meaning this test can be used) for the duration of the COVID-19 declaration under Section 564(b)(1) of the Act, 21 U.S.C.section 360bbb-3(b)(1), unless the authorization is terminated  or revoked sooner.       Influenza A by PCR NEGATIVE NEGATIVE Final   Influenza B by PCR NEGATIVE NEGATIVE Final    Comment: (NOTE) The Xpert Xpress SARS-CoV-2/FLU/RSV plus assay is intended as an aid in the diagnosis of influenza from Nasopharyngeal swab specimens and should not be used as a sole basis for treatment. Nasal washings and aspirates are unacceptable for Xpert Xpress SARS-CoV-2/FLU/RSV testing.  Fact Sheet for Patients: EntrepreneurPulse.com.au  Fact Sheet for Healthcare Providers: IncredibleEmployment.be  This test is not yet approved or cleared by the [place] FDA and has been authorized for detection and/or diagnosis of SARS-CoV-2 by FDA under an Emergency Use Authorization (EUA). This EUA will remain in effect (meaning this test can be used) for the duration of the COVID-19 declaration under Section 564(b)(1) of the Act, 21 U.S.C. section 360bbb-3(b)(1), unless the authorization is terminated or revoked.  Performed at [facility], [address], [place], [place] [address]      Radiology Studies: US Venous Img Upper Uni Right(DVT)  Result Date: 03/13/2021 CLINICAL DATA:  Right arm swelling and pain EXAM: RIGHT UPPER EXTREMITY VENOUS DOPPLER ULTRASOUND TECHNIQUE: Gray-scale sonography with graded compression, as well as color Doppler and duplex ultrasound were performed to evaluate the upper extremity deep venous system from the level of the subclavian vein and including the jugular, axillary, basilic, radial, ulnar and upper cephalic vein. Spectral Doppler was utilized to evaluate flow at rest and with distal augmentation maneuvers. COMPARISON:  None. FINDINGS: Contralateral Subclavian  Vein: Respiratory phasicity is normal and symmetric with the symptomatic side. No evidence of thrombus. Normal compressibility. Internal Jugular Vein: No evidence of thrombus. Normal compressibility, respiratory phasicity and response to augmentation. Subclavian Vein: No evidence of thrombus. Normal compressibility, respiratory phasicity and response to augmentation. Axillary Vein: No evidence of thrombus. Normal compressibility, respiratory phasicity and response to augmentation. Cephalic Vein: No evidence of thrombus. Normal compressibility, respiratory phasicity and response to augmentation. Basilic Vein: No evidence of thrombus. Normal compressibility, respiratory phasicity and response to augmentation. Brachial Veins: No evidence of thrombus. Normal compressibility, respiratory phasicity and response to augmentation. Radial Veins: No evidence of thrombus. Normal compressibility, respiratory phasicity and response to augmentation. Ulnar Veins: No evidence of thrombus. Normal compressibility, respiratory phasicity and response to augmentation. Other Findings: Mild upper extremity subcutaneous edema noted. Redemonstration of a dilated patent aneurysmal dialysis AV fistula in upper arm. IMPRESSION: No evidence of DVT within the right upper extremity. Electronically Signed   By: Jerilynn Mages.  Shick M.D.   On: 03/13/2021  09:58   [place] UPPER EXTREMITY ARTERIAL RIGHT LIMITED (GRAFT, SINGLE VESSEL)  Result Date: 03/12/2021 CLINICAL DATA:  Right upper extremity arteriovenous fistula, extensive ecchymosis EXAM: RIGHT UPPER EXTREMITY ARTERIAL DUPLEX SCAN TECHNIQUE: Gray-scale sonography as well as color Doppler and duplex ultrasound was performed to evaluate the arteries of the upper extremity. COMPARISON:  None. FINDINGS: Grayscale and color Doppler sonographic images were obtained of the right upper extremity dialysis fistula. Pulse Doppler sonographic evaluation was not performed. The images demonstrate a right upper extremity  brachiocephalic fistula. The arteriovenous anastomosis appears stenotic with probable post not dilation of the a proximal portion of the fistula. Subsequently, there is marked aneurysmal dilation of the proximal portion of the fistula immediately proximal to a high-grade stenosis spanning roughly 2 cm. There is extensive tissue vibration within this region related to flow turbulence through the stenosis. There is moderate interstitial fluid surrounding the outflow cephalic vein centrally in keeping with perivascular hemorrhage. There is moderate subcutaneous edema within the visualized region of the right upper extremity. IMPRESSION: Right upper extremity brachiocephalic fistula. 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 Formal dialysis fistulagram is recommended for further evaluation. Electronically Signed   By: Fidela Salisbury MD   On: 03/12/2021 04:52    Scheduled Meds: . allopurinol  100 mg Oral QPM  . amoxicillin-clavulanate  1 tablet Oral Q12H  . ascorbic acid  250 mg Oral QPC lunch  . atorvastatin  40 mg Oral q morning  . feeding supplement  237 mL Oral BID BM  . fluticasone  2 spray Each Nare Daily  . furosemide  40 mg Oral q morning  . guaiFENesin  600 mg Oral BID  . insulin aspart  0-9 Units Subcutaneous TID AC & HS  . insulin glargine  25 Units Subcutaneous QPM  . irbesartan  75 mg Oral Daily  . levothyroxine  100 mcg Oral Q0600  . metoprolol succinate  150 mg Oral Q breakfast  . multivitamin with minerals  1 tablet Oral Daily  . pantoprazole  40 mg Oral q morning  . tiotropium  1 capsule Inhalation QPC lunch   Continuous Infusions: . sodium chloride 100 mL/hr at 03/13/21 0635     LOS: 0 days   Time spent: 45 minutes. More than 50% of the time was spent in counseling/coordination of care  Lorella Nimrod, MD [facility]  If  7PM-7AM, please contact night-coverage [email or web address]  03/13/2021, 2:58 PM   This record has been created using Systems analyst. Errors have been sought and corrected,but may not always be located. Such creation errors do not reflect on the standard of care.

## 2021-03-13 NOTE — Care Management Obs Status (Signed)
[facility] NOTIFICATION   Patient Details  Name: Denise [name] MRN: [id number] Date of Birth: 1930/03/31   Medicare Observation Status Notification Given:  Yes  Reviewed with patient. Leaving copy for patient at bedside.  Karrington Mccravy E Ubah Radke, LCSW 03/13/2021, 10:01 AM

## 2021-03-13 NOTE — Progress Notes (Signed)
Mobility Specialist - Progress Note   03/13/21 1400  Mobility  Range of Motion/Exercises Right leg;Left leg (AP, SLR, ABD, HG)  Level of Assistance Standby assist, set-up cues, supervision of patient - no hands on  Assistive Device None  Distance Ambulated (ft) 0 ft  Mobility Response Tolerated well  Mobility performed by Mobility specialist  $Mobility charge 1 Mobility    Pt sleeping in bed upon arrival, easily awakened by voice. Pt participated in supine exercises: ankle pumps, straight leg raises, abduction, and hand grips. Pt denies pain at this time, says she's been sleep most of the day to even notice. Denied SOB, despite O2 sitting at 89% on RA upon arrival. Mobility encouraged pt for OOB transfer to recliner, however pt declined "not right now, maybe later". Pt left in bed with alarm set.    Denise [name] Mobility Specialist 03/13/21, 2:10 PM

## 2021-03-13 NOTE — Progress Notes (Addendum)
      Daily Progress Note  RUA Hematoma, L BC AVF with hemodynamic stenoses   Reviewed RUA arterial and venous duplex: no active bleeding, hemodynamic stenoses in R BC AVF, no obvious DVT  After re-examining the pt's right arm, there continues to be NO evidence of compartment syndrome with intact motor and sensation in R hand.    Pt's entire arm from axilla to hand is swollen.  This is NOT due to hematoma.   As there is no discrete collection of blood to drain at this point, I doubt any benefit to exploring the right arm.  If anything, there is significant risk to the fistula in the event the surgical wound does not heal.  Suspect right arm will need fistulogram with venoplasty once swelling resolved in right arm.  Discussed with primary service, that non-contrast CT of RUE is last study to consider.   Adele Barthel, MD, FACS, FSVS Covering for [facility]: [phone]  03/13/2021, 2:37 PM   Called pt's daughter.  Voice mail box is full, so no message could be left.  Adele Barthel, MD, FACS, FSVS Covering for [facility]: [phone]  03/13/2021, 2:48 PM   Discussed findings with daughter and patient.  Findings on CT again aren't c/w with just a hematoma as there is soft tissue swelling extending proximally in the arm.  The hematoma appears to be lateral in the arm which should NOT compress the brachial veins.  The aneurysmal segment in the fistula appears to be larger than identified on the duplex though no contrast was used given her CKD.  On exam, pt continues to have full motor and sensation.  She is no complaining of pain currently.  - Discussed with pt and daughter plan - will continue with compression and holding Eliquis (which should be nearing full reversal) - If hematoma appears bigger, I will recommend: ligation of the brachiocephalic arteriovenous fistula with hematoma drainage - I do NOT recommended draining the  hematoma without ligation, as the hematoma appears to be in proximity to the fistula.  If pt doesn't heal adequately, the fistula could get infected - If the fistula does not get ligated, she will need fistulogram and likely intervention at some point.  Adele Barthel, MD, FACS, FSVS Covering for [facility]: [phone]  03/13/2021, 7:08 PM

## 2021-03-13 NOTE — Progress Notes (Signed)
      Daily Progress Note   Assessment/Planning:   Spontaneous hematoma RUA   Morphology of pt arm swelling is not consistent with just a hematoma as the entire arm is swollen now  Additionally, no evidence of compartment syndrome at this point  Would get RUE Venous duplex to look for a DVT  Outflow obstruction or high grade stenosis in the setting of active AVF would account for this patient's findings   Subjective  - * No surgery found *   C/o arm swelling up more   Objective   Vitals:   03/12/21 2102 03/12/21 2148 03/13/21 0024 03/13/21 0413  BP: (!) 191/70 (!) 174/56 (!) 158/50 (!) 129/44  Pulse: 80  (!) 58 (!) 56  Resp: '16  16 18  '$ Temp: 98.1 F (36.7 C)  98 F (36.7 C) 97.6 F (36.4 C)  TempSrc:      SpO2: 97%  98% 98%  Weight:      Height:         Intake/Output Summary (Last 24 hours) at 03/13/2021 0809 Last data filed at 03/13/2021 [id number] Gross per 24 hour  Intake 2515.83 ml  Output 700 ml  Net 1815.83 ml    VASC Entire right arm swollen including forearm now, though hands are less swollen, echymosis around the areas of prior hematoma per pt, just proximal elbow is a short segment of tighter skin without any frank evidence of ischemia  NEURO Intact hand grip 5/5, intact sensation    Laboratory   CBC CBC Latest Ref Rng & Units 03/13/2021 03/12/2021 03/12/2021  WBC 4.0 - 10.5 K/uL 10.0 8.2 8.7  Hemoglobin 12.0 - 15.0 g/dL 9.4(L) 9.9(L) 9.8(L)  Hematocrit 36.0 - 46.0 % 29.1(L) 30.5(L) 30.1(L)  Platelets 150 - 400 K/uL 139(L) 124(L) 136(L)    BMET    Component Value Date/Time   NA 144 03/13/2021 0137   NA 141 11/07/2013 0535   K 3.4 (L) 03/13/2021 0137   K 4.0 11/07/2013 0535   CL 111 03/13/2021 0137   CL 111 (H) 11/07/2013 0535   CO2 26 03/13/2021 0137   CO2 25 11/07/2013 0535   GLUCOSE 76 03/13/2021 0137   GLUCOSE 82 11/07/2013 0535   BUN 32 (H) 03/13/2021 0137   BUN 44 (H) 11/07/2013 0535   CREATININE 2.28 (H) 03/13/2021 0137   CREATININE  1.81 (H) 11/07/2013 0535   CALCIUM 8.3 (L) 03/13/2021 0137   CALCIUM 8.4 (L) 11/07/2013 0535   GFRNONAA 20 (L) 03/13/2021 0137   GFRNONAA 26 (L) 11/07/2013 0535   GFRAA 23 (L) 01/09/2020 2054   GFRAA 30 (L) 11/07/2013 0535     Adele Barthel, MD, FACS, FSVS Covering for [facility]: [phone]  03/13/2021, 8:09 AM

## 2021-03-14 ENCOUNTER — Inpatient Hospital Stay: Payer: Medicare Other | Admitting: Anesthesiology

## 2021-03-14 ENCOUNTER — Encounter
Admission: EM | Disposition: A | Discharge status: Skilled Nursing Facility | Payer: Medicare Other | Source: Home / Self Care | Attending: Internal Medicine

## 2021-03-14 ENCOUNTER — Encounter: Payer: Self-pay | Admitting: Family Medicine

## 2021-03-14 DIAGNOSIS — S45991A Other specified injury of unspecified blood vessel at shoulder and upper arm level, right arm, initial encounter: Secondary | ICD-10-CM | POA: Diagnosis not present

## 2021-03-14 DIAGNOSIS — M7989 Other specified soft tissue disorders: Secondary | ICD-10-CM | POA: Diagnosis not present

## 2021-03-14 DIAGNOSIS — T82898A Other specified complication of vascular prosthetic devices, implants and grafts, initial encounter: Secondary | ICD-10-CM | POA: Diagnosis not present

## 2021-03-14 DIAGNOSIS — T148XXA Other injury of unspecified body region, initial encounter: Secondary | ICD-10-CM | POA: Diagnosis not present

## 2021-03-14 HISTORY — PX: LIGATION OF ARTERIOVENOUS  FISTULA: SHX5948

## 2021-03-14 LAB — GLUCOSE, CAPILLARY
Glucose-Capillary: 97 mg/dL (ref 70–99)
Glucose-Capillary: 110 mg/dL — ABNORMAL HIGH (ref 70–99)
Glucose-Capillary: 134 mg/dL — ABNORMAL HIGH (ref 70–99)
Glucose-Capillary: 149 mg/dL — ABNORMAL HIGH (ref 70–99)

## 2021-03-14 LAB — CBC
HCT: 21.9 % — ABNORMAL LOW (ref 36.0–46.0)
HCT: 22.1 % — ABNORMAL LOW (ref 36.0–46.0)
Hemoglobin: 7.1 g/dL — ABNORMAL LOW (ref 12.0–15.0)
Hemoglobin: 7.1 g/dL — ABNORMAL LOW (ref 12.0–15.0)
MCH: 30.3 pg (ref 26.0–34.0)
MCH: 30 pg (ref 26.0–34.0)
MCHC: 32.4 g/dL (ref 30.0–36.0)
MCHC: 32.1 g/dL (ref 30.0–36.0)
MCV: 93.6 fL (ref 80.0–100.0)
MCV: 93.2 fL (ref 80.0–100.0)
Platelets: 123 10*3/uL — ABNORMAL LOW (ref 150–400)
Platelets: 117 10*3/uL — ABNORMAL LOW (ref 150–400)
RBC: 2.34 MIL/uL — ABNORMAL LOW (ref 3.87–5.11)
RBC: 2.37 MIL/uL — ABNORMAL LOW (ref 3.87–5.11)
RDW: 14.2 % (ref 11.5–15.5)
RDW: 14.1 % (ref 11.5–15.5)
WBC: 9 10*3/uL (ref 4.0–10.5)
WBC: 9.2 10*3/uL (ref 4.0–10.5)
nRBC: 0 % (ref 0.0–0.2)
nRBC: 0 % (ref 0.0–0.2)

## 2021-03-14 LAB — PREPARE RBC (CROSSMATCH)

## 2021-03-14 LAB — BASIC METABOLIC PANEL
Anion gap: 6 (ref 5–15)
BUN: 44 mg/dL — ABNORMAL HIGH (ref 8–23)
CO2: 24 mmol/L (ref 22–32)
Calcium: 8 mg/dL — ABNORMAL LOW (ref 8.9–10.3)
Chloride: 112 mmol/L — ABNORMAL HIGH (ref 98–111)
Creatinine, Ser: 2.88 mg/dL — ABNORMAL HIGH (ref 0.44–1.00)
GFR, Estimated: 15 mL/min — ABNORMAL LOW (ref >60)
Glucose, Bld: 113 mg/dL — ABNORMAL HIGH (ref 70–99)
Potassium: 4.7 mmol/L (ref 3.5–5.1)
Sodium: 142 mmol/L (ref 135–145)

## 2021-03-14 LAB — TROPONIN I (HIGH SENSITIVITY)
Troponin I (High Sensitivity): 42 ng/L — ABNORMAL HIGH (ref <18)
Troponin I (High Sensitivity): 34 ng/L — ABNORMAL HIGH (ref <18)

## 2021-03-14 SURGERY — LIGATION OF ARTERIOVENOUS  FISTULA
Anesthesia: General | Laterality: Right

## 2021-03-14 MED ORDER — SODIUM CHLORIDE 0.9 % IV SOLN
INTRAVENOUS | Status: AC | PRN
  Administered 2021-03-14: 500 mL

## 2021-03-14 MED ORDER — VASOPRESSIN 20 UNIT/ML IV SOLN
INTRAVENOUS | Status: DC | PRN
  Administered 2021-03-14: 2 [IU] via INTRAVENOUS
  Administered 2021-03-14 (×3): 1 [IU] via INTRAVENOUS

## 2021-03-14 MED ORDER — LACTATED RINGERS IV BOLUS: 250.0000 mL | Freq: Once | INTRAVENOUS | Status: DC

## 2021-03-14 MED ORDER — ONDANSETRON HCL 4 MG/2ML IJ SOLN: 4.0000 mg | Freq: Once | INTRAMUSCULAR | Status: DC | PRN

## 2021-03-14 MED ORDER — EPHEDRINE SULFATE 50 MG/ML IJ SOLN
INTRAMUSCULAR | Status: DC | PRN
  Administered 2021-03-14 (×4): 10 mg via INTRAVENOUS
  Administered 2021-03-14 (×2): 5 mg via INTRAVENOUS

## 2021-03-14 MED ORDER — HEPARIN SODIUM (PORCINE) 5000 UNIT/ML IJ SOLN
INTRAMUSCULAR | Status: DC | PRN
  Administered 2021-03-14: 5000 [IU]

## 2021-03-14 MED ORDER — CEFAZOLIN SODIUM 1 G IJ SOLR
INTRAMUSCULAR | Status: AC
  Filled 2021-03-14: qty 20

## 2021-03-14 MED ORDER — METOPROLOL TARTRATE 5 MG/5ML IV SOLN
5.0000 mg | Freq: Once | INTRAVENOUS | Status: AC
  Administered 2021-03-14: 5 mg via INTRAVENOUS
  Filled 2021-03-14: qty 5

## 2021-03-14 MED ORDER — PROPOFOL 10 MG/ML IV BOLUS
INTRAVENOUS | Status: DC | PRN
  Administered 2021-03-14: 100 mg via INTRAVENOUS

## 2021-03-14 MED ORDER — MORPHINE SULFATE (PF) 2 MG/ML IV SOLN
2.0000 mg | INTRAVENOUS | Status: DC | PRN
Start: 2021-03-14 — End: 2021-03-17

## 2021-03-14 MED ORDER — SODIUM CHLORIDE 0.9% IV SOLUTION: Freq: Once | INTRAVENOUS | Status: AC

## 2021-03-14 MED ORDER — LIDOCAINE HCL (CARDIAC) PF 100 MG/5ML IV SOSY
PREFILLED_SYRINGE | INTRAVENOUS | Status: DC | PRN
  Administered 2021-03-14: 80 mg via INTRAVENOUS

## 2021-03-14 MED ORDER — ONDANSETRON HCL 4 MG/2ML IJ SOLN
INTRAMUSCULAR | Status: AC
  Filled 2021-03-14: qty 2

## 2021-03-14 MED ORDER — PROPOFOL 10 MG/ML IV BOLUS
INTRAVENOUS | Status: AC
  Filled 2021-03-14: qty 20

## 2021-03-14 MED ORDER — FENTANYL CITRATE (PF) 100 MCG/2ML IJ SOLN: 25.0000 ug | INTRAMUSCULAR | Status: DC | PRN

## 2021-03-14 MED ORDER — OXYCODONE-ACETAMINOPHEN 5-325 MG PO TABS
1.0000 | ORAL_TABLET | Freq: Four times a day (QID) | ORAL | Status: DC | PRN
  Administered 2021-03-15: 2 via ORAL
  Filled 2021-03-14 (×2): qty 2

## 2021-03-14 MED ORDER — ONDANSETRON HCL 4 MG/2ML IJ SOLN
INTRAMUSCULAR | Status: DC | PRN
  Administered 2021-03-14: 4 mg via INTRAVENOUS

## 2021-03-14 MED ORDER — PHENYLEPHRINE HCL (PRESSORS) 10 MG/ML IV SOLN
INTRAVENOUS | Status: DC | PRN
  Administered 2021-03-14 (×4): 100 ug via INTRAVENOUS

## 2021-03-14 MED ORDER — FENTANYL CITRATE (PF) 100 MCG/2ML IJ SOLN
INTRAMUSCULAR | Status: DC | PRN
  Administered 2021-03-14 (×3): 25 ug via INTRAVENOUS

## 2021-03-14 MED ORDER — FENTANYL CITRATE (PF) 100 MCG/2ML IJ SOLN
INTRAMUSCULAR | Status: AC
  Filled 2021-03-14: qty 2

## 2021-03-14 MED ORDER — PHENYLEPHRINE HCL (PRESSORS) 10 MG/ML IV SOLN
INTRAVENOUS | Status: AC
  Filled 2021-03-14: qty 1

## 2021-03-14 MED ORDER — LIDOCAINE HCL (PF) 2 % IJ SOLN
INTRAMUSCULAR | Status: AC
  Filled 2021-03-14: qty 2

## 2021-03-14 MED ORDER — CEFAZOLIN SODIUM-DEXTROSE 2-3 GM-%(50ML) IV SOLR
INTRAVENOUS | Status: DC | PRN
  Administered 2021-03-14: 2 g via INTRAVENOUS

## 2021-03-14 MED ORDER — SUCCINYLCHOLINE CHLORIDE 200 MG/10ML IV SOSY
PREFILLED_SYRINGE | INTRAVENOUS | Status: AC
  Filled 2021-03-14: qty 10

## 2021-03-14 SURGICAL SUPPLY — 69 items
ADH SKN CLS APL DERMABOND .7 (GAUZE/BANDAGES/DRESSINGS) ×2
APL PRP STRL LF DISP 70% ISPRP (MISCELLANEOUS) ×1
BAG DECANTER FOR FLEXI CONT (MISCELLANEOUS) ×2 IMPLANT
BLADE SURG SZ11 CARB STEEL (BLADE) ×2 IMPLANT
BNDG ELASTIC 4X5.8 VLCR STR LF (GAUZE/BANDAGES/DRESSINGS) ×1 IMPLANT
BNDG ELASTIC 6X5.8 VLCR STR LF (GAUZE/BANDAGES/DRESSINGS) ×2 IMPLANT
BNDG GAUZE 4.5X4.1 6PLY STRL (MISCELLANEOUS) ×4 IMPLANT
BOOT SUTURE AID YELLOW STND (SUTURE) ×2 IMPLANT
CANISTER SUCT 1200ML W/VALVE (MISCELLANEOUS) ×2 IMPLANT
CHLORAPREP W/TINT 26 (MISCELLANEOUS) ×2 IMPLANT
CLIP SPRNG 6MM S-JAW DBL (CLIP)
COVER WAND RF STERILE (DRAPES) ×2 IMPLANT
DERMABOND ADVANCED (GAUZE/BANDAGES/DRESSINGS) ×2
DERMABOND ADVANCED .7 DNX12 (GAUZE/BANDAGES/DRESSINGS) ×1 IMPLANT
ELECT CAUTERY BLADE 6.4 (BLADE) ×2 IMPLANT
ELECT REM PT RETURN 9FT ADLT (ELECTROSURGICAL) ×2
ELECTRODE REM PT RTRN 9FT ADLT (ELECTROSURGICAL) ×1 IMPLANT
GAUZE 4X4 16PLY RFD (DISPOSABLE) ×2 IMPLANT
GAUZE SPONGE 4X4 12PLY STRL (GAUZE/BANDAGES/DRESSINGS) ×2 IMPLANT
GLOVE SURG ENC MOIS LTX SZ7 (GLOVE) ×2 IMPLANT
GLOVE SURG SYN 7.0 (GLOVE) ×2 IMPLANT
GLOVE SURG SYN 7.0 PF PI (GLOVE) ×1 IMPLANT
GLOVE SURG UNDER LTX SZ7.5 (GLOVE) ×2 IMPLANT
GOWN STRL REUS W/ TWL LRG LVL3 (GOWN DISPOSABLE) ×1 IMPLANT
GOWN STRL REUS W/ TWL XL LVL3 (GOWN DISPOSABLE) ×2 IMPLANT
GOWN STRL REUS W/TWL LRG LVL3 (GOWN DISPOSABLE) ×2
GOWN STRL REUS W/TWL XL LVL3 (GOWN DISPOSABLE) ×4
HANDLE YANKAUER SUCT BULB TIP (MISCELLANEOUS) ×1 IMPLANT
HEMOSTAT ARISTA ABSORB 1G (HEMOSTASIS) ×1 IMPLANT
HEMOSTAT SURGICEL 2X3 (HEMOSTASIS) ×2 IMPLANT
IV NS 500ML (IV SOLUTION) ×2
IV NS 500ML BAXH (IV SOLUTION) ×1 IMPLANT
LABEL OR SOLS (LABEL) ×2 IMPLANT
LOOP RED MAXI  1X406MM (MISCELLANEOUS) ×1
LOOP VESSEL MAXI  1X406 RED (MISCELLANEOUS) ×1
LOOP VESSEL MAXI 1X406 RED (MISCELLANEOUS) ×1 IMPLANT
LOOP VESSEL MINI 0.8X406 BLUE (MISCELLANEOUS) ×1 IMPLANT
LOOPS BLUE MINI 0.8X406MM (MISCELLANEOUS) ×1
MANIFOLD NEPTUNE II (INSTRUMENTS) ×2 IMPLANT
NEEDLE FILTER BLUNT 18X 1/2SAF (NEEDLE) ×1
NEEDLE FILTER BLUNT 18X1 1/2 (NEEDLE) ×1 IMPLANT
NS IRRIG 500ML POUR BTL (IV SOLUTION) ×2 IMPLANT
PACK EXTREMITY ARMC (MISCELLANEOUS) ×2 IMPLANT
PAD ABD DERMACEA PRESS 5X9 (GAUZE/BANDAGES/DRESSINGS) ×6 IMPLANT
PAD PREP 24X41 OB/GYN DISP (PERSONAL CARE ITEMS) ×2 IMPLANT
SOLUTION CELL SAVER (CLIP) IMPLANT
STOCKINETTE 48X4 2 PLY STRL (GAUZE/BANDAGES/DRESSINGS) ×1 IMPLANT
STOCKINETTE STRL 4IN 9604848 (GAUZE/BANDAGES/DRESSINGS) ×2 IMPLANT
SUT MNCRL 4-0 (SUTURE) ×2
SUT MNCRL 4-0 27XMFL (SUTURE) ×1
SUT MNCRL AB 4-0 PS2 18 (SUTURE) ×2 IMPLANT
SUT PROLENE 5-0 (SUTURE) ×2
SUT PROLENE 5-0 BB 24X2 ARM (SUTURE) ×1
SUT PROLENE 6 0 BV (SUTURE) ×2 IMPLANT
SUT SILK 0 (SUTURE) ×4
SUT SILK 0 30XBRD TIE 6 (SUTURE) ×1 IMPLANT
SUT SILK 2 0 (SUTURE)
SUT SILK 2 0 SH (SUTURE) ×2 IMPLANT
SUT SILK 2-0 18XBRD TIE 12 (SUTURE) IMPLANT
SUT SILK 3 0 (SUTURE)
SUT SILK 3-0 18XBRD TIE 12 (SUTURE) IMPLANT
SUT SILK 4 0 (SUTURE)
SUT SILK 4-0 18XBRD TIE 12 (SUTURE) IMPLANT
SUT VIC AB 3-0 SH 27 (SUTURE) ×4
SUT VIC AB 3-0 SH 27X BRD (SUTURE) ×2 IMPLANT
SUTURE MNCRL 4-0 27XMF (SUTURE) IMPLANT
SUTURE PROLEN 5-0 BB 24X2 ARM (SUTURE) IMPLANT
SYR 20ML LL LF (SYRINGE) ×2 IMPLANT
SYR 3ML LL SCALE MARK (SYRINGE) ×2 IMPLANT

## 2021-03-14 NOTE — Progress Notes (Signed)
      Daily Progress Note   Assessment/Planning:   R arm hematoma   H/H dropped from yesterday.  Suspect this is partially hemodilution  Arm looks a little bit bigger, along with H/H drop suspect some ongoing bleeding which might suggest slow bleed from aneurysmal segment of fistula  While I would have prefer to salvage this fistula, I don't think this is possible at this point.    Eliquis should be reversed at this point.  I recommended: ligation of right arm arteriotomy, evacuation of hematoma from right arm.  Risks include but are not limited: bleeding, infection, nerve damage, need for additional procedures, myocardial infarction, stroke and death.  Patient is aware of the risks and elects to proceed.   Subjective   No events overnight, complaining of pain this morning   Objective   Vitals:   03/13/21 1615 03/13/21 2022 03/14/21 0227 03/14/21 0448  BP: (!) 115/54 (!) 108/36 (!) 106/36 (!) 129/43  Pulse: (!) 59 60 60 (!) 57  Resp:  '20 20 20  '$ Temp: 97.7 F (36.5 C) 98.7 F (37.1 C) 98.3 F (36.8 C) 98.2 F (36.8 C)  TempSrc:    Oral  SpO2: 97% 97% 98% 99%  Weight:      Height:         Intake/Output Summary (Last 24 hours) at 03/14/2021 [id number] Last data filed at 03/13/2021 1639 Gross per 24 hour  Intake 754.97 ml  Output 500 ml  Net 254.97 ml    VASC R arm: echymotic area more circumferential with expansion onto forearm,  Swelling appears greater than before, + bruit, +thrill  NEURO Intact motor and sensation in right hand    Laboratory   CBC CBC Latest Ref Rng & Units 03/14/2021 03/14/2021 03/13/2021  WBC 4.0 - 10.5 K/uL 9.2 9.0 10.0  Hemoglobin 12.0 - 15.0 g/dL 7.1(L) 7.1(L) 9.4(L)  Hematocrit 36.0 - 46.0 % 22.1(L) 21.9(L) 29.1(L)  Platelets 150 - 400 K/uL 117(L) 123(L) 139(L)    BMET    Component Value Date/Time   NA 142 03/14/2021 0251   NA 141 11/07/2013 0535   K 4.7 03/14/2021 0251   K 4.0 11/07/2013 0535   CL 112 (H) 03/14/2021 0251   CL  111 (H) 11/07/2013 0535   CO2 24 03/14/2021 0251   CO2 25 11/07/2013 0535   GLUCOSE 113 (H) 03/14/2021 0251   GLUCOSE 82 11/07/2013 0535   BUN 44 (H) 03/14/2021 0251   BUN 44 (H) 11/07/2013 0535   CREATININE 2.88 (H) 03/14/2021 0251   CREATININE 1.81 (H) 11/07/2013 0535   CALCIUM 8.0 (L) 03/14/2021 0251   CALCIUM 8.4 (L) 11/07/2013 0535   GFRNONAA 15 (L) 03/14/2021 0251   GFRNONAA 26 (L) 11/07/2013 0535   GFRAA 23 (L) 01/09/2020 2054   GFRAA 30 (L) 11/07/2013 0535     Adele Barthel, MD, FACS, FSVS Covering for [facility]: [phone]  03/14/2021, 7:22 AM

## 2021-03-14 NOTE — Progress Notes (Signed)
PROGRESS NOTE    Denise [name]  [id number] DOB: Nov 13, 1929 DOA: 03/11/2021 PCP: Perrin Maltese, MD   Brief Narrative: Taken from H&P. Denise Henard Mooreis a 87 y.o.Caucasian femalewith medical history significant foratrial fibrillation on Eliquis, stage IV chronic kidney disease, COPD, type2diabetes mellitus, emphysema, GERD, hypertension, osteoarthritis and hypothyroidism, who presented to the ER with acute onset ofright upper extremity swelling and pain after lifting her dog and hearing something popping in her right arm. This happened at the site of the right upper extremity fistula that was done in 2017 however the patient has not had any dialysis since then.  Found to have a hematoma at fistula site.  Vascular surgery was also consulted.  Venous Doppler studies were negative for DVT.  Significant worsening of edema and bruising involving right upper extremity.  No sign of compartment syndrome at this time.  Vascular surgery would like to keep managing conservatively.  They were recommending doing a fistulogram which can not be done over the weekend.  CT right upper extremity shows a large hematoma. Due to worsening edema and bruising she was taken to the OR by vascular surgery this morning with resulted evacuation of 300 cc hematoma and ligation of AV fistula.  There was a rupture of AV fistula resulted in a large hematoma.  Subjective: Patient was seen after the procedure, still quite lethargic.  Assessment & Plan:   Active Problems:   Intramural hematoma of artery of right upper extremity   Hematoma   Left arm swelling  Intramural hematoma of right upper extremity.  Clinically seems worsened.  Per vascular surgery this is not a presentation for just for hematoma.  Venous Doppler studies were negative for DVT.  CT with a large hematoma, she was taken to the OR for hematoma evacuation and ligation of AV fistula by vascular surgery this morning. -Monitor hemoglobin-received 1 unit  PRBC. -Follow-up on postsurgical recommendations. -Continue to monitor for any sign of compartment syndrome. -Continue with pain management  AKI with CKD stage IV.  Creatinine with little worsening this morning. -Monitor renal function -Avoid nephrotoxins  Hypertension.  Blood pressure within goal. -Continue with Toprol-XL, irbesartan and as needed IV hydralazine  Dyslipidemia. -Continue with home dose of statin.  Type 2 diabetes mellitus.  A1c of 7.  CBG within goal -Continue with SSI -Continue with Lantus 25 units at bedtime  Acute sinusitis. -Continue with Augmentin to complete a 5-day course. -Continue with supportive care  Dementia and depression. -Continue with Aricept and Lexapro  Paroxysmal atrial fibrillation.  Currently in sinus rhythm. -Continue home dose of metoprolol -Home dose of Eliquis is on hold due to worsening of bruising and hematoma.  Objective: Vitals:   03/14/21 1215 03/14/21 1230 03/14/21 1248 03/14/21 1325  BP: (!) 131/52  (!) 121/92 (!) 115/48  Pulse: 60  79 60  Resp: '17  14 14  '$ Temp:  98.2 F (36.8 C) (!) 97.5 F (36.4 C) 97.6 F (36.4 C)  TempSrc:   Oral Oral  SpO2: 98% 96% 95% 96%  Weight:      Height:        Intake/Output Summary (Last 24 hours) at 03/14/2021 1417 Last data filed at 03/14/2021 1407 Gross per 24 hour  Intake 914.97 ml  Output 650 ml  Net 264.97 ml   Filed Weights   03/11/21 2348  Weight: 77.1 kg    Examination:  General.  Lethargic elderly lady, in no acute distress. Pulmonary.  Lungs clear bilaterally, normal respiratory effort. CV.  Regular rate and rhythm, no JVD, rub or murmur. Abdomen.  Soft, nontender, nondistended, BS positive. CNS.  Appears lethargic, no focal neurologic deficit. Extremities.  Right upper extremity with significant bruising, edema and Ace wrap now, no cyanosis, pulses intact and symmetrical. Psychiatry.  Judgment and insight appears normal.   DVT prophylaxis: SCDs Code Status:  Full Family Communication:  Disposition Plan:  Status is: Inpatient  Remains inpatient appropriate because:Inpatient level of care appropriate due to severity of illness   Dispo: The patient is from: Home              Anticipated d/c is to: Home              Patient currently is not medically stable to d/c.   Difficult to place patient No               Level of care: Med-Surg  All the records are reviewed and case discussed with Care Management/Social Worker. Management plans discussed with the patient, nursing and they are in agreement.  Consultants:   Vascular surgery  Procedures:  Antimicrobials:   Data Reviewed: I have personally reviewed following labs and imaging studies  CBC: Recent Labs  Lab 03/12/21 0037 03/12/21 0608 03/13/21 0137 03/14/21 0251 03/14/21 0507  WBC 8.7 8.2 10.0 9.0 9.2  HGB 9.8* 9.9* 9.4* 7.1* 7.1*  HCT 30.1* 30.5* 29.1* 21.9* 22.1*  MCV 92.9 93.3 93.3 93.6 93.2  PLT 136* 124* 139* 123* 123XX123*   Basic Metabolic Panel: Recent Labs  Lab 03/12/21 0037 03/12/21 0608 03/13/21 0137 03/14/21 0251  NA 143 144 144 142  K 4.1 3.9 3.4* 4.7  CL 109 110 111 112*  CO2 '25 28 26 24  '$ GLUCOSE 139* 123* 76 113*  BUN 35* 35* 32* 44*  CREATININE 2.65* 2.53* 2.28* 2.88*  CALCIUM 8.4* 8.7* 8.3* 8.0*  MG  --   --  2.0  --    GFR: Estimated Creatinine Clearance: 14.2 mL/min (A) (by C-G formula based on SCr of 2.88 mg/dL (H)). Liver Function Tests: Recent Labs  Lab 03/12/21 0037  AST 21  ALT 11  ALKPHOS 111  BILITOT 0.8  PROT 6.1*  ALBUMIN 3.3*   No results for input(s): LIPASE, AMYLASE in the last 168 hours. No results for input(s): AMMONIA in the last 168 hours. Coagulation Profile: Recent Labs  Lab 03/12/21 0037  INR 1.4*   Cardiac Enzymes: No results for input(s): CKTOTAL, CKMB, CKMBINDEX, TROPONINI in the last 168 hours. BNP (last 3 results) No results for input(s): PROBNP in the last 8760 hours. HbA1C: Recent Labs     03/12/21 0608  HGBA1C 7.0*   CBG: Recent Labs  Lab 03/13/21 1132 03/13/21 1613 03/13/21 2249 03/14/21 0749 03/14/21 1125  GLUCAP 106* 87 159* 97 110*   Lipid Profile: No results for input(s): CHOL, HDL, LDLCALC, TRIG, CHOLHDL, LDLDIRECT in the last 72 hours. Thyroid Function Tests: No results for input(s): TSH, T4TOTAL, FREET4, T3FREE, THYROIDAB in the last 72 hours. Anemia Panel: No results for input(s): VITAMINB12, FOLATE, FERRITIN, TIBC, IRON, RETICCTPCT in the last 72 hours. Sepsis Labs: No results for input(s): PROCALCITON, LATICACIDVEN in the last 168 hours.  Recent Results (from the past 240 hour(s))  Resp Panel by RT-PCR (Flu A&B, Covid) Nasopharyngeal Swab     Status: None   Collection Time: 03/12/21 12:40 AM   Specimen: Nasopharyngeal Swab; Nasopharyngeal(NP) swabs in vial transport medium  Result Value Ref Range Status   SARS Coronavirus 2 by RT PCR NEGATIVE  NEGATIVE Final    Comment: (NOTE) SARS-CoV-2 target nucleic acids are NOT DETECTED.  The SARS-CoV-2 RNA is generally detectable in upper respiratory specimens during the acute phase of infection. The lowest concentration of SARS-CoV-2 viral copies this assay can detect is 138 copies/mL. A negative result does not preclude SARS-Cov-2 infection and should not be used as the sole basis for treatment or other patient management decisions. A negative result may occur with  improper specimen collection/handling, submission of specimen other than nasopharyngeal swab, presence of viral mutation(s) within the areas targeted by this assay, and inadequate number of viral copies(<138 copies/mL). A negative result must be combined with clinical observations, patient history, and epidemiological information. The expected result is Negative.  Fact Sheet for Patients:  EntrepreneurPulse.com.au  Fact Sheet for Healthcare Providers:  IncredibleEmployment.be  This test is no t yet  approved or cleared by the [place] FDA and  has been authorized for detection and/or diagnosis of SARS-CoV-2 by FDA under an Emergency Use Authorization (EUA). This EUA will remain  in effect (meaning this test can be used) for the duration of the COVID-19 declaration under Section 564(b)(1) of the Act, 21 U.S.C.section 360bbb-3(b)(1), unless the authorization is terminated  or revoked sooner.       Influenza A by PCR NEGATIVE NEGATIVE Final   Influenza B by PCR NEGATIVE NEGATIVE Final    Comment: (NOTE) The Xpert Xpress SARS-CoV-2/FLU/RSV plus assay is intended as an aid in the diagnosis of influenza from Nasopharyngeal swab specimens and should not be used as a sole basis for treatment. Nasal washings and aspirates are unacceptable for Xpert Xpress SARS-CoV-2/FLU/RSV testing.  Fact Sheet for Patients: EntrepreneurPulse.com.au  Fact Sheet for Healthcare Providers: IncredibleEmployment.be  This test is not yet approved or cleared by the [place] FDA and has been authorized for detection and/or diagnosis of SARS-CoV-2 by FDA under an Emergency Use Authorization (EUA). This EUA will remain in effect (meaning this test can be used) for the duration of the COVID-19 declaration under Section 564(b)(1) of the Act, 21 U.S.C. section 360bbb-3(b)(1), unless the authorization is terminated or revoked.  Performed at [facility], [address], [place], [place] [address]      Radiology Studies: CT HUMERUS RIGHT WO CONTRAST  Result Date: 03/13/2021 CLINICAL DATA:  Acute onset right upper extremity swelling. 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 Multiplanar CT image reconstructions were also  generated. COMPARISON:  Arterial duplex ultrasound 03/12/2021 FINDINGS: Bones/Joint/Cartilage No acute fracture or dislocation of the right humerus or right forearm. No significant arthropathy of the glenohumeral joint. Mild AC joint arthropathy. No significant elbow arthropathy. No elbow joint effusion. No suspicious lytic or sclerotic bony lesions. Ligaments Suboptimally assessed by CT. Muscles and Tendons No acute musculotendinous abnormality is evident by CT. No intramuscular fluid collections are evident. Soft tissues Large hyperdense fluid collection within the anterolateral subcutaneous soft tissues of the right upper arm extending approximately from the level of the mid humeral diaphysis to the elbow joint. Collection measures approximately 12.4 x 4.2 x 8.0 cm (series 9, image 50; series 8, image 197). Soft tissue edema extends circumferentially throughout the upper arm and forearm, most pronounced at the dorsal aspect of the mid to distal forearm. Large tubular serpiginous structure seen within the anterior soft tissues of  the upper arm extending into the proximal forearm measuring up to 3.9 cm in diameter compatible with aneurysmal dilation of patient's known AV fistula. This was better characterized on dedicated arterial duplex ultrasound performed 1 day ago. No soft tissue gas. No axillary lymphadenopathy. IMPRESSION: 1. Large hyperdense fluid collection within the anterolateral subcutaneous soft tissues of the right upper arm extending from the level of the mid humeral diaphysis to the elbow joint measuring approximately 12.4 x 4.2 x 8.0 cm, most compatible with a hematoma. 2. Diffuse subcutaneous edema throughout the imaged right upper extremity, nonspecific. Cellulitis not excluded in the appropriate clinical setting. 3. 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 This was better characterized  on dedicated arterial duplex ultrasound performed 1 day ago. 4. No acute osseous abnormality. Electronically Signed   By: Davina Poke D.O.   On: 03/13/2021 16:49   CT FOREARM RIGHT WO CONTRAST  Result Date: 03/13/2021 CLINICAL DATA:  Acute onset right upper extremity swelling. 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 Multiplanar CT image reconstructions were also generated. COMPARISON:  Arterial duplex ultrasound 03/12/2021 FINDINGS: Bones/Joint/Cartilage No acute fracture or dislocation of the right humerus or right forearm. No significant arthropathy of the glenohumeral joint. Mild AC joint arthropathy. No significant elbow arthropathy. No elbow joint effusion. No suspicious lytic or sclerotic bony lesions. Ligaments Suboptimally assessed by CT. Muscles and Tendons No acute musculotendinous abnormality is evident by CT. No intramuscular fluid collections are evident. Soft tissues Large hyperdense fluid collection within the anterolateral subcutaneous soft tissues of the right upper arm extending approximately from the level of the mid humeral diaphysis to the elbow joint. Collection measures approximately 12.4 x 4.2 x 8.0 cm (series 9, image 50; series 8, image 197). Soft tissue edema extends circumferentially throughout the upper arm and forearm, most pronounced at the dorsal aspect of the mid to distal forearm. Large tubular serpiginous structure seen within the anterior soft tissues of the upper arm extending into the proximal forearm measuring up to 3.9 cm in diameter compatible with aneurysmal dilation of patient's known AV fistula. This was better characterized on dedicated arterial duplex ultrasound performed 1 day ago. No soft tissue gas. No axillary  lymphadenopathy. IMPRESSION: 1. Large hyperdense fluid collection within the anterolateral subcutaneous soft tissues of the right upper arm extending from the level of the mid humeral diaphysis to the elbow joint measuring approximately 12.4 x 4.2 x 8.0 cm, most compatible with a hematoma. 2. Diffuse subcutaneous edema throughout the imaged right upper extremity, nonspecific. Cellulitis not excluded in the appropriate clinical setting. 3. 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 This was better characterized on dedicated arterial duplex ultrasound performed 1 day ago. 4. No acute osseous abnormality. Electronically Signed   By: Davina Poke D.O.   On: 03/13/2021 16:49   US Venous Img Upper Uni Right(DVT)  Result Date: 03/13/2021 CLINICAL DATA:  Right arm swelling and pain EXAM: RIGHT UPPER EXTREMITY VENOUS DOPPLER ULTRASOUND TECHNIQUE: Gray-scale sonography with graded compression, as well as color Doppler and duplex ultrasound were performed to evaluate the upper extremity deep venous system from the level of the  subclavian vein and including the jugular, axillary, basilic, radial, ulnar and upper cephalic vein. Spectral Doppler was utilized to evaluate flow at rest and with distal augmentation maneuvers. COMPARISON:  None. FINDINGS: Contralateral Subclavian Vein: Respiratory phasicity is normal and symmetric with the symptomatic side. No evidence of thrombus. Normal compressibility. Internal Jugular Vein: No evidence of thrombus. Normal compressibility, respiratory phasicity and response to augmentation. Subclavian Vein: No evidence of thrombus. Normal compressibility, respiratory phasicity and response to augmentation. Axillary Vein: No evidence of thrombus. Normal compressibility, respiratory phasicity and response to augmentation. Cephalic Vein: No evidence of thrombus. Normal compressibility,  respiratory phasicity and response to augmentation. Basilic Vein: No evidence of thrombus. Normal compressibility, respiratory phasicity and response to augmentation. Brachial Veins: No evidence of thrombus. Normal compressibility, respiratory phasicity and response to augmentation. Radial Veins: No evidence of thrombus. Normal compressibility, respiratory phasicity and response to augmentation. Ulnar Veins: No evidence of thrombus. Normal compressibility, respiratory phasicity and response to augmentation. Other Findings: Mild upper extremity subcutaneous edema noted. Redemonstration of a dilated patent aneurysmal dialysis AV fistula in upper arm. IMPRESSION: No evidence of DVT within the right upper extremity. Electronically Signed   By: Jerilynn Mages.  Shick M.D.   On: 03/13/2021 09:58    Scheduled Meds: . allopurinol  100 mg Oral QPM  . amoxicillin-clavulanate  1 tablet Oral Q12H  . ascorbic acid  250 mg Oral QPC lunch  . atorvastatin  40 mg Oral q morning  . feeding supplement  237 mL Oral BID BM  . fluticasone  2 spray Each Nare Daily  . furosemide  40 mg Oral q morning  . guaiFENesin  600 mg Oral BID  . insulin aspart  0-9 Units Subcutaneous TID AC & HS  . insulin glargine  25 Units Subcutaneous QPM  . irbesartan  75 mg Oral Daily  . levothyroxine  100 mcg Oral Q0600  . metoprolol succinate  150 mg Oral Q breakfast  . multivitamin with minerals  1 tablet Oral Daily  . pantoprazole  40 mg Oral q morning  . tiotropium  1 capsule Inhalation QPC lunch   Continuous Infusions: . sodium chloride 75 mL/hr at 03/14/21 0920  . lactated ringers       LOS: 1 day   Time spent: 45 minutes. More than 50% of the time was spent in counseling/coordination of care  Lorella Nimrod, MD [facility]  If 7PM-7AM, please contact night-coverage [email or web address]  03/14/2021, 2:17 PM   This record has been created using Systems analyst. Errors have been sought and corrected,but may not  always be located. Such creation errors do not reflect on the standard of care.

## 2021-03-14 NOTE — Progress Notes (Addendum)
Pt Pt. In bed at start of shift with no complaints of pain. Just after MN Pt. Complaining of severe pain in the right upper arm. Arm looks more swollen than at start of shift but unable to tell if so as no baseline measurements charted. Pt arm measured 43 cms. ICU CN did round on PT. Pulse on R arm assessed by doppler.  Morphine 2 mg admin at 01:08 but two hours later Pt still hurting. Med ordered Q 3 hrs. Pt BP 106/36 and MAP 56. Pt finally asleep around 03:30. Pt. NPO at MN for procedure in the AM.Pt AM labs shows Hem drop from 9.4 to 7.1. Lab retaken to verify and result still 7.1. Hospitalist notified and orders placed for Type and Screen and blood transfusion. Vascular MD was also notified by hospitalist of the change in status of the Pt.

## 2021-03-14 NOTE — Anesthesia Postprocedure Evaluation (Signed)
Anesthesia Post Note  Patient: Denise [name]  Procedure(s) Performed: LIGATION OF ARTERIOVENOUS  FISTULA  AND EVACUATION OF HEMATOMA (Right )  Patient location during evaluation: PACU Anesthesia Type: General Level of consciousness: awake and alert Pain management: pain level controlled Vital Signs Assessment: post-procedure vital signs reviewed and stable Respiratory status: spontaneous breathing, nonlabored ventilation, respiratory function stable and patient connected to nasal cannula oxygen Cardiovascular status: blood pressure returned to baseline and stable Postop Assessment: no apparent nausea or vomiting Anesthetic complications: no   No complications documented.   Last Vitals:  Vitals:   03/14/21 1600 03/14/21 2021  BP: (!) 132/43 (!) 127/53  Pulse: 60 61  Resp: 16 16  Temp: 36.6 C 36.6 C  SpO2: 94% 92%    Last Pain:  Vitals:   03/14/21 2107  TempSrc:   PainSc: 0-No pain                 Martha Clan

## 2021-03-14 NOTE — Anesthesia Procedure Notes (Signed)
Procedure Name: LMA Insertion Date/Time: 03/14/2021 9:44 AM Performed by: Hedda Slade, CRNA Pre-anesthesia Checklist: Patient identified, Patient being monitored, Timeout performed, Emergency Drugs available and Suction available Patient Re-evaluated:Patient Re-evaluated prior to induction Oxygen Delivery Method: Circle system utilized Preoxygenation: Pre-oxygenation with 100% oxygen Induction Type: IV induction Ventilation: Mask ventilation without difficulty LMA: LMA inserted LMA Size: 3.5 Tube type: Oral Number of attempts: 1 Placement Confirmation: positive ETCO2 and breath sounds checked- equal and bilateral Tube secured with: Tape Dental Injury: Teeth and Oropharynx as per pre-operative assessment

## 2021-03-14 NOTE — Op Note (Signed)
OPERATIVE NOTE   PROCEDURE: 1. Ligation of right arteriovenous fistula  2. Evacuation of right arm hematoma  PRE-OPERATIVE DIAGNOSIS: right upper arm hematoma, possible right upper arm arteriovenous fistula rupture  POST-OPERATIVE DIAGNOSIS: same as above   SURGEON: Adele Barthel, MD  ANESTHESIA: general  ESTIMATED BLOOD LOSS: 150 cc  FINDINGS: 1.  300 cc of hematoma in lateral upper arm adjacent to arteriovenous fistula  2.  Frank rupture in arteriovenous fistula   SPECIMEN: none  INDICATIONS:   Denise [name] is a 87 y.o. female who presents with right hematoma adjacent to his right arm arteriovenous fistula.  Patient was anticoagulated on Eliquis so conservative management was recommended initially.  Initially, it appeared that conservative management would be successful but the patient dropped her hemoglobin overnight and the arm appeared to be somewhat bigger.  I recommended ligation of the right arteriovenous fistula and evacuation of the hematoma in the right arm.  Risk, benefits, and alternatives to proposed procedures were discussed with patient.  The patient is aware the risks include but are not limited to: bleeding, infection, nerve damage, need for additional procedures, myocardial infarction, stroke and death.  The patient agrees to proceed forward with the procedure.   DESCRIPTION: After obtaining full informed written consent, the patient was brought back to the operating room and placed supine upon the operating table.  The patient received IV antibiotics prior to induction.  After obtaining adequate anesthesia, the patient was prepped and draped in the standard fashion for: right access procedure.    I marked the location of the anastomosis for this right brachiocephalic arteriovenous fistula and also the fistula in the upper arm.  I made an incision over the anastomosis and dissected down to the fistula.  The fistula was aneurysmal, 6-8 mm in diameter.  I dissected  down to the anastomosis.  I tie a 0 silk tie roughly 2 cm away from the anastomosis and then sequentially placed another two silk ties closer and closer to the anastomosis.  I essentially used the distal fistula as a functional vein patch on the brachial artery.  There was a palpable pulse in the distal brachial artery at the end of this ligation.  I placed another 0 silk tie 3 cm distal on the fistula and transected the fistula.  I then turned my attention to the upper arm.  I made an incision over the fistula and dissected down to the fistula.  I tied off the fistula with two 0 silk ties and transected the fistula.    I then turned my attention to the lateral arm.  Using Sonosite, I identified the cavity of the hematoma.  I made an incision with a 15-blade and immediately hematoma was evident.  I had to extend the incision due to the volume of hematoma present.  I evacuated roughly 300 cc of hematoma.  There was a persistent bleed in this wound.  I washed out the wound and identified the bleeding coming from a rupture in the prior fistula.  I tied off the fistula proximal to the rupture and distal to the rupture.  This stopped the bleeding in the wound.  I packed the wound with Surgicel and held pressure.  This appeared to stop the bleeding.  I washed out the wound and not further active bleeding was present.    The anastomotic exposure and upper arm exposure was repaired with a double layer of 3-0 Vicryl in the subcutaneous tissue followed by a running subcuticular  stitch of 4-0 Monocryl.  I then turned my attention to the hematoma evacuation incision.  I reapproximated the subdermal tissue with a running 3-0 Vicryl stitch, tacking the skin down to the muscle in this process.  The skin was then reapproximated with a running subcuticular stitch of 4-0 Monocryl.    The arm was washed off and dried.  A skin tear near the wrist was noted.  All incisions were reinforced with Dermabond.  The skin tear was  repaired with Dermabond.  A bolster of 4x4 guaze was applied to the former hematoma cavity.  ABD pad were placed over the incisions in the upper arm.  The arm was wrapped with a double layer of Kerlix followed by a gentle ACE bandage from the hand to the axilla.  At the end of this this case, there remained a palpable radial pulse.   COMPLICATIONS: none  CONDITION: stable   Adele Barthel, MD, FACS, FSVS Covering for [facility]: [phone]  03/14/2021, 11:26 AM

## 2021-03-14 NOTE — Plan of Care (Signed)
Continuing with plan of care. 

## 2021-03-14 NOTE — Anesthesia Preprocedure Evaluation (Signed)
Anesthesia Evaluation  Patient identified by MRN, date of birth, ID band Patient awake    Reviewed: Allergy & Precautions, H&P , NPO status , Patient's Chart, lab work & pertinent test results, reviewed documented beta blocker date and time   History of Anesthesia Complications Negative for: history of anesthetic complications  Airway Mallampati: III  TM Distance: >3 FB Neck ROM: full    Dental  (+) Dental Advidsory Given, Edentulous Upper, Edentulous Lower   Pulmonary shortness of breath and with exertion, neg sleep apnea, COPD, Recent URI , Residual Cough, former smoker,    Pulmonary exam normal        Cardiovascular Exercise Tolerance: Good hypertension, (-) angina(-) Past MI and (-) Cardiac Stents + dysrhythmias Atrial Fibrillation (-) Valvular Problems/Murmurs Rate:Normal     Neuro/Psych 2 weeks ago c/o lightheadedness and this was reported to Dr. Candiss Norse who had her reduce her bp pill in half, this helped.  No Hx of TIA sx etc.  Exam yields no bruit.  negative neurological ROS  negative psych ROS   GI/Hepatic negative GI ROS, Neg liver ROS, GERD  ,  Endo/Other  diabetesHypothyroidism   Renal/GU ESRF and DialysisRenal disease  negative genitourinary   Musculoskeletal   Abdominal   Peds  Hematology negative hematology ROS (+)   Anesthesia Other Findings Past Medical History: No date: A-fib (HCC) No date: Arthritis No date: Cataract No date: Chronic kidney disease No date: COPD (chronic obstructive pulmonary disease) (HCC) No date: Diabetes mellitus without complication (HCC) No date: Emphysema of lung (HCC) No date: GERD (gastroesophageal reflux disease) No date: Hypertension No date: Hypothyroidism   Reproductive/Obstetrics negative OB ROS                             Anesthesia Physical  Anesthesia Plan  ASA: IV  Anesthesia Plan: General   Post-op Pain Management:     Induction: Intravenous  PONV Risk Score and Plan: 3 and Ondansetron, Dexamethasone and Treatment may vary due to age or medical condition  Airway Management Planned: LMA  Additional Equipment:   Intra-op Plan:   Post-operative Plan: Extubation in OR  Informed Consent: I have reviewed the patients History and Physical, chart, labs and discussed the procedure including the risks, benefits and alternatives for the proposed anesthesia with the patient or authorized representative who has indicated his/her understanding and acceptance.       Plan Discussed with: CRNA  Anesthesia Plan Comments:         Anesthesia Quick Evaluation

## 2021-03-14 NOTE — Transfer of Care (Signed)
Immediate Anesthesia Transfer of Care Note  Patient: Denise [name]  Procedure(s) Performed: LIGATION OF ARTERIOVENOUS  FISTULA  AND EVACUATION OF HEMATOMA (Right )  Patient Location: PACU  Anesthesia Type:General  Level of Consciousness: sedated  Airway & Oxygen Therapy: Patient Spontanous Breathing and Patient connected to face mask oxygen  Post-op Assessment: Report given to RN and Post -op Vital signs reviewed and stable  Post vital signs: Reviewed and stable  Last Vitals:  Vitals Value Taken Time  BP 139/40 03/14/21 1123  Temp 36.8 C 03/14/21 1123  Pulse 57 03/14/21 1125  Resp 20 03/14/21 1125  SpO2 100 % 03/14/21 1125  Vitals shown include unvalidated device data.  Last Pain:  Vitals:   03/14/21 0900  TempSrc:   PainSc: 0-No pain      Patients Stated Pain Goal: 0 ([id number])  Complications: No complications documented.

## 2021-03-15 ENCOUNTER — Encounter: Payer: Self-pay | Admitting: Vascular Surgery

## 2021-03-15 DIAGNOSIS — N179 Acute kidney failure, unspecified: Secondary | ICD-10-CM | POA: Diagnosis not present

## 2021-03-15 DIAGNOSIS — M7989 Other specified soft tissue disorders: Secondary | ICD-10-CM | POA: Diagnosis not present

## 2021-03-15 DIAGNOSIS — E1169 Type 2 diabetes mellitus with other specified complication: Secondary | ICD-10-CM | POA: Diagnosis not present

## 2021-03-15 DIAGNOSIS — T148XXA Other injury of unspecified body region, initial encounter: Secondary | ICD-10-CM | POA: Diagnosis not present

## 2021-03-15 DIAGNOSIS — S45991A Other specified injury of unspecified blood vessel at shoulder and upper arm level, right arm, initial encounter: Secondary | ICD-10-CM | POA: Diagnosis not present

## 2021-03-15 DIAGNOSIS — G4700 Insomnia, unspecified: Secondary | ICD-10-CM | POA: Diagnosis not present

## 2021-03-15 LAB — CBC
HCT: 22.5 % — ABNORMAL LOW (ref 36.0–46.0)
Hemoglobin: 7.5 g/dL — ABNORMAL LOW (ref 12.0–15.0)
MCH: 30.7 pg (ref 26.0–34.0)
MCHC: 33.3 g/dL (ref 30.0–36.0)
MCV: 92.2 fL (ref 80.0–100.0)
Platelets: 116 10*3/uL — ABNORMAL LOW (ref 150–400)
RBC: 2.44 MIL/uL — ABNORMAL LOW (ref 3.87–5.11)
RDW: 14.4 % (ref 11.5–15.5)
WBC: 9.6 10*3/uL (ref 4.0–10.5)
nRBC: 0.2 % (ref 0.0–0.2)

## 2021-03-15 LAB — BASIC METABOLIC PANEL
Anion gap: 7 (ref 5–15)
BUN: 52 mg/dL — ABNORMAL HIGH (ref 8–23)
CO2: 22 mmol/L (ref 22–32)
Calcium: 7.7 mg/dL — ABNORMAL LOW (ref 8.9–10.3)
Chloride: 115 mmol/L — ABNORMAL HIGH (ref 98–111)
Creatinine, Ser: 3.12 mg/dL — ABNORMAL HIGH (ref 0.44–1.00)
GFR, Estimated: 14 mL/min — ABNORMAL LOW (ref >60)
Glucose, Bld: 141 mg/dL — ABNORMAL HIGH (ref 70–99)
Potassium: 4.7 mmol/L (ref 3.5–5.1)
Sodium: 144 mmol/L (ref 135–145)

## 2021-03-15 LAB — GLUCOSE, CAPILLARY
Glucose-Capillary: 113 mg/dL — ABNORMAL HIGH (ref 70–99)
Glucose-Capillary: 152 mg/dL — ABNORMAL HIGH (ref 70–99)
Glucose-Capillary: 143 mg/dL — ABNORMAL HIGH (ref 70–99)
Glucose-Capillary: 146 mg/dL — ABNORMAL HIGH (ref 70–99)

## 2021-03-15 LAB — PREPARE RBC (CROSSMATCH)

## 2021-03-15 MED ORDER — SODIUM CHLORIDE 0.9% IV SOLUTION: Freq: Once | INTRAVENOUS | Status: AC

## 2021-03-15 MED ORDER — METOPROLOL TARTRATE 5 MG/5ML IV SOLN
10.0000 mg | Freq: Once | INTRAVENOUS | Status: AC
  Administered 2021-03-15: 10 mg via INTRAVENOUS
  Filled 2021-03-15: qty 10

## 2021-03-15 MED ORDER — METOPROLOL SUCCINATE ER 25 MG PO TB24
25.0000 mg | ORAL_TABLET | Freq: Every day | ORAL | Status: DC
  Administered 2021-03-16 – 2021-03-18 (×3): 25 mg via ORAL
  Filled 2021-03-15 (×3): qty 1

## 2021-03-15 NOTE — Progress Notes (Signed)
MEWS yellow initiated. Continuing to monitor.

## 2021-03-15 NOTE — Progress Notes (Signed)
No new orders received for medication administration for BP or HR. Order to continue to monitor and inform hospitalist if HR goes above 150.

## 2021-03-15 NOTE — Progress Notes (Signed)
PROGRESS NOTE    Denise [name]  [id number] DOB: Dec 01, 1929 DOA: 03/11/2021 PCP: Perrin Maltese, MD   Brief Narrative: Taken from H&P. Denise [name] a 87 y.o.Caucasian femalewith medical history significant foratrial fibrillation on Eliquis, stage IV chronic kidney disease, COPD, type2diabetes mellitus, emphysema, GERD, hypertension, osteoarthritis and hypothyroidism, who presented to the ER with acute onset ofright upper extremity swelling and pain after lifting her dog and hearing something popping in her right arm. This happened at the site of the right upper extremity fistula that was done in 2017 however the patient has not had any dialysis since then.  Found to have a hematoma at fistula site.  Vascular surgery was also consulted.  Venous Doppler studies were negative for DVT.  Significant worsening of edema and bruising involving right upper extremity.  No sign of compartment syndrome at this time.  Vascular surgery would like to keep managing conservatively.  They were recommending doing a fistulogram which can not be done over the weekend.  CT right upper extremity shows a large hematoma. Due to worsening edema and bruising she was taken to the OR by vascular surgery yesterday with resulted evacuation of 300 cc hematoma and ligation of AV fistula.  There was a rupture of AV fistula resulted in a large hematoma.  Subjective: Patient continued to experience some right arm discomfort, swelling improving.  Assessment & Plan:   Active Problems:   Intramural hematoma of artery of right upper extremity   Hematoma   Left arm swelling  Intramural hematoma of right upper extremity.  Venous Doppler studies were negative for DVT.  CT with a large hematoma, she was taken to the OR for hematoma evacuation and ligation of AV fistula by vascular surgery yesterday.  Clinically seems improving with some improvement in her edema and pain.  She received 1 unit of PRBC yesterday -Hemoglobin  at 7.5 this morning-ordered 1 more unit of PRBC -Continue to monitor for any sign of compartment syndrome. -Continue with pain management  AKI with CKD stage IV.  Creatinine with little worsening this morning. Nephrology consult-patient will not be able to use her prior fistula as it was ligated. -Monitor renal function -Avoid nephrotoxins  Hypertension.  Blood pressure within goal. -Continue with Toprol-XL, irbesartan and as needed IV hydralazine  Dyslipidemia. -Continue with home dose of statin.  Type 2 diabetes mellitus.  A1c of 7.  CBG within goal -Continue with SSI -Continue with Lantus 25 units at bedtime  Acute sinusitis. -Continue with Augmentin to complete a 5-day course. -Continue with supportive care  Dementia and depression. -Continue with Aricept and Lexapro  Paroxysmal atrial fibrillation.  Currently in sinus rhythm. -Continue home dose of metoprolol -Home dose of Eliquis is on hold due to worsening of bruising and hematoma.  Objective: Vitals:   03/15/21 0315 03/15/21 0519 03/15/21 0857 03/15/21 1145  BP: (!) 117/54 (!) 93/50 (!) 98/54 (!) 130/49  Pulse: (!) 137 (!) 124 (!) 126 75  Resp: 20 18    Temp: (!) 97.4 F (36.3 C) 97.9 F (36.6 C)    TempSrc:      SpO2: 96% 91%  91%  Weight:      Height:        Intake/Output Summary (Last 24 hours) at 03/15/2021 1600 Last data filed at 03/15/2021 1410 Gross per 24 hour  Intake 1035.96 ml  Output 200 ml  Net 835.96 ml   Filed Weights   03/11/21 2348  Weight: 77.1 kg    Examination:  General. Well-developed elderly lady, In no acute distress. Pulmonary.  Lungs clear bilaterally, normal respiratory effort. CV.  Regular rate and rhythm, no JVD, rub or murmur. Abdomen.  Soft, nontender, nondistended, BS positive. CNS.  Alert and oriented .  No focal neurologic deficit. Extremities.  Right upper extremity with Ace wrap, edema seems improving, pulses intact and symmetrical. Psychiatry.  Judgment and  insight appears normal.  DVT prophylaxis: SCDs Code Status: Full Family Communication:  Disposition Plan:  Status is: Inpatient  Remains inpatient appropriate because:Inpatient level of care appropriate due to severity of illness   Dispo: The patient is from: Home              Anticipated d/c is to: Home              Patient currently is not medically stable to d/c.   Difficult to place patient No               Level of care: Med-Surg  All the records are reviewed and case discussed with Care Management/Social Worker. Management plans discussed with the patient, nursing and they are in agreement.  Consultants:   Vascular surgery  Procedures:  Antimicrobials:   Data Reviewed: I have personally reviewed following labs and imaging studies  CBC: Recent Labs  Lab 03/12/21 0608 03/13/21 0137 03/14/21 0251 03/14/21 0507 03/15/21 0502  WBC 8.2 10.0 9.0 9.2 9.6  HGB 9.9* 9.4* 7.1* 7.1* 7.5*  HCT 30.5* 29.1* 21.9* 22.1* 22.5*  MCV 93.3 93.3 93.6 93.2 92.2  PLT 124* 139* 123* 117* [id number]*   Basic Metabolic Panel: Recent Labs  Lab 03/12/21 0037 03/12/21 0608 03/13/21 0137 03/14/21 0251 03/15/21 0502  NA 143 144 144 142 144  K 4.1 3.9 3.4* 4.7 4.7  CL 109 110 111 112* 115*  CO2 '25 28 26 24 22  '$ GLUCOSE 139* 123* 76 113* 141*  BUN 35* 35* 32* 44* 52*  CREATININE 2.65* 2.53* 2.28* 2.88* 3.12*  CALCIUM 8.4* 8.7* 8.3* 8.0* 7.7*  MG  --   --  2.0  --   --    GFR: Estimated Creatinine Clearance: 13.1 mL/min (A) (by C-G formula based on SCr of 3.12 mg/dL (H)). Liver Function Tests: Recent Labs  Lab 03/12/21 0037  AST 21  ALT 11  ALKPHOS 111  BILITOT 0.8  PROT 6.1*  ALBUMIN 3.3*   No results for input(s): LIPASE, AMYLASE in the last 168 hours. No results for input(s): AMMONIA in the last 168 hours. Coagulation Profile: Recent Labs  Lab 03/12/21 0037  INR 1.4*   Cardiac Enzymes: No results for input(s): CKTOTAL, CKMB, CKMBINDEX, TROPONINI in the last 168  hours. BNP (last 3 results) No results for input(s): PROBNP in the last 8760 hours. HbA1C: No results for input(s): HGBA1C in the last 72 hours. CBG: Recent Labs  Lab 03/14/21 1125 03/14/21 1641 03/14/21 2201 03/15/21 0737 03/15/21 1143  GLUCAP 110* 134* 149* 113* 152*   Lipid Profile: No results for input(s): CHOL, HDL, LDLCALC, TRIG, CHOLHDL, LDLDIRECT in the last 72 hours. Thyroid Function Tests: No results for input(s): TSH, T4TOTAL, FREET4, T3FREE, THYROIDAB in the last 72 hours. Anemia Panel: No results for input(s): VITAMINB12, FOLATE, FERRITIN, TIBC, IRON, RETICCTPCT in the last 72 hours. Sepsis Labs: No results for input(s): PROCALCITON, LATICACIDVEN in the last 168 hours.  Recent Results (from the past 240 hour(s))  Resp Panel by RT-PCR (Flu A&B, Covid) Nasopharyngeal Swab     Status: None   Collection Time: 03/12/21 12:40  AM   Specimen: Nasopharyngeal Swab; Nasopharyngeal(NP) swabs in vial transport medium  Result Value Ref Range Status   SARS Coronavirus 2 by RT PCR NEGATIVE NEGATIVE Final    Comment: (NOTE) SARS-CoV-2 target nucleic acids are NOT DETECTED.  The SARS-CoV-2 RNA is generally detectable in upper respiratory specimens during the acute phase of infection. The lowest concentration of SARS-CoV-2 viral copies this assay can detect is 138 copies/mL. A negative result does not preclude SARS-Cov-2 infection and should not be used as the sole basis for treatment or other patient management decisions. A negative result may occur with  improper specimen collection/handling, submission of specimen other than nasopharyngeal swab, presence of viral mutation(s) within the areas targeted by this assay, and inadequate number of viral copies(<138 copies/mL). A negative result must be combined with clinical observations, patient history, and epidemiological information. The expected result is Negative.  Fact Sheet for Patients:   EntrepreneurPulse.com.au  Fact Sheet for Healthcare Providers:  IncredibleEmployment.be  This test is no t yet approved or cleared by the [place] FDA and  has been authorized for detection and/or diagnosis of SARS-CoV-2 by FDA under an Emergency Use Authorization (EUA). This EUA will remain  in effect (meaning this test can be used) for the duration of the COVID-19 declaration under Section 564(b)(1) of the Act, 21 U.S.C.section 360bbb-3(b)(1), unless the authorization is terminated  or revoked sooner.       Influenza A by PCR NEGATIVE NEGATIVE Final   Influenza B by PCR NEGATIVE NEGATIVE Final    Comment: (NOTE) The Xpert Xpress SARS-CoV-2/FLU/RSV plus assay is intended as an aid in the diagnosis of influenza from Nasopharyngeal swab specimens and should not be used as a sole basis for treatment. Nasal washings and aspirates are unacceptable for Xpert Xpress SARS-CoV-2/FLU/RSV testing.  Fact Sheet for Patients: EntrepreneurPulse.com.au  Fact Sheet for Healthcare Providers: IncredibleEmployment.be  This test is not yet approved or cleared by the [place] FDA and has been authorized for detection and/or diagnosis of SARS-CoV-2 by FDA under an Emergency Use Authorization (EUA). This EUA will remain in effect (meaning this test can be used) for the duration of the COVID-19 declaration under Section 564(b)(1) of the Act, 21 U.S.C. section 360bbb-3(b)(1), unless the authorization is terminated or revoked.  Performed at [facility], [address] [place], [place] [address]      Radiology Studies: CT HUMERUS RIGHT WO CONTRAST  Result Date: 03/13/2021 CLINICAL DATA:  Acute onset right upper extremity swelling. History of right upper extremity AV fistula with aneurysmal dilation of the proximal portion of the fistula EXAM: CT OF THE RIGHT FOREARM WITHOUT CONTRAST; CT OF THE RIGHT  HUMERUS WITHOUT CONTRAST TECHNIQUE: Multidetector CT imaging was performed according to the standard protocol. Multiplanar CT image reconstructions were also generated. COMPARISON:  Arterial duplex ultrasound 03/12/2021 FINDINGS: Bones/Joint/Cartilage No acute fracture or dislocation of the right humerus or right forearm. No significant arthropathy of the glenohumeral joint. Mild AC joint arthropathy. No significant elbow arthropathy. No elbow joint effusion. No suspicious lytic or sclerotic bony lesions. Ligaments Suboptimally assessed by CT. Muscles and Tendons No acute musculotendinous abnormality is evident by CT. No intramuscular fluid collections are evident. Soft tissues Large hyperdense fluid collection within the anterolateral subcutaneous soft tissues of the right upper arm extending approximately from the level of the mid humeral diaphysis to the elbow joint. Collection measures approximately 12.4 x 4.2 x 8.0 cm (series 9, image 50; series 8, image 197). Soft tissue edema extends circumferentially throughout the  upper arm and forearm, most pronounced at the dorsal aspect of the mid to distal forearm. Large tubular serpiginous structure seen within the anterior soft tissues of the upper arm extending into the proximal forearm measuring up to 3.9 cm in diameter compatible with aneurysmal dilation of patient's known AV fistula. This was better characterized on dedicated arterial duplex ultrasound performed 1 day ago. No soft tissue gas. No axillary lymphadenopathy. IMPRESSION: 1. Large hyperdense fluid collection within the anterolateral subcutaneous soft tissues of the right upper arm extending from the level of the mid humeral diaphysis to the elbow joint measuring approximately 12.4 x 4.2 x 8.0 cm, most compatible with a hematoma. 2. Diffuse subcutaneous edema throughout the imaged right upper extremity, nonspecific. Cellulitis not excluded in the appropriate clinical setting. 3. Large tubular serpiginous  structure within the anterior soft tissues of the upper arm compatible with aneurysmal dilation of patient's known AV fistula. This was better characterized on dedicated arterial duplex ultrasound performed 1 day ago. 4. No acute osseous abnormality. Electronically Signed   By: Davina Poke D.O.   On: 03/13/2021 16:49   CT FOREARM RIGHT WO CONTRAST  Result Date: 03/13/2021 CLINICAL DATA:  Acute onset right upper extremity swelling. 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 Multiplanar CT image reconstructions were also generated. COMPARISON:  Arterial duplex ultrasound 03/12/2021 FINDINGS: Bones/Joint/Cartilage No acute fracture or dislocation of the right humerus or right forearm. No significant arthropathy of the glenohumeral joint. Mild AC joint arthropathy. No significant elbow arthropathy. No elbow joint effusion. No suspicious lytic or sclerotic bony lesions. Ligaments Suboptimally assessed by CT. Muscles and Tendons No acute musculotendinous abnormality is evident by CT. No intramuscular fluid collections are evident. Soft tissues Large hyperdense fluid collection within the anterolateral subcutaneous soft tissues of the right upper arm extending approximately from the level of the mid humeral diaphysis to the elbow joint. Collection measures approximately 12.4 x 4.2 x 8.0 cm (series 9, image 50; series 8, image 197). Soft tissue edema extends circumferentially throughout the upper arm and forearm, most pronounced at the dorsal aspect of the mid to distal forearm. Large tubular serpiginous structure seen within the anterior soft tissues of the upper arm extending into the proximal forearm measuring up to 3.9 cm in diameter compatible with aneurysmal dilation of  patient's known AV fistula. This was better characterized on dedicated arterial duplex ultrasound performed 1 day ago. No soft tissue gas. No axillary lymphadenopathy. IMPRESSION: 1. Large hyperdense fluid collection within the anterolateral subcutaneous soft tissues of the right upper arm extending from the level of the mid humeral diaphysis to the elbow joint measuring approximately 12.4 x 4.2 x 8.0 cm, most compatible with a hematoma. 2. Diffuse subcutaneous edema throughout the imaged right upper extremity, nonspecific. Cellulitis not excluded in the appropriate clinical setting. 3. 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 This was better characterized on dedicated arterial duplex ultrasound performed 1 day ago. 4. No acute osseous abnormality. Electronically Signed   By: Davina Poke D.O.   On: 03/13/2021 16:49    Scheduled Meds: . sodium chloride   Intravenous Once  . allopurinol  100 mg Oral QPM  . amoxicillin-clavulanate  1 tablet Oral Q12H  .  ascorbic acid  250 mg Oral QPC lunch  . atorvastatin  40 mg Oral q morning  . feeding supplement  237 mL Oral BID BM  . fluticasone  2 spray Each Nare Daily  . furosemide  40 mg Oral q morning  . guaiFENesin  600 mg Oral BID  . insulin aspart  0-9 Units Subcutaneous TID AC & HS  . insulin glargine  25 Units Subcutaneous QPM  . irbesartan  75 mg Oral Daily  . levothyroxine  100 mcg Oral Q0600  . [START ON 03/16/2021] metoprolol succinate  25 mg Oral Q breakfast  . multivitamin with minerals  1 tablet Oral Daily  . pantoprazole  40 mg Oral q morning  . tiotropium  1 capsule Inhalation QPC lunch   Continuous Infusions: . sodium chloride 75 mL/hr at 03/15/21 0532  . lactated ringers Stopped (03/14/21 2012)     LOS: 2 days   Time spent: 35 minutes. More than 50% of the time was spent in counseling/coordination of care  Lorella Nimrod, MD [facility]  If 7PM-7AM, please contact night-coverage [email or web address]  03/15/2021, 4:00 PM   This record has been created using Systems analyst. Errors have been sought and corrected,but may not always be located. Such creation errors do not reflect on the standard of care.

## 2021-03-15 NOTE — Progress Notes (Signed)
[facility]  ROUNDING NOTE   Subjective:   Denise [name] is a 87 y.o. female with past medical history of A-fib on Eliquis, COPD, GERD, diabetes type 2, hypertension and CKD stage 4. She is a current patient of [facility], but has not been to an appointment since July 2021. She presents to ED complaining of swelling and pain in right upper arm. She currently has a AVF at that site. She reported lifting her dog and hearing a pop.   Fistula was placed in 2017 presuming the patient would need dialysis in the near future. AVF has not been used as of date.  Patient seen laying in bed Alert, oriented, but confused at times during visit Tolerating meals Denies nausea and diarrhea Denies shortness of breath   Objective:  Vital signs in last 24 hours:  Temp:  [97.4 F (36.3 C)-98 F (36.7 C)] 97.9 F (36.6 C) (05/23 0519) Pulse Rate:  [60-137] 75 (05/23 1145) Resp:  [16-20] 18 (05/23 0519) BP: (81-132)/(43-56) 130/49 (05/23 1145) SpO2:  [91 %-96 %] 91 % (05/23 1145)  Weight change:  Filed Weights   03/11/21 2348  Weight: 77.1 kg    Intake/Output: I/O last 3 completed shifts: In: 2677 [I.V.:2287; Blood:340; IV Piggyback:50] Out: 352 [Urine:202; Blood:150]   Intake/Output this shift:  Total I/O In: 240 [P.O.:240] Out: -   Physical Exam: General: NAD, laying in bed  Head: Normocephalic, atraumatic. Moist oral mucosal membranes  Eyes: Anicteric  Lungs:  Clear to auscultation  Heart: Regular rate and rhythm  Abdomen:  Soft, nontender,   Extremities:  no peripheral edema.  Neurologic: Alert and oriented, confused and forgetful at times, moving all four extremities  Skin: No lesions  Access: Rt AVF    Basic Metabolic Panel: Recent Labs  Lab 03/12/21 0037 03/12/21 0608 03/13/21 0137 03/14/21 0251 03/15/21 0502  NA 143 144 144 142 144  K 4.1 3.9 3.4* 4.7 4.7  CL 109 110 111 112* 115*  CO2 '25 28 26 24 22  '$ GLUCOSE 139* 123* 76 113* 141*  BUN 35* 35* 32* 44* 52*   CREATININE 2.65* 2.53* 2.28* 2.88* 3.12*  CALCIUM 8.4* 8.7* 8.3* 8.0* 7.7*  MG  --   --  2.0  --   --     Liver Function Tests: Recent Labs  Lab 03/12/21 0037  AST 21  ALT 11  ALKPHOS 111  BILITOT 0.8  PROT 6.1*  ALBUMIN 3.3*   No results for input(s): LIPASE, AMYLASE in the last 168 hours. No results for input(s): AMMONIA in the last 168 hours.  CBC: Recent Labs  Lab 03/12/21 0608 03/13/21 0137 03/14/21 0251 03/14/21 0507 03/15/21 0502  WBC 8.2 10.0 9.0 9.2 9.6  HGB 9.9* 9.4* 7.1* 7.1* 7.5*  HCT 30.5* 29.1* 21.9* 22.1* 22.5*  MCV 93.3 93.3 93.6 93.2 92.2  PLT 124* 139* 123* 117* 116*    Cardiac Enzymes: No results for input(s): CKTOTAL, CKMB, CKMBINDEX, TROPONINI in the last 168 hours.  BNP: Invalid input(s): POCBNP  CBG: Recent Labs  Lab 03/14/21 1125 03/14/21 1641 03/14/21 2201 03/15/21 0737 03/15/21 1143  GLUCAP 110* 134* 149* 113* 152*    Microbiology: Results for orders placed or performed during the hospital encounter of 03/11/21  Resp Panel by RT-PCR (Flu A&B, Covid) Nasopharyngeal Swab     Status: None   Collection Time: 03/12/21 12:40 AM   Specimen: Nasopharyngeal Swab; Nasopharyngeal(NP) swabs in vial transport medium  Result Value Ref Range Status   SARS Coronavirus 2  by RT PCR NEGATIVE NEGATIVE Final    Comment: (NOTE) SARS-CoV-2 target nucleic acids are NOT DETECTED.  The SARS-CoV-2 RNA is generally detectable in upper respiratory specimens during the acute phase of infection. The lowest concentration of SARS-CoV-2 viral copies this assay can detect is 138 copies/mL. A negative result does not preclude SARS-Cov-2 infection and should not be used as the sole basis for treatment or other patient management decisions. A negative result may occur with  improper specimen collection/handling, submission of specimen other than nasopharyngeal swab, presence of viral mutation(s) within the areas targeted by this assay, and inadequate number  of viral copies(<138 copies/mL). A negative result must be combined with clinical observations, patient history, and epidemiological information. The expected result is Negative.  Fact Sheet for Patients:  EntrepreneurPulse.com.au  Fact Sheet for Healthcare Providers:  IncredibleEmployment.be  This test is no t yet approved or cleared by the [place] FDA and  has been authorized for detection and/or diagnosis of SARS-CoV-2 by FDA under an Emergency Use Authorization (EUA). This EUA will remain  in effect (meaning this test can be used) for the duration of the COVID-19 declaration under Section 564(b)(1) of the Act, 21 U.S.C.section 360bbb-3(b)(1), unless the authorization is terminated  or revoked sooner.       Influenza A by PCR NEGATIVE NEGATIVE Final   Influenza B by PCR NEGATIVE NEGATIVE Final    Comment: (NOTE) The Xpert Xpress SARS-CoV-2/FLU/RSV plus assay is intended as an aid in the diagnosis of influenza from Nasopharyngeal swab specimens and should not be used as a sole basis for treatment. Nasal washings and aspirates are unacceptable for Xpert Xpress SARS-CoV-2/FLU/RSV testing.  Fact Sheet for Patients: EntrepreneurPulse.com.au  Fact Sheet for Healthcare Providers: IncredibleEmployment.be  This test is not yet approved or cleared by the [place] FDA and has been authorized for detection and/or diagnosis of SARS-CoV-2 by FDA under an Emergency Use Authorization (EUA). This EUA will remain in effect (meaning this test can be used) for the duration of the COVID-19 declaration under Section 564(b)(1) of the Act, 21 U.S.C. section 360bbb-3(b)(1), unless the authorization is terminated or revoked.  Performed at [facility], [address] [place], [place] [address]     Coagulation Studies: No results for input(s): LABPROT, INR in the last 72 hours.  Urinalysis: No  results for input(s): COLORURINE, LABSPEC, PHURINE, GLUCOSEU, HGBUR, BILIRUBINUR, KETONESUR, PROTEINUR, UROBILINOGEN, NITRITE, LEUKOCYTESUR in the last 72 hours.  Invalid input(s): APPERANCEUR    Imaging: CT HUMERUS RIGHT WO CONTRAST  Result Date: 03/13/2021 CLINICAL DATA:  Acute onset right upper extremity swelling. 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 Multiplanar CT image reconstructions were also generated. COMPARISON:  Arterial duplex ultrasound 03/12/2021 FINDINGS: Bones/Joint/Cartilage No acute fracture or dislocation of the right humerus or right forearm. No significant arthropathy of the glenohumeral joint. Mild AC joint arthropathy. No significant elbow arthropathy. No elbow joint effusion. No suspicious lytic or sclerotic bony lesions. Ligaments Suboptimally assessed by CT. Muscles and Tendons No acute musculotendinous abnormality is evident by CT. No intramuscular fluid collections are evident. Soft tissues Large hyperdense fluid collection within the anterolateral subcutaneous soft tissues of the right upper arm extending approximately from the level of the mid humeral diaphysis to the elbow joint. Collection measures approximately 12.4 x 4.2 x  8.0 cm (series 9, image 50; series 8, image 197). Soft tissue edema extends circumferentially throughout the upper arm and forearm, most pronounced at the dorsal aspect of the mid to distal forearm. Large tubular serpiginous structure seen within the anterior soft tissues of the upper arm extending into the proximal forearm measuring up to 3.9 cm in diameter compatible with aneurysmal dilation of patient's known AV fistula. This was better characterized on dedicated arterial duplex ultrasound performed 1 day ago.  No soft tissue gas. No axillary lymphadenopathy. IMPRESSION: 1. Large hyperdense fluid collection within the anterolateral subcutaneous soft tissues of the right upper arm extending from the level of the mid humeral diaphysis to the elbow joint measuring approximately 12.4 x 4.2 x 8.0 cm, most compatible with a hematoma. 2. Diffuse subcutaneous edema throughout the imaged right upper extremity, nonspecific. Cellulitis not excluded in the appropriate clinical setting. 3. 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 This was better characterized on dedicated arterial duplex ultrasound performed 1 day ago. 4. No acute osseous abnormality. Electronically Signed   By: Davina Poke D.O.   On: 03/13/2021 16:49   CT FOREARM RIGHT WO CONTRAST  Result Date: 03/13/2021 CLINICAL DATA:  Acute onset right upper extremity swelling. 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 Multiplanar CT image reconstructions were also generated. COMPARISON:  Arterial duplex ultrasound 03/12/2021 FINDINGS: Bones/Joint/Cartilage No acute fracture or dislocation of the right humerus or right forearm. No significant arthropathy of the glenohumeral joint. Mild AC joint arthropathy. No significant elbow arthropathy. No elbow joint effusion. No suspicious lytic or sclerotic bony lesions. Ligaments Suboptimally assessed by CT. Muscles and Tendons No acute musculotendinous abnormality is evident by CT. No intramuscular fluid collections are evident. Soft tissues Large hyperdense fluid collection within the anterolateral subcutaneous soft tissues of the right upper arm extending approximately from the  level of the mid humeral diaphysis to the elbow joint. Collection measures approximately 12.4 x 4.2 x 8.0 cm (series 9, image 50; series 8, image 197). Soft tissue edema extends circumferentially throughout the upper arm and forearm, most pronounced at the dorsal aspect of the mid to distal forearm. Large tubular serpiginous structure seen within the anterior soft tissues of the upper arm extending into the proximal forearm measuring up to 3.9 cm in diameter compatible with aneurysmal dilation of patient's known AV fistula. This was better characterized on dedicated arterial duplex ultrasound performed 1 day ago. No soft tissue gas. No axillary lymphadenopathy. IMPRESSION: 1. Large hyperdense fluid collection within the anterolateral subcutaneous soft tissues of the right upper arm extending from the level of the mid humeral diaphysis to the elbow joint measuring approximately 12.4 x 4.2 x 8.0 cm, most compatible with a hematoma. 2. Diffuse subcutaneous edema throughout the imaged right upper extremity, nonspecific. Cellulitis not excluded in the appropriate clinical setting. 3. 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 This was better characterized on dedicated arterial duplex ultrasound performed 1 day ago. 4. No acute osseous abnormality. Electronically Signed   By: Davina Poke D.O.   On: 03/13/2021 16:49     Medications:   . sodium chloride 75 mL/hr  at 03/15/21 0532  . lactated ringers Stopped (03/14/21 2012)   . sodium chloride   Intravenous Once  . allopurinol  100 mg Oral QPM  . amoxicillin-clavulanate  1 tablet Oral Q12H  . ascorbic acid  250 mg Oral QPC lunch  . atorvastatin  40 mg Oral q morning  . feeding supplement  237 mL Oral BID BM  . fluticasone  2 spray Each Nare Daily  . furosemide  40 mg Oral q morning  . guaiFENesin  600 mg Oral BID  . insulin aspart  0-9 Units  Subcutaneous TID AC & HS  . insulin glargine  25 Units Subcutaneous QPM  . irbesartan  75 mg Oral Daily  . levothyroxine  100 mcg Oral Q0600  . metoprolol succinate  150 mg Oral Q breakfast  . multivitamin with minerals  1 tablet Oral Daily  . pantoprazole  40 mg Oral q morning  . tiotropium  1 capsule Inhalation QPC lunch   acetaminophen **OR** acetaminophen, hydrALAZINE, magnesium hydroxide, morphine injection, ondansetron **OR** ondansetron (ZOFRAN) IV, oxyCODONE-acetaminophen, traZODone  Assessment/ Plan:  Ms. CARISMA SCHUCK is a 87 y.o.  female with past medical history of A-fib on Eliquis, COPD, GERD, diabetes type 2, hypertension and CKD stage 4. She is a current patient of [facility], but has not been to an appointment since July 2021. She presents to ED complaining of swelling and pain in right upper arm.    1. Acute Kidney Injury on chronic kidney disease stage 4 with baseline creatinine 2.28 and GFR of 19 on 03/13/21.  Likely due to blood loss vs anesthesia effects Currently prescribed irbesartan Will order Renal ultrasound to evaluate obstruction and abnornality No IV contrast exposure No acute need for dialysis at this time Continue IVF Will monitor Creatinine and await [place] results  Lab Results  Component Value Date   CREATININE 3.12 (H) 03/15/2021   CREATININE 2.88 (H) 03/14/2021   CREATININE 2.28 (H) 03/13/2021    Intake/Output Summary (Last 24 hours) at 03/15/2021 1424 Last data filed at 03/15/2021 1410 Gross per 24 hour  Intake 2756.98 ml  Output 201 ml  Net 2555.98 ml   2. Anemia of chronic kidney disease No recent outpatient labs available Lab Results  Component Value Date   HGB 7.5 (L) 03/15/2021   Primary team will transfuse RBCs today Will monitor   3. Secondary Hyperparathyroidism:  Lab Results  Component Value Date   CALCIUM 7.7 (L) 03/15/2021  Will obtain updated phosphorus and PTH levels   4. Diabetes mellitus type II with chronic kidney  disease Recent Hgb A1c 7.0 on 03/12/21 Stable glucose during admission    LOS: 2   5/23/20222:24 PM

## 2021-03-15 NOTE — Progress Notes (Signed)
[facility] Daily Progress Note   Subjective: 03/14/21 1. Ligation of right arteriovenous fistula  2. Evacuation of right arm hematoma  Patient with some continued right upper extremity discomfort.  No acute issues overnight.  Objective: Vitals:   03/15/21 0108 03/15/21 0315 03/15/21 0519 03/15/21 0857  BP: (!) 81/56 (!) 117/54 (!) 93/50 (!) 98/54  Pulse: (!) 127 (!) 137 (!) 124 (!) 126  Resp: '20 20 18   '$ Temp: 98 F (36.7 C) (!) 97.4 F (36.3 C) 97.9 F (36.6 C)   TempSrc:      SpO2: 96% 96% 91%   Weight:      Height:        Intake/Output Summary (Last 24 hours) at 03/15/2021 1132 Last data filed at 03/15/2021 0532 Gross per 24 hour  Intake 2626.98 ml  Output 202 ml  Net 2424.98 ml   Physical Exam: A&Ox3, NAD CV: RRR Pulmonary: CTA Bilaterally Abdomen: Soft, Nontender, Nondistended Vascular:  Right upper extremity: Ace wrap removed. Incisions clean dry and intact. Ecchymosis tracking down the arm distally to the wrist. Edematous but soft. Motor/sensory is intact.   Laboratory: CBC    Component Value Date/Time   WBC 9.6 03/15/2021 0502   HGB 7.5 (L) 03/15/2021 0502   HGB 11.5 (L) 11/05/2013 0437   HCT 22.5 (L) 03/15/2021 0502   HCT 34.5 (L) 11/05/2013 0437   PLT 116 (L) 03/15/2021 0502   PLT 186 11/05/2013 0437   BMET    Component Value Date/Time   NA 144 03/15/2021 0502   NA 141 11/07/2013 0535   K 4.7 03/15/2021 0502   K 4.0 11/07/2013 0535   CL 115 (H) 03/15/2021 0502   CL 111 (H) 11/07/2013 0535   CO2 22 03/15/2021 0502   CO2 25 11/07/2013 0535   GLUCOSE 141 (H) 03/15/2021 0502   GLUCOSE 82 11/07/2013 0535   BUN 52 (H) 03/15/2021 0502   BUN 44 (H) 11/07/2013 0535   CREATININE 3.12 (H) 03/15/2021 0502   CREATININE 1.81 (H) 11/07/2013 0535   CALCIUM 7.7 (L) 03/15/2021 0502   CALCIUM 8.4 (L) 11/07/2013 0535   GFRNONAA 14 (L) 03/15/2021 0502   GFRNONAA 26 (L) 11/07/2013 0535   GFRAA 23 (L) 01/09/2020 2054   GFRAA 30 (L)  11/07/2013 0535   Assessment/Planning: The patient is an 87 year old female with multiple medical issues who presented with hematoma to the right upper extremity at the patient's old arterial anastomosis POD#1  1) patient no longer has a functioning right upper extremity dialysis access as it was ligated yesterday by Dr. Bridgett Larsson due to bleeding. 2) we will need to continue to watch the patient's BUNs/creatinine.  Nephrology is following. 3) hemoglobin is 7 5 this AM.  Patient will receive 1 unit packed red blood cell.  CBC status post transfusion and in AM. 4) continue compression and elevation  Discussed with Dr. Ellis Parents [facility] PA-C 03/15/2021 11:32 AM

## 2021-03-15 NOTE — Progress Notes (Signed)
Patient's arm very swollen, pain and red. This is all continuing to get worse. Patient is complaining that her pain is worse. I contacted Dr. Bridgett Larsson about patient who ordered to make patient NPO incase they decide to do surgery today.

## 2021-03-16 ENCOUNTER — Inpatient Hospital Stay: Payer: Medicare Other

## 2021-03-16 DIAGNOSIS — M7989 Other specified soft tissue disorders: Secondary | ICD-10-CM | POA: Diagnosis not present

## 2021-03-16 DIAGNOSIS — T148XXA Other injury of unspecified body region, initial encounter: Secondary | ICD-10-CM | POA: Diagnosis not present

## 2021-03-16 DIAGNOSIS — S45991A Other specified injury of unspecified blood vessel at shoulder and upper arm level, right arm, initial encounter: Secondary | ICD-10-CM | POA: Diagnosis not present

## 2021-03-16 LAB — CBC
HCT: 24.3 % — ABNORMAL LOW (ref 36.0–46.0)
Hemoglobin: 8.4 g/dL — ABNORMAL LOW (ref 12.0–15.0)
MCH: 30.9 pg (ref 26.0–34.0)
MCHC: 34.6 g/dL (ref 30.0–36.0)
MCV: 89.3 fL (ref 80.0–100.0)
Platelets: 129 10*3/uL — ABNORMAL LOW (ref 150–400)
RBC: 2.72 MIL/uL — ABNORMAL LOW (ref 3.87–5.11)
RDW: 15.3 % (ref 11.5–15.5)
WBC: 8.9 10*3/uL (ref 4.0–10.5)
nRBC: 0.2 % (ref 0.0–0.2)

## 2021-03-16 LAB — TYPE AND SCREEN
ABO/RH(D): O POS
Antibody Screen: NEGATIVE
Unit division: 0
Unit division: 0

## 2021-03-16 LAB — GLUCOSE, CAPILLARY
Glucose-Capillary: 82 mg/dL (ref 70–99)
Glucose-Capillary: 103 mg/dL — ABNORMAL HIGH (ref 70–99)
Glucose-Capillary: 90 mg/dL (ref 70–99)
Glucose-Capillary: 134 mg/dL — ABNORMAL HIGH (ref 70–99)

## 2021-03-16 LAB — BPAM RBC
Blood Product Expiration Date: 202205252359
Blood Product Expiration Date: 202206112359
ISSUE DATE / TIME: 202205221303
ISSUE DATE / TIME: 202205231849
Unit Type and Rh: 5100
Unit Type and Rh: 5100

## 2021-03-16 LAB — COMPREHENSIVE METABOLIC PANEL
ALT: 9 U/L (ref 0–44)
AST: 23 U/L (ref 15–41)
Albumin: 2.6 g/dL — ABNORMAL LOW (ref 3.5–5.0)
Alkaline Phosphatase: 76 U/L (ref 38–126)
Anion gap: 6 (ref 5–15)
BUN: 53 mg/dL — ABNORMAL HIGH (ref 8–23)
CO2: 23 mmol/L (ref 22–32)
Calcium: 7.9 mg/dL — ABNORMAL LOW (ref 8.9–10.3)
Chloride: 114 mmol/L — ABNORMAL HIGH (ref 98–111)
Creatinine, Ser: 3.12 mg/dL — ABNORMAL HIGH (ref 0.44–1.00)
GFR, Estimated: 14 mL/min — ABNORMAL LOW (ref >60)
Glucose, Bld: 89 mg/dL (ref 70–99)
Potassium: 4.1 mmol/L (ref 3.5–5.1)
Sodium: 143 mmol/L (ref 135–145)
Total Bilirubin: 1.1 mg/dL (ref 0.3–1.2)
Total Protein: 5.1 g/dL — ABNORMAL LOW (ref 6.5–8.1)

## 2021-03-16 MED ORDER — PEDIALYTE PO SOLN: 240.0000 mL | Freq: Two times a day (BID) | ORAL | Status: DC

## 2021-03-16 MED ORDER — POLYETHYLENE GLYCOL 3350 17 G PO PACK
17.0000 g | PACK | Freq: Every day | ORAL | Status: DC
  Administered 2021-03-16 – 2021-03-18 (×3): 17 g via ORAL
  Filled 2021-03-16 (×3): qty 1

## 2021-03-16 NOTE — Evaluation (Signed)
Occupational Therapy Evaluation Patient Details Name: Denise [name] MRN: [id number] DOB: 11/18/1929 Today's Date: 03/16/2021    History of Present Illness Denise [name] is a 87 y.o. Caucasian female with medical history significant for atrial fibrillation on Eliquis, stage IV chronic kidney disease, COPD, type 2 diabetes mellitus, emphysema, GERD, hypertension, osteoarthritis and hypothyroidism, who presented to the ER with acute onset of right upper extremity swelling and pain after lifting her dog and hearing something popping in her right arm.  This happened at the site of the right upper extremity fistula that was done in 2017 however the patient has not had any dialysis since then. Due to worsening edema and bruising she was taken to the OR by vascular surgery yesterday with resulted evacuation of 300 cc hematoma and ligation of AV fistula.  There was a rupture of AV fistula resulted in a large hematoma.   Clinical Impression   Pt was seen for OT evaluation this date. Prior to hospital admission, pt reports she was independent with basic ADL, using a SPC for home mobility, RW for community mobility, and daughter who lives in same apt complex was assisting with med mgt and transportation. Pt received sleeping, wakes easily to verbal cues, and agreeable to OT. RUE noted to be down by herself (1 pillow underneath but not well elevated). RUE was very edematous, hand was purple in color with fingers being more pale/white. Pt reports decreased sensation (pins and needles) through hand with thumb and index finger most impacted and most painful. (MD in at end of session and endorsed this was significant change since previous date). RN also notified at end of session for pain control. Pt required MOD-MAX A for bed mobility and ultimately was unable to fully stand with assist and SPC. MOD A for lateral scoots EOB. RUE pain and BLE weakness were biggest limiting factors for mobility efforts this date. She  also endorsed lightheadedness after coughing briefly while EOB. Currently pt demonstrates impairments as described below (See OT problem list) which functionally limit her ability to perform ADL/self-care tasks. Pt currently requires MAX A for LB ADL and UB ADL. Pt would benefit from skilled OT services to address noted impairments and functional limitations (see below for any additional details) in order to maximize safety and independence while minimizing falls risk and caregiver burden. Upon hospital discharge, recommend STR to maximize pt safety and return to PLOF.     Follow Up Recommendations  SNF    Equipment Recommendations  None recommended by OT    Recommendations for Other Services       Precautions / Restrictions Precautions Precautions: Fall;Other (comment) Precaution Comments: RUE edematous, keep elevated      Mobility Bed Mobility Overal bed mobility: Needs Assistance Bed Mobility: Supine to Sit;Sit to Supine     Supine to sit: Mod assist;HOB elevated Sit to supine: Max assist   General bed mobility comments: cues for sequencing    Transfers Overall transfer level: Needs assistance Equipment used: Straight cane Transfers: Sit to/from Stand;Lateral/Scoot Transfers          Lateral/Scoot Transfers: Mod assist General transfer comment: pt attempted STS transfers, ultimately unable to complete; was able to perform small lateral scoot transfers EOB to improve positioning requiring MOD A    Balance Overall balance assessment: Needs assistance Sitting-balance support: Feet supported;Single extremity supported Sitting balance-Leahy Scale: Fair  ADL either performed or assessed with clinical judgement   ADL Overall ADL's : Needs assistance/impaired                                       General ADL Comments: Pt currently requires MAX A for LB ADL, MOD A for UB ADL, and set up and supervision  for self feeding and grooming tasks, RUE unable to incorporate into sessions     Vision Patient Visual Report: No change from baseline       Perception     Praxis      Pertinent Vitals/Pain Pain Assessment: 0-10 Pain Score: 8  Pain Location: RUE Pain Descriptors / Indicators: Pins and needles;Sharp;Guarding Pain Intervention(s): Limited activity within patient's tolerance;Monitored during session;Repositioned;Other (comment) (elevated on pillows)     Hand Dominance     Extremity/Trunk Assessment Upper Extremity Assessment Upper Extremity Assessment: RUE deficits/detail;LUE deficits/detail;Generalized weakness RUE Deficits / Details: significantly edematous, purple, pt endorses pins and tingles to hand, thumb and index finger feel "worse" per pt report, unable to form full fist, unable to get RUE shoulder past ~60* flexion RUE: Unable to fully assess due to pain RUE Sensation: decreased light touch RUE Coordination: decreased fine motor;decreased gross motor LUE Deficits / Details: weak, limited shoulder flexion   Lower Extremity Assessment Lower Extremity Assessment: Generalized weakness       Communication     Cognition Arousal/Alertness: Awake/alert Behavior During Therapy: WFL for tasks assessed/performed Overall Cognitive Status: Within Functional Limits for tasks assessed                                 General Comments: follows commands with cues, encouragement 2/2 RUE pain   General Comments  RUE down by her side, not elevated, and hand noted to be significantly adematous and purple, pt endorsing pins and needles to R hand, thumb and index finger worse; RUE elevated with pillows at end of session. Dr. Reesa Chew in room and assessed hand.    Exercises Other Exercises Other Exercises: Bed mobility training, transfer training, RUE positioning for joint protection and edema control   Shoulder Instructions      Home Living Family/patient expects to be  discharged to:: Private residence Living Arrangements: Alone Available Help at Discharge: Family Type of Home: Apartment Home Access: Elevator     Home Layout: One level     Bathroom Shower/Tub: Teacher, early years/pre: Standard     Home Equipment: Cane - single point;Bedside commode;Wheelchair - [employer] - 2 wheels          Prior Functioning/Environment Level of Independence: Needs assistance  Gait / Transfers Assistance Needed: Per pt, using SPC for in home mobility, uses RW for community mobility ADL's / Homemaking Assistance Needed: Pt reports indep with basic ADL; daughter (who lives in same apartment complex) assists wtih med mgt, transportation   Comments: Per pt, 6 falls in 41mo       OT Problem List: Decreased strength;Decreased range of motion;Pain;Impaired UE functional use;Impaired balance (sitting and/or standing);Decreased activity tolerance      OT Treatment/Interventions: Self-care/ADL training;Therapeutic exercise;Therapeutic activities;DME and/or AE instruction;Patient/family education;Balance training    OT Goals(Current goals can be found in the care plan section) Acute Rehab OT Goals Patient Stated Goal: for my arm to feel better OT Goal Formulation: With patient Time For  Goal Achievement: 03/30/21 Potential to Achieve Goals: Good ADL Goals Pt Will Perform Upper Body Dressing: with min assist;sitting Pt Will Perform Lower Body Dressing: with min assist;sit to/from stand Pt Will Transfer to Toilet: with min assist;bedside commode;stand pivot transfer Additional ADL Goal #1: Pt will independently instruct family/caregiver in RUE positioning to support edema mgt and neutral positioning for joint protection  OT Frequency: Min 2X/week   Barriers to D/C:            Co-evaluation              AM-PAC OT "6 Clicks" Daily Activity     Outcome Measure Help from another person eating meals?: A Little Help from another person  taking care of personal grooming?: A Little Help from another person toileting, which includes using toliet, bedpan, or urinal?: A Lot Help from another person bathing (including washing, rinsing, drying)?: A Lot Help from another person to put on and taking off regular upper body clothing?: A Lot Help from another person to put on and taking off regular lower body clothing?: A Lot 6 Click Score: 14   End of Session Equipment Utilized During Treatment: Gait belt  Activity Tolerance: Patient limited by pain Patient left: in bed;with call bell/phone within reach;with bed alarm set;Other (comment) (RUE elevated)  OT Visit Diagnosis: Other abnormalities of gait and mobility (R26.89);Repeated falls (R29.6);Muscle weakness (generalized) (M62.81);Pain Pain - Right/Left: Right Pain - part of body: Arm;Shoulder;Hand                Time: 1014-1050 OT Time Calculation (min): 36 min Charges:  OT General Charges $OT Visit: 1 Visit OT Evaluation $OT Eval Moderate Complexity: 1 Mod OT Treatments $Self Care/Home Management : 23-37 mins  Hanley Hays, MPH, MS, OTR/L ascom [phone] 03/16/21, 12:52 PM

## 2021-03-16 NOTE — Care Management Important Message (Signed)
Important Message  Patient Details  Name: Denise [name] MRN: [id number] Date of Birth: 02-22-1930   Medicare Important Message Given:  Yes     Dannette Barbara 03/16/2021, 11:15 AM

## 2021-03-16 NOTE — Progress Notes (Signed)
PROGRESS NOTE    Denise [name]  [id number] DOB: 09/27/1930 DOA: 03/11/2021 PCP: Perrin Maltese, MD   Brief Narrative: Taken from H&P. Denise Schwegel Mooreis a 87 y.o.Caucasian femalewith medical history significant foratrial fibrillation on Eliquis, stage IV chronic kidney disease, COPD, type2diabetes mellitus, emphysema, GERD, hypertension, osteoarthritis and hypothyroidism, who presented to the ER with acute onset ofright upper extremity swelling and pain after lifting her dog and hearing something popping in her right arm. This happened at the site of the right upper extremity fistula that was done in 2017 however the patient has not had any dialysis since then.  Found to have a hematoma at fistula site.  Vascular surgery was also consulted.  Venous Doppler studies were negative for DVT.  Significant worsening of edema and bruising involving right upper extremity.  No sign of compartment syndrome at this time.  Vascular surgery would like to keep managing conservatively.  They were recommending doing a fistulogram which can not be done over the weekend.  CT right upper extremity shows a large hematoma. Due to worsening edema and bruising she was taken to the OR by vascular surgery yesterday with resulted evacuation of 300 cc hematoma and ligation of AV fistula.  There was a rupture of AV fistula resulted in a large hematoma. Patient received 2 units of PRBC. Worsening right upper extremity edema after some improvement today, some tingling and numbness, pulses intact, sent another message to vascular surgery for reevaluation.  Subjective: Patient was complaining of some tingling and numbness on right upper extremity, significantly worsened edema.  Assessment & Plan:   Active Problems:   Intramural hematoma of artery of right upper extremity   Hematoma   Left arm swelling  Intramural hematoma of right upper extremity.  Venous Doppler studies were negative for DVT.  CT with a large  hematoma, she was taken to the OR for hematoma evacuation and ligation of AV fistula by vascular surgery yesterday.  Clinically seems improving with some improvement in her edema and pain.  She received total of 2 units of PRBC. Hemoglobin improved to 8.4 today. Worsening of right upper extremity edema after improvement today, some tingling and numbness, pulses intact and hand appears warm-high risk for compartment syndrome. -Sent another message to vascular surgery for reevaluation. -Continue to monitor for any sign of compartment syndrome. -Continue with pain management  AKI with CKD stage IV.  Creatinine seems stable with no improvement today. Nephrology consult-patient will not be able to use her prior fistula as it was ligated. -Renal ultrasound-no need for emergent dialysis at this time. -Monitor renal function -Avoid nephrotoxins  Hypertension.  Blood pressure within goal. -Continue with Toprol-XL, irbesartan and as needed IV hydralazine  Dyslipidemia. -Continue with home dose of statin.  Type 2 diabetes mellitus.  A1c of 7.  CBG within goal -Continue with SSI -Continue with Lantus 25 units at bedtime  Acute sinusitis. -Continue with Augmentin to complete a 5-day course. -Continue with supportive care  Dementia and depression. -Continue with Aricept and Lexapro  Paroxysmal atrial fibrillation.  Currently in sinus rhythm. -Continue home dose of metoprolol -Home dose of Eliquis is on hold due to worsening of bruising and hematoma.  Objective: Vitals:   03/15/21 2230 03/16/21 0510 03/16/21 0817 03/16/21 1156  BP: (!) 123/53 (!) 152/58 127/63 (!) 123/99  Pulse: 60 74 (!) 51 (!) 59  Resp: '18 16 16 16  '$ Temp: 97.7 F (36.5 C) 98.1 F (36.7 C) 97.9 F (36.6 C) 97.8 F (36.6  C)  TempSrc: Oral  Oral   SpO2: 95%  98% 97%  Weight:      Height:        Intake/Output Summary (Last 24 hours) at 03/16/2021 1340 Last data filed at 03/16/2021 1012 Gross per 24 hour  Intake  2111.93 ml  Output 1200 ml  Net 911.93 ml   Filed Weights   03/11/21 2348  Weight: 77.1 kg    Examination:  General.  Well-developed elderly lady, in no acute distress. Pulmonary.  Lungs clear bilaterally, normal respiratory effort. CV.  Regular rate and rhythm, no JVD, rub or murmur. Abdomen.  Soft, nontender, nondistended, BS positive. CNS.  Alert and oriented x3.  No focal neurologic deficit. Extremities.  Significant edema of right upper extremity, pulses intact and symmetrical. Psychiatry.  Judgment and insight appears normal.  DVT prophylaxis: SCDs Code Status: Full Family Communication: Called daughter with no response. Disposition Plan:  Status is: Inpatient  Remains inpatient appropriate because:Inpatient level of care appropriate due to severity of illness   Dispo: The patient is from: Home              Anticipated d/c is to: Home              Patient currently is not medically stable to d/c.   Difficult to place patient No               Level of care: Med-Surg  All the records are reviewed and case discussed with Care Management/Social Worker. Management plans discussed with the patient, nursing and they are in agreement.  Consultants:   Vascular surgery  Procedures:  Antimicrobials:   Data Reviewed: I have personally reviewed following labs and imaging studies  CBC: Recent Labs  Lab 03/13/21 0137 03/14/21 0251 03/14/21 0507 03/15/21 0502 03/16/21 0447  WBC 10.0 9.0 9.2 9.6 8.9  HGB 9.4* 7.1* 7.1* 7.5* 8.4*  HCT 29.1* 21.9* 22.1* 22.5* 24.3*  MCV 93.3 93.6 93.2 92.2 89.3  PLT 139* 123* 117* 116* [id number]*   Basic Metabolic Panel: Recent Labs  Lab 03/12/21 0608 03/13/21 0137 03/14/21 0251 03/15/21 0502 03/16/21 0447  NA 144 144 142 144 143  K 3.9 3.4* 4.7 4.7 4.1  CL 110 111 112* 115* 114*  CO2 '28 26 24 22 23  '$ GLUCOSE 123* 76 113* 141* 89  BUN 35* 32* 44* 52* 53*  CREATININE 2.53* 2.28* 2.88* 3.12* 3.12*  CALCIUM 8.7* 8.3* 8.0* 7.7* 7.9*   MG  --  2.0  --   --   --    GFR: Estimated Creatinine Clearance: 13.1 mL/min (A) (by C-G formula based on SCr of 3.12 mg/dL (H)). Liver Function Tests: Recent Labs  Lab 03/12/21 0037 03/16/21 0447  AST 21 23  ALT 11 9  ALKPHOS 111 76  BILITOT 0.8 1.1  PROT 6.1* 5.1*  ALBUMIN 3.3* 2.6*   No results for input(s): LIPASE, AMYLASE in the last 168 hours. No results for input(s): AMMONIA in the last 168 hours. Coagulation Profile: Recent Labs  Lab 03/12/21 0037  INR 1.4*   Cardiac Enzymes: No results for input(s): CKTOTAL, CKMB, CKMBINDEX, TROPONINI in the last 168 hours. BNP (last 3 results) No results for input(s): PROBNP in the last 8760 hours. HbA1C: No results for input(s): HGBA1C in the last 72 hours. CBG: Recent Labs  Lab 03/15/21 1143 03/15/21 1604 03/15/21 2123 03/16/21 0738 03/16/21 1157  GLUCAP 152* 143* 146* 82 103*   Lipid Profile: No results for input(s): CHOL, HDL, LDLCALC,  TRIG, CHOLHDL, LDLDIRECT in the last 72 hours. Thyroid Function Tests: No results for input(s): TSH, T4TOTAL, FREET4, T3FREE, THYROIDAB in the last 72 hours. Anemia Panel: No results for input(s): VITAMINB12, FOLATE, FERRITIN, TIBC, IRON, RETICCTPCT in the last 72 hours. Sepsis Labs: No results for input(s): PROCALCITON, LATICACIDVEN in the last 168 hours.  Recent Results (from the past 240 hour(s))  Resp Panel by RT-PCR (Flu A&B, Covid) Nasopharyngeal Swab     Status: None   Collection Time: 03/12/21 12:40 AM   Specimen: Nasopharyngeal Swab; Nasopharyngeal(NP) swabs in vial transport medium  Result Value Ref Range Status   SARS Coronavirus 2 by RT PCR NEGATIVE NEGATIVE Final    Comment: (NOTE) SARS-CoV-2 target nucleic acids are NOT DETECTED.  The SARS-CoV-2 RNA is generally detectable in upper respiratory specimens during the acute phase of infection. The lowest concentration of SARS-CoV-2 viral copies this assay can detect is 138 copies/mL. A negative result does not  preclude SARS-Cov-2 infection and should not be used as the sole basis for treatment or other patient management decisions. A negative result may occur with  improper specimen collection/handling, submission of specimen other than nasopharyngeal swab, presence of viral mutation(s) within the areas targeted by this assay, and inadequate number of viral copies(<138 copies/mL). A negative result must be combined with clinical observations, patient history, and epidemiological information. The expected result is Negative.  Fact Sheet for Patients:  EntrepreneurPulse.com.au  Fact Sheet for Healthcare Providers:  IncredibleEmployment.be  This test is no t yet approved or cleared by the [place] FDA and  has been authorized for detection and/or diagnosis of SARS-CoV-2 by FDA under an Emergency Use Authorization (EUA). This EUA will remain  in effect (meaning this test can be used) for the duration of the COVID-19 declaration under Section 564(b)(1) of the Act, 21 U.S.C.section 360bbb-3(b)(1), unless the authorization is terminated  or revoked sooner.       Influenza A by PCR NEGATIVE NEGATIVE Final   Influenza B by PCR NEGATIVE NEGATIVE Final    Comment: (NOTE) The Xpert Xpress SARS-CoV-2/FLU/RSV plus assay is intended as an aid in the diagnosis of influenza from Nasopharyngeal swab specimens and should not be used as a sole basis for treatment. Nasal washings and aspirates are unacceptable for Xpert Xpress SARS-CoV-2/FLU/RSV testing.  Fact Sheet for Patients: EntrepreneurPulse.com.au  Fact Sheet for Healthcare Providers: IncredibleEmployment.be  This test is not yet approved or cleared by the [place] FDA and has been authorized for detection and/or diagnosis of SARS-CoV-2 by FDA under an Emergency Use Authorization (EUA). This EUA will remain in effect (meaning this test can be used) for the  duration of the COVID-19 declaration under Section 564(b)(1) of the Act, 21 U.S.C. section 360bbb-3(b)(1), unless the authorization is terminated or revoked.  Performed at [facility], [address] [place], [place] [address]      Radiology Studies: US RENAL  Result Date: 03/16/2021 CLINICAL DATA:  Acute renal insufficiency. EXAM: RENAL / URINARY TRACT ULTRASOUND COMPLETE COMPARISON:  CT 07/25/2015. FINDINGS: Right Kidney: Renal measurements: 8.9 x 3.4 x 3.4 cm = volume: 54.6 mL. Cortical thinning. Echogenicity within normal limits. 1.6 cm simple cyst. No hydronephrosis visualized. Tiny amount of right perirenal fluid cannot be excluded. Left Kidney: Renal measurements: 9.6 x 4.5 x 3.4 cm = volume: 75.7 mL. Cortical thinning. Echogenicity within normal limits. 1.1 cm simple cyst. No hydronephrosis visualized. Bladder: Appears normal for degree of bladder distention. Other: None. IMPRESSION: 1. Bilateral renal cortical thinning. Tiny amount of  right perirenal fluid cannot be excluded. No hydronephrosis or bladder distention. 2.  Bilateral simple renal cysts. Electronically Signed   By: Marcello Moores  Register   On: 03/16/2021 05:13    Scheduled Meds: . allopurinol  100 mg Oral QPM  . amoxicillin-clavulanate  1 tablet Oral Q12H  . ascorbic acid  250 mg Oral QPC lunch  . atorvastatin  40 mg Oral q morning  . feeding supplement  237 mL Oral BID BM  . fluticasone  2 spray Each Nare Daily  . furosemide  40 mg Oral q morning  . guaiFENesin  600 mg Oral BID  . insulin aspart  0-9 Units Subcutaneous TID AC & HS  . insulin glargine  25 Units Subcutaneous QPM  . irbesartan  75 mg Oral Daily  . levothyroxine  100 mcg Oral Q0600  . metoprolol succinate  25 mg Oral Q breakfast  . multivitamin with minerals  1 tablet Oral Daily  . pantoprazole  40 mg Oral q morning  . polyethylene glycol  17 g Oral Daily  . tiotropium  1 capsule Inhalation QPC lunch   Continuous Infusions: . sodium  chloride 75 mL/hr at 03/16/21 0749  . lactated ringers Stopped (03/14/21 2012)     LOS: 3 days   Time spent: 33 minutes. More than 50% of the time was spent in counseling/coordination of care  Lorella Nimrod, MD [facility]  If 7PM-7AM, please contact night-coverage [email or web address]  03/16/2021, 1:40 PM   This record has been created using Systems analyst. Errors have been sought and corrected,but may not always be located. Such creation errors do not reflect on the standard of care.

## 2021-03-16 NOTE — Progress Notes (Addendum)
[facility]  ROUNDING NOTE   Subjective:   Denise [name] is a 87 y.o. female with past medical history of A-fib on Eliquis, COPD, GERD, diabetes type 2, hypertension and CKD stage 4. She is a current patient of [facility], but has not been to an appointment since July 2021. She presents to ED complaining of swelling and pain in right upper arm. She currently has a AVF at that site. She reported lifting her dog and hearing a pop.   Fistula was placed in 2017 presuming the patient would need dialysis in the near future. AVF has not been used as of date.   Patient seen laying in bed Breakfast tray at bedside Denies pain and discomfort No complaints at this time   Objective:  Vital signs in last 24 hours:  Temp:  [97.7 F (36.5 C)-98.1 F (36.7 C)] 97.9 F (36.6 C) (05/24 0817) Pulse Rate:  [51-77] 51 (05/24 0817) Resp:  [16-18] 16 (05/24 0817) BP: (116-152)/(45-63) 127/63 (05/24 0817) SpO2:  [91 %-99 %] 98 % (05/24 0817)  Weight change:  Filed Weights   03/11/21 2348  Weight: 77.1 kg    Intake/Output: I/O last 3 completed shifts: In: 2440.4 [P.O.:240; I.V.:1590.4; Blood:610] Out: 1400 [Urine:1400]   Intake/Output this shift:  No intake/output data recorded.  Physical Exam: General: NAD, laying in bed  Head: Normocephalic, atraumatic. Moist oral mucosal membranes  Eyes: Anicteric  Lungs:  Clear to auscultation, normal breathing effort  Heart: Regular rate and rhythm  Abdomen:  Soft, nontender  Extremities:  no peripheral edema. Rt arm surgical dressing  Neurologic: Alert and oriented, confused and forgetful at times, moving all four extremities  Skin: No lesions, Rt hand ecchymotic   Access: Rt AVF    Basic Metabolic Panel: Recent Labs  Lab 03/12/21 0608 03/13/21 0137 03/14/21 0251 03/15/21 0502 03/16/21 0447  NA 144 144 142 144 143  K 3.9 3.4* 4.7 4.7 4.1  CL 110 111 112* 115* 114*  CO2 '28 26 24 22 23  '$ GLUCOSE 123* 76 113* 141* 89  BUN 35* 32* 44*  52* 53*  CREATININE 2.53* 2.28* 2.88* 3.12* 3.12*  CALCIUM 8.7* 8.3* 8.0* 7.7* 7.9*  MG  --  2.0  --   --   --     Liver Function Tests: Recent Labs  Lab 03/12/21 0037 03/16/21 0447  AST 21 23  ALT 11 9  ALKPHOS 111 76  BILITOT 0.8 1.1  PROT 6.1* 5.1*  ALBUMIN 3.3* 2.6*   No results for input(s): LIPASE, AMYLASE in the last 168 hours. No results for input(s): AMMONIA in the last 168 hours.  CBC: Recent Labs  Lab 03/13/21 0137 03/14/21 0251 03/14/21 0507 03/15/21 0502 03/16/21 0447  WBC 10.0 9.0 9.2 9.6 8.9  HGB 9.4* 7.1* 7.1* 7.5* 8.4*  HCT 29.1* 21.9* 22.1* 22.5* 24.3*  MCV 93.3 93.6 93.2 92.2 89.3  PLT 139* 123* 117* 116* 129*    Cardiac Enzymes: No results for input(s): CKTOTAL, CKMB, CKMBINDEX, TROPONINI in the last 168 hours.  BNP: Invalid input(s): POCBNP  CBG: Recent Labs  Lab 03/15/21 0737 03/15/21 1143 03/15/21 1604 03/15/21 2123 03/16/21 0738  GLUCAP 113* 152* 143* 146* 82    Microbiology: Results for orders placed or performed during the hospital encounter of 03/11/21  Resp Panel by RT-PCR (Flu A&B, Covid) Nasopharyngeal Swab     Status: None   Collection Time: 03/12/21 12:40 AM   Specimen: Nasopharyngeal Swab; Nasopharyngeal(NP) swabs in vial transport medium  Result Value  Ref Range Status   SARS Coronavirus 2 by RT PCR NEGATIVE NEGATIVE Final    Comment: (NOTE) SARS-CoV-2 target nucleic acids are NOT DETECTED.  The SARS-CoV-2 RNA is generally detectable in upper respiratory specimens during the acute phase of infection. The lowest concentration of SARS-CoV-2 viral copies this assay can detect is 138 copies/mL. A negative result does not preclude SARS-Cov-2 infection and should not be used as the sole basis for treatment or other patient management decisions. A negative result may occur with  improper specimen collection/handling, submission of specimen other than nasopharyngeal swab, presence of viral mutation(s) within the areas  targeted by this assay, and inadequate number of viral copies(<138 copies/mL). A negative result must be combined with clinical observations, patient history, and epidemiological information. The expected result is Negative.  Fact Sheet for Patients:  EntrepreneurPulse.com.au  Fact Sheet for Healthcare Providers:  IncredibleEmployment.be  This test is no t yet approved or cleared by the [place] FDA and  has been authorized for detection and/or diagnosis of SARS-CoV-2 by FDA under an Emergency Use Authorization (EUA). This EUA will remain  in effect (meaning this test can be used) for the duration of the COVID-19 declaration under Section 564(b)(1) of the Act, 21 U.S.C.section 360bbb-3(b)(1), unless the authorization is terminated  or revoked sooner.       Influenza A by PCR NEGATIVE NEGATIVE Final   Influenza B by PCR NEGATIVE NEGATIVE Final    Comment: (NOTE) The Xpert Xpress SARS-CoV-2/FLU/RSV plus assay is intended as an aid in the diagnosis of influenza from Nasopharyngeal swab specimens and should not be used as a sole basis for treatment. Nasal washings and aspirates are unacceptable for Xpert Xpress SARS-CoV-2/FLU/RSV testing.  Fact Sheet for Patients: EntrepreneurPulse.com.au  Fact Sheet for Healthcare Providers: IncredibleEmployment.be  This test is not yet approved or cleared by the [place] FDA and has been authorized for detection and/or diagnosis of SARS-CoV-2 by FDA under an Emergency Use Authorization (EUA). This EUA will remain in effect (meaning this test can be used) for the duration of the COVID-19 declaration under Section 564(b)(1) of the Act, 21 U.S.C. section 360bbb-3(b)(1), unless the authorization is terminated or revoked.  Performed at [facility], [address] [place], [place] [address]     Coagulation Studies: No results for input(s): LABPROT,  INR in the last 72 hours.  Urinalysis: No results for input(s): COLORURINE, LABSPEC, PHURINE, GLUCOSEU, HGBUR, BILIRUBINUR, KETONESUR, PROTEINUR, UROBILINOGEN, NITRITE, LEUKOCYTESUR in the last 72 hours.  Invalid input(s): APPERANCEUR    Imaging: US RENAL  Result Date: 03/16/2021 CLINICAL DATA:  Acute renal insufficiency. EXAM: RENAL / URINARY TRACT ULTRASOUND COMPLETE COMPARISON:  CT 07/25/2015. FINDINGS: Right Kidney: Renal measurements: 8.9 x 3.4 x 3.4 cm = volume: 54.6 mL. Cortical thinning. Echogenicity within normal limits. 1.6 cm simple cyst. No hydronephrosis visualized. Tiny amount of right perirenal fluid cannot be excluded. Left Kidney: Renal measurements: 9.6 x 4.5 x 3.4 cm = volume: 75.7 mL. Cortical thinning. Echogenicity within normal limits. 1.1 cm simple cyst. No hydronephrosis visualized. Bladder: Appears normal for degree of bladder distention. Other: None. IMPRESSION: 1. Bilateral renal cortical thinning. Tiny amount of right perirenal fluid cannot be excluded. No hydronephrosis or bladder distention. 2.  Bilateral simple renal cysts. Electronically Signed   By: Marcello Moores  Register   On: 03/16/2021 05:13     Medications:   . sodium chloride 75 mL/hr at 03/16/21 0749  . lactated ringers Stopped (03/14/21 2012)   . allopurinol  100 mg Oral  QPM  . amoxicillin-clavulanate  1 tablet Oral Q12H  . ascorbic acid  250 mg Oral QPC lunch  . atorvastatin  40 mg Oral q morning  . feeding supplement  237 mL Oral BID BM  . fluticasone  2 spray Each Nare Daily  . furosemide  40 mg Oral q morning  . guaiFENesin  600 mg Oral BID  . insulin aspart  0-9 Units Subcutaneous TID AC & HS  . insulin glargine  25 Units Subcutaneous QPM  . irbesartan  75 mg Oral Daily  . levothyroxine  100 mcg Oral Q0600  . metoprolol succinate  25 mg Oral Q breakfast  . multivitamin with minerals  1 tablet Oral Daily  . pantoprazole  40 mg Oral q morning  . tiotropium  1 capsule Inhalation QPC lunch    acetaminophen **OR** acetaminophen, hydrALAZINE, magnesium hydroxide, morphine injection, ondansetron **OR** ondansetron (ZOFRAN) IV, oxyCODONE-acetaminophen, traZODone  Assessment/ Plan:  Denise [name] is a 87 y.o.  female with past medical history of A-fib on Eliquis, COPD, GERD, diabetes type 2, hypertension and CKD stage 4. She is a current patient of [facility], but has not been to an appointment since July 2021. She presents to ED complaining of swelling and pain in right upper arm.    1. Acute Kidney Injury on chronic kidney disease stage 4 with baseline creatinine 2.28 and GFR of 19 on 03/13/21.  Likely due to blood loss vs anesthesia effects Vascular surgery performed ligation and evacuation of hematoma in right arm on 03/14/21. Fistula now tied off. Currently prescribed irbesartan Will order Renal ultrasound to evaluate obstruction and abnornality No acute need for dialysis at this time No improvement with creatinine today Continues to have adequate UOP Continue IVF for now Will require follow up with nephrology at discharge  Lab Results  Component Value Date   CREATININE 3.12 (H) 03/16/2021   CREATININE 3.12 (H) 03/15/2021   CREATININE 2.88 (H) 03/14/2021    Intake/Output Summary (Last 24 hours) at 03/16/2021 0934 Last data filed at 03/16/2021 0500 Gross per 24 hour  Intake 1751.93 ml  Output 1200 ml  Net 551.93 ml   2. Anemia of chronic kidney disease No recent outpatient labs available Lab Results  Component Value Date   HGB 8.4 (L) 03/16/2021   Received transfusion yesterday Improved level today   3. Secondary Hyperparathyroidism:  Lab Results  Component Value Date   CALCIUM 7.9 (L) 03/16/2021  Calcium below target  Will monitor with improved appetite  4. Diabetes mellitus type II with chronic kidney disease Recent Hgb A1c 7.0 on 03/12/21 Stable glucose     LOS: 3   5/24/20229:34 AM

## 2021-03-16 NOTE — Progress Notes (Signed)
[facility]  Daily Progress Note   Subjective  -   Significant bruising and swelling in right arm. Pain improved but still present. No weeping. No fever or chills  Objective Vitals:   03/15/21 2230 03/16/21 0510 03/16/21 0817 03/16/21 1156  BP: (!) 123/53 (!) 152/58 127/63 (!) 123/99  Pulse: 60 74 (!) 51 (!) 59  Resp: '18 16 16 16  '$ Temp: 97.7 F (36.5 C) 98.1 F (36.7 C) 97.9 F (36.6 C) 97.8 F (36.6 C)  TempSrc: Oral  Oral   SpO2: 95%  98% 97%  Weight:      Height:        Intake/Output Summary (Last 24 hours) at 03/16/2021 1447 Last data filed at 03/16/2021 1012 Gross per 24 hour  Intake 1871.93 ml  Output 1200 ml  Net 671.93 ml    PULM  CTAB CV  bradycardic VASC  Right arm bruised and has 2-3+ swelling. Incisions are C/D/I. No active bleeding or weeping.  Laboratory CBC    Component Value Date/Time   WBC 8.9 03/16/2021 0447   HGB 8.4 (L) 03/16/2021 0447   HGB 11.5 (L) 11/05/2013 0437   HCT 24.3 (L) 03/16/2021 0447   HCT 34.5 (L) 11/05/2013 0437   PLT 129 (L) 03/16/2021 0447   PLT 186 11/05/2013 0437    BMET    Component Value Date/Time   NA 143 03/16/2021 0447   NA 141 11/07/2013 0535   K 4.1 03/16/2021 0447   K 4.0 11/07/2013 0535   CL 114 (H) 03/16/2021 0447   CL 111 (H) 11/07/2013 0535   CO2 23 03/16/2021 0447   CO2 25 11/07/2013 0535   GLUCOSE 89 03/16/2021 0447   GLUCOSE 82 11/07/2013 0535   BUN 53 (H) 03/16/2021 0447   BUN 44 (H) 11/07/2013 0535   CREATININE 3.12 (H) 03/16/2021 0447   CREATININE 1.81 (H) 11/07/2013 0535   CALCIUM 7.9 (L) 03/16/2021 0447   CALCIUM 8.4 (L) 11/07/2013 0535   GFRNONAA 14 (L) 03/16/2021 0447   GFRNONAA 26 (L) 11/07/2013 0535   GFRAA 23 (L) 01/09/2020 2054   GFRAA 30 (L) 11/07/2013 0535    Assessment/Planning: POD #2 s/p evacuation of hematoma and ligation of AVF   Doing ok  Right arm bruising and swelling not unexpected and will take days/weeks to resolve  Elevate as much as  tolerated  Took ace wrap off today and will leave off until tomorrow.  Ok to begin using arm as tolerated, but will be weak at first    [employer]  03/16/2021, 2:47 PM

## 2021-03-16 NOTE — Progress Notes (Addendum)
Mobility Specialist - Progress Note   03/16/21 1644  Mobility  Activity Turned to left side;Turned to back (supine)  Level of Assistance Maximum assist, patient does 25-49%  Assistive Device None  Distance Ambulated (ft) 0 ft  Mobility Response Tolerated well  Mobility performed by Mobility specialist  $Mobility charge 1 Mobility    Pt on bedpan upon arrival. NT in with mobility tech for peri-care assist. Mod-MaxA for bed mobility to roll onto L side. Difficulty holding position with self only. Repositioned to [facility] for comfort, RUE elevated. Pt motivated to attempt transfer to [facility], but unsafe to do so at this time.    Kathee Delton Mobility Specialist 03/16/21, 4:48 PM

## 2021-03-16 NOTE — Progress Notes (Signed)
Patient oriented to self only but easily redirected. IV on posterior L arm infiltrated, patient a hard stick, notified NP Randol Kern per secure message and agreed that it was ok for patient to get IV in the morning when IV team came in and hold NS 0.9%. New IV put in at 0734 on L AC. Patient refused pain meds throughout night.

## 2021-03-16 NOTE — Progress Notes (Signed)
   03/16/21 1230  Clinical Encounter Type  Visited With Patient  Visit Type Initial;Spiritual support;Social support  Referral From Nurse  Consult/Referral To [place] was requested by the nurse to visit PT. Chaplain interacted with PT from bedside. PT spoke of her recent health challenges, in addition used storytelling to speak about her upbringing, and her family. Chaplain ministered with presence, and active listening. PT appeared sleepy, so chaplain will follow back up with the PT.

## 2021-03-16 NOTE — Progress Notes (Signed)
PT Cancellation Note  Patient Details Name: Denise [name] MRN: [id number] DOB: [birth year]   Cancelled Treatment:    Reason Eval/Treat Not Completed: Other (comment). Per OT and MD note this PM, pt with increased RUE swelling, MD to consult with interdisciplinary teams for re-evaluation.    Lieutenant Diego PT, DPT 2:35 PM,03/16/21

## 2021-03-17 DIAGNOSIS — J019 Acute sinusitis, unspecified: Secondary | ICD-10-CM

## 2021-03-17 DIAGNOSIS — N179 Acute kidney failure, unspecified: Secondary | ICD-10-CM

## 2021-03-17 DIAGNOSIS — R531 Weakness: Secondary | ICD-10-CM

## 2021-03-17 DIAGNOSIS — E1169 Type 2 diabetes mellitus with other specified complication: Secondary | ICD-10-CM

## 2021-03-17 DIAGNOSIS — N189 Chronic kidney disease, unspecified: Secondary | ICD-10-CM

## 2021-03-17 DIAGNOSIS — E785 Hyperlipidemia, unspecified: Secondary | ICD-10-CM

## 2021-03-17 DIAGNOSIS — G4700 Insomnia, unspecified: Secondary | ICD-10-CM | POA: Diagnosis not present

## 2021-03-17 DIAGNOSIS — T148XXA Other injury of unspecified body region, initial encounter: Secondary | ICD-10-CM | POA: Diagnosis not present

## 2021-03-17 LAB — GLUCOSE, CAPILLARY
Glucose-Capillary: 98 mg/dL (ref 70–99)
Glucose-Capillary: 94 mg/dL (ref 70–99)
Glucose-Capillary: 90 mg/dL (ref 70–99)
Glucose-Capillary: 91 mg/dL (ref 70–99)

## 2021-03-17 LAB — MAGNESIUM: Magnesium: 2.2 mg/dL (ref 1.7–2.4)

## 2021-03-17 LAB — PTH, INTACT AND CALCIUM
Calcium, Total (PTH): 7.7 mg/dL — ABNORMAL LOW (ref 8.7–10.3)
PTH: 81 pg/mL — ABNORMAL HIGH (ref 15–65)

## 2021-03-17 MED ORDER — OXYCODONE-ACETAMINOPHEN 5-325 MG PO TABS: 1.0000 | ORAL_TABLET | Freq: Four times a day (QID) | ORAL | Status: DC | PRN

## 2021-03-17 MED ORDER — LORAZEPAM 2 MG/ML IJ SOLN
1.0000 mg | Freq: Four times a day (QID) | INTRAMUSCULAR | Status: DC | PRN
  Administered 2021-03-17: 1 mg via INTRAMUSCULAR
  Filled 2021-03-17: qty 1

## 2021-03-17 MED ORDER — DEXTROSE 50 % IV SOLN
25.0000 g | Freq: Once | INTRAVENOUS | Status: DC
  Filled 2021-03-17: qty 50

## 2021-03-17 MED ORDER — INSULIN GLARGINE 100 UNIT/ML ~~LOC~~ SOLN
8.0000 [IU] | Freq: Every evening | SUBCUTANEOUS | Status: DC
  Filled 2021-03-17: qty 0.08

## 2021-03-17 MED ORDER — HALOPERIDOL LACTATE 5 MG/ML IJ SOLN
1.0000 mg | Freq: Four times a day (QID) | INTRAMUSCULAR | Status: DC | PRN
  Filled 2021-03-17: qty 1

## 2021-03-17 MED ORDER — INSULIN GLARGINE 100 UNIT/ML ~~LOC~~ SOLN
10.0000 [IU] | Freq: Every evening | SUBCUTANEOUS | Status: DC
  Filled 2021-03-17: qty 0.1

## 2021-03-17 MED ORDER — TRAZODONE HCL 50 MG PO TABS: 50.0000 mg | ORAL_TABLET | Freq: Every day | ORAL | Status: DC

## 2021-03-17 MED ORDER — MORPHINE SULFATE (PF) 2 MG/ML IV SOLN
1.0000 mg | INTRAVENOUS | Status: DC | PRN
  Administered 2021-03-18: 1 mg via INTRAVENOUS
  Filled 2021-03-17: qty 1

## 2021-03-17 MED ORDER — DEXTROSE 250 MG/ML IV SOLN: 25.0000 g | Freq: Once | INTRAVENOUS | Status: DC

## 2021-03-17 NOTE — NC FL2 (Signed)
[place] LEVEL OF CARE SCREENING TOOL     IDENTIFICATION  Patient Name: Denise [name] Birthdate: Dec 08, 1929 Sex: female Admission Date (Current Location): 03/11/2021  [place] and [place] Number:  Engineering geologist and Address:  [facility], [address] [place], [place] [address]      Provider Number: [id number]  Attending Physician Name and Address:  Loletha Grayer, MD  Relative Name and Phone Number:       Current Level of Care: Hospital Recommended Level of Care: [place] Prior Approval Number:    Date Approved/Denied:   PASRR Number: [id number]  Discharge Plan: SNF    Current Diagnoses: Patient Active Problem List   Diagnosis Date Noted  . Left arm swelling   . Intramural hematoma of artery of right upper extremity 03/12/2021  . Hematoma   . Sinus pause 11/30/2019  . PAF (paroxysmal atrial fibrillation) ([place]) 11/30/2019  . Atrial fibrillation with RVR ([place]) 11/28/2019  . ESRD on dialysis ([place]) 04/11/2017  . Hyperlipidemia 01/05/2017  . Diabetes mellitus type 2 with complications ([place]) [id number]  . Chronic kidney disease 01/05/2017  . Intertrochanteric fracture of right hip ([place]) 08/03/2015  . Hypoglycemia secondary to sulfonylurea 07/29/2015    Orientation RESPIRATION BLADDER Height & Weight     Self,Situation  Normal Continent,External catheter Weight: 170 lb (77.1 kg) Height:  '5\' 6"'$  (167.6 cm)  BEHAVIORAL SYMPTOMS/MOOD NEUROLOGICAL BOWEL NUTRITION STATUS  Other (Comment) (Restless)  (None) Continent Diet (Carb modified)  AMBULATORY STATUS COMMUNICATION OF NEEDS Skin   Extensive Assist Verbally Bruising,Surgical wounds (Incision on right arm: Compression wrap.)                       Personal Care Assistance Level of Assistance  Bathing,Feeding,Dressing Bathing Assistance: Maximum assistance Feeding assistance: Maximum assistance Dressing Assistance: Maximum assistance      Functional Limitations Info  Sight,Hearing,Speech Sight Info: Adequate Hearing Info: Adequate Speech Info: Adequate    SPECIAL CARE FACTORS FREQUENCY  PT (By licensed PT),OT (By licensed OT)     PT Frequency: 5 x week OT Frequency: 5 x week            Contractures Contractures Info: Not present    Additional Factors Info  Code Status,Allergies Code Status Info: Full code Allergies Info: Iodinated Diagnostic Agents, Benzocaine-chloroxylenol-hc, Benzonatate           Current Medications (03/17/2021):  This is the current hospital active medication list Current Facility-Administered Medications  Medication Dose Route Frequency Provider Last Rate Last Admin  . acetaminophen (TYLENOL) tablet 650 mg  650 mg Oral Q6H PRN Mansy, Jan A, MD   650 mg at 03/14/21 2022   Or  . acetaminophen (TYLENOL) suppository 650 mg  650 mg Rectal Q6H PRN Mansy, Jan A, MD      . allopurinol (ZYLOPRIM) tablet 100 mg  100 mg Oral QPM Mansy, Jan A, MD   100 mg at 03/16/21 1824  . amoxicillin-clavulanate (AUGMENTIN) 500-125 MG per tablet 500 mg  1 tablet Oral Q12H Lorella Nimrod, MD   500 mg at 03/16/21 2235  . ascorbic acid (VITAMIN C) tablet 250 mg  250 mg Oral QPC lunch Mansy, Jan A, MD   250 mg at 03/16/21 1222  . atorvastatin (LIPITOR) tablet 40 mg  40 mg Oral q morning Mansy, Jan A, MD   40 mg at 03/16/21 1222  . feeding supplement (ENSURE ENLIVE / ENSURE PLUS) liquid 237 mL  237 mL  Oral BID BM Lorella Nimrod, MD   237 mL at 03/15/21 1356  . fluticasone (FLONASE) 50 MCG/ACT nasal spray 2 spray  2 spray Each Nare Daily Mansy, Jan A, MD   2 spray at 03/16/21 1210  . furosemide (LASIX) tablet 40 mg  40 mg Oral q morning Mansy, Jan A, MD   40 mg at 03/16/21 1221  . guaiFENesin (MUCINEX) 12 hr tablet 600 mg  600 mg Oral BID Mansy, Jan A, MD   600 mg at 03/16/21 2235  . haloperidol lactate (HALDOL) injection 1 mg  1 mg Intramuscular Q6H PRN Mansy, Jan A, MD      . hydrALAZINE (APRESOLINE) injection 10  mg  10 mg Intravenous Q6H PRN Mansy, Jan A, MD   10 mg at 03/17/21 0454  . insulin aspart (novoLOG) injection 0-9 Units  0-9 Units Subcutaneous TID [facility] Mansy, Arvella Merles, MD   1 Units at 03/15/21 2228  . insulin glargine (LANTUS) injection 25 Units  25 Units Subcutaneous QPM Mansy, Arvella Merles, MD   25 Units at 03/16/21 1828  . irbesartan (AVAPRO) tablet 75 mg  75 mg Oral Daily Mansy, Jan A, MD   75 mg at 03/16/21 1208  . lactated ringers bolus 250 mL  250 mL Intravenous Once Sharion Settler, NP   Stopped at 03/14/21 2012  . levothyroxine (SYNTHROID) tablet 100 mcg  100 mcg Oral Q0600 Mansy, Jan A, MD   100 mcg at 03/17/21 0530  . LORazepam (ATIVAN) injection 1 mg  1 mg Intramuscular Q6H PRN Mansy, Jan A, MD   1 mg at 03/17/21 0351  . magnesium hydroxide (MILK OF MAGNESIA) suspension 30 mL  30 mL Oral Daily PRN Mansy, Jan A, MD      . metoprolol succinate (TOPROL-XL) 24 hr tablet 25 mg  25 mg Oral Q breakfast Dallie Piles, RPH   25 mg at 03/16/21 1208  . morphine 2 MG/ML injection 2 mg  2 mg Intravenous Q3H PRN Conrad [place], MD      . multivitamin with minerals tablet 1 tablet  1 tablet Oral Daily Lorella Nimrod, MD   1 tablet at 03/16/21 1208  . ondansetron (ZOFRAN) tablet 4 mg  4 mg Oral Q6H PRN Mansy, Jan A, MD       Or  . ondansetron [facility]) injection 4 mg  4 mg Intravenous Q6H PRN Mansy, Jan A, MD   4 mg at 03/13/21 0250  . oxyCODONE-acetaminophen (PERCOCET/ROXICET) 5-325 MG per tablet 1-2 tablet  1-2 tablet Oral Q6H PRN Conrad Jena, MD   2 tablet at 03/15/21 1741  . pantoprazole (PROTONIX) EC tablet 40 mg  40 mg Oral q morning Mansy, Jan A, MD   40 mg at 03/16/21 1222  . polyethylene glycol (MIRALAX / GLYCOLAX) packet 17 g  17 g Oral Daily Lorella Nimrod, MD   17 g at 03/17/21 0927  . tiotropium [facility]) inhalation capsule ([facility] use ONLY) 18 mcg  1 capsule Inhalation QPC lunch Mansy, Jan A, MD   18 mcg at 03/16/21 1211  . traZODone (DESYREL) tablet 50 mg  50 mg Oral QHS Loletha Grayer, MD          Discharge Medications: Please see discharge summary for a list of discharge medications.  Relevant Imaging Results:  Relevant Lab Results:   Additional Information SS#: [id number]. Not vaccinated.  Candie Chroman, LCSW

## 2021-03-17 NOTE — Progress Notes (Signed)
Patient refusing all medications this morning.  Patient was sleeping on assessment and when asked questions would not open eyes and would moan.  Charge RN called to room as patient appeared unresponsive.  Charge RN performed chest rub, and patient immediately responded by looking at and talking to [employer]. RN encouraged patient to take scheduled metoprolol.  Patient continued to refuse medications.

## 2021-03-17 NOTE — Progress Notes (Signed)
2100: IV team attempted to get access x3 unsuccessfully. Dr. Sidney Ace notified via secure chat and said it was fine to leave patient without fluids and order Pedialyte or Gatorade one at night and one in morning.   Around 0300 patient was trying to get out of bed, very confused, provider notified per secure chat and gave verbal order for haldol 1 mg IM injection. CCMD called to state patient's HR was sustaining in 160's probably due to patient trying to get up. It fluctuated from 90-160s and then stayed in 130's for a while. Provider notified and discontinued order for Haldol. Verbal order for Ativan 1 mg IM Q 6 hrs was given Patient tolerated well, patient's restlessness has decreased some. Dr.Mansy stated patient's need for IV access to be established. Nurse from ICU was called and was successful. IV secured with kerlix and coban. Restarted patient on NA 0.9% at 75 ml/hr per provider's order.  BP was checked and was 162/69, was given hydralazine 10 mg IV PRN at 0438 and came down to 158/51 at 0616.

## 2021-03-17 NOTE — Progress Notes (Signed)
[facility]  Daily Progress Note   Subjective  -   Patient sleeping.  Awakens somewhat to exam.  Arm is a little less swollen although bruising appears a little more diffuse today.  Objective Vitals:   03/17/21 0438 03/17/21 0616 03/17/21 0915 03/17/21 1134  BP: (!) 162/69 (!) 158/51 134/66 (!) 155/85  Pulse: 90 89 75 75  Resp: 18   20  Temp: 98.2 F (36.8 C)  98.6 F (37 C) 98 F (36.7 C)  TempSrc: Oral  Oral Oral  SpO2: 96%  100% 97%  Weight:      Height:        Intake/Output Summary (Last 24 hours) at 03/17/2021 1249 Last data filed at 03/17/2021 0200 Gross per 24 hour  Intake 240 ml  Output 1650 ml  Net -1410 ml    PULM  CTAB CV  RRR VASC  right arm with moderate swelling and significant bruising which is a little worse than it was yesterday although the swelling is a little better.  Laboratory CBC    Component Value Date/Time   WBC 8.9 03/16/2021 0447   HGB 8.4 (L) 03/16/2021 0447   HGB 11.5 (L) 11/05/2013 0437   HCT 24.3 (L) 03/16/2021 0447   HCT 34.5 (L) 11/05/2013 0437   PLT 129 (L) 03/16/2021 0447   PLT 186 11/05/2013 0437    BMET    Component Value Date/Time   NA 143 03/16/2021 0447   NA 141 11/07/2013 0535   K 4.1 03/16/2021 0447   K 4.0 11/07/2013 0535   CL 114 (H) 03/16/2021 0447   CL 111 (H) 11/07/2013 0535   CO2 23 03/16/2021 0447   CO2 25 11/07/2013 0535   GLUCOSE 89 03/16/2021 0447   GLUCOSE 82 11/07/2013 0535   BUN 53 (H) 03/16/2021 0447   BUN 44 (H) 11/07/2013 0535   CREATININE 3.12 (H) 03/16/2021 0447   CREATININE 1.81 (H) 11/07/2013 0535   CALCIUM 7.7 (L) 03/16/2021 0541   CALCIUM 7.9 (L) 03/16/2021 0447   GFRNONAA 14 (L) 03/16/2021 0447   GFRNONAA 26 (L) 11/07/2013 0535   GFRAA 23 (L) 01/09/2020 2054   GFRAA 30 (L) 11/07/2013 0535     Assessment/Planning: POD #3 s/p evacuation of hematoma, fistula ligation   Arm a little less swollen Bruising is still significant and will take weeks to  resolve. Elevate arm as tolerated. Okay to increase activity in that arm as tolerated    Leotis Pain  03/17/2021, 12:49 PM

## 2021-03-17 NOTE — Progress Notes (Signed)
Mobility Specialist - Progress Note   03/17/21 1400  Mobility  Activity Refused mobility  Mobility performed by Mobility specialist    Pt appears lethargic this date, contraindicating mobility. Pt very minimally verbally responsive, does not open eyes to initiate session. Will attempt session another date/time as appropriate.    Kathee Delton Mobility Specialist 03/17/21, 2:37 PM

## 2021-03-17 NOTE — Evaluation (Signed)
Physical Therapy Evaluation Patient Details Name: LEALAH URBANIAK MRN: [id number] DOB: Mar 02, 1930 Today's Date: 03/17/2021   History of Present Illness  LODEMA BENDER is a 87 y.o. Caucasian female with medical history significant for atrial fibrillation on Eliquis, stage IV chronic kidney disease, COPD, type 2 diabetes mellitus, emphysema, GERD, hypertension, osteoarthritis and hypothyroidism, who presented to the ER with acute onset of right upper extremity swelling and pain after lifting her dog and hearing something popping in her right arm.  This happened at the site of the right upper extremity fistula that was done in 2017 however the patient has not had any dialysis since then. Due to worsening edema and bruising she was taken to the OR by vascular surgery with resulted evacuation of 300 cc hematoma and ligation of AV fistula.  There was a rupture of AV fistula resulted in a large hematoma.    Clinical Impression  Patient lethargic, but able to wiggle toes, ankle pump, and momentarily open eyes with significant prompting. Per chart and OT note, pt lives alone in an apartment at baseline, [facility] for in home mobility and RW for community ambulation.  The patient was instructed a few supine exercises, AAROM for heel slides, but when asked if she wanted to do more exercises, shook her head no. Supine to sit attempted with totalAx2, poor sitting balance, progressed to minA with time. Pt began to lay back down, maxAx2 for supine. Overall the patient demonstrated deficits in function, mobility, and safety significantly different from PLOF and would benefit from skilled PT intervention to return to PLOF. Recommendation is SNF due to current level of assistance needed.    Follow Up Recommendations SNF    Equipment Recommendations  Other (comment) (TBD at next venue of care)    Recommendations for Other Services       Precautions / Restrictions Precautions Precautions: Fall;Other  (comment) Precaution Comments: RUE edematous, keep elevated Restrictions Weight Bearing Restrictions: No      Mobility  Bed Mobility Overal bed mobility: Needs Assistance Bed Mobility: Supine to Sit;Sit to Supine     Supine to sit: Total assist;+2 for physical assistance Sit to supine: Max assist;+2 for physical assistance        Transfers                 General transfer comment: pt declined, mildly agitated with PT and mobility attempts  Ambulation/Gait                Stairs            Wheelchair Mobility    Modified Rankin (Stroke Patients Only)       Balance Overall balance assessment: Needs assistance Sitting-balance support: Feet supported;Single extremity supported Sitting balance-Leahy Scale: Poor                                       Pertinent Vitals/Pain Pain Assessment: Faces Faces Pain Scale: Hurts whole lot Pain Location: RUE Pain Descriptors / Indicators: Aching;Moaning;Guarding;Grimacing Pain Intervention(s): Limited activity within patient's tolerance;Monitored during session;Repositioned    Home Living Family/patient expects to be discharged to:: Private residence Living Arrangements: Alone Available Help at Discharge: Family Type of Home: Apartment Home Access: Elevator     Home Layout: One level Home Equipment: Cane - single point;Bedside commode;Wheelchair - [employer] - 2 wheels      Prior Function Level of Independence: Needs assistance  Gait / Transfers Assistance Needed: Per pt, using SPC for in home mobility, uses RW for community mobility  ADL's / Homemaking Assistance Needed: Pt reports indep with basic ADL; daughter (who lives in same apartment complex) assists wtih med mgt, transportation  Comments: Per pt, 6 falls in 85mo    Hand Dominance        Extremity/Trunk Assessment   Upper Extremity Assessment Upper Extremity Assessment: Defer to OT evaluation    Lower Extremity  Assessment Lower Extremity Assessment: Generalized weakness    Cervical / Trunk Assessment Cervical / Trunk Assessment: Normal  Communication      Cognition Arousal/Alertness: Lethargic Behavior During Therapy:  (Pt irritated by PT in room) Overall Cognitive Status: Within Functional Limits for tasks assessed                                        General Comments General comments (skin integrity, edema, etc.): Pt resistant to RUE repositioning for elevated, but elevated with one pillow at end of session    Exercises General Exercises - Lower Extremity Ankle Circles/Pumps: AROM;Both;10 reps;AAROM Heel Slides: AAROM;Both;10 reps   Assessment/Plan    PT Assessment Patient needs continued PT services  PT Problem List Decreased strength;Decreased knowledge of use of DME;Decreased activity tolerance;Decreased range of motion;Decreased safety awareness;Pain;Decreased mobility;Decreased coordination       PT Treatment Interventions DME instruction;Balance training;Gait training;Neuromuscular re-education;Stair training;Functional mobility training;Patient/family education;Therapeutic activities;Therapeutic exercise    PT Goals (Current goals can be found in the Care Plan section)  Acute Rehab PT Goals PT Goal Formulation: Patient unable to participate in goal setting Time For Goal Achievement: 03/31/21 Potential to Achieve Goals: Good    Frequency Min 2X/week   Barriers to discharge        Co-evaluation               AM-PAC PT "6 Clicks" Mobility  Outcome Measure Help needed turning from your back to your side while in a flat bed without using bedrails?: Total Help needed moving from lying on your back to sitting on the side of a flat bed without using bedrails?: Total Help needed moving to and from a bed to a chair (including a wheelchair)?: Total Help needed standing up from a chair using your arms (e.g., wheelchair or bedside chair)?: Total Help  needed to walk in hospital room?: Total Help needed climbing 3-5 steps with a railing? : Total 6 Click Score: 6    End of Session   Activity Tolerance: Patient limited by lethargy Patient left: in bed;with call bell/phone within reach;with bed alarm set Nurse Communication: Mobility status PT Visit Diagnosis: Other abnormalities of gait and mobility (R26.89);Muscle weakness (generalized) (M62.81);Difficulty in walking, not elsewhere classified (R26.2);Pain Pain - Right/Left: Right Pain - part of body: Arm    Time: [id number] Time Calculation (min) (ACUTE ONLY): 16 min   Charges:   PT Evaluation $PT Eval Moderate Complexity: 1 Mod PT Treatments $Therapeutic Exercise: 8-22 mins       DLieutenant DiegoPT, DPT 12:26 PM,03/17/21

## 2021-03-17 NOTE — Progress Notes (Signed)
[facility]  ROUNDING NOTE   Subjective:   Denise [name] is a 87 y.o. female with past medical history of A-fib on Eliquis, COPD, GERD, diabetes type 2, hypertension and CKD stage 4. She is a current patient of [facility], but has not been to an appointment since July 2021. She presents to ED complaining of swelling and pain in right upper arm. She currently has a AVF at that site. She reported lifting her dog and hearing a pop.   Fistula was placed in 2017 presuming the patient would need dialysis in the near future. AVF has not been used as of date.   Patient seen laying in bed According to night RN note, patient was very confused, awake all night, trying to get out of bed. Required Ativan PRN.  Patient states she is tired this morning Denies pain, nausea and shortness of breah   Objective:  Vital signs in last 24 hours:  Temp:  [97.8 F (36.6 C)-98.6 F (37 C)] 98.6 F (37 C) (05/25 0915) Pulse Rate:  [59-90] 75 (05/25 0915) Resp:  [16-18] 18 (05/25 0438) BP: (123-169)/(51-99) 134/66 (05/25 0915) SpO2:  [95 %-100 %] 100 % (05/25 0915)  Weight change:  Filed Weights   03/11/21 2348  Weight: 77.1 kg    Intake/Output: I/O last 3 completed shifts: In: 1791.9 [P.O.:600; I.V.:891.9; Blood:300] Out: 2850 [Urine:2850]   Intake/Output this shift:  No intake/output data recorded.  Physical Exam: General: NAD, laying in bed  Head: Normocephalic, atraumatic. Moist oral mucosal membranes  Eyes: Anicteric  Lungs:  Clear to auscultation, normal breathing effort  Heart: Regular rate and rhythm  Abdomen:  Soft, nontender  Extremities:  no peripheral edema. Rt arm surgical dressing  Neurologic: Alert but drowsy, moving all four extremities  Skin: No lesions, Rt hand ecchymotic   Access: Rt AVF    Basic Metabolic Panel: Recent Labs  Lab 03/12/21 0608 03/13/21 0137 03/14/21 0251 03/15/21 0502 03/16/21 0447 03/16/21 0541 03/17/21 0501  NA 144 144 142 144 143  --    --   K 3.9 3.4* 4.7 4.7 4.1  --   --   CL 110 111 112* 115* 114*  --   --   CO2 '28 26 24 22 23  '$ --   --   GLUCOSE 123* 76 113* 141* 89  --   --   BUN 35* 32* 44* 52* 53*  --   --   CREATININE 2.53* 2.28* 2.88* 3.12* 3.12*  --   --   CALCIUM 8.7* 8.3* 8.0* 7.7* 7.9* 7.7*  --   MG  --  2.0  --   --   --   --  2.2    Liver Function Tests: Recent Labs  Lab 03/12/21 0037 03/16/21 0447  AST 21 23  ALT 11 9  ALKPHOS 111 76  BILITOT 0.8 1.1  PROT 6.1* 5.1*  ALBUMIN 3.3* 2.6*   No results for input(s): LIPASE, AMYLASE in the last 168 hours. No results for input(s): AMMONIA in the last 168 hours.  CBC: Recent Labs  Lab 03/13/21 0137 03/14/21 0251 03/14/21 0507 03/15/21 0502 03/16/21 0447  WBC 10.0 9.0 9.2 9.6 8.9  HGB 9.4* 7.1* 7.1* 7.5* 8.4*  HCT 29.1* 21.9* 22.1* 22.5* 24.3*  MCV 93.3 93.6 93.2 92.2 89.3  PLT 139* 123* 117* 116* 129*    Cardiac Enzymes: No results for input(s): CKTOTAL, CKMB, CKMBINDEX, TROPONINI in the last 168 hours.  BNP: Invalid input(s): POCBNP  CBG: Recent Labs  Lab 03/16/21 0738 03/16/21 1157 03/16/21 1633 03/16/21 2145 03/17/21 0745  GLUCAP 82 103* 90 134* 98    Microbiology: Results for orders placed or performed during the hospital encounter of 03/11/21  Resp Panel by RT-PCR (Flu A&B, Covid) Nasopharyngeal Swab     Status: None   Collection Time: 03/12/21 12:40 AM   Specimen: Nasopharyngeal Swab; Nasopharyngeal(NP) swabs in vial transport medium  Result Value Ref Range Status   SARS Coronavirus 2 by RT PCR NEGATIVE NEGATIVE Final    Comment: (NOTE) SARS-CoV-2 target nucleic acids are NOT DETECTED.  The SARS-CoV-2 RNA is generally detectable in upper respiratory specimens during the acute phase of infection. The lowest concentration of SARS-CoV-2 viral copies this assay can detect is 138 copies/mL. A negative result does not preclude SARS-Cov-2 infection and should not be used as the sole basis for treatment or other patient  management decisions. A negative result may occur with  improper specimen collection/handling, submission of specimen other than nasopharyngeal swab, presence of viral mutation(s) within the areas targeted by this assay, and inadequate number of viral copies(<138 copies/mL). A negative result must be combined with clinical observations, patient history, and epidemiological information. The expected result is Negative.  Fact Sheet for Patients:  EntrepreneurPulse.com.au  Fact Sheet for Healthcare Providers:  IncredibleEmployment.be  This test is no t yet approved or cleared by the [place] FDA and  has been authorized for detection and/or diagnosis of SARS-CoV-2 by FDA under an Emergency Use Authorization (EUA). This EUA will remain  in effect (meaning this test can be used) for the duration of the COVID-19 declaration under Section 564(b)(1) of the Act, 21 U.S.C.section 360bbb-3(b)(1), unless the authorization is terminated  or revoked sooner.       Influenza A by PCR NEGATIVE NEGATIVE Final   Influenza B by PCR NEGATIVE NEGATIVE Final    Comment: (NOTE) The Xpert Xpress SARS-CoV-2/FLU/RSV plus assay is intended as an aid in the diagnosis of influenza from Nasopharyngeal swab specimens and should not be used as a sole basis for treatment. Nasal washings and aspirates are unacceptable for Xpert Xpress SARS-CoV-2/FLU/RSV testing.  Fact Sheet for Patients: EntrepreneurPulse.com.au  Fact Sheet for Healthcare Providers: IncredibleEmployment.be  This test is not yet approved or cleared by the [place] FDA and has been authorized for detection and/or diagnosis of SARS-CoV-2 by FDA under an Emergency Use Authorization (EUA). This EUA will remain in effect (meaning this test can be used) for the duration of the COVID-19 declaration under Section 564(b)(1) of the Act, 21 U.S.C. section 360bbb-3(b)(1),  unless the authorization is terminated or revoked.  Performed at [facility], [address] [place], [place] [address]     Coagulation Studies: No results for input(s): LABPROT, INR in the last 72 hours.  Urinalysis: No results for input(s): COLORURINE, LABSPEC, PHURINE, GLUCOSEU, HGBUR, BILIRUBINUR, KETONESUR, PROTEINUR, UROBILINOGEN, NITRITE, LEUKOCYTESUR in the last 72 hours.  Invalid input(s): APPERANCEUR    Imaging: US RENAL  Result Date: 03/16/2021 CLINICAL DATA:  Acute renal insufficiency. EXAM: RENAL / URINARY TRACT ULTRASOUND COMPLETE COMPARISON:  CT 07/25/2015. FINDINGS: Right Kidney: Renal measurements: 8.9 x 3.4 x 3.4 cm = volume: 54.6 mL. Cortical thinning. Echogenicity within normal limits. 1.6 cm simple cyst. No hydronephrosis visualized. Tiny amount of right perirenal fluid cannot be excluded. Left Kidney: Renal measurements: 9.6 x 4.5 x 3.4 cm = volume: 75.7 mL. Cortical thinning. Echogenicity within normal limits. 1.1 cm simple cyst. No hydronephrosis visualized. Bladder: Appears normal for degree of bladder distention.  Other: None. IMPRESSION: 1. Bilateral renal cortical thinning. Tiny amount of right perirenal fluid cannot be excluded. No hydronephrosis or bladder distention. 2.  Bilateral simple renal cysts. Electronically Signed   By: Marcello Moores  Register   On: 03/16/2021 05:13     Medications:   . sodium chloride 75 mL/hr at 03/17/21 0500  . lactated ringers Stopped (03/14/21 2012)   . allopurinol  100 mg Oral QPM  . amoxicillin-clavulanate  1 tablet Oral Q12H  . ascorbic acid  250 mg Oral QPC lunch  . atorvastatin  40 mg Oral q morning  . feeding supplement  237 mL Oral BID BM  . fluticasone  2 spray Each Nare Daily  . furosemide  40 mg Oral q morning  . guaiFENesin  600 mg Oral BID  . insulin aspart  0-9 Units Subcutaneous TID AC & HS  . insulin glargine  25 Units Subcutaneous QPM  . irbesartan  75 mg Oral Daily  . levothyroxine  100 mcg Oral  Q0600  . metoprolol succinate  25 mg Oral Q breakfast  . multivitamin with minerals  1 tablet Oral Daily  . pantoprazole  40 mg Oral q morning  . polyethylene glycol  17 g Oral Daily  . tiotropium  1 capsule Inhalation QPC lunch   acetaminophen **OR** acetaminophen, haloperidol lactate, hydrALAZINE, LORazepam, magnesium hydroxide, morphine injection, ondansetron **OR** ondansetron (ZOFRAN) IV, oxyCODONE-acetaminophen, traZODone  Assessment/ Plan:  Ms. Denise [name] is a 87 y.o.  female with past medical history of A-fib on Eliquis, COPD, GERD, diabetes type 2, hypertension and CKD stage 4. She is a current patient of [facility], but has not been to an appointment since July 2021. She presents to ED complaining of swelling and pain in right upper arm.    1. Acute Kidney Injury on chronic kidney disease stage 4 with baseline creatinine 2.28 and GFR of 19 on 03/13/21.  Likely due to blood loss vs anesthesia effects Vascular surgery performed ligation and evacuation of hematoma in right arm on 03/14/21. Fistula now tied off. Currently prescribed irbesartan Renal ultrasound negative for hydronephrosis. Bilateral renal simple cysts No acute need for dialysis at this time UOP 1.6L Continue IVF  Will recheck creatinine with am labs Will require follow up with nephrology at discharge  Lab Results  Component Value Date   CREATININE 3.12 (H) 03/16/2021   CREATININE 3.12 (H) 03/15/2021   CREATININE 2.88 (H) 03/14/2021    Intake/Output Summary (Last 24 hours) at 03/17/2021 0953 Last data filed at 03/17/2021 0200 Gross per 24 hour  Intake 600 ml  Output 1650 ml  Net -1050 ml   2. Anemia of chronic kidney disease No recent outpatient labs available Lab Results  Component Value Date   HGB 8.4 (L) 03/16/2021   Received transfusion 03/15/21   3. Secondary Hyperparathyroidism:  Lab Results  Component Value Date   PTH 81 (H) 03/16/2021   PTH Comment 03/16/2021   CALCIUM 7.7 (L) 03/16/2021   Calcium not at target Will monitor with improved appetite  4. Diabetes mellitus type II with chronic kidney disease Recent Hgb A1c 7.0 on 03/12/21 Glucose stable    LOS: 4   5/25/20229:53 AM

## 2021-03-17 NOTE — TOC Progression Note (Signed)
Transition of Care [facility]) - Progression Note    Patient Details  Name: Denise [name] MRN: [id number] Date of Birth: 1929/12/11  Transition of Care [facility]) CM/SW [place], LCSW Phone Number: 03/17/2021, 2:36 PM  Clinical Narrative: Called patient's daughter to discuss PT recommendation. She is agreeable to SNF placement. No facility preference but does want her to stay close to [facility]. Patient is not vaccinated. Made her aware that patient would have to quarantine for 10-14 days but probably could have visitors.  Expected Discharge Plan: [place] Barriers to Discharge: Continued Medical Work up  Expected Discharge Plan and Services Expected Discharge Plan: [place] arrangements for the past 2 months: Apartment                                       Social Determinants of Health (SDOH) Interventions    Readmission Risk Interventions No flowsheet data found.

## 2021-03-17 NOTE — Progress Notes (Signed)
Patient ID: Denise [name], female   DOB: [birth year], 87 y.o.   MRN: [id number] [facility] PROGRESS NOTE  Denise [name] [id number] DOB: 1930-09-06 DOA: 03/11/2021 PCP: Perrin Maltese, MD  HPI/Subjective: Patient did not sleep much last night.  Was agitated last night with elevated heart rate.  Patient does not feel that well this morning.  Patient did not eat much this morning.  Nursing staff told me that she did not take her morning meds.  She does have some arm pain and swelling.  Objective: Vitals:   03/17/21 0915 03/17/21 1134  BP: 134/66 (!) 155/85  Pulse: 75 75  Resp:  20  Temp: 98.6 F (37 C) 98 F (36.7 C)  SpO2: 100% 97%    Intake/Output Summary (Last 24 hours) at 03/17/2021 1523 Last data filed at 03/17/2021 1412 Gross per 24 hour  Intake 480 ml  Output 2250 ml  Net -1770 ml   Filed Weights   03/11/21 2348  Weight: 77.1 kg    ROS: Review of Systems  Constitutional: Positive for malaise/fatigue.  Respiratory: Negative for shortness of breath.   Cardiovascular: Negative for chest pain.  Gastrointestinal: Negative for abdominal pain, nausea and vomiting.  Musculoskeletal: Positive for joint pain.   Exam: Physical Exam HENT:     Head: Normocephalic.     Mouth/Throat:     Pharynx: No oropharyngeal exudate.  Eyes:     General: Lids are normal.     Conjunctiva/sclera: Conjunctivae normal.  Cardiovascular:     Rate and Rhythm: Normal rate and regular rhythm.     Heart sounds: Normal heart sounds, S1 normal and S2 normal.  Pulmonary:     Breath sounds: No decreased breath sounds, wheezing, rhonchi or rales.  Abdominal:     Palpations: Abdomen is soft.     Tenderness: There is no abdominal tenderness.  Musculoskeletal:     Right lower leg: No swelling.     Left lower leg: No swelling.  Skin:    General: Skin is warm.     Findings: No rash.  Neurological:     Mental Status: She is alert and oriented to person, place, and time.       Data  Reviewed: Basic Metabolic Panel: Recent Labs  Lab 03/12/21 0608 03/13/21 0137 03/14/21 0251 03/15/21 0502 03/16/21 0447 03/16/21 0541 03/17/21 0501  NA 144 144 142 144 143  --   --   K 3.9 3.4* 4.7 4.7 4.1  --   --   CL 110 111 112* 115* 114*  --   --   CO2 '28 26 24 22 23  '$ --   --   GLUCOSE 123* 76 113* 141* 89  --   --   BUN 35* 32* 44* 52* 53*  --   --   CREATININE 2.53* 2.28* 2.88* 3.12* 3.12*  --   --   CALCIUM 8.7* 8.3* 8.0* 7.7* 7.9* 7.7*  --   MG  --  2.0  --   --   --   --  2.2   Liver Function Tests: Recent Labs  Lab 03/12/21 0037 03/16/21 0447  AST 21 23  ALT 11 9  ALKPHOS 111 76  BILITOT 0.8 1.1  PROT 6.1* 5.1*  ALBUMIN 3.3* 2.6*   CBC: Recent Labs  Lab 03/13/21 0137 03/14/21 0251 03/14/21 0507 03/15/21 0502 03/16/21 0447  WBC 10.0 9.0 9.2 9.6 8.9  HGB 9.4* 7.1* 7.1* 7.5* 8.4*  HCT 29.1* 21.9* 22.1* 22.5* 24.3*  MCV 93.3 93.6 93.2 92.2 89.3  PLT 139* 123* 117* 116* 129*    CBG: Recent Labs  Lab 03/16/21 1157 03/16/21 1633 03/16/21 2145 03/17/21 0745 03/17/21 1136  GLUCAP 103* 90 134* 98 94    Recent Results (from the past 240 hour(s))  Resp Panel by RT-PCR (Flu A&B, Covid) Nasopharyngeal Swab     Status: None   Collection Time: 03/12/21 12:40 AM   Specimen: Nasopharyngeal Swab; Nasopharyngeal(NP) swabs in vial transport medium  Result Value Ref Range Status   SARS Coronavirus 2 by RT PCR NEGATIVE NEGATIVE Final    Comment: (NOTE) SARS-CoV-2 target nucleic acids are NOT DETECTED.  The SARS-CoV-2 RNA is generally detectable in upper respiratory specimens during the acute phase of infection. The lowest concentration of SARS-CoV-2 viral copies this assay can detect is 138 copies/mL. A negative result does not preclude SARS-Cov-2 infection and should not be used as the sole basis for treatment or other patient management decisions. A negative result may occur with  improper specimen collection/handling, submission of specimen other than  nasopharyngeal swab, presence of viral mutation(s) within the areas targeted by this assay, and inadequate number of viral copies(<138 copies/mL). A negative result must be combined with clinical observations, patient history, and epidemiological information. The expected result is Negative.  Fact Sheet for Patients:  EntrepreneurPulse.com.au  Fact Sheet for Healthcare Providers:  IncredibleEmployment.be  This test is no t yet approved or cleared by the [place] FDA and  has been authorized for detection and/or diagnosis of SARS-CoV-2 by FDA under an Emergency Use Authorization (EUA). This EUA will remain  in effect (meaning this test can be used) for the duration of the COVID-19 declaration under Section 564(b)(1) of the Act, 21 U.S.C.section 360bbb-3(b)(1), unless the authorization is terminated  or revoked sooner.       Influenza A by PCR NEGATIVE NEGATIVE Final   Influenza B by PCR NEGATIVE NEGATIVE Final    Comment: (NOTE) The Xpert Xpress SARS-CoV-2/FLU/RSV plus assay is intended as an aid in the diagnosis of influenza from Nasopharyngeal swab specimens and should not be used as a sole basis for treatment. Nasal washings and aspirates are unacceptable for Xpert Xpress SARS-CoV-2/FLU/RSV testing.  Fact Sheet for Patients: EntrepreneurPulse.com.au  Fact Sheet for Healthcare Providers: IncredibleEmployment.be  This test is not yet approved or cleared by the [place] FDA and has been authorized for detection and/or diagnosis of SARS-CoV-2 by FDA under an Emergency Use Authorization (EUA). This EUA will remain in effect (meaning this test can be used) for the duration of the COVID-19 declaration under Section 564(b)(1) of the Act, 21 U.S.C. section 360bbb-3(b)(1), unless the authorization is terminated or revoked.  Performed at [facility], [address], [place], [place]  [address]      Studies: US RENAL  Result Date: 03/16/2021 CLINICAL DATA:  Acute renal insufficiency. EXAM: RENAL / URINARY TRACT ULTRASOUND COMPLETE COMPARISON:  CT 07/25/2015. FINDINGS: Right Kidney: Renal measurements: 8.9 x 3.4 x 3.4 cm = volume: 54.6 mL. Cortical thinning. Echogenicity within normal limits. 1.6 cm simple cyst. No hydronephrosis visualized. Tiny amount of right perirenal fluid cannot be excluded. Left Kidney: Renal measurements: 9.6 x 4.5 x 3.4 cm = volume: 75.7 mL. Cortical thinning. Echogenicity within normal limits. 1.1 cm simple cyst. No hydronephrosis visualized. Bladder: Appears normal for degree of bladder distention. Other: None. IMPRESSION: 1. Bilateral renal cortical thinning. Tiny amount of right perirenal fluid cannot be excluded. No hydronephrosis or bladder distention. 2.  Bilateral simple renal  cysts. Electronically Signed   By: Marcello Moores  Register   On: 03/16/2021 05:13    Scheduled Meds: . allopurinol  100 mg Oral QPM  . amoxicillin-clavulanate  1 tablet Oral Q12H  . ascorbic acid  250 mg Oral QPC lunch  . atorvastatin  40 mg Oral q morning  . feeding supplement  237 mL Oral BID BM  . fluticasone  2 spray Each Nare Daily  . furosemide  40 mg Oral q morning  . guaiFENesin  600 mg Oral BID  . insulin aspart  0-9 Units Subcutaneous TID AC & HS  . insulin glargine  10 Units Subcutaneous QPM  . irbesartan  75 mg Oral Daily  . levothyroxine  100 mcg Oral Q0600  . metoprolol succinate  25 mg Oral Q breakfast  . multivitamin with minerals  1 tablet Oral Daily  . pantoprazole  40 mg Oral q morning  . polyethylene glycol  17 g Oral Daily  . tiotropium  1 capsule Inhalation QPC lunch  . traZODone  50 mg Oral QHS   Continuous Infusions: . lactated ringers Stopped (03/14/21 2012)    Assessment/Plan:  1. Hematoma of right arm.  Taken to the OR for hematoma evacuation and ligation of the AV fistula on 03/15/2021. 2. Insomnia and agitation at night.  We will give  trazodone 50 mg nightly and see if that helps out with her sleep.  Underlying dementia. 3. Acute kidney injury on chronic kidney disease stage IV.  Creatinine stable.  Nephrology following and no indications for dialysis at this point. 4. Essential hypertension on irbesartan and metoprolol 5. Type 2 diabetes mellitus with hyperlipidemia unspecified on atorvastatin.  Decrease long-acting insulin with not eating very well.  Last hemoglobin A1c 7.0. 6. History of gout on allopurinol 7. History of COPD 8. Sinusitis on Augmentin 9. Paroxysmal atrial fibrillation.  Eliquis on hold with bruising and hematoma.  Can potentially restart in a week or so.  Continue metoprolol. 10. Weakness.  Daughter interested in rehab.        Code Status:     Code Status Orders  (From admission, onward)         Start     Ordered   03/12/21 0259  Full code  Continuous        03/12/21 0300        Code Status History    Date Active Date Inactive Code Status Order ID Comments User Context   11/29/2019 0842 11/30/2019 1808 Full Code [id number]  Ledora Bottcher, [place] Inpatient   11/28/2019 2109 11/29/2019 0729 Full Code [id number]  Awilda Bill, NP ED   07/31/2015 1615 08/03/2015 1811 Full Code [id number]  Corky Mull, MD Inpatient   07/29/2015 1417 07/31/2015 1615 Full Code [id number]  Epifanio Lesches, MD ED   Advance Care Planning Activity     Family Communication: Spoke with the patient's daughter on the phone Disposition Plan: Status is: Inpatient  Dispo: The patient is from: Home              Anticipated d/c is to: Rehab              Patient currently with insomnia last night and not sleeping very well we will try to get her sleeping to reset her time clock.   Difficult to place patient.  Hopefully not  Consultants: -Nephrology -Vascular surgery  Time spent: 28 minutes  [place]

## 2021-03-18 DIAGNOSIS — M2550 Pain in unspecified joint: Secondary | ICD-10-CM | POA: Diagnosis not present

## 2021-03-18 DIAGNOSIS — N184 Chronic kidney disease, stage 4 (severe): Secondary | ICD-10-CM | POA: Diagnosis not present

## 2021-03-18 DIAGNOSIS — K219 Gastro-esophageal reflux disease without esophagitis: Secondary | ICD-10-CM | POA: Diagnosis not present

## 2021-03-18 DIAGNOSIS — R6889 Other general symptoms and signs: Secondary | ICD-10-CM | POA: Diagnosis not present

## 2021-03-18 DIAGNOSIS — D631 Anemia in chronic kidney disease: Secondary | ICD-10-CM | POA: Diagnosis not present

## 2021-03-18 DIAGNOSIS — D649 Anemia, unspecified: Secondary | ICD-10-CM | POA: Diagnosis not present

## 2021-03-18 DIAGNOSIS — R531 Weakness: Secondary | ICD-10-CM | POA: Diagnosis not present

## 2021-03-18 DIAGNOSIS — S45991D Other specified injury of unspecified blood vessel at shoulder and upper arm level, right arm, subsequent encounter: Secondary | ICD-10-CM | POA: Diagnosis not present

## 2021-03-18 DIAGNOSIS — N390 Urinary tract infection, site not specified: Secondary | ICD-10-CM | POA: Diagnosis not present

## 2021-03-18 DIAGNOSIS — R2681 Unsteadiness on feet: Secondary | ICD-10-CM | POA: Diagnosis not present

## 2021-03-18 DIAGNOSIS — S40021D Contusion of right upper arm, subsequent encounter: Secondary | ICD-10-CM | POA: Diagnosis not present

## 2021-03-18 DIAGNOSIS — J449 Chronic obstructive pulmonary disease, unspecified: Secondary | ICD-10-CM | POA: Diagnosis not present

## 2021-03-18 DIAGNOSIS — M6281 Muscle weakness (generalized): Secondary | ICD-10-CM | POA: Diagnosis not present

## 2021-03-18 DIAGNOSIS — I10 Essential (primary) hypertension: Secondary | ICD-10-CM | POA: Diagnosis not present

## 2021-03-18 DIAGNOSIS — G4700 Insomnia, unspecified: Secondary | ICD-10-CM | POA: Diagnosis not present

## 2021-03-18 DIAGNOSIS — M1000 Idiopathic gout, unspecified site: Secondary | ICD-10-CM | POA: Diagnosis not present

## 2021-03-18 DIAGNOSIS — I480 Paroxysmal atrial fibrillation: Secondary | ICD-10-CM | POA: Diagnosis not present

## 2021-03-18 DIAGNOSIS — R52 Pain, unspecified: Secondary | ICD-10-CM | POA: Diagnosis not present

## 2021-03-18 DIAGNOSIS — F32A Depression, unspecified: Secondary | ICD-10-CM | POA: Diagnosis not present

## 2021-03-18 DIAGNOSIS — J019 Acute sinusitis, unspecified: Secondary | ICD-10-CM | POA: Diagnosis not present

## 2021-03-18 DIAGNOSIS — Z7401 Bed confinement status: Secondary | ICD-10-CM | POA: Diagnosis not present

## 2021-03-18 DIAGNOSIS — N2581 Secondary hyperparathyroidism of renal origin: Secondary | ICD-10-CM | POA: Diagnosis not present

## 2021-03-18 DIAGNOSIS — N179 Acute kidney failure, unspecified: Secondary | ICD-10-CM | POA: Diagnosis not present

## 2021-03-18 DIAGNOSIS — E1165 Type 2 diabetes mellitus with hyperglycemia: Secondary | ICD-10-CM | POA: Diagnosis not present

## 2021-03-18 DIAGNOSIS — N189 Chronic kidney disease, unspecified: Secondary | ICD-10-CM | POA: Diagnosis not present

## 2021-03-18 DIAGNOSIS — M109 Gout, unspecified: Secondary | ICD-10-CM | POA: Diagnosis not present

## 2021-03-18 DIAGNOSIS — E569 Vitamin deficiency, unspecified: Secondary | ICD-10-CM | POA: Diagnosis not present

## 2021-03-18 DIAGNOSIS — R404 Transient alteration of awareness: Secondary | ICD-10-CM | POA: Diagnosis not present

## 2021-03-18 DIAGNOSIS — Z743 Need for continuous supervision: Secondary | ICD-10-CM | POA: Diagnosis not present

## 2021-03-18 DIAGNOSIS — E119 Type 2 diabetes mellitus without complications: Secondary | ICD-10-CM | POA: Diagnosis not present

## 2021-03-18 DIAGNOSIS — H04123 Dry eye syndrome of bilateral lacrimal glands: Secondary | ICD-10-CM | POA: Diagnosis not present

## 2021-03-18 DIAGNOSIS — Z736 Limitation of activities due to disability: Secondary | ICD-10-CM | POA: Diagnosis not present

## 2021-03-18 DIAGNOSIS — E785 Hyperlipidemia, unspecified: Secondary | ICD-10-CM | POA: Diagnosis not present

## 2021-03-18 DIAGNOSIS — E039 Hypothyroidism, unspecified: Secondary | ICD-10-CM | POA: Diagnosis not present

## 2021-03-18 DIAGNOSIS — E1169 Type 2 diabetes mellitus with other specified complication: Secondary | ICD-10-CM | POA: Diagnosis not present

## 2021-03-18 DIAGNOSIS — T148XXA Other injury of unspecified body region, initial encounter: Secondary | ICD-10-CM | POA: Diagnosis not present

## 2021-03-18 DIAGNOSIS — R451 Restlessness and agitation: Secondary | ICD-10-CM | POA: Diagnosis not present

## 2021-03-18 LAB — RESP PANEL BY RT-PCR (FLU A&B, COVID) ARPGX2
Influenza A by PCR: NEGATIVE
Influenza B by PCR: NEGATIVE
SARS Coronavirus 2 by RT PCR: NEGATIVE

## 2021-03-18 LAB — CBC
HCT: 25.3 % — ABNORMAL LOW (ref 36.0–46.0)
Hemoglobin: 8.5 g/dL — ABNORMAL LOW (ref 12.0–15.0)
MCH: 30.2 pg (ref 26.0–34.0)
MCHC: 33.6 g/dL (ref 30.0–36.0)
MCV: 90 fL (ref 80.0–100.0)
Platelets: 182 10*3/uL (ref 150–400)
RBC: 2.81 MIL/uL — ABNORMAL LOW (ref 3.87–5.11)
RDW: 14.6 % (ref 11.5–15.5)
WBC: 7.7 10*3/uL (ref 4.0–10.5)
nRBC: 0 % (ref 0.0–0.2)

## 2021-03-18 LAB — GLUCOSE, CAPILLARY
Glucose-Capillary: 90 mg/dL (ref 70–99)
Glucose-Capillary: 190 mg/dL — ABNORMAL HIGH (ref 70–99)

## 2021-03-18 LAB — BASIC METABOLIC PANEL
Anion gap: 7 (ref 5–15)
BUN: 47 mg/dL — ABNORMAL HIGH (ref 8–23)
CO2: 23 mmol/L (ref 22–32)
Calcium: 8.1 mg/dL — ABNORMAL LOW (ref 8.9–10.3)
Chloride: 116 mmol/L — ABNORMAL HIGH (ref 98–111)
Creatinine, Ser: 2.12 mg/dL — ABNORMAL HIGH (ref 0.44–1.00)
GFR, Estimated: 22 mL/min — ABNORMAL LOW (ref >60)
Glucose, Bld: 100 mg/dL — ABNORMAL HIGH (ref 70–99)
Potassium: 4.1 mmol/L (ref 3.5–5.1)
Sodium: 146 mmol/L — ABNORMAL HIGH (ref 135–145)

## 2021-03-18 MED ORDER — ADULT MULTIVITAMIN W/MINERALS CH: 1.0000 | ORAL_TABLET | Freq: Every day | ORAL | 0 refills | Status: DC

## 2021-03-18 MED ORDER — POLYETHYLENE GLYCOL 3350 17 G PO PACK: 17.0000 g | PACK | Freq: Every day | ORAL | 0 refills | Status: DC

## 2021-03-18 MED ORDER — BISACODYL 10 MG RE SUPP
10.0000 mg | Freq: Once | RECTAL | Status: AC
  Administered 2021-03-18: 10 mg via RECTAL
  Filled 2021-03-18: qty 1

## 2021-03-18 MED ORDER — METOPROLOL SUCCINATE ER 25 MG PO TB24: 25.0000 mg | ORAL_TABLET | Freq: Every day | ORAL | 0 refills | Status: DC

## 2021-03-18 MED ORDER — FLUTICASONE PROPIONATE 50 MCG/ACT NA SUSP: 2.0000 | Freq: Every day | NASAL | 0 refills | Status: DC

## 2021-03-18 MED ORDER — ENSURE ENLIVE PO LIQD: 237.0000 mL | Freq: Two times a day (BID) | ORAL | 0 refills | Status: DC

## 2021-03-18 MED ORDER — OXYCODONE-ACETAMINOPHEN 5-325 MG PO TABS: 1.0000 | ORAL_TABLET | Freq: Four times a day (QID) | ORAL | 0 refills | Status: AC | PRN

## 2021-03-18 MED ORDER — INSULIN ASPART 100 UNIT/ML IJ SOLN: INTRAMUSCULAR | 11 refills | Status: DC

## 2021-03-18 MED ORDER — TRAZODONE HCL 50 MG PO TABS: 50.0000 mg | ORAL_TABLET | Freq: Every day | ORAL | 0 refills | Status: DC

## 2021-03-18 NOTE — TOC Progression Note (Signed)
Transition of Care [facility]) - Progression Note    Patient Details  Name: Denise [name] MRN: [id number] Date of Birth: 1930/10/07  [facility]) CM/SW Contact  Candie Chroman, LCSW Phone Number: 03/18/2021, 11:33 AM  Clinical Narrative: Called daughter and provided bed offers: [facility] and [facility]. Daughter prefers [facility]. Admissions coordinator said they will have a bed today if we get auth. Will start today and asked MD to order a rapid COVID test.    Expected Discharge Plan: [place] Barriers to Discharge: Continued Medical Work up  Expected Discharge Plan and Services Expected Discharge Plan: [place] arrangements for the past 2 months: Apartment                                       Social Determinants of Health (SDOH) Interventions    Readmission Risk Interventions No flowsheet data found.

## 2021-03-18 NOTE — Progress Notes (Signed)
Pt discharged per MD order. IV removed. Right arm wrapped with kerlix and ace wrap. Report called to [facility].

## 2021-03-18 NOTE — Progress Notes (Signed)
[facility]  Daily Progress Note   Subjective  -   Still has a lot of pain in the arm.  The arm is less swollen and it is in an Ace wrap currently.  Bruising is significant.  Objective Vitals:   03/17/21 1939 03/18/21 0453 03/18/21 0824 03/18/21 1100  BP: (!) 154/66 (!) 151/59 (!) 152/80 (!) 152/54  Pulse: 74 78 79 84  Resp: '18 17 18 20  '$ Temp: 98.9 F (37.2 C) 99.1 F (37.3 C) 97.7 F (36.5 C) 98.1 F (36.7 C)  TempSrc: Oral Oral Oral Oral  SpO2: 100% 98% 99% 100%  Weight:      Height:        Intake/Output Summary (Last 24 hours) at 03/18/2021 1550 Last data filed at 03/18/2021 1303 Gross per 24 hour  Intake 120 ml  Output 350 ml  Net -230 ml    PULM  CTAB CV  RRR VASC  right arm is in an Ace wrap and the swelling is down.  Laboratory CBC    Component Value Date/Time   WBC 7.7 03/18/2021 0432   HGB 8.5 (L) 03/18/2021 0432   HGB 11.5 (L) 11/05/2013 0437   HCT 25.3 (L) 03/18/2021 0432   HCT 34.5 (L) 11/05/2013 0437   PLT 182 03/18/2021 0432   PLT 186 11/05/2013 0437    BMET    Component Value Date/Time   NA 146 (H) 03/18/2021 0432   NA 141 11/07/2013 0535   K 4.1 03/18/2021 0432   K 4.0 11/07/2013 0535   CL 116 (H) 03/18/2021 0432   CL 111 (H) 11/07/2013 0535   CO2 23 03/18/2021 0432   CO2 25 11/07/2013 0535   GLUCOSE 100 (H) 03/18/2021 0432   GLUCOSE 82 11/07/2013 0535   BUN 47 (H) 03/18/2021 0432   BUN 44 (H) 11/07/2013 0535   CREATININE 2.12 (H) 03/18/2021 0432   CREATININE 1.81 (H) 11/07/2013 0535   CALCIUM 8.1 (L) 03/18/2021 0432   CALCIUM 7.7 (L) 03/16/2021 0541   GFRNONAA 22 (L) 03/18/2021 0432   GFRNONAA 26 (L) 11/07/2013 0535   GFRAA 23 (L) 01/09/2020 2054   GFRAA 30 (L) 11/07/2013 0535    Assessment/Planning: POD #4 s/p evacuation of right arm hematoma and ligation of fistula   Arm will be swollen and bruised for several weeks.  Okay to mobilize and use the arm as tolerated.  Okay for discharge from vascular  point of view.  Would follow-up in the office in 2 to 3 weeks to check her arm.    Leotis Pain  03/18/2021, 3:50 PM

## 2021-03-18 NOTE — Care Management Important Message (Signed)
Important Message  Patient Details  Name: Denise [name] MRN: [id number] Date of Birth: 05/04/1930   Medicare Important Message Given:  Yes     Dannette Barbara 03/18/2021, 12:54 PM

## 2021-03-18 NOTE — Discharge Summary (Signed)
[facility] NAME: Gaudalupe Coyle    MR#:  [id number]  DATE OF BIRTH:  02/23/1930  DATE OF ADMISSION:  03/11/2021 ADMITTING PHYSICIAN: Christel Mormon, MD  DATE OF DISCHARGE: 03/18/2021  PRIMARY CARE PHYSICIAN: Perrin Maltese, MD    ADMISSION DIAGNOSIS:  Hematoma [T14.8XXA] Intramural hematoma of artery of right upper extremity [S45.991A]  DISCHARGE DIAGNOSIS:  Active Problems:   Type 2 diabetes mellitus with hyperlipidemia (HCC)   Intramural hematoma of artery of right upper extremity   Hematoma   Left arm swelling   Insomnia   Acute kidney injury superimposed on CKD (HCC)   Acute sinusitis   Weakness   SECONDARY DIAGNOSIS:   Past Medical History:  Diagnosis Date  . A-fib ([place])   . Arthritis   . Cataract   . Chronic kidney disease   . COPD (chronic obstructive pulmonary disease) ([place])   . Diabetes mellitus without complication ([place])   . Emphysema of lung ([place])   . GERD (gastroesophageal reflux disease)   . Hypertension   . Hypothyroidism     HOSPITAL COURSE:   1. Hematoma right arm.  The patient was taken to the OR for hematoma evacuation and ligation of the AV fistula on 03/15/2021.  We will follow-up with Dr. Lucky Cowboy as outpatient. 2.  Insomnia and agitation at night.  Patient slept well with trazodone 50 mg last night and will keep that going.  Even at home patient is flipped her days and nights.  Hopefully the trazodone will keep things where she sleeping at night and not during the day.  Patient has underlying dementia. 3.  Acute kidney injury on chronic kidney disease stage IV.  Creatinine actually improved today down to 2.12 with a GFR of 22.  Creatinine peaked at 3.12. 4.  Essential hypertension on Diovan and metoprolol Type 2 diabetes mellitus with hyperlipidemia unspecified on atorvastatin.  I held her long-acting insulin because she was not eating very well.  Last hemoglobin A1c 7.0.  Will give short acting  insulin only 3 units with meals if she eats.  If sugars start trending up into the 250s can consider low-dose Lantus insulin may be 6 units at night. 5.  History of gout on allopurinol 6.  History of COPD 7.  Sinusitis.  Completed Augmentin. 8.  Paroxysmal atrial fibrillation.  Eliquis on hold with bruising and hematoma and anemia.  Potentially can restart in a few weeks.  Continue metoprolol. 9.  Weakness.  Daughter interested in rehab. 10.  Loss anemia.  Patient was transfused 1 unit of packed red blood cells.  Hemoglobin last upon discharge was 8.5 11.  Consider palliative care team follow-up as outpatient.  DISCHARGE CONDITIONS:   Fair  CONSULTS OBTAINED:  Treatment Team:  Algernon Huxley, MD  DRUG ALLERGIES:   Allergies  Allergen Reactions  . Iodinated Diagnostic Agents Anaphylaxis, Other (See Comments) and Rash  . Benzocaine-Chloroxylenol-Hc     Years ago  . Benzonatate Rash    Note: pruritis    DISCHARGE MEDICATIONS:   Allergies as of 03/18/2021      Reactions   Iodinated Diagnostic Agents Anaphylaxis, Other (See Comments), Rash   Benzocaine-chloroxylenol-hc    Years ago   Benzonatate Rash   Note: pruritis      Medication List    STOP taking these medications   Eliquis 2.5 MG Tabs tablet Generic drug: apixaban   hydrocortisone cream 1 %   metolazone 5 MG tablet  Commonly known as: ZAROXOLYN   metoprolol tartrate 25 MG tablet Commonly known as: Yetta Flock FlexTouch 100 UNIT/ML FlexTouch Pen Generic drug: insulin degludec     TAKE these medications   acetaminophen 325 MG tablet Commonly known as: TYLENOL Take 1-2 tablets (325-650 mg total) by mouth every 4 (four) hours as needed for mild pain (temp > 101.5).   allopurinol 100 MG tablet Commonly known as: ZYLOPRIM Take 100 mg by mouth every evening.   ascorbic acid 250 MG tablet Commonly known as: VITAMIN C Take 250 mg by mouth daily after lunch.   atorvastatin 40 MG tablet Commonly known  as: LIPITOR Take 40 mg by mouth every morning.   feeding supplement Liqd Take 237 mLs by mouth 2 (two) times daily between meals. Start taking on: Mar 19, 2021   fluticasone 50 MCG/ACT nasal spray Commonly known as: FLONASE Place 2 sprays into both nostrils daily. Start taking on: Mar 19, 2021   furosemide 40 MG tablet Commonly known as: LASIX Take 40 mg by mouth every morning.   Fusion Plus Caps Take 1 capsule by mouth daily.   levothyroxine 100 MCG tablet Commonly known as: SYNTHROID Take 100 mcg by mouth daily before breakfast.   metoprolol succinate 25 MG 24 hr tablet Commonly known as: TOPROL-XL Take 1 tablet (25 mg total) by mouth daily with breakfast. Start taking on: Mar 19, 2021 What changed:   medication strength  how much to take  when to take this  additional instructions   multivitamin with minerals Tabs tablet Take 1 tablet by mouth daily. Start taking on: Mar 19, 2021   nystatin cream Commonly known as: MYCOSTATIN Apply 1 application topically 2 (two) times daily. MIX WITH HYDROCORTISONE   oxyCODONE-acetaminophen 5-325 MG tablet Commonly known as: PERCOCET/ROXICET Take 1 tablet by mouth every 6 (six) hours as needed for up to 4 days for severe pain.   pantoprazole 40 MG tablet Commonly known as: PROTONIX Take 40 mg by mouth every morning.   polyethylene glycol 17 g packet Commonly known as: MIRALAX / GLYCOLAX Take 17 g by mouth daily. Start taking on: Mar 19, 2021   Spiriva HandiHaler 18 MCG inhalation capsule Generic drug: tiotropium Place 1 capsule into inhaler and inhale daily after lunch.   Systane Nighttime Oint Place 1 application into both eyes at bedtime as needed for dry eyes.   traZODone 50 MG tablet Commonly known as: DESYREL Take 1 tablet (50 mg total) by mouth at bedtime.   valsartan 80 MG tablet Commonly known as: DIOVAN Take 80 mg by mouth every morning.        DISCHARGE INSTRUCTIONS:   Follow-up team at  rehab 1 day Follow-up Dr. Lucky Cowboy few weeks  If you experience worsening of your admission symptoms, develop shortness of breath, life threatening emergency, suicidal or homicidal thoughts you must seek medical attention immediately by calling 911 or calling your MD immediately  if symptoms less severe.  You Must read complete instructions/literature along with all the possible adverse reactions/side effects for all the Medicines you take and that have been prescribed to you. Take any new Medicines after you have completely understood and accept all the possible adverse reactions/side effects.   Please note  You were cared for by a hospitalist during your hospital stay. If you have any questions about your discharge medications or the care you received while you were in the hospital after you are discharged, you can call the unit and asked to speak  with the hospitalist on call if the hospitalist that took care of you is not available. Once you are discharged, your primary care physician will handle any further medical issues. Please note that NO REFILLS for any discharge medications will be authorized once you are discharged, as it is imperative that you return to your primary care physician (or establish a relationship with a primary care physician if you do not have one) for your aftercare needs so that they can reassess your need for medications and monitor your lab values.    Today   CHIEF COMPLAINT:   Chief Complaint  Patient presents with  . Arm Pain    HISTORY OF PRESENT ILLNESS:  Jeanea Barca  is a 87 y.o. female admitted with arm pain   VITAL SIGNS:  Blood pressure (!) 152/54, pulse 84, temperature 98.1 F (36.7 C), temperature source Oral, resp. rate 20, height '5\' 6"'$  (1.676 m), weight 77.1 kg, SpO2 100 %.  I/O:    Intake/Output Summary (Last 24 hours) at 03/18/2021 1531 Last data filed at 03/18/2021 1303 Gross per 24 hour  Intake 120 ml  Output 350 ml  Net -230 ml     PHYSICAL EXAMINATION:  GENERAL:  87 y.o.-year-old patient lying in the bed with no acute distress.  EYES: Pupils equal, round, reactive to light and accommodation. No scleral icterus. Extraocular muscles intact.  HEENT: Head atraumatic, normocephalic. Oropharynx and nasopharynx clear.  LUNGS: Normal breath sounds bilaterally, no wheezing, rales,rhonchi or crepitation. No use of accessory muscles of respiration.  CARDIOVASCULAR: S1, S2 normal. No murmurs, rubs, or gallops.  ABDOMEN: Soft, non-tender, non-distended.  EXTREMITIES: Right arm swelling NEUROLOGIC: Cranial nerves II through XII are intact. Muscle strength 5/5 in all extremities. Sensation intact. Gait not checked.  PSYCHIATRIC: The patient is alert and oriented x 3.  SKIN: Right arm bruising  DATA REVIEW:   CBC Recent Labs  Lab 03/18/21 0432  WBC 7.7  HGB 8.5*  HCT 25.3*  PLT 182    Chemistries  Recent Labs  Lab 03/16/21 0447 03/16/21 0541 03/17/21 0501 03/18/21 0432  NA 143  --   --  146*  K 4.1  --   --  4.1  CL 114*  --   --  116*  CO2 23  --   --  23  GLUCOSE 89  --   --  100*  BUN 53*  --   --  47*  CREATININE 3.12*  --   --  2.12*  CALCIUM 7.9*   < >  --  8.1*  MG  --   --  2.2  --   AST 23  --   --   --   ALT 9  --   --   --   ALKPHOS 76  --   --   --   BILITOT 1.1  --   --   --    < > = values in this interval not displayed.    Microbiology Results  Results for orders placed or performed during the hospital encounter of 03/11/21  Resp Panel by RT-PCR (Flu A&B, Covid) Nasopharyngeal Swab     Status: None   Collection Time: 03/12/21 12:40 AM   Specimen: Nasopharyngeal Swab; Nasopharyngeal(NP) swabs in vial transport medium  Result Value Ref Range Status   SARS Coronavirus 2 by RT PCR NEGATIVE NEGATIVE Final    Comment: (NOTE) SARS-CoV-2 target nucleic acids are NOT DETECTED.  The SARS-CoV-2 RNA is generally detectable in upper respiratory  specimens during the acute phase of infection.  The lowest concentration of SARS-CoV-2 viral copies this assay can detect is 138 copies/mL. A negative result does not preclude SARS-Cov-2 infection and should not be used as the sole basis for treatment or other patient management decisions. A negative result may occur with  improper specimen collection/handling, submission of specimen other than nasopharyngeal swab, presence of viral mutation(s) within the areas targeted by this assay, and inadequate number of viral copies(<138 copies/mL). A negative result must be combined with clinical observations, patient history, and epidemiological information. The expected result is Negative.  Fact Sheet for Patients:  EntrepreneurPulse.com.au  Fact Sheet for Healthcare Providers:  IncredibleEmployment.be  This test is no t yet approved or cleared by the [place] FDA and  has been authorized for detection and/or diagnosis of SARS-CoV-2 by FDA under an Emergency Use Authorization (EUA). This EUA will remain  in effect (meaning this test can be used) for the duration of the COVID-19 declaration under Section 564(b)(1) of the Act, 21 U.S.C.section 360bbb-3(b)(1), unless the authorization is terminated  or revoked sooner.       Influenza A by PCR NEGATIVE NEGATIVE Final   Influenza B by PCR NEGATIVE NEGATIVE Final    Comment: (NOTE) The Xpert Xpress SARS-CoV-2/FLU/RSV plus assay is intended as an aid in the diagnosis of influenza from Nasopharyngeal swab specimens and should not be used as a sole basis for treatment. Nasal washings and aspirates are unacceptable for Xpert Xpress SARS-CoV-2/FLU/RSV testing.  Fact Sheet for Patients: EntrepreneurPulse.com.au  Fact Sheet for Healthcare Providers: IncredibleEmployment.be  This test is not yet approved or cleared by the [place] FDA and has been authorized for detection and/or diagnosis of SARS-CoV-2 by FDA  under an Emergency Use Authorization (EUA). This EUA will remain in effect (meaning this test can be used) for the duration of the COVID-19 declaration under Section 564(b)(1) of the Act, 21 U.S.C. section 360bbb-3(b)(1), unless the authorization is terminated or revoked.  Performed at [facility], [address] [place], [place] [address]   Resp Panel by RT-PCR (Flu A&B, Covid) Nasopharyngeal Swab     Status: None   Collection Time: 03/18/21  2:34 PM   Specimen: Nasopharyngeal Swab; Nasopharyngeal(NP) swabs in vial transport medium  Result Value Ref Range Status   SARS Coronavirus 2 by RT PCR NEGATIVE NEGATIVE Final    Comment: (NOTE) SARS-CoV-2 target nucleic acids are NOT DETECTED.  The SARS-CoV-2 RNA is generally detectable in upper respiratory specimens during the acute phase of infection. The lowest concentration of SARS-CoV-2 viral copies this assay can detect is 138 copies/mL. A negative result does not preclude SARS-Cov-2 infection and should not be used as the sole basis for treatment or other patient management decisions. A negative result may occur with  improper specimen collection/handling, submission of specimen other than nasopharyngeal swab, presence of viral mutation(s) within the areas targeted by this assay, and inadequate number of viral copies(<138 copies/mL). A negative result must be combined with clinical observations, patient history, and epidemiological information. The expected result is Negative.  Fact Sheet for Patients:  EntrepreneurPulse.com.au  Fact Sheet for Healthcare Providers:  IncredibleEmployment.be  This test is no t yet approved or cleared by the [place] FDA and  has been authorized for detection and/or diagnosis of SARS-CoV-2 by FDA under an Emergency Use Authorization (EUA). This EUA will remain  in effect (meaning this test can be used) for the duration of the COVID-19 declaration  under Section 564(b)(1) of the  Act, 21 U.S.C.section 360bbb-3(b)(1), unless the authorization is terminated  or revoked sooner.       Influenza A by PCR NEGATIVE NEGATIVE Final   Influenza B by PCR NEGATIVE NEGATIVE Final    Comment: (NOTE) The Xpert Xpress SARS-CoV-2/FLU/RSV plus assay is intended as an aid in the diagnosis of influenza from Nasopharyngeal swab specimens and should not be used as a sole basis for treatment. Nasal washings and aspirates are unacceptable for Xpert Xpress SARS-CoV-2/FLU/RSV testing.  Fact Sheet for Patients: EntrepreneurPulse.com.au  Fact Sheet for Healthcare Providers: IncredibleEmployment.be  This test is not yet approved or cleared by the [place] FDA and has been authorized for detection and/or diagnosis of SARS-CoV-2 by FDA under an Emergency Use Authorization (EUA). This EUA will remain in effect (meaning this test can be used) for the duration of the COVID-19 declaration under Section 564(b)(1) of the Act, 21 U.S.C. section 360bbb-3(b)(1), unless the authorization is terminated or revoked.  Performed at [facility], [address] [place], [place] [address]       Management plans discussed with the patient, family and they are in agreement.  CODE STATUS:     Code Status Orders  (From admission, onward)         Start     Ordered   03/12/21 0259  Full code  Continuous        03/12/21 0300        Code Status History    Date Active Date Inactive Code Status Order ID Comments User Context   11/29/2019 0842 11/30/2019 1808 Full Code [id number]  Ledora Bottcher, [place] [facility]   11/28/2019 2109 11/29/2019 0729 Full Code [id number]  Awilda Bill, NP ED   07/31/2015 1615 08/03/2015 1811 Full Code [id number]  Corky Mull, MD Inpatient   07/29/2015 1417 07/31/2015 1615 Full Code [id number]  Epifanio Lesches, MD ED   Advance Care Planning Activity      TOTAL TIME TAKING CARE OF  THIS PATIENT: 35 minutes.    Loletha Grayer M.D on 03/18/2021 at 3:31 PM  Between 7am to 6pm - Pager - [phone]  After 6pm go to [email or web address] - password [email or web address]  [facility]  CC: Primary care physician; Perrin Maltese, MD

## 2021-03-18 NOTE — Progress Notes (Signed)
Nutrition Follow Up Note    DOCUMENTATION CODES:   Not applicable  INTERVENTION:   Ensure Enlive po BID, each supplement provides 350 kcal and 20 grams of protein  Add Magic cup TID with meals, each supplement provides 290 kcal and 9 grams of protein  MVI po daily   NUTRITION DIAGNOSIS:   Increased nutrient needs related to chronic illness (COPD) as evidenced by estimated needs.  GOAL:   Patient will meet greater than or equal to 90% of their needs  -progressing   MONITOR:   PO intake,Supplement acceptance,Labs,Weight trends,Skin,I & O's  ASSESSMENT:   87 y.o. caucasian female with medical history significant for atrial fibrillation on Eliquis, stage IV chronic kidney disease, COPD, type 2 diabetes mellitus, emphysema, GERD, hypertension, osteoarthritis, dementia and hypothyroidism who presented to the ER with hematoma at fistula site.   Pt with some improvement in oral intake; pt documented to be eating anywhere from sips/bites to 100% of meals. Pt is also frequently refusing meals. Spoke with RN, pt is drinking her Ensure supplements. RD will add Magic Cups to meal trays. Pt pending SNF placement. No new weight since admit; will request weekly weights.   No BM noted since 5/19; bowel regimen per MD  Medications reviewed and include: allopurionl, vitamin C, dulcolax, lasix, insulin, synthroid, MVI, protonix, miralax  Labs reviewed: Na 146(H), K 4.1 wnl, BUN 47(H), creat 2.12(H) Mg 2.2 wnl- 5/25 Hgb 8.5(L), Hct 25.3(L) cbgs- 90, 190 x 24 hrs  Diet Order:    Diet Order            Diet Carb Modified Fluid consistency: Thin; Room service appropriate? Yes  Diet effective now                EDUCATION NEEDS:   Education needs have been addressed  Skin:  Skin Assessment: Reviewed RN Assessment (ecchymosis)  Last BM:  5/19  Height:   Ht Readings from Last 1 Encounters:  03/11/21 '5\' 6"'$  (1.676 m)    Weight:   Wt Readings from Last 1 Encounters:  03/11/21  77.1 kg    Ideal Body Weight:  59 kg  BMI:  Body mass index is 27.44 kg/m.  Estimated Nutritional Needs:   Kcal:  1600-1800kcal/day  Protein:  80-90g/day  Fluid:  1.5-1.8L/day  Koleen Distance MS, RD, LDN Please refer to [facility] for RD and/or RD on-call/weekend/after hours pager

## 2021-03-18 NOTE — Progress Notes (Deleted)
Mobility Specialist - Progress Note   03/18/21 1200  Mobility  Activity Ambulated in room;Ambulated to bathroom  Level of Assistance Modified independent, requires aide device or extra time  Assistive Device None  Distance Ambulated (ft) 150 ft  Mobility Ambulated independently in room  Mobility Response Tolerated well  Mobility performed by Mobility specialist  $Mobility charge 1 Mobility    Pre-mobility: 64 HR, 92% SpO2 During mobility: 78 HR, 86% SpO2 Post-mobility: 68 HR, 95% SpO2   Pt ambulated in room without AD. No LOB. Denied dizziness. Pt unable to determine if SOB during activity, states "I can't really tell honestly". O2 monitored, ranging 86-88% on 2L. Reports feeling good during ambulation. Urinal output prior to return to bed, independent in peri-care. Pt returned to bed, lunch tray arrived at the end of session and was placed in front of pt.    Kathee Delton Mobility Specialist 03/18/21, 12:24 PM

## 2021-03-18 NOTE — TOC Transition Note (Signed)
Transition of Care [facility]) - CM/SW Discharge Note   Patient Details  Name: Denise [name] MRN: [id number] Date of Birth: 29-May-1930  [facility]) CM/SW Contact:  Candie Chroman, LCSW Phone Number: 03/18/2021, 3:52 PM   Clinical Narrative:  Patient has orders to discharge to [facility] today. Auth [id number]. RN will call report to [phone] (Room 712). EMS is on their way. No further concerns. CSW signing off.   Final next level of care: Skilled Nursing Facility Barriers to Discharge: Barriers Resolved   Patient Goals and CMS Choice Patient states their goals for this hospitalization and ongoing recovery are:: to return home CMS Medicare.gov Compare Post Acute Care list provided to:: Patient Choice offered to / list presented to : Adult Children  Discharge Placement   Existing PASRR number confirmed : 03/17/21          Patient chooses bed at: [facility] [place] Patient to be transferred to facility by: EMS Name of family member notified: Clarice Pole Patient and family notified of of transfer: 03/18/21  Discharge Plan and Services                                     Social Determinants of Health (SDOH) Interventions     Readmission Risk Interventions No flowsheet data found.

## 2021-03-18 NOTE — Progress Notes (Signed)
[facility]  ROUNDING NOTE   Subjective:   Denise [name] is a 87 y.o. female with past medical history of A-fib on Eliquis, COPD, GERD, diabetes type 2, hypertension and CKD stage 4. She is a current patient of [facility], but has not been to an appointment since July 2021. She presents to ED complaining of swelling and pain in right upper arm. She currently has a AVF at that site. She reported lifting her dog and hearing a pop.   Fistula was placed in 2017 presuming the patient would need dialysis in the near future. AVF has not been used as of date.   Patient seen laying in bed More alert today, but confusion remains Tolerating meals Complains of burning in left arm at procedure site Received Trazadone overnight which resulted in a complete nights sleep  Objective:  Vital signs in last 24 hours:  Temp:  [97.7 F (36.5 C)-99.1 F (37.3 C)] 98.1 F (36.7 C) (05/26 1100) Pulse Rate:  [74-84] 84 (05/26 1100) Resp:  [17-20] 20 (05/26 1100) BP: (151-187)/(47-85) 152/54 (05/26 1100) SpO2:  [97 %-100 %] 100 % (05/26 1100)  Weight change:  Filed Weights   03/11/21 2348  Weight: 77.1 kg    Intake/Output: I/O last 3 completed shifts: In: 240 [P.O.:240] Out: 1700 [Urine:1700]   Intake/Output this shift:  No intake/output data recorded.  Physical Exam: General: NAD, laying in bed  Head: Normocephalic, atraumatic. Moist oral mucosal membranes  Eyes: Anicteric  Lungs:  Clear to auscultation, normal breathing effort  Heart: Regular rate and rhythm  Abdomen:  Soft, nontender  Extremities:  no peripheral edema.   Neurologic: Alert, moving all four extremities  Skin:  Rt arm ecchymosis  Access: Rt AVF    Basic Metabolic Panel: Recent Labs  Lab 03/13/21 0137 03/14/21 0251 03/15/21 0502 03/16/21 0447 03/16/21 0541 03/17/21 0501 03/18/21 0432  NA 144 142 144 143  --   --  146*  K 3.4* 4.7 4.7 4.1  --   --  4.1  CL 111 112* 115* 114*  --   --  116*  CO2 '26 24 22 23   '$ --   --  23  GLUCOSE 76 113* 141* 89  --   --  100*  BUN 32* 44* 52* 53*  --   --  47*  CREATININE 2.28* 2.88* 3.12* 3.12*  --   --  2.12*  CALCIUM 8.3* 8.0* 7.7* 7.9* 7.7*  --  8.1*  MG 2.0  --   --   --   --  2.2  --     Liver Function Tests: Recent Labs  Lab 03/12/21 0037 03/16/21 0447  AST 21 23  ALT 11 9  ALKPHOS 111 76  BILITOT 0.8 1.1  PROT 6.1* 5.1*  ALBUMIN 3.3* 2.6*   No results for input(s): LIPASE, AMYLASE in the last 168 hours. No results for input(s): AMMONIA in the last 168 hours.  CBC: Recent Labs  Lab 03/14/21 0251 03/14/21 0507 03/15/21 0502 03/16/21 0447 03/18/21 0432  WBC 9.0 9.2 9.6 8.9 7.7  HGB 7.1* 7.1* 7.5* 8.4* 8.5*  HCT 21.9* 22.1* 22.5* 24.3* 25.3*  MCV 93.6 93.2 92.2 89.3 90.0  PLT 123* 117* 116* 129* 182    Cardiac Enzymes: No results for input(s): CKTOTAL, CKMB, CKMBINDEX, TROPONINI in the last 168 hours.  BNP: Invalid input(s): POCBNP  CBG: Recent Labs  Lab 03/17/21 0745 03/17/21 1136 03/17/21 1713 03/17/21 2127 03/18/21 0738  GLUCAP 98 94 90 91 90  Microbiology: Results for orders placed or performed during the hospital encounter of 03/11/21  Resp Panel by RT-PCR (Flu A&B, Covid) Nasopharyngeal Swab     Status: None   Collection Time: 03/12/21 12:40 AM   Specimen: Nasopharyngeal Swab; Nasopharyngeal(NP) swabs in vial transport medium  Result Value Ref Range Status   SARS Coronavirus 2 by RT PCR NEGATIVE NEGATIVE Final    Comment: (NOTE) SARS-CoV-2 target nucleic acids are NOT DETECTED.  The SARS-CoV-2 RNA is generally detectable in upper respiratory specimens during the acute phase of infection. The lowest concentration of SARS-CoV-2 viral copies this assay can detect is 138 copies/mL. A negative result does not preclude SARS-Cov-2 infection and should not be used as the sole basis for treatment or other patient management decisions. A negative result may occur with  improper specimen collection/handling,  submission of specimen other than nasopharyngeal swab, presence of viral mutation(s) within the areas targeted by this assay, and inadequate number of viral copies(<138 copies/mL). A negative result must be combined with clinical observations, patient history, and epidemiological information. The expected result is Negative.  Fact Sheet for Patients:  EntrepreneurPulse.com.au  Fact Sheet for Healthcare Providers:  IncredibleEmployment.be  This test is no t yet approved or cleared by the [place] FDA and  has been authorized for detection and/or diagnosis of SARS-CoV-2 by FDA under an Emergency Use Authorization (EUA). This EUA will remain  in effect (meaning this test can be used) for the duration of the COVID-19 declaration under Section 564(b)(1) of the Act, 21 U.S.C.section 360bbb-3(b)(1), unless the authorization is terminated  or revoked sooner.       Influenza A by PCR NEGATIVE NEGATIVE Final   Influenza B by PCR NEGATIVE NEGATIVE Final    Comment: (NOTE) The Xpert Xpress SARS-CoV-2/FLU/RSV plus assay is intended as an aid in the diagnosis of influenza from Nasopharyngeal swab specimens and should not be used as a sole basis for treatment. Nasal washings and aspirates are unacceptable for Xpert Xpress SARS-CoV-2/FLU/RSV testing.  Fact Sheet for Patients: EntrepreneurPulse.com.au  Fact Sheet for Healthcare Providers: IncredibleEmployment.be  This test is not yet approved or cleared by the [place] FDA and has been authorized for detection and/or diagnosis of SARS-CoV-2 by FDA under an Emergency Use Authorization (EUA). This EUA will remain in effect (meaning this test can be used) for the duration of the COVID-19 declaration under Section 564(b)(1) of the Act, 21 U.S.C. section 360bbb-3(b)(1), unless the authorization is terminated or revoked.  Performed at [facility], [address] [place] [place], [place] [address]     Coagulation Studies: No results for input(s): LABPROT, INR in the last 72 hours.  Urinalysis: No results for input(s): COLORURINE, LABSPEC, PHURINE, GLUCOSEU, HGBUR, BILIRUBINUR, KETONESUR, PROTEINUR, UROBILINOGEN, NITRITE, LEUKOCYTESUR in the last 72 hours.  Invalid input(s): APPERANCEUR    Imaging: No results found.   Medications:    . allopurinol  100 mg Oral QPM  . ascorbic acid  250 mg Oral QPC lunch  . atorvastatin  40 mg Oral q morning  . feeding supplement  237 mL Oral BID BM  . fluticasone  2 spray Each Nare Daily  . furosemide  40 mg Oral q morning  . guaiFENesin  600 mg Oral BID  . insulin aspart  0-9 Units Subcutaneous TID AC & HS  . irbesartan  75 mg Oral Daily  . levothyroxine  100 mcg Oral Q0600  . metoprolol succinate  25 mg Oral Q breakfast  . multivitamin with minerals  1  tablet Oral Daily  . pantoprazole  40 mg Oral q morning  . polyethylene glycol  17 g Oral Daily  . tiotropium  1 capsule Inhalation QPC lunch  . traZODone  50 mg Oral QHS   acetaminophen **OR** acetaminophen, haloperidol lactate, hydrALAZINE, magnesium hydroxide, morphine injection, ondansetron **OR** ondansetron (ZOFRAN) IV, oxyCODONE-acetaminophen  Assessment/ Plan:  Ms. SHAYONA LAVOIE is a 87 y.o.  female with past medical history of A-fib on Eliquis, COPD, GERD, diabetes type 2, hypertension and CKD stage 4. She is a current patient of [facility], but has not been to an appointment since July 2021. She presents to ED complaining of swelling and pain in right upper arm.    1. Acute Kidney Injury on chronic kidney disease stage 4 with baseline creatinine 2.28 and GFR of 19 on 03/13/21.  Likely due to blood loss vs anesthesia effects Vascular surgery performed ligation and evacuation of hematoma in right arm on 03/14/21. Fistula now tied off. Currently prescribed irbesartan Creatinine now at baseline IVF d/c'd  Nephrology follow up at  discharge  Lab Results  Component Value Date   CREATININE 2.12 (H) 03/18/2021   CREATININE 3.12 (H) 03/16/2021   CREATININE 3.12 (H) 03/15/2021    Intake/Output Summary (Last 24 hours) at 03/18/2021 1112 Last data filed at 03/17/2021 1940 Gross per 24 hour  Intake 240 ml  Output 950 ml  Net -710 ml   2. Anemia of chronic kidney disease No recent outpatient labs available Lab Results  Component Value Date   HGB 8.5 (L) 03/18/2021   Transfusions given during admission Levels are stable   3. Secondary Hyperparathyroidism:  Lab Results  Component Value Date   PTH 81 (H) 03/16/2021   PTH Comment 03/16/2021   CALCIUM 8.1 (L) 03/18/2021  Calcium belowt target Will monitor with improved appetite  4. Diabetes mellitus type II with chronic kidney disease Recent Hgb A1c 7.0 on 03/12/21 Stable glucose at this time    LOS: 5   5/26/202211:12 AM

## 2021-03-19 DIAGNOSIS — I480 Paroxysmal atrial fibrillation: Secondary | ICD-10-CM | POA: Diagnosis not present

## 2021-03-19 DIAGNOSIS — N184 Chronic kidney disease, stage 4 (severe): Secondary | ICD-10-CM | POA: Diagnosis not present

## 2021-03-19 DIAGNOSIS — G4700 Insomnia, unspecified: Secondary | ICD-10-CM | POA: Diagnosis not present

## 2021-03-19 DIAGNOSIS — S40021D Contusion of right upper arm, subsequent encounter: Secondary | ICD-10-CM | POA: Diagnosis not present

## 2021-03-19 DIAGNOSIS — D649 Anemia, unspecified: Secondary | ICD-10-CM | POA: Diagnosis not present

## 2021-03-22 DIAGNOSIS — M1000 Idiopathic gout, unspecified site: Secondary | ICD-10-CM | POA: Diagnosis not present

## 2021-03-22 DIAGNOSIS — S45991D Other specified injury of unspecified blood vessel at shoulder and upper arm level, right arm, subsequent encounter: Secondary | ICD-10-CM | POA: Diagnosis not present

## 2021-03-22 DIAGNOSIS — I480 Paroxysmal atrial fibrillation: Secondary | ICD-10-CM | POA: Diagnosis not present

## 2021-03-22 DIAGNOSIS — J449 Chronic obstructive pulmonary disease, unspecified: Secondary | ICD-10-CM | POA: Diagnosis not present

## 2021-03-22 DIAGNOSIS — K219 Gastro-esophageal reflux disease without esophagitis: Secondary | ICD-10-CM | POA: Diagnosis not present

## 2021-03-22 DIAGNOSIS — N184 Chronic kidney disease, stage 4 (severe): Secondary | ICD-10-CM | POA: Diagnosis not present

## 2021-03-22 DIAGNOSIS — D649 Anemia, unspecified: Secondary | ICD-10-CM | POA: Diagnosis not present

## 2021-03-22 DIAGNOSIS — E119 Type 2 diabetes mellitus without complications: Secondary | ICD-10-CM | POA: Diagnosis not present

## 2021-03-22 DIAGNOSIS — I10 Essential (primary) hypertension: Secondary | ICD-10-CM | POA: Diagnosis not present

## 2021-03-22 DIAGNOSIS — G4700 Insomnia, unspecified: Secondary | ICD-10-CM | POA: Diagnosis not present

## 2021-03-22 DIAGNOSIS — E569 Vitamin deficiency, unspecified: Secondary | ICD-10-CM | POA: Diagnosis not present

## 2021-03-22 DIAGNOSIS — E785 Hyperlipidemia, unspecified: Secondary | ICD-10-CM | POA: Diagnosis not present

## 2021-03-23 NOTE — Progress Notes (Signed)
Remote pacemaker transmission.   

## 2021-03-24 DIAGNOSIS — N184 Chronic kidney disease, stage 4 (severe): Secondary | ICD-10-CM | POA: Diagnosis not present

## 2021-03-24 DIAGNOSIS — D649 Anemia, unspecified: Secondary | ICD-10-CM | POA: Diagnosis not present

## 2021-03-24 DIAGNOSIS — S40021D Contusion of right upper arm, subsequent encounter: Secondary | ICD-10-CM | POA: Diagnosis not present

## 2021-03-24 DIAGNOSIS — I10 Essential (primary) hypertension: Secondary | ICD-10-CM | POA: Diagnosis not present

## 2021-03-24 DIAGNOSIS — R451 Restlessness and agitation: Secondary | ICD-10-CM | POA: Diagnosis not present

## 2021-03-25 DIAGNOSIS — N390 Urinary tract infection, site not specified: Secondary | ICD-10-CM | POA: Diagnosis not present

## 2021-03-26 DIAGNOSIS — S40021D Contusion of right upper arm, subsequent encounter: Secondary | ICD-10-CM | POA: Diagnosis not present

## 2021-03-26 DIAGNOSIS — R451 Restlessness and agitation: Secondary | ICD-10-CM | POA: Diagnosis not present

## 2021-04-02 ENCOUNTER — Other Ambulatory Visit: Payer: Self-pay

## 2021-04-02 ENCOUNTER — Ambulatory Visit (INDEPENDENT_AMBULATORY_CARE_PROVIDER_SITE_OTHER): Payer: Medicare Other | Admitting: Vascular Surgery

## 2021-04-02 VITALS — BP 130/81 | HR 60 | Resp 16

## 2021-04-02 DIAGNOSIS — S45991D Other specified injury of unspecified blood vessel at shoulder and upper arm level, right arm, subsequent encounter: Secondary | ICD-10-CM

## 2021-04-02 DIAGNOSIS — E1169 Type 2 diabetes mellitus with other specified complication: Secondary | ICD-10-CM

## 2021-04-02 DIAGNOSIS — E785 Hyperlipidemia, unspecified: Secondary | ICD-10-CM

## 2021-04-02 DIAGNOSIS — N184 Chronic kidney disease, stage 4 (severe): Secondary | ICD-10-CM

## 2021-04-02 NOTE — Progress Notes (Signed)
MRN : [id number]  Denise [name] is a 87 y.o. (Aug 25, 1930) female who presents with chief complaint of  Chief Complaint  Patient presents with   Follow-up    [facility] 2wk post ligation of AV fistula and evacuation of hematoma  .  History of Present Illness: Patient returns today in follow up of her right arm hematoma from a spontaneous rupture of a right brachiocephalic AV fistula which was not being used.  This was evacuated and ligated by covering vascular surgeon not from our practice.  Her arm is still bruised and swollen although it is significantly better than it was in the hospital 2 weeks ago.  She is having a lot of hip pain and has previously had hip surgery by one of the local orthopedic surgeons and we recommended she be reassessed by them.  The stump of her AV fistula is still palpable and she wanted to make sure that was okay.  Her wounds are healing well.  Current Outpatient Medications  Medication Sig Dispense Refill   acetaminophen (TYLENOL) 325 MG tablet Take 1-2 tablets (325-650 mg total) by mouth every 4 (four) hours as needed for mild pain (temp > 101.5).     allopurinol (ZYLOPRIM) 100 MG tablet Take 100 mg by mouth every evening.     ascorbic acid (VITAMIN C) 250 MG tablet Take 250 mg by mouth daily after lunch.     atorvastatin (LIPITOR) 40 MG tablet Take 40 mg by mouth every morning.     feeding supplement (ENSURE ENLIVE / ENSURE PLUS) LIQD Take 237 mLs by mouth 2 (two) times daily between meals. 14220 mL 0   fluticasone (FLONASE) 50 MCG/ACT nasal spray Place 2 sprays into both nostrils daily. 9.9 mL 0   furosemide (LASIX) 40 MG tablet Take 40 mg by mouth every morning.     insulin aspart (NOVOLOG) 100 UNIT/ML injection 3 units with meals if eats 10 mL 11   Iron-FA-B Cmp-C-Biot-Probiotic (FUSION PLUS) CAPS Take 1 capsule by mouth daily.     levothyroxine (SYNTHROID) 100 MCG tablet Take 100 mcg by mouth daily before breakfast.      metoprolol succinate (TOPROL-XL) 25 MG  24 hr tablet Take 1 tablet (25 mg total) by mouth daily with breakfast. 30 tablet 0   Multiple Vitamin (MULTIVITAMIN WITH MINERALS) TABS tablet Take 1 tablet by mouth daily. 30 tablet 0   nystatin cream (MYCOSTATIN) Apply 1 application topically 2 (two) times daily. MIX WITH HYDROCORTISONE     pantoprazole (PROTONIX) 40 MG tablet Take 40 mg by mouth every morning.     polyethylene glycol (MIRALAX / GLYCOLAX) 17 g packet Take 17 g by mouth daily. 30 each 0   tiotropium (SPIRIVA HANDIHALER) 18 MCG inhalation capsule Place 1 capsule into inhaler and inhale daily after lunch.     traZODone (DESYREL) 50 MG tablet Take 1 tablet (50 mg total) by mouth at bedtime. 30 tablet 0   valsartan (DIOVAN) 80 MG tablet Take 80 mg by mouth every morning.     White Petrolatum-Mineral Oil (SYSTANE NIGHTTIME) OINT Place 1 application into both eyes at bedtime as needed for dry eyes.     No current facility-administered medications for this visit.    Past Medical History:  Diagnosis Date   A-fib [facility])    Arthritis    Cataract    Chronic kidney disease    COPD (chronic obstructive pulmonary disease) ([place])    Diabetes mellitus without complication (HCC)    Emphysema of  lung ([place])    GERD (gastroesophageal reflux disease)    Hypertension    Hypothyroidism     Past Surgical History:  Procedure Laterality Date   AV FISTULA PLACEMENT Right 11/18/2015   Procedure: ARTERIOVENOUS (AV) FISTULA CREATION;  Surgeon: Algernon Huxley, MD;  Location: [facility] ORS;  Service: Vascular;  Laterality: Right;   CHOLECYSTECTOMY     GALLBLADDER SURGERY     HIP ARTHROPLASTY Right 07/31/2015   Procedure: ARTHROPLASTY BIPOLAR HIP (HEMIARTHROPLASTY);  Surgeon: Corky Mull, MD;  Location: [facility] ORS;  Service: Orthopedics;  Laterality: Right;   LIGATION OF ARTERIOVENOUS  FISTULA Right 03/14/2021   Procedure: LIGATION OF ARTERIOVENOUS  FISTULA  AND EVACUATION OF HEMATOMA;  Surgeon: Conrad Skid[place], MD;  Location: [facility] ORS;  Service: Vascular;   Laterality: Right;   PACEMAKER IMPLANT N/A 11/29/2019   Procedure: PACEMAKER IMPLANT;  Surgeon: Constance Haw, MD;  Location: [facility];  Service: Cardiovascular;  Laterality: N/A;   TEMPORARY PACEMAKER N/A 11/29/2019   Procedure: TEMPORARY PACEMAKER;  Surgeon: Isaias Cowman, MD;  Location: [facility] [facility];  Service: Cardiovascular;  Laterality: N/A;     Social History   Tobacco Use   Smoking status: Former    Pack years: 0.00    Types: Cigarettes    Quit date: 07/28/2013    Years since quitting: 7.6   Smokeless tobacco: Never  Substance Use Topics   Alcohol use: No   Drug use: No       Family History  Problem Relation Age of Onset   Diabetes Mother    Heart attack Mother    Stomach cancer Father    Colon cancer Sister    Diabetes Sister    Heart attack Brother    Colon cancer Brother    Breast cancer Neg Hx      Allergies  Allergen Reactions   Iodinated Diagnostic Agents Anaphylaxis, Other (See Comments) and Rash   Benzocaine-Chloroxylenol-Hc     Years ago   Benzonatate Rash    Note: pruritis     REVIEW OF SYSTEMS (Negative unless checked)  Constitutional: '[]'$ Weight loss  '[]'$ Fever  '[]'$ Chills Cardiac: '[]'$ Chest pain   '[]'$ Chest pressure   '[]'$ Palpitations   '[]'$ Shortness of breath when laying flat   '[]'$ Shortness of breath at rest   '[]'$ Shortness of breath with exertion. Vascular:  '[]'$ Pain in legs with walking   '[]'$ Pain in legs at rest   '[]'$ Pain in legs when laying flat   '[]'$ Claudication   '[]'$ Pain in feet when walking  '[]'$ Pain in feet at rest  '[]'$ Pain in feet when laying flat   '[]'$ History of DVT   '[]'$ Phlebitis   '[x]'$ Swelling in legs   '[]'$ Varicose veins   '[]'$ Non-healing ulcers Pulmonary:   '[]'$ Uses home oxygen   '[]'$ Productive cough   '[]'$ Hemoptysis   '[]'$ Wheeze  '[]'$ COPD   '[]'$ Asthma Neurologic:  '[]'$ Dizziness  '[]'$ Blackouts   '[]'$ Seizures   '[]'$ History of stroke   '[]'$ History of TIA  '[]'$ Aphasia   '[]'$ Temporary blindness   '[]'$ Dysphagia   '[]'$ Weakness or numbness in arms   '[]'$ Weakness or numbness  in legs Musculoskeletal:  '[x]'$ Arthritis   '[]'$ Joint swelling   '[x]'$ Joint pain   '[]'$ Low back pain Hematologic:  '[]'$ Easy bruising  '[]'$ Easy bleeding   '[]'$ Hypercoagulable state   '[x]'$ Anemic   Gastrointestinal:  '[]'$ Blood in stool   '[]'$ Vomiting blood  '[x]'$ Gastroesophageal reflux/heartburn   '[]'$ Abdominal pain Genitourinary:  '[x]'$ Chronic kidney disease   '[]'$ Difficult urination  '[]'$ Frequent urination  '[]'$ Burning with urination   '[]'$ Hematuria Skin:  '[]'$ Rashes   '[]'$   Ulcers   '[]'$ Wounds Psychological:  '[]'$ History of anxiety   '[]'$  History of major depression.  Physical Examination  BP 130/81 (BP Location: Left Arm)   Pulse 60   Resp 16  Gen:  WD/WN, NAD Head: [place]/AT, No temporalis wasting. Ear/Nose/Throat: Hearing grossly intact, nares w/o erythema or drainage Eyes: Conjunctiva clear. Sclera non-icteric Neck: Supple.  Trachea midline Pulmonary:  Good air movement, no use of accessory muscles.  Cardiac: Irregular Vascular right arm with 2+ upper extremity edema.  Firmness and induration is still present.  The swelling in the lower part of the arm is significantly improved from what it was in the hospital but is now fairly mild.  Incisions themselves are healing well. Vessel Right Left  Radial Palpable Palpable               Musculoskeletal: M/S 5/5 throughout.  No deformity or atrophy.  In a wheelchair.  1+ bilateral lower extremity edema. Neurologic: Sensation grossly intact in extremities.  Symmetrical.  Speech is fluent.  Psychiatric: Judgment intact, Mood & affect appropriate for pt's clinical situation. Dermatologic: No rashes or ulcers noted.  No cellulitis or open wounds.      Labs Recent Results (from the past 2160 hour(s))  CUP PACEART REMOTE DEVICE CHECK     Status: None   Collection Time: 03/01/21  2:00 AM  Result Value Ref Range   Date Time Interrogation Session [id number]    Pulse Generator Manufacturer [employer]    Pulse Gen Model 2272 Assurity MRI    Pulse Gen Serial Number [id number]    Clinic Name  [facility]    Implantable Pulse Generator Type Implantable Pulse Generator    Implantable Pulse Generator Implant Date [id number]    Implantable Lead Manufacturer [facility]    Implantable Lead Model LPA1200M Tendril MRI    Implantable Lead Serial Number [id number]    Implantable Lead Implant Date [id number]    Implantable Lead Location Detail 1 UNKNOWN    Implantable Lead Location [id number]    Implantable Lead Manufacturer [facility]    Implantable Lead Model LPA1200M Tendril MRI    Implantable Lead Serial Number [id number]    Implantable Lead Implant Date [id number]    Implantable Lead Location Detail 1 APEX    Implantable Lead Location [id number]    Lead Channel Setting Sensing Sensitivity 2.0 mV   Lead Channel Setting Sensing Adaptation Mode Fixed Pacing    Lead Channel Setting Pacing Amplitude 3.5 V   Lead Channel Setting Pacing Pulse Width 0.5 ms   Lead Channel Setting Pacing Amplitude 3.5 V   Lead Channel Status NULL    Lead Channel Impedance Value 430 ohm   Lead Channel Sensing Intrinsic Amplitude 1.5 mV   Lead Channel Pacing Threshold Amplitude 0.75 V   Lead Channel Pacing Threshold Pulse Width 0.5 ms   Lead Channel Status NULL    Lead Channel Impedance Value 510 ohm   Lead Channel Sensing Intrinsic Amplitude 12.0 mV   Lead Channel Pacing Threshold Amplitude 0.5 V   Lead Channel Pacing Threshold Pulse Width 0.5 ms   Battery Status MOS    Battery Remaining Longevity 73 mo   Battery Remaining Percentage 95.5 %   Battery Voltage 2.99 V   Brady Statistic RA Percent Paced 76.0 %   Brady Statistic RV Percent Paced 1.0 %   Brady Statistic AP VP Percent 1.0 %   Brady Statistic AS VP Percent 1.0 %   Brady Statistic AP VS Percent 79.0 %   [employer]  AS VS Percent 19.0 %  CBC     Status: Abnormal   Collection Time: 03/12/21 12:37 AM  Result Value Ref Range   WBC 8.7 4.0 - 10.5 K/uL   RBC 3.24 (L) 3.87 - 5.11 MIL/uL   Hemoglobin 9.8 (L) 12.0 - 15.0 g/dL   HCT 30.1 (L) 36.0 - 46.0 %   MCV  92.9 80.0 - 100.0 fL   MCH 30.2 26.0 - 34.0 pg   MCHC 32.6 30.0 - 36.0 g/dL   RDW 13.9 11.5 - 15.5 %   Platelets 136 (L) 150 - 400 K/uL   nRBC 0.0 0.0 - 0.2 %    Comment: Performed at [facility], [address] [place], [place] [address]  Comprehensive metabolic panel     Status: Abnormal   Collection Time: 03/12/21 12:37 AM  Result Value Ref Range   Sodium 143 135 - 145 mmol/L   Potassium 4.1 3.5 - 5.1 mmol/L   Chloride 109 98 - 111 mmol/L   CO2 25 22 - 32 mmol/L   Glucose, Bld 139 (H) 70 - 99 mg/dL    Comment: Glucose reference range applies only to samples taken after fasting for at least 8 hours.   BUN 35 (H) 8 - 23 mg/dL   Creatinine, Ser 2.65 (H) 0.44 - 1.00 mg/dL   Calcium 8.4 (L) 8.9 - 10.3 mg/dL   Total Protein 6.1 (L) 6.5 - 8.1 g/dL   Albumin 3.3 (L) 3.5 - 5.0 g/dL   AST 21 15 - 41 U/L   ALT 11 0 - 44 U/L   Alkaline Phosphatase 111 38 - 126 U/L   Total Bilirubin 0.8 0.3 - 1.2 mg/dL   GFR, Estimated 17 (L) >60 mL/min    Comment: (NOTE) Calculated using the CKD-EPI Creatinine Equation (2021)    Anion gap 9 5 - 15    Comment: Performed at [facility], [address] [place], [place] [address]  Protime-INR     Status: Abnormal   Collection Time: 03/12/21 12:37 AM  Result Value Ref Range   Prothrombin Time 16.9 (H) 11.4 - 15.2 seconds   INR 1.4 (H) 0.8 - 1.2    Comment: (NOTE) INR goal varies based on device and disease states. Performed at [facility], [place],  [address]   Resp Panel by RT-PCR (Flu A&B, Covid) Nasopharyngeal Swab     Status: None   Collection Time: 03/12/21 12:40 AM   Specimen: Nasopharyngeal Swab; Nasopharyngeal(NP) swabs in vial transport medium  Result Value Ref Range   SARS Coronavirus 2 by RT PCR NEGATIVE NEGATIVE    Comment: (NOTE) SARS-CoV-2 target nucleic acids are NOT DETECTED.  The SARS-CoV-2 RNA is generally detectable in upper respiratory specimens during the acute phase of  infection. The lowest concentration of SARS-CoV-2 viral copies this assay can detect is 138 copies/mL. A negative result does not preclude SARS-Cov-2 infection and should not be used as the sole basis for treatment or other patient management decisions. A negative result may occur with  improper specimen collection/handling, submission of specimen other than nasopharyngeal swab, presence of viral mutation(s) within the areas targeted by this assay, and inadequate number of viral copies(<138 copies/mL). A negative result must be combined with clinical observations, patient history, and epidemiological information. The expected result is Negative.  Fact Sheet for Patients:  EntrepreneurPulse.com.au  Fact Sheet for Healthcare Providers:  IncredibleEmployment.be  This test is no t yet approved or cleared by the [place]  FDA and  has been authorized for detection and/or diagnosis of SARS-CoV-2 by FDA under an Emergency Use Authorization (EUA). This EUA will remain  in effect (meaning this test can be used) for the duration of the COVID-19 declaration under Section 564(b)(1) of the Act, 21 U.S.C.section 360bbb-3(b)(1), unless the authorization is terminated  or revoked sooner.       Influenza A by PCR NEGATIVE NEGATIVE   Influenza B by PCR NEGATIVE NEGATIVE    Comment: (NOTE) The Xpert Xpress SARS-CoV-2/FLU/RSV plus assay is intended as an aid in the diagnosis of influenza from Nasopharyngeal swab specimens and should not be used as a sole basis for treatment. Nasal washings and aspirates are unacceptable for Xpert Xpress SARS-CoV-2/FLU/RSV testing.  Fact Sheet for Patients: EntrepreneurPulse.com.au  Fact Sheet for Healthcare Providers: IncredibleEmployment.be  This test is not yet approved or cleared by the [place] FDA and has been authorized for detection and/or diagnosis of SARS-CoV-2 by FDA  under an Emergency Use Authorization (EUA). This EUA will remain in effect (meaning this test can be used) for the duration of the COVID-19 declaration under Section 564(b)(1) of the Act, 21 U.S.C. section 360bbb-3(b)(1), unless the authorization is terminated or revoked.  Performed at [facility], [address] [place], [place] [id number]   Basic metabolic panel     Status: Abnormal   Collection Time: 03/12/21  6:08 AM  Result Value Ref Range   Sodium 144 135 - 145 mmol/L   Potassium 3.9 3.5 - 5.1 mmol/L   Chloride 110 98 - 111 mmol/L   CO2 28 22 - 32 mmol/L   Glucose, Bld 123 (H) 70 - 99 mg/dL    Comment: Glucose reference range applies only to samples taken after fasting for at least 8 hours.   BUN 35 (H) 8 - 23 mg/dL   Creatinine, Ser 2.53 (H) 0.44 - 1.00 mg/dL   Calcium 8.7 (L) 8.9 - 10.3 mg/dL   GFR, Estimated 18 (L) >60 mL/min    Comment: (NOTE) Calculated using the CKD-EPI Creatinine Equation (2021)    Anion gap 6 5 - 15    Comment: Performed at [facility], [address] [place], [place] [address]  CBC     Status: Abnormal   Collection Time: 03/12/21  6:08 AM  Result Value Ref Range   WBC 8.2 4.0 - 10.5 K/uL   RBC 3.27 (L) 3.87 - 5.11 MIL/uL   Hemoglobin 9.9 (L) 12.0 - 15.0 g/dL   HCT 30.5 (L) 36.0 - 46.0 %   MCV 93.3 80.0 - 100.0 fL   MCH 30.3 26.0 - 34.0 pg   MCHC 32.5 30.0 - 36.0 g/dL   RDW 14.0 11.5 - 15.5 %   Platelets 124 (L) 150 - 400 K/uL   nRBC 0.0 0.0 - 0.2 %    Comment: Performed at [facility], [address] [place], [place] [address]  Hemoglobin A1c     Status: Abnormal   Collection Time: 03/12/21  6:08 AM  Result Value Ref Range   Hgb A1c MFr Bld 7.0 (H) 4.8 - 5.6 %    Comment: (NOTE) Pre diabetes:          5.7%-6.4%  Diabetes:              >6.4%  Glycemic control for   <7.0% adults with diabetes    Mean Plasma Glucose 154.2 mg/dL    Comment: Performed at [facility], [place],  [place] [address]  Glucose, capillary     Status: Abnormal   Collection Time: 03/12/21  7:58 AM  Result Value Ref Range   Glucose-Capillary 114 (H) 70 - 99 mg/dL    Comment: Glucose reference range applies only to samples taken after fasting for at least 8 hours.  Glucose, capillary     Status: None   Collection Time: 03/12/21 11:33 AM  Result Value Ref Range   Glucose-Capillary 82 70 - 99 mg/dL    Comment: Glucose reference range applies only to samples taken after fasting for at least 8 hours.  Glucose, capillary     Status: Abnormal   Collection Time: 03/12/21  3:11 PM  Result Value Ref Range   Glucose-Capillary 123 (H) 70 - 99 mg/dL    Comment: Glucose reference range applies only to samples taken after fasting for at least 8 hours.  Glucose, capillary     Status: Abnormal   Collection Time: 03/12/21  9:44 PM  Result Value Ref Range   Glucose-Capillary 143 (H) 70 - 99 mg/dL    Comment: Glucose reference range applies only to samples taken after fasting for at least 8 hours.  CBC     Status: Abnormal   Collection Time: 03/13/21  1:37 AM  Result Value Ref Range   WBC 10.0 4.0 - 10.5 K/uL   RBC 3.12 (L) 3.87 - 5.11 MIL/uL   Hemoglobin 9.4 (L) 12.0 - 15.0 g/dL   HCT 29.1 (L) 36.0 - 46.0 %   MCV 93.3 80.0 - 100.0 fL   MCH 30.1 26.0 - 34.0 pg   MCHC 32.3 30.0 - 36.0 g/dL   RDW 13.9 11.5 - 15.5 %   Platelets 139 (L) 150 - 400 K/uL   nRBC 0.0 0.0 - 0.2 %    Comment: Performed at [facility], [address], [place], [place] [id number]  Basic metabolic panel     Status: Abnormal   Collection Time: 03/13/21  1:37 AM  Result Value Ref Range   Sodium 144 135 - 145 mmol/L   Potassium 3.4 (L) 3.5 - 5.1 mmol/L   Chloride 111 98 - 111 mmol/L   CO2 26 22 - 32 mmol/L   Glucose, Bld 76 70 - 99 mg/dL    Comment: Glucose reference range applies only to samples taken after fasting for at least 8 hours.   BUN 32 (H) 8 - 23 mg/dL   Creatinine, Ser 2.28 (H) 0.44 - 1.00 mg/dL    Calcium 8.3 (L) 8.9 - 10.3 mg/dL   GFR, Estimated 20 (L) >60 mL/min    Comment: (NOTE) Calculated using the CKD-EPI Creatinine Equation (2021)    Anion gap 7 5 - 15    Comment: Performed at [facility], [address] [place], [place] [address]  Magnesium     Status: None   Collection Time: 03/13/21  1:37 AM  Result Value Ref Range   Magnesium 2.0 1.7 - 2.4 mg/dL    Comment: Performed at [facility], [address] [place], [place] [address]  Glucose, capillary     Status: Abnormal   Collection Time: 03/13/21 11:32 AM  Result Value Ref Range   Glucose-Capillary 106 (H) 70 - 99 mg/dL    Comment: Glucose reference range applies only to samples taken after fasting for at least 8 hours.  Glucose, capillary     Status: None   Collection Time: 03/13/21  4:13 PM  Result Value Ref Range   Glucose-Capillary 87 70 -  99 mg/dL    Comment: Glucose reference range applies only to samples taken after fasting for at least 8 hours.  Glucose, capillary     Status: Abnormal   Collection Time: 03/13/21 10:49 PM  Result Value Ref Range   Glucose-Capillary 159 (H) 70 - 99 mg/dL    Comment: Glucose reference range applies only to samples taken after fasting for at least 8 hours.  CBC     Status: Abnormal   Collection Time: 03/14/21  2:51 AM  Result Value Ref Range   WBC 9.0 4.0 - 10.5 K/uL   RBC 2.34 (L) 3.87 - 5.11 MIL/uL   Hemoglobin 7.1 (L) 12.0 - 15.0 g/dL   HCT 21.9 (L) 36.0 - 46.0 %   MCV 93.6 80.0 - 100.0 fL   MCH 30.3 26.0 - 34.0 pg   MCHC 32.4 30.0 - 36.0 g/dL   RDW 14.2 11.5 - 15.5 %   Platelets 123 (L) 150 - 400 K/uL    Comment: Immature Platelet Fraction may be clinically indicated, consider ordering this additional test [id number]    nRBC 0.0 0.0 - 0.2 %    Comment: Performed at [facility], [address], [place], [place] [id number]  Basic metabolic panel     Status: Abnormal   Collection Time: 03/14/21  2:51 AM  Result Value Ref Range   Sodium  142 135 - 145 mmol/L   Potassium 4.7 3.5 - 5.1 mmol/L   Chloride 112 (H) 98 - 111 mmol/L   CO2 24 22 - 32 mmol/L   Glucose, Bld 113 (H) 70 - 99 mg/dL    Comment: Glucose reference range applies only to samples taken after fasting for at least 8 hours.   BUN 44 (H) 8 - 23 mg/dL   Creatinine, Ser 2.88 (H) 0.44 - 1.00 mg/dL   Calcium 8.0 (L) 8.9 - 10.3 mg/dL   GFR, Estimated 15 (L) >60 mL/min    Comment: (NOTE) Calculated using the CKD-EPI Creatinine Equation (2021)    Anion gap 6 5 - 15    Comment: Performed at [facility], [address] [place], [place] [address]  CBC     Status: Abnormal   Collection Time: 03/14/21  5:07 AM  Result Value Ref Range   WBC 9.2 4.0 - 10.5 K/uL   RBC 2.37 (L) 3.87 - 5.11 MIL/uL   Hemoglobin 7.1 (L) 12.0 - 15.0 g/dL   HCT 22.1 (L) 36.0 - 46.0 %   MCV 93.2 80.0 - 100.0 fL   MCH 30.0 26.0 - 34.0 pg   MCHC 32.1 30.0 - 36.0 g/dL   RDW 14.1 11.5 - 15.5 %   Platelets 117 (L) 150 - 400 K/uL    Comment: Immature Platelet Fraction may be clinically indicated, consider ordering this additional test [id number]    nRBC 0.0 0.0 - 0.2 %    Comment: Performed at [facility], [address] [place], [place] [address]  Prepare RBC (crossmatch)     Status: None   Collection Time: 03/14/21  6:10 AM  Result Value Ref Range   Order Confirmation      ORDER PROCESSED BY BLOOD BANK Performed at [facility], [address] [place], [place] [address]   Type and screen [place]     Status: None   Collection Time: 03/14/21  7:29 AM  Result Value Ref Range   ABO/RH(D) O POS    Antibody Screen NEG    Sample Expiration  03/17/2021,2359    Unit Number [id number]    Blood Component Type RED CELLS,LR    Unit division 00    Status of Unit ISSUED,FINAL    Transfusion Status OK TO TRANSFUSE    Crossmatch Result Compatible    Unit Number [id number]    Blood Component Type RBC LR PHER1    Unit division 00     Status of Unit ISSUED,FINAL    Transfusion Status OK TO TRANSFUSE    Crossmatch Result      Compatible Performed at [facility], [address] [place], [place] [address]   BPAM RBC     Status: None   Collection Time: 03/14/21  7:29 AM  Result Value Ref Range   ISSUE DATE / TIME [id number]    Blood Product Unit Number [id number]    PRODUCT CODE [id number]    Unit Type and Rh 5100    Blood Product Expiration Date [id number]    ISSUE DATE / TIME [id number]    Blood Product Unit Number [id number]    PRODUCT CODE [id number]    Unit Type and Rh 5100    Blood Product Expiration Date [id number]   Glucose, capillary     Status: None   Collection Time: 03/14/21  7:49 AM  Result Value Ref Range   Glucose-Capillary 97 70 - 99 mg/dL    Comment: Glucose reference range applies only to samples taken after fasting for at least 8 hours.  Glucose, capillary     Status: Abnormal   Collection Time: 03/14/21 11:25 AM  Result Value Ref Range   Glucose-Capillary 110 (H) 70 - 99 mg/dL    Comment: Glucose reference range applies only to samples taken after fasting for at least 8 hours.  Troponin I (High Sensitivity)     Status: Abnormal   Collection Time: 03/14/21 12:18 PM  Result Value Ref Range   Troponin I (High Sensitivity) 42 (H) <18 ng/L    Comment: (NOTE) Elevated high sensitivity troponin I (hsTnI) values and significant  changes across serial measurements may suggest ACS but many other  chronic and acute conditions are known to elevate hsTnI results.  Refer to the "Links" section for chest pain algorithms and additional  guidance. Performed at [facility], [address] [place], [place] [address]   Glucose, capillary     Status: Abnormal   Collection Time: 03/14/21  4:41 PM  Result Value Ref Range   Glucose-Capillary 134 (H) 70 - 99 mg/dL    Comment: Glucose reference range applies only to samples taken after fasting for at least 8 hours.  Troponin I  (High Sensitivity)     Status: Abnormal   Collection Time: 03/14/21  6:05 PM  Result Value Ref Range   Troponin I (High Sensitivity) 34 (H) <18 ng/L    Comment: (NOTE) Elevated high sensitivity troponin I (hsTnI) values and significant  changes across serial measurements may suggest ACS but many other  chronic and acute conditions are known to elevate hsTnI results.  Refer to the "Links" section for chest pain algorithms and additional  guidance. Performed at [facility], [address] [place],  [address]   Glucose, capillary     Status: Abnormal   Collection Time: 03/14/21 10:01 PM  Result Value Ref Range   Glucose-Capillary 149 (H) 70 - 99 mg/dL    Comment: Glucose reference range applies only to samples taken after fasting for at least 8 hours.  CBC     Status: Abnormal  Collection Time: 03/15/21  5:02 AM  Result Value Ref Range   WBC 9.6 4.0 - 10.5 K/uL   RBC 2.44 (L) 3.87 - 5.11 MIL/uL   Hemoglobin 7.5 (L) 12.0 - 15.0 g/dL   HCT 22.5 (L) 36.0 - 46.0 %   MCV 92.2 80.0 - 100.0 fL   MCH 30.7 26.0 - 34.0 pg   MCHC 33.3 30.0 - 36.0 g/dL   RDW 14.4 11.5 - 15.5 %   Platelets 116 (L) 150 - 400 K/uL    Comment: Immature Platelet Fraction may be clinically indicated, consider ordering this additional test [id number]    nRBC 0.2 0.0 - 0.2 %    Comment: Performed at [facility], [address] [place], [place] [id number]  Basic metabolic panel     Status: Abnormal   Collection Time: 03/15/21  5:02 AM  Result Value Ref Range   Sodium 144 135 - 145 mmol/L   Potassium 4.7 3.5 - 5.1 mmol/L   Chloride 115 (H) 98 - 111 mmol/L   CO2 22 22 - 32 mmol/L   Glucose, Bld 141 (H) 70 - 99 mg/dL    Comment: Glucose reference range applies only to samples taken after fasting for at least 8 hours.   BUN 52 (H) 8 - 23 mg/dL   Creatinine, Ser 3.12 (H) 0.44 - 1.00 mg/dL   Calcium 7.7 (L) 8.9 - 10.3 mg/dL   GFR, Estimated 14 (L) >60 mL/min    Comment:  (NOTE) Calculated using the CKD-EPI Creatinine Equation (2021)    Anion gap 7 5 - 15    Comment: Performed at [facility], [address] [place], [place] [address]  Glucose, capillary     Status: Abnormal   Collection Time: 03/15/21  7:37 AM  Result Value Ref Range   Glucose-Capillary 113 (H) 70 - 99 mg/dL    Comment: Glucose reference range applies only to samples taken after fasting for at least 8 hours.  Prepare RBC (crossmatch)     Status: None   Collection Time: 03/15/21  8:30 AM  Result Value Ref Range   Order Confirmation      ORDER PROCESSED BY BLOOD BANK Performed at [facility], [address] [place], [place] [address]   Glucose, capillary     Status: Abnormal   Collection Time: 03/15/21 11:43 AM  Result Value Ref Range   Glucose-Capillary 152 (H) 70 - 99 mg/dL    Comment: Glucose reference range applies only to samples taken after fasting for at least 8 hours.  Glucose, capillary     Status: Abnormal   Collection Time: 03/15/21  4:04 PM  Result Value Ref Range   Glucose-Capillary 143 (H) 70 - 99 mg/dL    Comment: Glucose reference range applies only to samples taken after fasting for at least 8 hours.  Glucose, capillary     Status: Abnormal   Collection Time: 03/15/21  9:23 PM  Result Value Ref Range   Glucose-Capillary 146 (H) 70 - 99 mg/dL    Comment: Glucose reference range applies only to samples taken after fasting for at least 8 hours.  Comprehensive metabolic panel     Status: Abnormal   Collection Time: 03/16/21  4:47 AM  Result Value Ref Range   Sodium 143 135 - 145 mmol/L   Potassium 4.1 3.5 - 5.1 mmol/L   Chloride 114 (H) 98 - 111 mmol/L   CO2 23 22 - 32 mmol/L   Glucose, Bld 89 70 -  99 mg/dL    Comment: Glucose reference range applies only to samples taken after fasting for at least 8 hours.   BUN 53 (H) 8 - 23 mg/dL   Creatinine, Ser 3.12 (H) 0.44 - 1.00 mg/dL   Calcium 7.9 (L) 8.9 - 10.3 mg/dL   Total Protein 5.1 (L) 6.5  - 8.1 g/dL   Albumin 2.6 (L) 3.5 - 5.0 g/dL   AST 23 15 - 41 U/L   ALT 9 0 - 44 U/L   Alkaline Phosphatase 76 38 - 126 U/L   Total Bilirubin 1.1 0.3 - 1.2 mg/dL   GFR, Estimated 14 (L) >60 mL/min    Comment: (NOTE) Calculated using the CKD-EPI Creatinine Equation (2021)    Anion gap 6 5 - 15    Comment: Performed at [facility], [address] [place], [place] [address]  CBC     Status: Abnormal   Collection Time: 03/16/21  4:47 AM  Result Value Ref Range   WBC 8.9 4.0 - 10.5 K/uL   RBC 2.72 (L) 3.87 - 5.11 MIL/uL   Hemoglobin 8.4 (L) 12.0 - 15.0 g/dL   HCT 24.3 (L) 36.0 - 46.0 %   MCV 89.3 80.0 - 100.0 fL   MCH 30.9 26.0 - 34.0 pg   MCHC 34.6 30.0 - 36.0 g/dL   RDW 15.3 11.5 - 15.5 %   Platelets 129 (L) 150 - 400 K/uL   nRBC 0.2 0.0 - 0.2 %    Comment: Performed at [facility], [address] [place], [place] [address]  PTH, intact and calcium     Status: Abnormal   Collection Time: 03/16/21  5:41 AM  Result Value Ref Range   PTH 81 (H) 15 - 65 pg/mL   Calcium, Total (PTH) 7.7 (L) 8.7 - 10.3 mg/dL   PTH Interp Comment     Comment: (NOTE) Interpretation                 Intact PTH    Calcium                                (pg/mL)      (mg/dL) Normal                          15 - 65     8.6 - 10.2 Primary Hyperparathyroidism         >65          >10.2 Secondary Hyperparathyroidism       >65          <10.2 Non-Parathyroid Hypercalcemia       <65          >10.2 Hypoparathyroidism                  <15          < 8.6 Non-Parathyroid Hypocalcemia    15 - 65          < 8.6 Performed At: [facility] [place], [place] [id number] Rush Farmer MD [id number]   Glucose, capillary     Status: None   Collection Time: 03/16/21  7:38 AM  Result Value Ref Range   Glucose-Capillary 82 70 - 99 mg/dL    Comment: Glucose reference range applies only to samples taken after fasting for at least 8 hours.  Glucose, capillary  Status:  Abnormal   Collection Time: 03/16/21 11:57 AM  Result Value Ref Range   Glucose-Capillary 103 (H) 70 - 99 mg/dL    Comment: Glucose reference range applies only to samples taken after fasting for at least 8 hours.  Glucose, capillary     Status: None   Collection Time: 03/16/21  4:33 PM  Result Value Ref Range   Glucose-Capillary 90 70 - 99 mg/dL    Comment: Glucose reference range applies only to samples taken after fasting for at least 8 hours.   Comment 1 Notify RN   Glucose, capillary     Status: Abnormal   Collection Time: 03/16/21  9:45 PM  Result Value Ref Range   Glucose-Capillary 134 (H) 70 - 99 mg/dL    Comment: Glucose reference range applies only to samples taken after fasting for at least 8 hours.   Comment 1 Notify RN   Magnesium     Status: None   Collection Time: 03/17/21  5:01 AM  Result Value Ref Range   Magnesium 2.2 1.7 - 2.4 mg/dL    Comment: Performed at [facility], [address] [place], [place] [address]  Glucose, capillary     Status: None   Collection Time: 03/17/21  7:45 AM  Result Value Ref Range   Glucose-Capillary 98 70 - 99 mg/dL    Comment: Glucose reference range applies only to samples taken after fasting for at least 8 hours.   Comment 1 Notify RN   Glucose, capillary     Status: None   Collection Time: 03/17/21 11:36 AM  Result Value Ref Range   Glucose-Capillary 94 70 - 99 mg/dL    Comment: Glucose reference range applies only to samples taken after fasting for at least 8 hours.  Glucose, capillary     Status: None   Collection Time: 03/17/21  5:13 PM  Result Value Ref Range   Glucose-Capillary 90 70 - 99 mg/dL    Comment: Glucose reference range applies only to samples taken after fasting for at least 8 hours.  Glucose, capillary     Status: None   Collection Time: 03/17/21  9:27 PM  Result Value Ref Range   Glucose-Capillary 91 70 - 99 mg/dL    Comment: Glucose reference range applies only to samples taken after fasting  for at least 8 hours.   Comment 1 Notify RN    Comment 2 Document in Chart   Basic metabolic panel     Status: Abnormal   Collection Time: 03/18/21  4:32 AM  Result Value Ref Range   Sodium 146 (H) 135 - 145 mmol/L   Potassium 4.1 3.5 - 5.1 mmol/L   Chloride 116 (H) 98 - 111 mmol/L   CO2 23 22 - 32 mmol/L   Glucose, Bld 100 (H) 70 - 99 mg/dL    Comment: Glucose reference range applies only to samples taken after fasting for at least 8 hours.   BUN 47 (H) 8 - 23 mg/dL   Creatinine, Ser 2.12 (H) 0.44 - 1.00 mg/dL   Calcium 8.1 (L) 8.9 - 10.3 mg/dL   GFR, Estimated 22 (L) >60 mL/min    Comment: (NOTE) Calculated using the CKD-EPI Creatinine Equation (2021)    Anion gap 7 5 - 15    Comment: Performed at [facility], [address], [place], [place] [address]  CBC     Status: Abnormal   Collection Time: 03/18/21  4:32 AM  Result Value Ref Range  WBC 7.7 4.0 - 10.5 K/uL   RBC 2.81 (L) 3.87 - 5.11 MIL/uL   Hemoglobin 8.5 (L) 12.0 - 15.0 g/dL   HCT 25.3 (L) 36.0 - 46.0 %   MCV 90.0 80.0 - 100.0 fL   MCH 30.2 26.0 - 34.0 pg   MCHC 33.6 30.0 - 36.0 g/dL   RDW 14.6 11.5 - 15.5 %   Platelets 182 150 - 400 K/uL   nRBC 0.0 0.0 - 0.2 %    Comment: Performed at [facility], [address] [place], [place] [address]  Glucose, capillary     Status: None   Collection Time: 03/18/21  7:38 AM  Result Value Ref Range   Glucose-Capillary 90 70 - 99 mg/dL    Comment: Glucose reference range applies only to samples taken after fasting for at least 8 hours.  Glucose, capillary     Status: Abnormal   Collection Time: 03/18/21 11:35 AM  Result Value Ref Range   Glucose-Capillary 190 (H) 70 - 99 mg/dL    Comment: Glucose reference range applies only to samples taken after fasting for at least 8 hours.  Resp Panel by RT-PCR (Flu A&B, Covid) Nasopharyngeal Swab     Status: None   Collection Time: 03/18/21  2:34 PM   Specimen: Nasopharyngeal Swab; Nasopharyngeal(NP) swabs in  vial transport medium  Result Value Ref Range   SARS Coronavirus 2 by RT PCR NEGATIVE NEGATIVE    Comment: (NOTE) SARS-CoV-2 target nucleic acids are NOT DETECTED.  The SARS-CoV-2 RNA is generally detectable in upper respiratory specimens during the acute phase of infection. The lowest concentration of SARS-CoV-2 viral copies this assay can detect is 138 copies/mL. A negative result does not preclude SARS-Cov-2 infection and should not be used as the sole basis for treatment or other patient management decisions. A negative result may occur with  improper specimen collection/handling, submission of specimen other than nasopharyngeal swab, presence of viral mutation(s) within the areas targeted by this assay, and inadequate number of viral copies(<138 copies/mL). A negative result must be combined with clinical observations, patient history, and epidemiological information. The expected result is Negative.  Fact Sheet for Patients:  EntrepreneurPulse.com.au  Fact Sheet for Healthcare Providers:  IncredibleEmployment.be  This test is no t yet approved or cleared by the [place] FDA and  has been authorized for detection and/or diagnosis of SARS-CoV-2 by FDA under an Emergency Use Authorization (EUA). This EUA will remain  in effect (meaning this test can be used) for the duration of the COVID-19 declaration under Section 564(b)(1) of the Act, 21 U.S.C.section 360bbb-3(b)(1), unless the authorization is terminated  or revoked sooner.       Influenza A by PCR NEGATIVE NEGATIVE   Influenza B by PCR NEGATIVE NEGATIVE    Comment: (NOTE) The Xpert Xpress SARS-CoV-2/FLU/RSV plus assay is intended as an aid in the diagnosis of influenza from Nasopharyngeal swab specimens and should not be used as a sole basis for treatment. Nasal washings and aspirates are unacceptable for Xpert Xpress SARS-CoV-2/FLU/RSV testing.  Fact Sheet for  Patients: EntrepreneurPulse.com.au  Fact Sheet for Healthcare Providers: IncredibleEmployment.be  This test is not yet approved or cleared by the [place] FDA and has been authorized for detection and/or diagnosis of SARS-CoV-2 by FDA under an Emergency Use Authorization (EUA). This EUA will remain in effect (meaning this test can be used) for the duration of the COVID-19 declaration under Section 564(b)(1) of the Act, 21 U.S.C. section 360bbb-3(b)(1), unless the authorization is  terminated or revoked.  Performed at [facility], [address] [place], [place] [address]     Radiology CT HUMERUS RIGHT WO CONTRAST  Result Date: 03/13/2021 CLINICAL DATA:  Acute onset right upper extremity swelling. 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 Multiplanar CT image reconstructions were also generated. COMPARISON:  Arterial duplex ultrasound 03/12/2021 FINDINGS: Bones/Joint/Cartilage No acute fracture or dislocation of the right humerus or right forearm. No significant arthropathy of the glenohumeral joint. Mild AC joint arthropathy. No significant elbow arthropathy. No elbow joint effusion. No suspicious lytic or sclerotic bony lesions. Ligaments Suboptimally assessed by CT. Muscles and Tendons No acute musculotendinous abnormality is evident by CT. No intramuscular fluid collections are evident. Soft tissues Large hyperdense fluid collection within the anterolateral subcutaneous soft tissues of the right upper arm extending approximately from the level of the mid humeral diaphysis to the elbow joint. Collection measures approximately 12.4 x 4.2 x 8.0 cm (series 9, image 50; series 8, image 197). Soft tissue edema  extends circumferentially throughout the upper arm and forearm, most pronounced at the dorsal aspect of the mid to distal forearm. Large tubular serpiginous structure seen within the anterior soft tissues of the upper arm extending into the proximal forearm measuring up to 3.9 cm in diameter compatible with aneurysmal dilation of patient's known AV fistula. This was better characterized on dedicated arterial duplex ultrasound performed 1 day ago. No soft tissue gas. No axillary lymphadenopathy. IMPRESSION: 1. Large hyperdense fluid collection within the anterolateral subcutaneous soft tissues of the right upper arm extending from the level of the mid humeral diaphysis to the elbow joint measuring approximately 12.4 x 4.2 x 8.0 cm, most compatible with a hematoma. 2. Diffuse subcutaneous edema throughout the imaged right upper extremity, nonspecific. Cellulitis not excluded in the appropriate clinical setting. 3. 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 This was better characterized on dedicated arterial duplex ultrasound performed 1 day ago. 4. No acute osseous abnormality. Electronically Signed   By: Davina Poke D.O.   On: 03/13/2021 16:49   CT FOREARM RIGHT WO CONTRAST  Result Date: 03/13/2021 CLINICAL DATA:  Acute onset right upper extremity swelling. 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 Multiplanar CT image reconstructions were also generated. COMPARISON:  Arterial duplex ultrasound 03/12/2021 FINDINGS: Bones/Joint/Cartilage No acute fracture or dislocation of the right humerus or right forearm. No significant arthropathy of the glenohumeral joint.  Mild AC joint arthropathy. No significant elbow arthropathy. No elbow joint effusion. No suspicious lytic or sclerotic bony lesions. Ligaments Suboptimally assessed by CT. Muscles and Tendons No acute musculotendinous abnormality is evident by CT. No intramuscular fluid collections are evident. Soft tissues Large hyperdense fluid collection within the anterolateral subcutaneous soft tissues of the right upper arm extending approximately from the level of the mid humeral diaphysis to the elbow joint. Collection measures approximately 12.4 x 4.2 x 8.0 cm (series 9, image 50; series 8, image 197). Soft tissue edema extends circumferentially throughout the upper arm and forearm, most  pronounced at the dorsal aspect of the mid to distal forearm. Large tubular serpiginous structure seen within the anterior soft tissues of the upper arm extending into the proximal forearm measuring up to 3.9 cm in diameter compatible with aneurysmal dilation of patient's known AV fistula. This was better characterized on dedicated arterial duplex ultrasound performed 1 day ago. No soft tissue gas. No axillary lymphadenopathy. IMPRESSION: 1. Large hyperdense fluid collection within the anterolateral subcutaneous soft tissues of the right upper arm extending from the level of the mid humeral diaphysis to the elbow joint measuring approximately 12.4 x 4.2 x 8.0 cm, most compatible with a hematoma. 2. Diffuse subcutaneous edema throughout the imaged right upper extremity, nonspecific. Cellulitis not excluded in the appropriate clinical setting. 3. 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 This was better characterized on dedicated arterial duplex ultrasound performed 1 day ago. 4. No acute osseous abnormality. Electronically Signed   By: Davina Poke D.O.   On: 03/13/2021 16:49   US RENAL  Result Date: 03/16/2021 CLINICAL  DATA:  Acute renal insufficiency. EXAM: RENAL / URINARY TRACT ULTRASOUND COMPLETE COMPARISON:  CT 07/25/2015. FINDINGS: Right Kidney: Renal measurements: 8.9 x 3.4 x 3.4 cm = volume: 54.6 mL. Cortical thinning. Echogenicity within normal limits. 1.6 cm simple cyst. No hydronephrosis visualized. Tiny amount of right perirenal fluid cannot be excluded. Left Kidney: Renal measurements: 9.6 x 4.5 x 3.4 cm = volume: 75.7 mL. Cortical thinning. Echogenicity within normal limits. 1.1 cm simple cyst. No hydronephrosis visualized. Bladder: Appears normal for degree of bladder distention. Other: None. IMPRESSION: 1. Bilateral renal cortical thinning. Tiny amount of right perirenal fluid cannot be excluded. No hydronephrosis or bladder distention. 2.  Bilateral simple renal cysts. Electronically Signed   By: Marcello Moores  Register   On: 03/16/2021 05:13   US Venous Img Upper Uni Right(DVT)  Result Date: 03/13/2021 CLINICAL DATA:  Right arm swelling and pain EXAM: RIGHT UPPER EXTREMITY VENOUS DOPPLER ULTRASOUND TECHNIQUE: Gray-scale sonography with graded compression, as well as color Doppler and duplex ultrasound were performed to evaluate the upper extremity deep venous system from the level of the subclavian vein and including the jugular, axillary, basilic, radial, ulnar and upper cephalic vein. Spectral Doppler was utilized to evaluate flow at rest and with distal augmentation maneuvers. COMPARISON:  None. FINDINGS: Contralateral Subclavian Vein: Respiratory phasicity is normal and symmetric with the symptomatic side. No evidence of thrombus. Normal compressibility. Internal Jugular Vein: No evidence of thrombus. Normal compressibility, respiratory phasicity and response to augmentation. Subclavian Vein: No evidence of thrombus. Normal compressibility, respiratory phasicity and response to augmentation. Axillary Vein: No evidence of thrombus. Normal compressibility, respiratory phasicity and response to augmentation.  Cephalic Vein: No evidence of thrombus. Normal compressibility, respiratory phasicity and response to augmentation. Basilic Vein: No evidence of thrombus. Normal compressibility, respiratory phasicity and response to augmentation. Brachial Veins: No evidence of thrombus. Normal compressibility, respiratory phasicity and response to augmentation. Radial Veins: No evidence of thrombus. Normal compressibility, respiratory phasicity and response to augmentation. Ulnar Veins: No evidence of thrombus. Normal compressibility, respiratory phasicity and response to augmentation. Other Findings: Mild upper extremity subcutaneous edema noted. Redemonstration of a dilated patent aneurysmal dialysis AV fistula in upper arm. IMPRESSION: No evidence of DVT within the right upper extremity. Electronically Signed   By: Jerilynn Mages.  Shick M.D.   On: 03/13/2021 09:58   [place] UPPER EXTREMITY ARTERIAL RIGHT LIMITED (GRAFT, SINGLE VESSEL)  Result  Date: 03/12/2021 CLINICAL DATA:  Right upper extremity arteriovenous fistula, extensive ecchymosis EXAM: RIGHT UPPER EXTREMITY ARTERIAL DUPLEX SCAN TECHNIQUE: Gray-scale sonography as well as color Doppler and duplex ultrasound was performed to evaluate the arteries of the upper extremity. COMPARISON:  None. FINDINGS: Grayscale and color Doppler sonographic images were obtained of the right upper extremity dialysis fistula. Pulse Doppler sonographic evaluation was not performed. The images demonstrate a right upper extremity brachiocephalic fistula. The arteriovenous anastomosis appears stenotic with probable post not dilation of the a proximal portion of the fistula. Subsequently, there is marked aneurysmal dilation of the proximal portion of the fistula immediately proximal to a high-grade stenosis spanning roughly 2 cm. There is extensive tissue vibration within this region related to flow turbulence through the stenosis. There is moderate interstitial fluid surrounding the outflow cephalic vein  centrally in keeping with perivascular hemorrhage. There is moderate subcutaneous edema within the visualized region of the right upper extremity. IMPRESSION: Right upper extremity brachiocephalic fistula. 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 Formal dialysis fistulagram is recommended for further evaluation. Electronically Signed   By: Fidela Salisbury MD   On: 03/12/2021 04:52    Assessment/Plan  Intramural hematoma of artery of right upper extremity Her arm is improving although it still remains swollen and bruised.  Given her age, comorbidities, and the severity of the hematoma that was present, this may take 6 to 8 weeks to fully recover.  We will see her back in about a month to reassess her arm.  Type 2 diabetes mellitus with hyperlipidemia (HCC) blood glucose control important in reducing the progression of atherosclerotic disease. Also, involved in wound healing. On appropriate medications.   Chronic kidney disease Was not using this fistula and not currently on dialysis, but her renal function is poor.  If she needs a new access we may have to place a catheter and we can begin assessing her other arm for an access in the future if nephrology desires.    Leotis Pain, MD  04/02/2021 10:23 AM    This note was created with Dragon medical transcription system.  Any errors from dictation are purely unintentional

## 2021-04-02 NOTE — Assessment & Plan Note (Signed)
Her arm is improving although it still remains swollen and bruised.  Given her age, comorbidities, and the severity of the hematoma that was present, this may take 6 to 8 weeks to fully recover.  We will see her back in about a month to reassess her arm.

## 2021-04-02 NOTE — Assessment & Plan Note (Signed)
Was not using this fistula and not currently on dialysis, but her renal function is poor.  If she needs a new access we may have to place a catheter and we can begin assessing her other arm for an access in the future if nephrology desires.

## 2021-04-02 NOTE — Assessment & Plan Note (Signed)
blood glucose control important in reducing the progression of atherosclerotic disease. Also, involved in wound healing. On appropriate medications.  

## 2021-04-05 DIAGNOSIS — G629 Polyneuropathy, unspecified: Secondary | ICD-10-CM | POA: Diagnosis not present

## 2021-04-05 DIAGNOSIS — J449 Chronic obstructive pulmonary disease, unspecified: Secondary | ICD-10-CM | POA: Diagnosis not present

## 2021-04-05 DIAGNOSIS — Z736 Limitation of activities due to disability: Secondary | ICD-10-CM | POA: Diagnosis not present

## 2021-04-05 DIAGNOSIS — E039 Hypothyroidism, unspecified: Secondary | ICD-10-CM | POA: Diagnosis not present

## 2021-04-05 DIAGNOSIS — E785 Hyperlipidemia, unspecified: Secondary | ICD-10-CM | POA: Diagnosis not present

## 2021-04-05 DIAGNOSIS — R52 Pain, unspecified: Secondary | ICD-10-CM | POA: Diagnosis not present

## 2021-04-05 DIAGNOSIS — R2681 Unsteadiness on feet: Secondary | ICD-10-CM | POA: Diagnosis not present

## 2021-04-05 DIAGNOSIS — R1311 Dysphagia, oral phase: Secondary | ICD-10-CM | POA: Diagnosis not present

## 2021-04-05 DIAGNOSIS — G4700 Insomnia, unspecified: Secondary | ICD-10-CM | POA: Diagnosis not present

## 2021-04-05 DIAGNOSIS — K219 Gastro-esophageal reflux disease without esophagitis: Secondary | ICD-10-CM | POA: Diagnosis not present

## 2021-04-05 DIAGNOSIS — E569 Vitamin deficiency, unspecified: Secondary | ICD-10-CM | POA: Diagnosis not present

## 2021-04-05 DIAGNOSIS — M1000 Idiopathic gout, unspecified site: Secondary | ICD-10-CM | POA: Diagnosis not present

## 2021-04-05 DIAGNOSIS — I10 Essential (primary) hypertension: Secondary | ICD-10-CM | POA: Diagnosis not present

## 2021-04-05 DIAGNOSIS — F32A Depression, unspecified: Secondary | ICD-10-CM | POA: Diagnosis not present

## 2021-04-05 DIAGNOSIS — M6281 Muscle weakness (generalized): Secondary | ICD-10-CM | POA: Diagnosis not present

## 2021-04-05 DIAGNOSIS — H04123 Dry eye syndrome of bilateral lacrimal glands: Secondary | ICD-10-CM | POA: Diagnosis not present

## 2021-04-06 DIAGNOSIS — M6281 Muscle weakness (generalized): Secondary | ICD-10-CM | POA: Diagnosis not present

## 2021-04-06 DIAGNOSIS — K219 Gastro-esophageal reflux disease without esophagitis: Secondary | ICD-10-CM | POA: Diagnosis not present

## 2021-04-06 DIAGNOSIS — J449 Chronic obstructive pulmonary disease, unspecified: Secondary | ICD-10-CM | POA: Diagnosis not present

## 2021-04-06 DIAGNOSIS — F32A Depression, unspecified: Secondary | ICD-10-CM | POA: Diagnosis not present

## 2021-04-06 DIAGNOSIS — E569 Vitamin deficiency, unspecified: Secondary | ICD-10-CM | POA: Diagnosis not present

## 2021-04-06 DIAGNOSIS — E039 Hypothyroidism, unspecified: Secondary | ICD-10-CM | POA: Diagnosis not present

## 2021-04-06 DIAGNOSIS — M1000 Idiopathic gout, unspecified site: Secondary | ICD-10-CM | POA: Diagnosis not present

## 2021-04-06 DIAGNOSIS — R1311 Dysphagia, oral phase: Secondary | ICD-10-CM | POA: Diagnosis not present

## 2021-04-06 DIAGNOSIS — R2681 Unsteadiness on feet: Secondary | ICD-10-CM | POA: Diagnosis not present

## 2021-04-06 DIAGNOSIS — R52 Pain, unspecified: Secondary | ICD-10-CM | POA: Diagnosis not present

## 2021-04-06 DIAGNOSIS — E785 Hyperlipidemia, unspecified: Secondary | ICD-10-CM | POA: Diagnosis not present

## 2021-04-06 DIAGNOSIS — H04123 Dry eye syndrome of bilateral lacrimal glands: Secondary | ICD-10-CM | POA: Diagnosis not present

## 2021-04-06 DIAGNOSIS — Z736 Limitation of activities due to disability: Secondary | ICD-10-CM | POA: Diagnosis not present

## 2021-04-06 DIAGNOSIS — I10 Essential (primary) hypertension: Secondary | ICD-10-CM | POA: Diagnosis not present

## 2021-04-07 DIAGNOSIS — H04123 Dry eye syndrome of bilateral lacrimal glands: Secondary | ICD-10-CM | POA: Diagnosis not present

## 2021-04-07 DIAGNOSIS — M1000 Idiopathic gout, unspecified site: Secondary | ICD-10-CM | POA: Diagnosis not present

## 2021-04-07 DIAGNOSIS — Z736 Limitation of activities due to disability: Secondary | ICD-10-CM | POA: Diagnosis not present

## 2021-04-07 DIAGNOSIS — E785 Hyperlipidemia, unspecified: Secondary | ICD-10-CM | POA: Diagnosis not present

## 2021-04-07 DIAGNOSIS — R52 Pain, unspecified: Secondary | ICD-10-CM | POA: Diagnosis not present

## 2021-04-07 DIAGNOSIS — M6281 Muscle weakness (generalized): Secondary | ICD-10-CM | POA: Diagnosis not present

## 2021-04-07 DIAGNOSIS — E569 Vitamin deficiency, unspecified: Secondary | ICD-10-CM | POA: Diagnosis not present

## 2021-04-07 DIAGNOSIS — R1311 Dysphagia, oral phase: Secondary | ICD-10-CM | POA: Diagnosis not present

## 2021-04-07 DIAGNOSIS — J449 Chronic obstructive pulmonary disease, unspecified: Secondary | ICD-10-CM | POA: Diagnosis not present

## 2021-04-07 DIAGNOSIS — K219 Gastro-esophageal reflux disease without esophagitis: Secondary | ICD-10-CM | POA: Diagnosis not present

## 2021-04-07 DIAGNOSIS — I10 Essential (primary) hypertension: Secondary | ICD-10-CM | POA: Diagnosis not present

## 2021-04-07 DIAGNOSIS — R2681 Unsteadiness on feet: Secondary | ICD-10-CM | POA: Diagnosis not present

## 2021-04-07 DIAGNOSIS — E039 Hypothyroidism, unspecified: Secondary | ICD-10-CM | POA: Diagnosis not present

## 2021-04-07 DIAGNOSIS — F32A Depression, unspecified: Secondary | ICD-10-CM | POA: Diagnosis not present

## 2021-04-09 DIAGNOSIS — S40021D Contusion of right upper arm, subsequent encounter: Secondary | ICD-10-CM | POA: Diagnosis not present

## 2021-04-09 DIAGNOSIS — I10 Essential (primary) hypertension: Secondary | ICD-10-CM | POA: Diagnosis not present

## 2021-04-09 DIAGNOSIS — J309 Allergic rhinitis, unspecified: Secondary | ICD-10-CM | POA: Diagnosis not present

## 2021-04-09 DIAGNOSIS — N184 Chronic kidney disease, stage 4 (severe): Secondary | ICD-10-CM | POA: Diagnosis not present

## 2021-04-14 ENCOUNTER — Other Ambulatory Visit: Payer: Self-pay

## 2021-04-14 ENCOUNTER — Non-Acute Institutional Stay: Payer: Medicare Other | Admitting: Primary Care

## 2021-04-14 DIAGNOSIS — Z515 Encounter for palliative care: Secondary | ICD-10-CM

## 2021-04-14 DIAGNOSIS — R531 Weakness: Secondary | ICD-10-CM | POA: Diagnosis not present

## 2021-04-14 DIAGNOSIS — I10 Essential (primary) hypertension: Secondary | ICD-10-CM | POA: Diagnosis not present

## 2021-04-14 DIAGNOSIS — R4189 Other symptoms and signs involving cognitive functions and awareness: Secondary | ICD-10-CM

## 2021-04-14 NOTE — Progress Notes (Signed)
[facility] Consult Note Telephone: [phone]  Fax: [phone]    Date of encounter: 04/14/21 PATIENT NAME: Denise [name] [address] [place] [address]   [phone] (home)  DOB: [birth year] MRN: [id number] PRIMARY CARE PROVIDER:    Rica Koyanagi, MD [address] [place],  [place] [address] [phone]  REFERRING PROVIDER:   Rica Koyanagi, MD [address] [place],  [place] [address] [phone]   RESPONSIBLE PARTY:    Contact Information     Name Relation Home Work Mobile   [place] Daughter   [phone]        I met face to face with patient in [facility]. Palliative Care was asked to follow this patient by consultation request of  Rica Koyanagi, MD to address advance care planning and complex medical decision making. This is the initial visit.                                     ASSESSMENT AND PLAN / RECOMMENDATIONS:   Advance Care Planning/Goals of Care: Goals include to maximize quality of life and symptom management. Our advance care planning conversation included a discussion about:    Exploration of personal, cultural or spiritual beliefs that might influence medical decisions  Identification  of a healthcare agent -Clarice Pole Review a of an  advance directive document- MOST with full scope CODE STATUS: FULL CODE  Symptom Management/Plan:   I met with patient in her nursing home room meeting her for the first time. She has moderate cognitive impairment. She was able to recount some history including a failed and malfunctioning AV fistula in her right arm, now resolved but has been quite problematic. She states she does plan to be in [facility]. She states her daughter is working on closing up her house and getting rid of her items since she has moved. Patient would like to speak with social work about some concerns specifically some dietary preferences and other requests  she's made.   I reached out to her daughter's number but did not reach her and left a message. Patient thinks her daughters number may have recently changed so I will double check with the facility if I have the correct number.    Follow up Palliative Care Visit: Palliative care will continue to follow for complex medical decision making, advance care planning, and clarification of goals. Return 4-6 weeks or prn.  I spent 35 minutes providing this consultation. More than 50% of the time in this consultation was spent in counseling and care coordination.  PPS: 30%  HOSPICE ELIGIBILITY/DIAGNOSIS: TBD  Chief Complaint: debility  HISTORY OF PRESENT ILLNESS:  Denise [name] is a 87 y.o. year old female  with DM, immobility, dementia, debility. Presents today for palliative assessment.   History obtained from review of EMR, discussion with primary team, and interview with family, facility staff/caregiver and/or Ms. Austria.   I reviewed available labs, medications, imaging, studies and related documents from the EMR.  Records reviewed and summarized above.   ROS  General: NAD EYES: denies vision changes, has glasses ENMT: denies dysphagia Cardiovascular: denies chest pain, denies DOE Pulmonary: endorses cough, denies increased SOB Abdomen: endorses good appetite, denies constipation, endorses continence of bowel GU: denies dysuria, endorses continence of urine MSK:  endorses weakness,  no falls reported, uses cane Skin: denies rashes or wounds Neurological: endorses  back pain, denies insomnia Psych: Endorses discouraged mood Heme/lymph/immuno: denies bruises, abnormal bleeding  Physical Exam: Current and past weights: 180 lbs Constitutional: NAD General: frail appearing, obese  EYES: anicteric sclera, lids intact, no discharge  ENMT: slight Hard of hearing, oral mucous membranes moist, dentition intact CV: S1S2, no LE edema Pulmonary: LCTA, no increased work of breathing, no cough,  room air Abdomen: intake 50%, BS 4 quadrants +, no ascites, A1c= 7% GU: deferred MSK: mild sarcopenia, moves all extremities, ambulatory with cane per report Skin: warm and dry, no rashes or wounds on visible skin Neuro:  +generalized weakness,  mod cognitive impairment Psych: slight anxious affect, A and O x 2-3 Hem/lymph/immuno: no widespread bruising   CURRENT PROBLEM LIST:  Patient Active Problem List   Diagnosis Date Noted   Insomnia    Acute kidney injury superimposed on CKD (HCC)    Acute sinusitis    Weakness    Left arm swelling    Intramural hematoma of artery of right upper extremity 03/12/2021   Hematoma    Sinus pause 11/30/2019   PAF (paroxysmal atrial fibrillation) ([place]) 11/30/2019   Atrial fibrillation with RVR ([place]) 11/28/2019   ESRD on dialysis ([place]) 04/11/2017   Hyperlipidemia 01/05/2017   Type 2 diabetes mellitus with hyperlipidemia ([place]) 01/05/2017   Chronic kidney disease 01/05/2017   Intertrochanteric fracture of right hip ([place]) 08/03/2015   Hypoglycemia secondary to sulfonylurea 07/29/2015   PAST MEDICAL HISTORY:  Active Ambulatory Problems    Diagnosis Date Noted   Hypoglycemia secondary to sulfonylurea 07/29/2015   Intertrochanteric fracture of right hip ([place]) 08/03/2015   Hyperlipidemia 01/05/2017   Type 2 diabetes mellitus with hyperlipidemia ([place]) 01/05/2017   Chronic kidney disease 01/05/2017   ESRD on dialysis ([place]) 04/11/2017   Atrial fibrillation with RVR (HCC) 11/28/2019   Sinus pause 11/30/2019   PAF (paroxysmal atrial fibrillation) ([facility]) 11/30/2019   Intramural hematoma of artery of right upper extremity 03/12/2021   Hematoma    Left arm swelling    Insomnia    Acute kidney injury superimposed on CKD (HCC)    Acute sinusitis    Weakness    Resolved Ambulatory Problems    Diagnosis Date Noted   Paroxysmal atrial fibrillation ([place] [facility]) 11/29/2019   Past Medical History:  Diagnosis Date   A-fib ([facility])    Arthritis    Cataract     COPD (chronic obstructive pulmonary disease) ([facility])    Diabetes mellitus without complication (HCC)    Emphysema of lung (HCC)    GERD (gastroesophageal reflux disease)    Hypertension    Hypothyroidism    SOCIAL HX:  Social History   Tobacco Use   Smoking status: Former    Pack years: 0.00    Types: Cigarettes    Quit date: 07/28/2013    Years since quitting: 7.7   Smokeless tobacco: Never  Substance Use Topics   Alcohol use: No   FAMILY HX:  Family History  Problem Relation Age of Onset   Diabetes Mother    Heart attack Mother    Stomach cancer Father    Colon cancer Sister    Diabetes Sister    Heart attack Brother    Colon cancer Brother    Breast cancer Neg Hx       ALLERGIES:  Allergies  Allergen Reactions   Iodinated Diagnostic Agents Anaphylaxis, Other (See Comments) and Rash   Benzocaine-Chloroxylenol-Hc     Years ago   Benzonatate Rash    Note: pruritis  PERTINENT MEDICATIONS:  Outpatient Encounter Medications as of 04/14/2021  Medication Sig   acetaminophen (TYLENOL) 325 MG tablet Take 1-2 tablets (325-650 mg total) by mouth every 4 (four) hours as needed for mild pain (temp > 101.5).   allopurinol (ZYLOPRIM) 100 MG tablet Take 100 mg by mouth every evening.   ascorbic acid (VITAMIN C) 250 MG tablet Take 250 mg by mouth daily after lunch.   atorvastatin (LIPITOR) 40 MG tablet Take 40 mg by mouth every morning.   feeding supplement (ENSURE ENLIVE / ENSURE PLUS) LIQD Take 237 mLs by mouth 2 (two) times daily between meals.   fluticasone (FLONASE) 50 MCG/ACT nasal spray Place 2 sprays into both nostrils daily.   furosemide (LASIX) 40 MG tablet Take 40 mg by mouth every morning.   insulin aspart (NOVOLOG) 100 UNIT/ML injection 3 units with meals if eats   Iron-FA-B Cmp-C-Biot-Probiotic (FUSION PLUS) CAPS Take 1 capsule by mouth daily.   levothyroxine (SYNTHROID) 100 MCG tablet Take 100 mcg by mouth daily before breakfast.    metoprolol succinate  (TOPROL-XL) 25 MG 24 hr tablet Take 1 tablet (25 mg total) by mouth daily with breakfast.   Multiple Vitamin (MULTIVITAMIN WITH MINERALS) TABS tablet Take 1 tablet by mouth daily.   nystatin cream (MYCOSTATIN) Apply 1 application topically 2 (two) times daily. MIX WITH HYDROCORTISONE   pantoprazole (PROTONIX) 40 MG tablet Take 40 mg by mouth every morning.   polyethylene glycol (MIRALAX / GLYCOLAX) 17 g packet Take 17 g by mouth daily.   tiotropium (SPIRIVA HANDIHALER) 18 MCG inhalation capsule Place 1 capsule into inhaler and inhale daily after lunch.   traZODone (DESYREL) 50 MG tablet Take 1 tablet (50 mg total) by mouth at bedtime.   valsartan (DIOVAN) 80 MG tablet Take 80 mg by mouth every morning.   White Petrolatum-Mineral Oil (SYSTANE NIGHTTIME) OINT Place 1 application into both eyes at bedtime as needed for dry eyes.   No facility-administered encounter medications on file as of 04/14/2021.    Thank you for the opportunity to participate in the care of Ms. Ruberg.  The palliative care team will continue to follow. Please call our office at [phone] if we can be of additional assistance.   Jason Coop, NP , DNP, MPH, AGPCNP-BC, ACHPN  COVID-19 PATIENT SCREENING TOOL Asked and negative response unless otherwise noted:   Have you had symptoms of covid, tested positive or been in contact with someone with symptoms/positive test in the past 5-10 days?

## 2021-04-15 DIAGNOSIS — S40021D Contusion of right upper arm, subsequent encounter: Secondary | ICD-10-CM | POA: Diagnosis not present

## 2021-04-15 DIAGNOSIS — I10 Essential (primary) hypertension: Secondary | ICD-10-CM | POA: Diagnosis not present

## 2021-04-15 DIAGNOSIS — E119 Type 2 diabetes mellitus without complications: Secondary | ICD-10-CM | POA: Diagnosis not present

## 2021-04-15 DIAGNOSIS — J309 Allergic rhinitis, unspecified: Secondary | ICD-10-CM | POA: Diagnosis not present

## 2021-04-15 DIAGNOSIS — H1132 Conjunctival hemorrhage, left eye: Secondary | ICD-10-CM | POA: Diagnosis not present

## 2021-04-15 DIAGNOSIS — N184 Chronic kidney disease, stage 4 (severe): Secondary | ICD-10-CM | POA: Diagnosis not present

## 2021-04-20 DIAGNOSIS — E039 Hypothyroidism, unspecified: Secondary | ICD-10-CM | POA: Diagnosis not present

## 2021-04-20 DIAGNOSIS — N184 Chronic kidney disease, stage 4 (severe): Secondary | ICD-10-CM | POA: Diagnosis not present

## 2021-04-20 DIAGNOSIS — I480 Paroxysmal atrial fibrillation: Secondary | ICD-10-CM | POA: Diagnosis not present

## 2021-04-20 DIAGNOSIS — E119 Type 2 diabetes mellitus without complications: Secondary | ICD-10-CM | POA: Diagnosis not present

## 2021-04-20 DIAGNOSIS — E569 Vitamin deficiency, unspecified: Secondary | ICD-10-CM | POA: Diagnosis not present

## 2021-04-20 DIAGNOSIS — J309 Allergic rhinitis, unspecified: Secondary | ICD-10-CM | POA: Diagnosis not present

## 2021-04-20 DIAGNOSIS — I10 Essential (primary) hypertension: Secondary | ICD-10-CM | POA: Diagnosis not present

## 2021-04-20 DIAGNOSIS — J449 Chronic obstructive pulmonary disease, unspecified: Secondary | ICD-10-CM | POA: Diagnosis not present

## 2021-04-22 DIAGNOSIS — E1165 Type 2 diabetes mellitus with hyperglycemia: Secondary | ICD-10-CM | POA: Diagnosis not present

## 2021-04-30 ENCOUNTER — Ambulatory Visit (INDEPENDENT_AMBULATORY_CARE_PROVIDER_SITE_OTHER): Payer: Medicare Other | Admitting: Nurse Practitioner

## 2021-05-03 ENCOUNTER — Encounter (INDEPENDENT_AMBULATORY_CARE_PROVIDER_SITE_OTHER): Payer: Self-pay | Admitting: Nurse Practitioner

## 2021-05-12 DIAGNOSIS — I10 Essential (primary) hypertension: Secondary | ICD-10-CM | POA: Diagnosis not present

## 2021-05-12 DIAGNOSIS — E119 Type 2 diabetes mellitus without complications: Secondary | ICD-10-CM | POA: Diagnosis not present

## 2021-05-12 DIAGNOSIS — N184 Chronic kidney disease, stage 4 (severe): Secondary | ICD-10-CM | POA: Diagnosis not present

## 2021-05-20 DIAGNOSIS — J449 Chronic obstructive pulmonary disease, unspecified: Secondary | ICD-10-CM | POA: Diagnosis not present

## 2021-05-20 DIAGNOSIS — G4700 Insomnia, unspecified: Secondary | ICD-10-CM | POA: Diagnosis not present

## 2021-05-20 DIAGNOSIS — M6281 Muscle weakness (generalized): Secondary | ICD-10-CM | POA: Diagnosis not present

## 2021-05-20 DIAGNOSIS — E119 Type 2 diabetes mellitus without complications: Secondary | ICD-10-CM | POA: Diagnosis not present

## 2021-05-20 DIAGNOSIS — R2681 Unsteadiness on feet: Secondary | ICD-10-CM | POA: Diagnosis not present

## 2021-05-20 DIAGNOSIS — Z736 Limitation of activities due to disability: Secondary | ICD-10-CM | POA: Diagnosis not present

## 2021-05-20 DIAGNOSIS — E569 Vitamin deficiency, unspecified: Secondary | ICD-10-CM | POA: Diagnosis not present

## 2021-05-26 DIAGNOSIS — I10 Essential (primary) hypertension: Secondary | ICD-10-CM | POA: Diagnosis not present

## 2021-05-26 DIAGNOSIS — E0789 Other specified disorders of thyroid: Secondary | ICD-10-CM | POA: Diagnosis not present

## 2021-05-28 DIAGNOSIS — E1165 Type 2 diabetes mellitus with hyperglycemia: Secondary | ICD-10-CM | POA: Diagnosis not present

## 2021-05-31 DIAGNOSIS — N179 Acute kidney failure, unspecified: Secondary | ICD-10-CM | POA: Diagnosis not present

## 2021-05-31 DIAGNOSIS — E119 Type 2 diabetes mellitus without complications: Secondary | ICD-10-CM | POA: Diagnosis not present

## 2021-05-31 DIAGNOSIS — E785 Hyperlipidemia, unspecified: Secondary | ICD-10-CM | POA: Diagnosis not present

## 2021-05-31 DIAGNOSIS — E039 Hypothyroidism, unspecified: Secondary | ICD-10-CM | POA: Diagnosis not present

## 2021-06-03 DIAGNOSIS — I10 Essential (primary) hypertension: Secondary | ICD-10-CM | POA: Diagnosis not present

## 2021-06-04 ENCOUNTER — Other Ambulatory Visit: Payer: Self-pay

## 2021-06-04 ENCOUNTER — Non-Acute Institutional Stay: Payer: Medicare Other | Admitting: Primary Care

## 2021-06-04 DIAGNOSIS — W19XXXA Unspecified fall, initial encounter: Secondary | ICD-10-CM | POA: Diagnosis not present

## 2021-06-04 DIAGNOSIS — N179 Acute kidney failure, unspecified: Secondary | ICD-10-CM | POA: Diagnosis not present

## 2021-06-09 ENCOUNTER — Non-Acute Institutional Stay: Payer: Medicare Other | Admitting: Primary Care

## 2021-06-09 ENCOUNTER — Other Ambulatory Visit: Payer: Self-pay

## 2021-06-09 DIAGNOSIS — N189 Chronic kidney disease, unspecified: Secondary | ICD-10-CM

## 2021-06-09 DIAGNOSIS — R4189 Other symptoms and signs involving cognitive functions and awareness: Secondary | ICD-10-CM

## 2021-06-09 DIAGNOSIS — W19XXXA Unspecified fall, initial encounter: Secondary | ICD-10-CM | POA: Diagnosis not present

## 2021-06-09 DIAGNOSIS — E1169 Type 2 diabetes mellitus with other specified complication: Secondary | ICD-10-CM

## 2021-06-09 DIAGNOSIS — Z515 Encounter for palliative care: Secondary | ICD-10-CM | POA: Diagnosis not present

## 2021-06-09 DIAGNOSIS — M25511 Pain in right shoulder: Secondary | ICD-10-CM | POA: Diagnosis not present

## 2021-06-09 DIAGNOSIS — M79621 Pain in right upper arm: Secondary | ICD-10-CM | POA: Diagnosis not present

## 2021-06-09 DIAGNOSIS — M25551 Pain in right hip: Secondary | ICD-10-CM | POA: Diagnosis not present

## 2021-06-09 DIAGNOSIS — R531 Weakness: Secondary | ICD-10-CM | POA: Diagnosis not present

## 2021-06-09 DIAGNOSIS — M79651 Pain in right thigh: Secondary | ICD-10-CM | POA: Diagnosis not present

## 2021-06-09 DIAGNOSIS — N179 Acute kidney failure, unspecified: Secondary | ICD-10-CM | POA: Diagnosis not present

## 2021-06-09 DIAGNOSIS — M79604 Pain in right leg: Secondary | ICD-10-CM | POA: Diagnosis not present

## 2021-06-09 NOTE — Progress Notes (Signed)
[facility] Consult Note Telephone: [phone]  Fax: [phone]    Date of encounter: 06/09/21 PATIENT NAME: Denise [name] [address] [place] [address]   [phone] (home)  DOB: April 22, 1930 MRN: [id number] PRIMARY CARE PROVIDER:    Rica Koyanagi, MD,  [place] [address] [phone]  REFERRING PROVIDER:   Rica Koyanagi, MD [address] [place],  [place] [address] [phone]  RESPONSIBLE PARTY:    Contact Information     Name Relation Home Work Mobile   [place] Daughter   [phone]       I met face to face with patient in the [facility]. Palliative Care was asked to follow this patient by consultation request of  Rica Koyanagi, MD to address advance care planning and complex medical decision making. This is a follow up visit.                                   ASSESSMENT AND PLAN / RECOMMENDATIONS:   Advance Care Planning/Goals of Care: Goals include to maximize quality of life and symptom management. Our advance care planning conversation included a discussion about:     Exploration of goals of care in the event of a sudden injury or illness  CODE STATUS: FULL CODE, full scope  Symptom Management/Plan:  I met with patient in her nursing home room. She states a poor appetite. I invited her to meet with the dietary department if she was having trouble with the food. She states she's not able to get snacks or have visits from her family because her daughter got her car repossessed.  S Her weight is stable at 172 lbs and staff reports intake at 75-100% of meal trays.  She stated her daughter also has health issues and is not able to visit much. Staff endorses that she has not had good PO intake and has gotten  dehydrated, has a low GFR and is getting fluids via clysis However she is angry about this and has  taken the needle out several times.   She does not have any  dysphasia or decreased alertness and I recommend to encourage Po intake. And she may benefit from an anti-depressant as she does voice extreme anger and frustration at many things, irritability with staff and services.  Follow up Palliative Care Visit: Palliative care will continue to follow for complex medical decision making, advance care planning, and clarification of goals. Return 8-10 weeks or prn.  I spent 25 minutes providing this consultation. More than 50% of the time in this consultation was spent in counseling and care coordination.  PPS: 40%  HOSPICE ELIGIBILITY/DIAGNOSIS: TBD  Chief Complaint: anorexia  HISTORY OF PRESENT ILLNESS:  Denise [name] is a 87 y.o. year old female  with DM, dementia, obesity, COPD.  Endorses poor appetite and intake. Recently dehydrated with GFR lower.   History obtained from review of EMR, discussion with primary team, and interview with family, facility staff/caregiver and/or Ms. Sawyers.  I reviewed available labs, medications, imaging, studies and related documents from the EMR.  Records reviewed and summarized above.   ROS   General: NAD EYES: denies vision , has glasses ENMT: denies dysphagia Cardiovascular: denies chest pain, denies DOE Pulmonary: denies cough, denies increased SOB Abdomen: endorses good appetite, denies constipation, endorses continence of bowel GU: denies dysuria, endorses continence of urine MSK:  denies weakness,  several  falls reported Skin: denies rashes or wounds Neurological:  endorses  pain, denies insomnia Psych: Endorses  frustrated  mood Heme/lymph/immuno: denies bruises, abnormal bleeding  Physical Exam: Current and past weights: 173 lbs Constitutional: NAD General: frail appearing, obese  EYES: anicteric sclera, lids intact, no discharge  ENMT: intact hearing, oral mucous membranes moist, dentition intact CV: S1S2, RRR, no LE edema Pulmonary: LCTA, no increased work of breathing, no cough Abdomen:  intake 100%, normo-active BS + 4 quadrants, soft and non tender, no ascites GU: deferred MSK: mild  sarcopenia, moves all extremities, ambulatory with cane Skin: warm and dry, no rashes or wounds on visible skin Neuro:  mild  generalized weakness,  mild  cognitive impairment Psych: anxious affect, A and O x 2-3 Hem/lymph/immuno: no widespread bruising   Thank you for the opportunity to participate in the care of Ms. Marchitto.  The palliative care team will continue to follow. Please call our office at [phone] if we can be of additional assistance.   Jason Coop, NP   COVID-19 PATIENT SCREENING TOOL Asked and negative response unless otherwise noted:   Have you had symptoms of covid, tested positive or been in contact with someone with symptoms/positive test in the past 5-10 days?

## 2021-06-10 DIAGNOSIS — W19XXXD Unspecified fall, subsequent encounter: Secondary | ICD-10-CM | POA: Diagnosis not present

## 2021-06-10 DIAGNOSIS — N179 Acute kidney failure, unspecified: Secondary | ICD-10-CM | POA: Diagnosis not present

## 2021-06-10 DIAGNOSIS — M6281 Muscle weakness (generalized): Secondary | ICD-10-CM | POA: Diagnosis not present

## 2021-06-15 DIAGNOSIS — I10 Essential (primary) hypertension: Secondary | ICD-10-CM | POA: Diagnosis not present

## 2021-06-15 DIAGNOSIS — M1000 Idiopathic gout, unspecified site: Secondary | ICD-10-CM | POA: Diagnosis not present

## 2021-06-15 DIAGNOSIS — E569 Vitamin deficiency, unspecified: Secondary | ICD-10-CM | POA: Diagnosis not present

## 2021-06-15 DIAGNOSIS — K219 Gastro-esophageal reflux disease without esophagitis: Secondary | ICD-10-CM | POA: Diagnosis not present

## 2021-06-15 DIAGNOSIS — F32A Depression, unspecified: Secondary | ICD-10-CM | POA: Diagnosis not present

## 2021-06-15 DIAGNOSIS — H04123 Dry eye syndrome of bilateral lacrimal glands: Secondary | ICD-10-CM | POA: Diagnosis not present

## 2021-06-15 DIAGNOSIS — S45991D Other specified injury of unspecified blood vessel at shoulder and upper arm level, right arm, subsequent encounter: Secondary | ICD-10-CM | POA: Diagnosis not present

## 2021-06-15 DIAGNOSIS — E039 Hypothyroidism, unspecified: Secondary | ICD-10-CM | POA: Diagnosis not present

## 2021-06-15 DIAGNOSIS — J449 Chronic obstructive pulmonary disease, unspecified: Secondary | ICD-10-CM | POA: Diagnosis not present

## 2021-06-15 DIAGNOSIS — R2681 Unsteadiness on feet: Secondary | ICD-10-CM | POA: Diagnosis not present

## 2021-06-15 DIAGNOSIS — R52 Pain, unspecified: Secondary | ICD-10-CM | POA: Diagnosis not present

## 2021-06-15 DIAGNOSIS — E785 Hyperlipidemia, unspecified: Secondary | ICD-10-CM | POA: Diagnosis not present

## 2021-06-22 DIAGNOSIS — R2681 Unsteadiness on feet: Secondary | ICD-10-CM | POA: Diagnosis not present

## 2021-06-22 DIAGNOSIS — E1122 Type 2 diabetes mellitus with diabetic chronic kidney disease: Secondary | ICD-10-CM | POA: Diagnosis not present

## 2021-06-22 DIAGNOSIS — E1121 Type 2 diabetes mellitus with diabetic nephropathy: Secondary | ICD-10-CM | POA: Diagnosis not present

## 2021-06-22 DIAGNOSIS — J449 Chronic obstructive pulmonary disease, unspecified: Secondary | ICD-10-CM | POA: Diagnosis not present

## 2021-06-22 DIAGNOSIS — G4700 Insomnia, unspecified: Secondary | ICD-10-CM | POA: Diagnosis not present

## 2021-06-22 DIAGNOSIS — Z736 Limitation of activities due to disability: Secondary | ICD-10-CM | POA: Diagnosis not present

## 2021-06-22 DIAGNOSIS — I130 Hypertensive heart and chronic kidney disease with heart failure and stage 1 through stage 4 chronic kidney disease, or unspecified chronic kidney disease: Secondary | ICD-10-CM | POA: Diagnosis not present

## 2021-06-22 DIAGNOSIS — E1351 Other specified diabetes mellitus with diabetic peripheral angiopathy without gangrene: Secondary | ICD-10-CM | POA: Diagnosis not present

## 2021-06-22 DIAGNOSIS — E1151 Type 2 diabetes mellitus with diabetic peripheral angiopathy without gangrene: Secondary | ICD-10-CM | POA: Diagnosis not present

## 2021-06-22 DIAGNOSIS — E039 Hypothyroidism, unspecified: Secondary | ICD-10-CM | POA: Diagnosis not present

## 2021-06-22 DIAGNOSIS — E785 Hyperlipidemia, unspecified: Secondary | ICD-10-CM | POA: Diagnosis not present

## 2021-06-23 DIAGNOSIS — E559 Vitamin D deficiency, unspecified: Secondary | ICD-10-CM | POA: Diagnosis not present

## 2021-06-23 DIAGNOSIS — I10 Essential (primary) hypertension: Secondary | ICD-10-CM | POA: Diagnosis not present

## 2021-06-24 DIAGNOSIS — I130 Hypertensive heart and chronic kidney disease with heart failure and stage 1 through stage 4 chronic kidney disease, or unspecified chronic kidney disease: Secondary | ICD-10-CM | POA: Diagnosis not present

## 2021-06-24 DIAGNOSIS — N179 Acute kidney failure, unspecified: Secondary | ICD-10-CM | POA: Diagnosis not present

## 2021-06-24 DIAGNOSIS — E1122 Type 2 diabetes mellitus with diabetic chronic kidney disease: Secondary | ICD-10-CM | POA: Diagnosis not present

## 2021-06-24 DIAGNOSIS — E569 Vitamin deficiency, unspecified: Secondary | ICD-10-CM | POA: Diagnosis not present

## 2021-06-26 DIAGNOSIS — I10 Essential (primary) hypertension: Secondary | ICD-10-CM | POA: Diagnosis not present

## 2021-06-29 DIAGNOSIS — E1129 Type 2 diabetes mellitus with other diabetic kidney complication: Secondary | ICD-10-CM | POA: Diagnosis not present

## 2021-06-29 DIAGNOSIS — E1122 Type 2 diabetes mellitus with diabetic chronic kidney disease: Secondary | ICD-10-CM | POA: Diagnosis not present

## 2021-06-29 DIAGNOSIS — I130 Hypertensive heart and chronic kidney disease with heart failure and stage 1 through stage 4 chronic kidney disease, or unspecified chronic kidney disease: Secondary | ICD-10-CM | POA: Diagnosis not present

## 2021-06-29 DIAGNOSIS — R600 Localized edema: Secondary | ICD-10-CM | POA: Diagnosis not present

## 2021-06-29 DIAGNOSIS — N179 Acute kidney failure, unspecified: Secondary | ICD-10-CM | POA: Diagnosis not present

## 2021-06-29 DIAGNOSIS — H6123 Impacted cerumen, bilateral: Secondary | ICD-10-CM | POA: Diagnosis not present

## 2021-06-29 DIAGNOSIS — N184 Chronic kidney disease, stage 4 (severe): Secondary | ICD-10-CM | POA: Diagnosis not present

## 2021-06-29 DIAGNOSIS — I10 Essential (primary) hypertension: Secondary | ICD-10-CM | POA: Diagnosis not present

## 2021-06-30 ENCOUNTER — Non-Acute Institutional Stay: Payer: Medicare Other | Admitting: Primary Care

## 2021-06-30 ENCOUNTER — Other Ambulatory Visit: Payer: Self-pay

## 2021-06-30 ENCOUNTER — Encounter: Payer: Self-pay | Admitting: Primary Care

## 2021-06-30 DIAGNOSIS — E1169 Type 2 diabetes mellitus with other specified complication: Secondary | ICD-10-CM | POA: Diagnosis not present

## 2021-06-30 DIAGNOSIS — Z515 Encounter for palliative care: Secondary | ICD-10-CM

## 2021-06-30 DIAGNOSIS — R4189 Other symptoms and signs involving cognitive functions and awareness: Secondary | ICD-10-CM

## 2021-06-30 DIAGNOSIS — E785 Hyperlipidemia, unspecified: Secondary | ICD-10-CM | POA: Diagnosis not present

## 2021-06-30 DIAGNOSIS — N189 Chronic kidney disease, unspecified: Secondary | ICD-10-CM

## 2021-06-30 NOTE — Progress Notes (Signed)
[facility] Consult Note Telephone: [phone]  Fax: [phone]    Date of encounter: 06/30/21 5:24 PM PATIENT NAME: Denise [name] [address] [place] [address]   [phone] (home)  DOB: 09/29/1930 MRN: [id number] PRIMARY CARE PROVIDER:    Rica Koyanagi, MD,  [place] [address] [phone]  REFERRING PROVIDER:   Rica Koyanagi, MD [address] [place],  [place] [address] [phone]  RESPONSIBLE PARTY:    Contact Information     Name Relation Home Work Mobile   [place] Daughter   [phone]        I [facility]. Palliative Care was asked to follow this patient by consultation request of  Rica Koyanagi, MD to address advance care planning and complex medical decision making. This is a follow up visit.                                   ASSESSMENT AND PLAN / RECOMMENDATIONS:   Advance Care Planning/Goals of Care: Goals include to maximize quality of life and symptom management. Our advance care planning conversation included a discussion about:    The value and importance of advance care planning  Exploration of personal, cultural or spiritual beliefs that might influence medical decisions  Exploration of goals of care in the event of a sudden injury or illness  Identification of a healthcare agent = Daughter but they are not getting along well currently. This may need to be addressed RE her wishes for making health decisions. Review of an  advance directive document - Began discussion RE her worsening health. I will review with her after speaking with nephrology.  CODE STATUS: FULL CODE  Symptom Management/Plan:  Anorexia: Endorses, has lost 11 lbs  (6%) in 3 months, albumin 3.4.  She eats some meals, but not others depending on what is served. Could be a function of worsening renal function. Recommend  nephology dietary supplements for  calories.  Renal function: This has been trending down with creatinine climbing to 2.5-3 recently and GFR putting her in ESRD CKD 5 range, < 15 which is per SNF lab. I have reached out to nephrology to discuss possible HD as she has a functional graft. She had it created in 2017 but never started HD to my review of the record. I will revisit possible treatment options with patient.   Pain: endorse pain from OA, all joints. Is in bed most of day, encouraged to be OoB and get some activity. Recommend 500 mg acetaminophen q 8 h po and monitor daily dosing to no more than 2.5 -3 gm. I would advocate changing pain medication to non combination so that we can dose acetaminophen more freely.  Follow up Palliative Care Visit: Palliative care will continue to follow for complex medical decision making, advance care planning, and clarification of goals. Return 4 weeks or prn.  I spent 35 minutes providing this consultation. More than 50% of the time in this consultation was spent in counseling and care coordination.  PPS: 40%  HOSPICE ELIGIBILITY/DIAGNOSIS: TBD  Chief Complaint: debility  HISTORY OF PRESENT ILLNESS:  Denise [name] is a 87 y.o. year old female  with decreasing renal function, now ESRD with gfr at < 15, creatinine 2.6-3. Has a fistula that was created in 2017 and recently saw nephrology. New lower creatinine  levels have been tracking at SNF however.  She endorses anorexia and poor appetite and did not eat lunch but other meals she does eat.  History obtained from review of EMR, discussion with primary team, and interview with family, facility staff/caregiver and/or Ms. Radigan.  I reviewed available labs, medications, imaging, studies and related documents from the EMR.  Records reviewed and summarized above.   ROS  General: NAD EYES: denies vision changes, has glasses  ENMT: denies dysphagia Cardiovascular: denies chest pain, denies DOE Pulmonary: denies cough, denies increased  SOB Abdomen: endorses good appetite, denies constipation, endorses incontinence of bowel at times  GU: denies dysuria, endorses incontinence of urine at times  MSK:  endorses weakness,  no falls reported Skin: denies rashes or wounds Neurological:  endorses  pain, denies insomnia Psych: Endorses depressed mood Heme/lymph/immuno: denies bruises, abnormal bleeding  Physical Exam: Current and past weights: 173 lbs Constitutional: NAD General: frail appearing EYES: anicteric sclera, lids intact, no discharge  ENMT: intact hearing, oral mucous membranes moist, dentition intact CV: no LE edema Pulmonary:  no increased work of breathing, no cough, room air Abdomen: intake 75%, no ascites GU: deferred MSK: min sarcopenia, moves all extremities, non ambulatory Skin: warm and dry, no rashes or wounds on visible skin Neuro:  + generalized weakness,  mild  cognitive impairment Psych: non-anxious affect, A and O x 3 Hem/lymph/immuno: no widespread bruising   Thank you for the opportunity to participate in the care of Denise [name].  The palliative care team will continue to follow. Please call our office at [phone] if we can be of additional assistance.   Jason Coop, NP   COVID-19 PATIENT SCREENING TOOL Asked and negative response unless otherwise noted:   Have you had symptoms of covid, tested positive or been in contact with someone with symptoms/positive test in the past 5-10 days?

## 2021-07-01 DIAGNOSIS — D649 Anemia, unspecified: Secondary | ICD-10-CM | POA: Diagnosis not present

## 2021-07-07 DIAGNOSIS — E1122 Type 2 diabetes mellitus with diabetic chronic kidney disease: Secondary | ICD-10-CM | POA: Diagnosis not present

## 2021-07-07 DIAGNOSIS — I130 Hypertensive heart and chronic kidney disease with heart failure and stage 1 through stage 4 chronic kidney disease, or unspecified chronic kidney disease: Secondary | ICD-10-CM | POA: Diagnosis not present

## 2021-07-07 DIAGNOSIS — N179 Acute kidney failure, unspecified: Secondary | ICD-10-CM | POA: Diagnosis not present

## 2021-07-13 DIAGNOSIS — R2681 Unsteadiness on feet: Secondary | ICD-10-CM | POA: Diagnosis not present

## 2021-07-13 DIAGNOSIS — E1351 Other specified diabetes mellitus with diabetic peripheral angiopathy without gangrene: Secondary | ICD-10-CM | POA: Diagnosis not present

## 2021-07-13 DIAGNOSIS — E569 Vitamin deficiency, unspecified: Secondary | ICD-10-CM | POA: Diagnosis not present

## 2021-07-13 DIAGNOSIS — G4700 Insomnia, unspecified: Secondary | ICD-10-CM | POA: Diagnosis not present

## 2021-07-13 DIAGNOSIS — E1121 Type 2 diabetes mellitus with diabetic nephropathy: Secondary | ICD-10-CM | POA: Diagnosis not present

## 2021-07-13 DIAGNOSIS — E785 Hyperlipidemia, unspecified: Secondary | ICD-10-CM | POA: Diagnosis not present

## 2021-07-13 DIAGNOSIS — I130 Hypertensive heart and chronic kidney disease with heart failure and stage 1 through stage 4 chronic kidney disease, or unspecified chronic kidney disease: Secondary | ICD-10-CM | POA: Diagnosis not present

## 2021-07-13 DIAGNOSIS — E1122 Type 2 diabetes mellitus with diabetic chronic kidney disease: Secondary | ICD-10-CM | POA: Diagnosis not present

## 2021-07-13 DIAGNOSIS — J449 Chronic obstructive pulmonary disease, unspecified: Secondary | ICD-10-CM | POA: Diagnosis not present

## 2021-07-13 DIAGNOSIS — E1151 Type 2 diabetes mellitus with diabetic peripheral angiopathy without gangrene: Secondary | ICD-10-CM | POA: Diagnosis not present

## 2021-07-13 DIAGNOSIS — E039 Hypothyroidism, unspecified: Secondary | ICD-10-CM | POA: Diagnosis not present

## 2021-07-19 ENCOUNTER — Observation Stay
Admission: EM | Admit: 2021-07-19 | Discharge: 2021-07-21 | Disposition: A | Discharge status: Home / Self Care | Payer: Medicare Other | Attending: Emergency Medicine | Admitting: Emergency Medicine

## 2021-07-19 ENCOUNTER — Emergency Department: Payer: Medicare Other

## 2021-07-19 ENCOUNTER — Other Ambulatory Visit: Payer: Self-pay

## 2021-07-19 DIAGNOSIS — Z794 Long term (current) use of insulin: Secondary | ICD-10-CM | POA: Insufficient documentation

## 2021-07-19 DIAGNOSIS — M7989 Other specified soft tissue disorders: Secondary | ICD-10-CM | POA: Diagnosis not present

## 2021-07-19 DIAGNOSIS — I10 Essential (primary) hypertension: Secondary | ICD-10-CM | POA: Diagnosis not present

## 2021-07-19 DIAGNOSIS — S0990XA Unspecified injury of head, initial encounter: Secondary | ICD-10-CM | POA: Insufficient documentation

## 2021-07-19 DIAGNOSIS — W1839XA Other fall on same level, initial encounter: Secondary | ICD-10-CM | POA: Insufficient documentation

## 2021-07-19 DIAGNOSIS — S32810A Multiple fractures of pelvis with stable disruption of pelvic ring, initial encounter for closed fracture: Secondary | ICD-10-CM | POA: Diagnosis not present

## 2021-07-19 DIAGNOSIS — S32512A Fracture of superior rim of left pubis, initial encounter for closed fracture: Secondary | ICD-10-CM | POA: Diagnosis not present

## 2021-07-19 DIAGNOSIS — M4312 Spondylolisthesis, cervical region: Secondary | ICD-10-CM | POA: Diagnosis not present

## 2021-07-19 DIAGNOSIS — N189 Chronic kidney disease, unspecified: Secondary | ICD-10-CM | POA: Diagnosis present

## 2021-07-19 DIAGNOSIS — Z96641 Presence of right artificial hip joint: Secondary | ICD-10-CM | POA: Diagnosis not present

## 2021-07-19 DIAGNOSIS — R531 Weakness: Secondary | ICD-10-CM | POA: Diagnosis not present

## 2021-07-19 DIAGNOSIS — M25512 Pain in left shoulder: Secondary | ICD-10-CM | POA: Diagnosis not present

## 2021-07-19 DIAGNOSIS — E1122 Type 2 diabetes mellitus with diabetic chronic kidney disease: Secondary | ICD-10-CM | POA: Insufficient documentation

## 2021-07-19 DIAGNOSIS — Z043 Encounter for examination and observation following other accident: Secondary | ICD-10-CM | POA: Diagnosis not present

## 2021-07-19 DIAGNOSIS — D631 Anemia in chronic kidney disease: Secondary | ICD-10-CM | POA: Insufficient documentation

## 2021-07-19 DIAGNOSIS — Z950 Presence of cardiac pacemaker: Secondary | ICD-10-CM | POA: Insufficient documentation

## 2021-07-19 DIAGNOSIS — I480 Paroxysmal atrial fibrillation: Secondary | ICD-10-CM | POA: Diagnosis not present

## 2021-07-19 DIAGNOSIS — I517 Cardiomegaly: Secondary | ICD-10-CM | POA: Diagnosis not present

## 2021-07-19 DIAGNOSIS — M47812 Spondylosis without myelopathy or radiculopathy, cervical region: Secondary | ICD-10-CM | POA: Diagnosis not present

## 2021-07-19 DIAGNOSIS — S329XXA Fracture of unspecified parts of lumbosacral spine and pelvis, initial encounter for closed fracture: Secondary | ICD-10-CM | POA: Insufficient documentation

## 2021-07-19 DIAGNOSIS — E785 Hyperlipidemia, unspecified: Secondary | ICD-10-CM | POA: Diagnosis not present

## 2021-07-19 DIAGNOSIS — S32592A Other specified fracture of left pubis, initial encounter for closed fracture: Secondary | ICD-10-CM | POA: Diagnosis not present

## 2021-07-19 DIAGNOSIS — N186 End stage renal disease: Secondary | ICD-10-CM | POA: Diagnosis not present

## 2021-07-19 DIAGNOSIS — E1169 Type 2 diabetes mellitus with other specified complication: Secondary | ICD-10-CM | POA: Diagnosis present

## 2021-07-19 DIAGNOSIS — S199XXA Unspecified injury of neck, initial encounter: Secondary | ICD-10-CM | POA: Diagnosis not present

## 2021-07-19 DIAGNOSIS — Z79899 Other long term (current) drug therapy: Secondary | ICD-10-CM | POA: Insufficient documentation

## 2021-07-19 DIAGNOSIS — J984 Other disorders of lung: Secondary | ICD-10-CM | POA: Diagnosis not present

## 2021-07-19 DIAGNOSIS — I120 Hypertensive chronic kidney disease with stage 5 chronic kidney disease or end stage renal disease: Secondary | ICD-10-CM | POA: Insufficient documentation

## 2021-07-19 DIAGNOSIS — Z20822 Contact with and (suspected) exposure to covid-19: Secondary | ICD-10-CM | POA: Insufficient documentation

## 2021-07-19 DIAGNOSIS — M25552 Pain in left hip: Secondary | ICD-10-CM | POA: Diagnosis not present

## 2021-07-19 DIAGNOSIS — M25559 Pain in unspecified hip: Secondary | ICD-10-CM | POA: Diagnosis not present

## 2021-07-19 DIAGNOSIS — W19XXXA Unspecified fall, initial encounter: Secondary | ICD-10-CM

## 2021-07-19 DIAGNOSIS — S32302A Unspecified fracture of left ilium, initial encounter for closed fracture: Secondary | ICD-10-CM | POA: Diagnosis not present

## 2021-07-19 DIAGNOSIS — Z743 Need for continuous supervision: Secondary | ICD-10-CM | POA: Diagnosis not present

## 2021-07-19 DIAGNOSIS — I6782 Cerebral ischemia: Secondary | ICD-10-CM | POA: Diagnosis not present

## 2021-07-19 DIAGNOSIS — N184 Chronic kidney disease, stage 4 (severe): Secondary | ICD-10-CM

## 2021-07-19 DIAGNOSIS — G319 Degenerative disease of nervous system, unspecified: Secondary | ICD-10-CM | POA: Diagnosis not present

## 2021-07-19 DIAGNOSIS — E039 Hypothyroidism, unspecified: Secondary | ICD-10-CM | POA: Diagnosis not present

## 2021-07-19 DIAGNOSIS — S79912A Unspecified injury of left hip, initial encounter: Secondary | ICD-10-CM | POA: Diagnosis present

## 2021-07-19 DIAGNOSIS — R5381 Other malaise: Secondary | ICD-10-CM | POA: Diagnosis not present

## 2021-07-19 DIAGNOSIS — W1789XA Other fall from one level to another, initial encounter: Secondary | ICD-10-CM | POA: Diagnosis not present

## 2021-07-19 DIAGNOSIS — I4891 Unspecified atrial fibrillation: Secondary | ICD-10-CM | POA: Diagnosis not present

## 2021-07-19 LAB — BASIC METABOLIC PANEL
Anion gap: 7 (ref 5–15)
BUN: 24 mg/dL — ABNORMAL HIGH (ref 8–23)
CO2: 24 mmol/L (ref 22–32)
Calcium: 8.5 mg/dL — ABNORMAL LOW (ref 8.9–10.3)
Chloride: 109 mmol/L (ref 98–111)
Creatinine, Ser: 2.08 mg/dL — ABNORMAL HIGH (ref 0.44–1.00)
GFR, Estimated: 23 mL/min — ABNORMAL LOW (ref >60)
Glucose, Bld: 209 mg/dL — ABNORMAL HIGH (ref 70–99)
Potassium: 4 mmol/L (ref 3.5–5.1)
Sodium: 140 mmol/L (ref 135–145)

## 2021-07-19 LAB — CBC
HCT: 32.2 % — ABNORMAL LOW (ref 36.0–46.0)
Hemoglobin: 10.7 g/dL — ABNORMAL LOW (ref 12.0–15.0)
MCH: 29.3 pg (ref 26.0–34.0)
MCHC: 33.2 g/dL (ref 30.0–36.0)
MCV: 88.2 fL (ref 80.0–100.0)
Platelets: 189 10*3/uL (ref 150–400)
RBC: 3.65 MIL/uL — ABNORMAL LOW (ref 3.87–5.11)
RDW: 13.8 % (ref 11.5–15.5)
WBC: 8.5 10*3/uL (ref 4.0–10.5)
nRBC: 0 % (ref 0.0–0.2)

## 2021-07-19 LAB — PROCALCITONIN: Procalcitonin: 0.14 ng/mL

## 2021-07-19 LAB — RESP PANEL BY RT-PCR (FLU A&B, COVID) ARPGX2
Influenza A by PCR: NEGATIVE
Influenza B by PCR: NEGATIVE
SARS Coronavirus 2 by RT PCR: NEGATIVE

## 2021-07-19 LAB — GLUCOSE, CAPILLARY: Glucose-Capillary: 147 mg/dL — ABNORMAL HIGH (ref 70–99)

## 2021-07-19 MED ORDER — INSULIN ASPART 100 UNIT/ML IJ SOLN: 0.0000 [IU] | Freq: Every day | INTRAMUSCULAR | Status: DC

## 2021-07-19 MED ORDER — HYDROCODONE-ACETAMINOPHEN 5-325 MG PO TABS
1.0000 | ORAL_TABLET | Freq: Four times a day (QID) | ORAL | Status: DC | PRN
  Administered 2021-07-19: 1 via ORAL
  Administered 2021-07-20: 2 via ORAL
  Administered 2021-07-21: 1 via ORAL
  Filled 2021-07-19: qty 1
  Filled 2021-07-19: qty 2
  Filled 2021-07-19: qty 1

## 2021-07-19 MED ORDER — MORPHINE SULFATE (PF) 4 MG/ML IV SOLN
4.0000 mg | Freq: Once | INTRAVENOUS | Status: AC
  Administered 2021-07-19: 4 mg via INTRAVENOUS
  Filled 2021-07-19: qty 1

## 2021-07-19 MED ORDER — TRAZODONE HCL 50 MG PO TABS
50.0000 mg | ORAL_TABLET | Freq: Every day | ORAL | Status: DC
  Administered 2021-07-19 – 2021-07-20 (×2): 50 mg via ORAL
  Filled 2021-07-19 (×2): qty 1

## 2021-07-19 MED ORDER — ENSURE ENLIVE PO LIQD
237.0000 mL | Freq: Two times a day (BID) | ORAL | Status: DC
  Administered 2021-07-20 – 2021-07-21 (×4): 237 mL via ORAL

## 2021-07-19 MED ORDER — IRBESARTAN 150 MG PO TABS
75.0000 mg | ORAL_TABLET | Freq: Every day | ORAL | Status: DC
  Administered 2021-07-19 – 2021-07-21 (×3): 75 mg via ORAL
  Filled 2021-07-19 (×4): qty 1

## 2021-07-19 MED ORDER — METOPROLOL SUCCINATE ER 25 MG PO TB24
25.0000 mg | ORAL_TABLET | Freq: Every day | ORAL | Status: DC
  Administered 2021-07-20 – 2021-07-21 (×2): 25 mg via ORAL
  Filled 2021-07-19 (×2): qty 1

## 2021-07-19 MED ORDER — FLUTICASONE PROPIONATE 50 MCG/ACT NA SUSP
2.0000 | Freq: Every day | NASAL | Status: DC
  Administered 2021-07-20 – 2021-07-21 (×2): 2 via NASAL
  Filled 2021-07-19: qty 16

## 2021-07-19 MED ORDER — OXYCODONE-ACETAMINOPHEN 5-325 MG PO TABS: 1.0000 | ORAL_TABLET | Freq: Once | ORAL | Status: DC

## 2021-07-19 MED ORDER — PANTOPRAZOLE SODIUM 40 MG PO TBEC
40.0000 mg | DELAYED_RELEASE_TABLET | Freq: Every morning | ORAL | Status: DC
  Administered 2021-07-20 – 2021-07-21 (×2): 40 mg via ORAL
  Filled 2021-07-19 (×2): qty 1

## 2021-07-19 MED ORDER — ONDANSETRON HCL 4 MG/2ML IJ SOLN
4.0000 mg | Freq: Once | INTRAMUSCULAR | Status: AC
Start: 2021-07-19 — End: 2021-07-19
  Administered 2021-07-19: 4 mg via INTRAVENOUS
  Filled 2021-07-19: qty 2

## 2021-07-19 MED ORDER — MORPHINE SULFATE (PF) 2 MG/ML IV SOLN: 0.5000 mg | INTRAVENOUS | Status: DC | PRN

## 2021-07-19 MED ORDER — ATORVASTATIN CALCIUM 20 MG PO TABS
40.0000 mg | ORAL_TABLET | Freq: Every morning | ORAL | Status: DC
  Administered 2021-07-20 – 2021-07-21 (×2): 40 mg via ORAL
  Filled 2021-07-19 (×2): qty 2

## 2021-07-19 MED ORDER — ACETAMINOPHEN 500 MG PO TABS
1000.0000 mg | ORAL_TABLET | Freq: Once | ORAL | Status: AC
  Administered 2021-07-19: 1000 mg via ORAL
  Filled 2021-07-19: qty 2

## 2021-07-19 MED ORDER — SODIUM CHLORIDE 0.9 % IV SOLN: Freq: Once | INTRAVENOUS | Status: AC

## 2021-07-19 MED ORDER — ALLOPURINOL 100 MG PO TABS
100.0000 mg | ORAL_TABLET | Freq: Every evening | ORAL | Status: DC
  Administered 2021-07-19 – 2021-07-20 (×2): 100 mg via ORAL
  Filled 2021-07-19 (×4): qty 1

## 2021-07-19 MED ORDER — ADULT MULTIVITAMIN W/MINERALS CH
1.0000 | ORAL_TABLET | Freq: Every day | ORAL | Status: DC
  Administered 2021-07-20 – 2021-07-21 (×2): 1 via ORAL
  Filled 2021-07-19 (×2): qty 1

## 2021-07-19 MED ORDER — VITAMIN D 25 MCG (1000 UNIT) PO TABS
2000.0000 [IU] | ORAL_TABLET | Freq: Every day | ORAL | Status: DC
  Administered 2021-07-20 – 2021-07-21 (×2): 2000 [IU] via ORAL
  Filled 2021-07-19 (×2): qty 2

## 2021-07-19 MED ORDER — ONDANSETRON HCL 4 MG/2ML IJ SOLN
4.0000 mg | Freq: Once | INTRAMUSCULAR | Status: AC
  Administered 2021-07-19: 4 mg via INTRAVENOUS
  Filled 2021-07-19: qty 2

## 2021-07-19 MED ORDER — INSULIN DETEMIR 100 UNIT/ML ~~LOC~~ SOLN
16.0000 [IU] | Freq: Every day | SUBCUTANEOUS | Status: DC
  Filled 2021-07-19 (×3): qty 0.16

## 2021-07-19 MED ORDER — LEVOTHYROXINE SODIUM 50 MCG PO TABS
100.0000 ug | ORAL_TABLET | Freq: Every day | ORAL | Status: DC
  Administered 2021-07-20 – 2021-07-21 (×2): 100 ug via ORAL
  Filled 2021-07-19 (×2): qty 2

## 2021-07-19 MED ORDER — FENTANYL CITRATE PF 50 MCG/ML IJ SOSY: 12.5000 ug | PREFILLED_SYRINGE | INTRAMUSCULAR | Status: DC | PRN

## 2021-07-19 MED ORDER — VITAMIN D3 25 MCG PO TABS: 2000.0000 [IU] | ORAL_TABLET | Freq: Every day | ORAL | Status: DC

## 2021-07-19 MED ORDER — TIOTROPIUM BROMIDE MONOHYDRATE 18 MCG IN CAPS
1.0000 | ORAL_CAPSULE | Freq: Every day | RESPIRATORY_TRACT | Status: DC
  Administered 2021-07-20 – 2021-07-21 (×2): 18 ug via RESPIRATORY_TRACT
  Filled 2021-07-19: qty 5

## 2021-07-19 MED ORDER — INSULIN ASPART 100 UNIT/ML IJ SOLN
0.0000 [IU] | Freq: Three times a day (TID) | INTRAMUSCULAR | Status: DC
  Administered 2021-07-20: 2 [IU] via SUBCUTANEOUS
  Administered 2021-07-20: 3 [IU] via SUBCUTANEOUS
  Administered 2021-07-21: 2 [IU] via SUBCUTANEOUS
  Filled 2021-07-19 (×3): qty 1

## 2021-07-19 MED ORDER — ASCORBIC ACID 500 MG PO TABS
250.0000 mg | ORAL_TABLET | Freq: Every day | ORAL | Status: DC
  Administered 2021-07-20 – 2021-07-21 (×2): 250 mg via ORAL
  Filled 2021-07-19 (×2): qty 1

## 2021-07-19 MED ORDER — HEPARIN SODIUM (PORCINE) 5000 UNIT/ML IJ SOLN
5000.0000 [IU] | Freq: Three times a day (TID) | INTRAMUSCULAR | Status: DC
  Administered 2021-07-19 – 2021-07-21 (×5): 5000 [IU] via SUBCUTANEOUS
  Filled 2021-07-19 (×5): qty 1

## 2021-07-19 NOTE — ED Notes (Signed)
Pt placed on purewick by this tech and Les, [employer].

## 2021-07-19 NOTE — ED Notes (Signed)
Patients daughter [name]) contacted with an update on the patients admission.

## 2021-07-19 NOTE — H&P (Addendum)
History and Physical   Denise [name] [id number] DOB: 06-Jun-1930 DOA: 07/19/2021  PCP: Rica Koyanagi, MD  Outpatient Specialists: Dr. Candiss Norse, nephrology Patient coming from: [facility]  I have personally briefly reviewed patient's old medical records in [place].  Chief Concern: fall   HPI: Denise [name] is a 87 y.o. female with medical history significant for CKD 4, hypertension, dementia, hyperlipidemia, history of paroxysmal atrial fibrillation, weakness, debility, diabetes mellitus on sliding scale insulin, hypothyroid, protein/calorie malnutrition, presents emergency department for chief concerns of mechanical fall that is unwitnessed.  She reports she was walking to her closet and she was getting her cloths when she fell. She denies lost of consciousness.   At bedside, she is able to me her full name. She is unable to tell me her age, current calendar, or the current president. She does not know her current location. She states she thought she is back at her facility. She states that she is older today than she was yesterday.   She states the pain was 10/10 and is improved now with pain medications. She states the pain is worse with movement and deep palpation. She denies chest pain, abdominal pain, nausea vomiting, diarrhea.  Social history: She was a former tobacco user, 1/2 ppd. She quit a long time ago. She denies current etoh and history of recreational drug use.   Vaccination history: unknown  ROS: Constitutional: no weight change, no fever ENT/Mouth: no sore throat, no rhinorrhea Eyes: no eye pain, no vision changes Cardiovascular: no chest pain, no dyspnea,  no edema, no palpitations Respiratory: no cough, no sputum, no wheezing Gastrointestinal: no nausea, no vomiting, no diarrhea, no constipation Genitourinary: no urinary incontinence, no dysuria, no hematuria Musculoskeletal: no arthralgias, no myalgias, + left hip ain Skin: no skin lesions,  no pruritus, Neuro: + weakness, no loss of consciousness, no syncope Psych: no anxiety, no depression, + decrease appetite Heme/Lymph: no bruising, no bleeding  ED Course: Discussed with emergency medicine provider, patient requiring hospitalization for chief concerns of left superior and left inferior pubic rami fracture requiring pain control.  Vitals in the emergency department was remarkable for temperature of 97.9, respiration rate of 20, heart rate of 68, blood pressure 127/99, SPO2 of 96% on 2 L nasal cannula.  Labs in the emergency department was remarkable for sodium 140, potassium 4.0, chloride 109, bicarb 24, BUN of, serum creatinine of 2.08, nonfasting blood glucose 209, WBC 8.5, hemoglobin 10.7, platelets 189.  COVID/influenza A/influenza B PCR were negative.  In the emergency department patient was acetaminophen 1000 mg p.o., morphine 4 mg IV 2 doses, ondansetron 4 mg IV 2 doses.  ED provider called Dr. Roland Rack, orthopedic surgeon who states that the fracture is nonoperative.  Assessment/Plan  Principal Problem:   Closed fracture of left inferior pubic ramus (HCC) Active Problems:   Hyperlipidemia   Type 2 diabetes mellitus with hyperlipidemia (HCC)   Chronic kidney disease   PAF (paroxysmal atrial fibrillation) (HCC)   Weakness   CKD (chronic kidney disease), stage IV (HCC)   Essential hypertension   # Closed left inferior and superior pubic ramus fracture secondary to mechanical fall - Fall precautions -Apply ice to the area - Admit to MedSurg, observation, no telemetry - Orthopedic surgeon, Dr. Roland Rack has been consulted and states that the fracture is nonoperative  - Pain control: Morphine 0.5 mg IV every 2 hours as needed for severe pain, Norco 5-325 mg, reactive 1 to 2 tablets, p.o., every 6  hours as needed for moderate pain -Placement: Daughter [name] states that she would like for [facility]  # CKD stage IV-currently not on  hemodialysis, status post left AV fistula formation in 2017 - Status post ligation of AV fistula and evacuation of hematoma on 03/14/2021 - Serum creatinine on presentation is 2.08, GFR 23 which is baseline for her  # Hyperlipidemia-atorvastatin 40 mg daily resumed # Hypertension-irbesartan 75 mg daily, metoprolol succinate 25 mg daily with breakfast # Insomnia-trazodone 50 mg nightly # Insulin-dependent diabetes mellitus-resumed long-acting 60 units nightly, insulin SSI with at bedtime coverage ordered # Hypothyroid-levothyroxine 100 mcg daily before breakfast resumed # GERD-PPI  # QT prolongation-avoid QT prolonging agents, monitor  # COPD-long-acting inhalers resumed   Chart reviewed.   DVT prophylaxis: Heparin 5000 units every 8 hours Code Status: Full code Diet: Heart healthy/carb modified Family Communication: Called and updated daughter, [name] over the phone Disposition Plan: Pending clinical course Consults called: Orthopedic Admission status: MedSurg, observation, telemetry  Past Medical History:  Diagnosis Date   A-fib [facility])    Arthritis    Cataract    Chronic kidney disease    COPD (chronic obstructive pulmonary disease) ([place])    Diabetes mellitus without complication ([place])    Emphysema of lung ([place])    GERD (gastroesophageal reflux disease)    Hypertension    Hypothyroidism    Past Surgical History:  Procedure Laterality Date   AV FISTULA PLACEMENT Right 11/18/2015   Procedure: ARTERIOVENOUS (AV) FISTULA CREATION;  Surgeon: Algernon Huxley, MD;  Location: [facility] ORS;  Service: Vascular;  Laterality: Right;   CHOLECYSTECTOMY     GALLBLADDER SURGERY     HIP ARTHROPLASTY Right 07/31/2015   Procedure: ARTHROPLASTY BIPOLAR HIP (HEMIARTHROPLASTY);  Surgeon: Corky Mull, MD;  Location: [facility] ORS;  Service: Orthopedics;  Laterality: Right;   LIGATION OF ARTERIOVENOUS  FISTULA Right 03/14/2021   Procedure: LIGATION OF ARTERIOVENOUS  FISTULA  AND EVACUATION OF HEMATOMA;   Surgeon: Conrad Navarre, MD;  Location: [facility] ORS;  Service: Vascular;  Laterality: Right;   PACEMAKER IMPLANT N/A 11/29/2019   Procedure: PACEMAKER IMPLANT;  Surgeon: Constance Haw, MD;  Location: [place] [facility];  Service: Cardiovascular;  Laterality: N/A;   TEMPORARY PACEMAKER N/A 11/29/2019   Procedure: TEMPORARY PACEMAKER;  Surgeon: Isaias Cowman, MD;  Location: [place] [facility];  Service: Cardiovascular;  Laterality: N/A;   Social History:  reports that she quit smoking about 7 years ago. She has never used smokeless tobacco. She reports that she does not drink alcohol and does not use drugs.  Allergies  Allergen Reactions   Iodinated Diagnostic Agents Anaphylaxis, Other (See Comments) and Rash   Benzocaine-Chloroxylenol-Hc     Years ago   Benzonatate Rash    Note: pruritis   Family History  Problem Relation Age of Onset   Diabetes Mother    Heart attack Mother    Stomach cancer Father    Colon cancer Sister    Diabetes Sister    Heart attack Brother    Colon cancer Brother    Breast cancer Neg Hx    Family history: Family history reviewed and not pertinent  Prior to Admission medications   Medication Sig Start Date End Date Taking? Authorizing Provider  acetaminophen (TYLENOL) 325 MG tablet Take 1-2 tablets (325-650 mg total) by mouth every 4 (four) hours as needed for mild pain (temp > 101.5). 11/29/19   Milus Banister, NP  allopurinol (ZYLOPRIM)  100 MG tablet Take 100 mg by mouth every evening. 11/20/18   [provider]  ascorbic acid (VITAMIN C) 250 MG tablet Take 250 mg by mouth daily after lunch.    [provider]  atorvastatin (LIPITOR) 40 MG tablet Take 40 mg by mouth every morning. 09/08/14   [provider]  feeding supplement (ENSURE ENLIVE / ENSURE PLUS) LIQD Take 237 mLs by mouth 2 (two) times daily between meals. 03/19/21   Loletha Grayer, MD  fluticasone (FLONASE) 50 MCG/ACT nasal spray Place 2 sprays into both  nostrils daily. 03/19/21   Loletha Grayer, MD  furosemide (LASIX) 40 MG tablet Take 40 mg by mouth every morning. 05/11/16   [provider]  insulin aspart (NOVOLOG) 100 UNIT/ML injection 3 units with meals if eats 03/18/21   Loletha Grayer, MD  Iron-FA-B Cmp-C-Biot-Probiotic (FUSION PLUS) CAPS Take 1 capsule by mouth daily.    [provider]  levothyroxine (SYNTHROID) 100 MCG tablet Take 100 mcg by mouth daily before breakfast.  07/16/19   [provider]  metoprolol succinate (TOPROL-XL) 25 MG 24 hr tablet Take 1 tablet (25 mg total) by mouth daily with breakfast. 03/19/21   Loletha Grayer, MD  Multiple Vitamin (MULTIVITAMIN WITH MINERALS) TABS tablet Take 1 tablet by mouth daily. 03/19/21   Loletha Grayer, MD  nystatin cream (MYCOSTATIN) Apply 1 application topically 2 (two) times daily. [place] WITH HYDROCORTISONE 08/26/19   [provider]  pantoprazole (PROTONIX) 40 MG tablet Take 40 mg by mouth every morning. 11/30/15   [provider]  polyethylene glycol (MIRALAX / GLYCOLAX) 17 g packet Take 17 g by mouth daily. 03/19/21   Loletha Grayer, MD  tiotropium (SPIRIVA HANDIHALER) 18 MCG inhalation capsule Place 1 capsule into inhaler and inhale daily after lunch. 01/22/10   [provider]  traZODone (DESYREL) 50 MG tablet Take 1 tablet (50 mg total) by mouth at bedtime. 03/18/21   Loletha Grayer, MD  valsartan (DIOVAN) 80 MG tablet Take 80 mg by mouth every morning. 08/20/14   [provider]  White Petrolatum-Mineral Oil (SYSTANE NIGHTTIME) OINT Place 1 application into both eyes at bedtime as needed for dry eyes. 06/10/19   [provider]   Physical Exam: Vitals:   07/19/21 1314 07/19/21 1315 07/19/21 1445 07/19/21 1553  BP:  (!) 127/99 (!) 157/31 (!) 155/77  Pulse:  68 75 69  Resp:    17  Temp:      TempSrc:      SpO2:  96% 94% 93%  Weight: 76.7 kg     Height: '5\' 6"'$  (1.676 m)      Constitutional: appears  age-appropriate, frail, NAD, calm, comfortable Eyes: PERRL, lids and conjunctivae normal ENMT: Mucous membranes are moist. Posterior pharynx clear of any exudate or lesions. Age-appropriate dentition. Hearing appropriate Neck: normal, supple, no masses, no thyromegaly Respiratory: clear to auscultation bilaterally, no wheezing, no crackles. Normal respiratory effort. No accessory muscle use.  Cardiovascular: Regular rate and rhythm, no murmurs / rubs / gallops. No extremity edema. 2+ pedal pulses. No carotid bruits.  Abdomen: no tenderness, no masses palpated, no hepatosplenomegaly. Bowel sounds positive.  Musculoskeletal: no clubbing / cyanosis. No joint deformity upper and lower extremities. Good ROM, no contractures, no atrophy. Normal muscle tone.  Left hip pain Skin: no rashes, lesions, ulcers. No induration Neurologic: Sensation intact. Strength 5/5 in all 4.  Psychiatric: Poor judgment and insight. Alert and oriented x self. Normal mood.   EKG: independently reviewed, showing sinus rhythm  with rate of 69, QTc 514  Chest x-ray on Admission: I personally reviewed and I agree with radiologist reading as below.  DG Shoulder 1 View Left  Result Date: 07/19/2021 CLINICAL DATA:  Un witnessed fall. EXAM: LEFT SHOULDER COMPARISON:  None. FINDINGS: A single AP radiograph of the shoulder is provided. No acute fracture or gross dislocation is evident, however assessment is limited by lack of orthogonal imaging as well as superimposition of a pacemaker generator over the scapula/inferior glenoid. Mild spurring is noted at the acromioclavicular joint. The soft tissues are unremarkable. IMPRESSION: No acute osseous abnormality identified. Electronically Signed   By: Logan Bores M.D.   On: 07/19/2021 12:23   CT HEAD WO CONTRAST (5MM)  Result Date: 07/19/2021 CLINICAL DATA:  Unwitnessed fall.  Head trauma. EXAM: CT HEAD WITHOUT CONTRAST CT CERVICAL SPINE WITHOUT CONTRAST TECHNIQUE: Multidetector CT  imaging of the head and cervical spine was performed following the standard protocol without intravenous contrast. Multiplanar CT image reconstructions of the cervical spine were also generated. COMPARISON:  11/21/2019 FINDINGS: CT HEAD FINDINGS Brain: No evidence for acute hemorrhage, mass lesion, midline shift, hydrocephalus or large infarct. Mild cerebral atrophy is stable. Diffuse low-density in the white matter is suggestive for chronic changes. Vascular: No hyperdense vessel or unexpected calcification. Skull: Normal. Negative for fracture or focal lesion. Sinuses/Orbits: No acute finding. Other: None. CT CERVICAL SPINE FINDINGS Alignment: Overall alignment is within normal limits. Minimal anterolisthesis of C2 on C3. Skull base and vertebrae: No acute fracture. No primary bone lesion or focal pathologic process. Soft tissues and spinal canal: No prevertebral fluid or swelling. No visible canal hematoma. Disc levels: Multilevel disc space narrowing. Bilateral degenerative facet arthropathy. Upper chest: Negative for pneumothorax. Scarring at lung apices. Few tiny peripheral nodular densities are likely incidental findings and related to scarring. Other: None IMPRESSION: 1. No acute intracranial abnormality. 2. Stable cerebral atrophy and evidence for chronic small vessel ischemic changes. 3. No acute bone abnormality in cervical spine. 4. Multilevel degenerative changes in the cervical spine. Electronically Signed   By: Markus Daft M.D.   On: 07/19/2021 16:54   CT Cervical Spine Wo Contrast  Result Date: 07/19/2021 CLINICAL DATA:  Unwitnessed fall.  Head trauma. EXAM: CT HEAD WITHOUT CONTRAST CT CERVICAL SPINE WITHOUT CONTRAST TECHNIQUE: Multidetector CT imaging of the head and cervical spine was performed following the standard protocol without intravenous contrast. Multiplanar CT image reconstructions of the cervical spine were also generated. COMPARISON:  11/21/2019 FINDINGS: CT HEAD FINDINGS Brain: No  evidence for acute hemorrhage, mass lesion, midline shift, hydrocephalus or large infarct. Mild cerebral atrophy is stable. Diffuse low-density in the white matter is suggestive for chronic changes. Vascular: No hyperdense vessel or unexpected calcification. Skull: Normal. Negative for fracture or focal lesion. Sinuses/Orbits: No acute finding. Other: None. CT CERVICAL SPINE FINDINGS Alignment: Overall alignment is within normal limits. Minimal anterolisthesis of C2 on C3. Skull base and vertebrae: No acute fracture. No primary bone lesion or focal pathologic process. Soft tissues and spinal canal: No prevertebral fluid or swelling. No visible canal hematoma. Disc levels: Multilevel disc space narrowing. Bilateral degenerative facet arthropathy. Upper chest: Negative for pneumothorax. Scarring at lung apices. Few tiny peripheral nodular densities are likely incidental findings and related to scarring. Other: None IMPRESSION: 1. No acute intracranial abnormality. 2. Stable cerebral atrophy and evidence for chronic small vessel ischemic changes. 3. No acute bone abnormality in cervical spine. 4. Multilevel degenerative changes in the cervical spine. Electronically Signed   By: Quita Skye  Anselm Pancoast M.D.   On: 07/19/2021 16:54   CT PELVIS WO CONTRAST  Result Date: 07/19/2021 CLINICAL DATA:  Left hip pain after unwitnessed fall EXAM: CT PELVIS WITHOUT CONTRAST TECHNIQUE: Multidetector CT imaging of the pelvis was performed following the standard protocol without intravenous contrast. COMPARISON:  Radiograph 07/19/2021, CT 07/25/2015 FINDINGS: Urinary Tract: Partially obscured by artifact. No abnormality visualized. Bowel:  Negative for bowel wall thickening. Vascular/Lymphatic: Mild aortic atherosclerosis. No aneurysm. No pelvic adenopathy Reproductive:  Uterus and adnexa are unremarkable Other:  Negative for pelvic effusion. Musculoskeletal: Right hip replacement with artifact. Acute mildly displaced fracture involving left  superior pubic ramus. Acute mildly comminuted and displaced fracture involving left inferior pubic ramus. Pubic symphysis is intact. Additional mildly displaced fracture involving the left iliac bone that appears to extend to the left SI joint. Left SI joint is non widened. Edema and probable small hematoma within the left obturator internus muscle and gluteus minimus muscle IMPRESSION: 1. Acute mildly displaced left superior pubic ramus fracture with acute comminuted and mildly displaced left inferior pubic ramus fracture. 2. Acute minimally displaced fracture involving the left posterior iliac bone with likely extension to left SI joint. No SI joint widening. 3. Soft tissue swelling about the left hip with probable small intramuscular hematoma involving left obturator internus and gluteus minimus muscles. Electronically Signed   By: Donavan Foil M.D.   On: 07/19/2021 16:55   DG Chest Portable 1 View  Result Date: 07/19/2021 CLINICAL DATA:  Fall EXAM: PORTABLE CHEST 1 VIEW COMPARISON:  01/09/2020 FINDINGS: Left-sided pacing device with leads over right atrium and right ventricle. Mild cardiomegaly with vascular congestion. Diffuse interstitial opacity likely due to chronic disease. Negative for pneumothorax. IMPRESSION: 1. Cardiomegaly with vascular congestion. 2. Mild diffuse interstitial opacity felt secondary to chronic disease. Electronically Signed   By: Donavan Foil M.D.   On: 07/19/2021 15:52   DG Hip Unilat With Pelvis 2-3 Views Left  Result Date: 07/19/2021 CLINICAL DATA:  Status post fall.  Left hip pain. EXAM: DG HIP (WITH OR WITHOUT PELVIS) 2-3V LEFT COMPARISON:  07/31/2015 FINDINGS: There are acute fracture deformities involving the superior and inferior left pubic rami. The left hip appears located. No signs of a left hip fracture or dislocation. Previous right hip arthroplasty. IMPRESSION: Acute fracture deformities of the left superior and inferior pubic rami. Electronically Signed   By:  Kerby Moors M.D.   On: 07/19/2021 12:23    Labs on Admission: I have personally reviewed following labs  CBC: Recent Labs  Lab 07/19/21 1235  WBC 8.5  HGB 10.7*  HCT 32.2*  MCV 88.2  PLT 99991111   Basic Metabolic Panel: Recent Labs  Lab 07/19/21 1235  NA 140  K 4.0  CL 109  CO2 24  GLUCOSE 209*  BUN 24*  CREATININE 2.08*  CALCIUM 8.5*   GFR: Estimated Creatinine Clearance: 19.6 mL/min (A) (by C-G formula based on SCr of 2.08 mg/dL (H)).  Urine analysis:    Component Value Date/Time   COLORURINE YELLOW (A) 11/28/2019 1935   APPEARANCEUR CLEAR (A) 11/28/2019 1935   APPEARANCEUR Clear 11/04/2013 0955   LABSPEC 1.016 11/28/2019 1935   LABSPEC 1.015 11/04/2013 0955   PHURINE 5.0 11/28/2019 1935   GLUCOSEU 50 (A) 11/28/2019 1935   GLUCOSEU Negative 11/04/2013 0955   HGBUR NEGATIVE 11/28/2019 1935   BILIRUBINUR NEGATIVE 11/28/2019 1935   BILIRUBINUR Negative 11/04/2013 [place] 11/28/2019 1935   PROTEINUR NEGATIVE 11/28/2019 1935   UROBILINOGEN 1.0 07/24/2015  2130   NITRITE NEGATIVE 11/28/2019 1935   LEUKOCYTESUR NEGATIVE 11/28/2019 1935   LEUKOCYTESUR Negative 11/04/2013 0955   Dr. Tobie Poet [facility]  If 7PM-7AM, please contact overnight-coverage provider If 7AM-7PM, please contact day coverage provider [email or web address]  07/19/2021, 5:47 PM

## 2021-07-19 NOTE — Consult Note (Signed)
ORTHOPAEDIC CONSULTATION  REQUESTING PHYSICIAN: Cox, Amy N, DO  Chief Complaint:   Left hip/groin pain.  History of Present Illness: Denise [name] is a 87 y.o. female with multiple medical problems including COPD, diabetes, chronic kidney disease, atrial fibrillation, hypertension, and hypothyroidism who lives in a skilled nursing facility.  Apparently, she lost her balance and fell while trying to put some clothes away last evening in her room, causing her to injure her left hip.  She was brought to the emergency room where x-rays and a subsequent pelvic CT scan demonstrated the presence of the superior and inferior pubic rami fractures as well as a left iliac wing fracture.  The patient thinks that she may have struck her head during the fall, but did not lose consciousness.  She also denies any lightheadedness, dizziness, chest pain, shortness of breath, or other symptoms which may have contributed to her fall.  Past Medical History:  Diagnosis Date   A-fib [facility])    Arthritis    Cataract    Chronic kidney disease    COPD (chronic obstructive pulmonary disease) ([place])    Diabetes mellitus without complication (HCC)    Emphysema of lung (HCC)    GERD (gastroesophageal reflux disease)    Hypertension    Hypothyroidism    Past Surgical History:  Procedure Laterality Date   AV FISTULA PLACEMENT Right 11/18/2015   Procedure: ARTERIOVENOUS (AV) FISTULA CREATION;  Surgeon: Algernon Huxley, MD;  Location: [facility] ORS;  Service: Vascular;  Laterality: Right;   CHOLECYSTECTOMY     GALLBLADDER SURGERY     HIP ARTHROPLASTY Right 07/31/2015   Procedure: ARTHROPLASTY BIPOLAR HIP (HEMIARTHROPLASTY);  Surgeon: Corky Mull, MD;  Location: [facility] OR[facility];  Service: Orthopedics;  Laterality: Right;   LIGATION OF ARTERIOVENOUS  FISTULA Right 03/14/2021   Procedure: LIGATION OF ARTERIOVENOUS  FISTULA  AND EVACUATION OF HEMATOMA;  Surgeon: Conrad Lodi,  MD;  Location: [facility];  Service: Vascular;  Laterality: Right;   PACEMAKER IMPLANT N/A 11/29/2019   Procedure: PACEMAKER IMPLANT;  Surgeon: Constance Haw, MD;  Location: [place] [facility];  Service: Cardiovascular;  Laterality: N/A;   TEMPORARY PACEMAKER N/A 11/29/2019   Procedure: TEMPORARY PACEMAKER;  Surgeon: Isaias Cowman, MD;  Location: [place] [facility];  Service: Cardiovascular;  Laterality: N/A;   Social History   Socioeconomic History   Marital status: Widowed    Spouse name: Not on file   Number of children: Not on file   Years of education: Not on file   Highest education level: Not on file  Occupational History   Not on file  Tobacco Use   Smoking status: Former    Types: Cigarettes    Quit date: 07/28/2013    Years since quitting: 7.9   Smokeless tobacco: Never  Substance and Sexual Activity   Alcohol use: No   Drug use: No   Sexual activity: Never    Birth control/protection: Abstinence  Other Topics Concern   Not on file  Social History Narrative   Not on file   Social Determinants of Health   Financial Resource Strain: Not on file  Food Insecurity: Not on file  Transportation Needs: Not on file  Physical Activity: Not on file  Stress: Not on file  Social Connections: Not on file   Family History  Problem Relation Age of Onset   Diabetes Mother    Heart attack Mother    Stomach cancer Father    Colon cancer Sister  Diabetes Sister    Heart attack Brother    Colon cancer Brother    Breast cancer Neg Hx    Allergies  Allergen Reactions   Iodinated Diagnostic Agents Anaphylaxis, Other (See Comments) and Rash   Benzocaine-Chloroxylenol-Hc     Years ago   Benzonatate Rash    Note: pruritis   Prior to Admission medications   Medication Sig Start Date End Date Taking? Authorizing Provider  acetaminophen (TYLENOL) 325 MG tablet Take 1-2 tablets (325-650 mg total) by mouth every 4 (four) hours as needed for mild pain (temp > 101.5).  11/29/19  Yes Milus Banister, NP  allopurinol (ZYLOPRIM) 100 MG tablet Take 100 mg by mouth every evening. 11/20/18  Yes [provider]  atorvastatin (LIPITOR) 40 MG tablet Take 40 mg by mouth every morning. 09/08/14  Yes [provider]  cetirizine (ZYRTEC) 5 MG tablet Take 5 mg by mouth daily.   Yes [provider]  fluticasone (FLONASE) 50 MCG/ACT nasal spray Place 2 sprays into both nostrils daily. 03/19/21  Yes Wieting, Richard, MD  hydrocortisone 1 % lotion Apply 1 application topically 2 (two) times daily.   Yes [provider]  insulin detemir (LEVEMIR) 100 UNIT/ML injection Inject 16 Units into the skin at bedtime.   Yes [provider]  insulin lispro (HUMALOG) 100 UNIT/ML injection Inject 0-12 Units into the skin 3 (three) times daily before meals.   Yes [provider]  levothyroxine (SYNTHROID) 100 MCG tablet Take 100 mcg by mouth daily before breakfast.  07/16/19  Yes [provider]  metoprolol succinate (TOPROL-XL) 25 MG 24 hr tablet Take 1 tablet (25 mg total) by mouth daily with breakfast. 03/19/21  Yes Wieting, Richard, MD  Multiple Vitamin (MULTIVITAMIN WITH MINERALS) TABS tablet Take 1 tablet by mouth daily. 03/19/21  Yes Wieting, Richard, MD  oxyCODONE-acetaminophen (PERCOCET/ROXICET) 5-325 MG tablet Take 1 tablet by mouth every 6 (six) hours as needed for severe pain.   Yes [provider]  pantoprazole (PROTONIX) 40 MG tablet Take 40 mg by mouth every morning. 11/30/15  Yes [provider]  polyethylene glycol (MIRALAX / GLYCOLAX) 17 g packet Take 17 g by mouth daily. 03/19/21  Yes Wieting, Richard, MD  tiotropium (SPIRIVA HANDIHALER) 18 MCG inhalation capsule Place 1 capsule into inhaler and inhale daily after lunch. 01/22/10  Yes [provider]  traZODone (DESYREL) 50 MG tablet Take 1 tablet (50 mg total) by mouth at bedtime. 03/18/21  Yes Wieting, Richard, MD  valsartan (DIOVAN) 80 MG tablet Take  80 mg by mouth every morning. 08/20/14  Yes [provider]  Vitamin D3 (VITAMIN D) 25 MCG tablet Take 2,000 Units by mouth daily.   Yes [provider]  ascorbic acid (VITAMIN C) 250 MG tablet Take 250 mg by mouth daily after lunch. Patient not taking: No sig reported    [provider]  feeding supplement (ENSURE ENLIVE / ENSURE PLUS) LIQD Take 237 mLs by mouth 2 (two) times daily between meals. 03/19/21   Loletha Grayer, MD  furosemide (LASIX) 40 MG tablet Take 40 mg by mouth every morning. Patient not taking: No sig reported 05/11/16   [provider]  insulin aspart (NOVOLOG) 100 UNIT/ML injection 3 units with meals if eats 03/18/21   Loletha Grayer, MD  Iron-FA-B Cmp-C-Biot-Probiotic (FUSION PLUS) CAPS Take 1 capsule by mouth daily. Patient not taking: No sig reported    [provider]  nystatin cream (MYCOSTATIN) Apply 1 application topically 2 (two)  times daily. [place] WITH HYDROCORTISONE 08/26/19   [provider]  White Petrolatum-Mineral Oil (SYSTANE NIGHTTIME) OINT Place 1 application into both eyes at bedtime as needed for dry eyes. 06/10/19   [provider]   DG Shoulder 1 View Left  Result Date: 07/19/2021 CLINICAL DATA:  Un witnessed fall. EXAM: LEFT SHOULDER COMPARISON:  None. FINDINGS: A single AP radiograph of the shoulder is provided. No acute fracture or gross dislocation is evident, however assessment is limited by lack of orthogonal imaging as well as superimposition of a pacemaker generator over the scapula/inferior glenoid. Mild spurring is noted at the acromioclavicular joint. The soft tissues are unremarkable. IMPRESSION: No acute osseous abnormality identified. Electronically Signed   By: Logan Bores M.D.   On: 07/19/2021 12:23   CT HEAD WO CONTRAST (5MM)  Result Date: 07/19/2021 CLINICAL DATA:  Unwitnessed fall.  Head trauma. EXAM: CT HEAD WITHOUT CONTRAST CT CERVICAL SPINE WITHOUT CONTRAST TECHNIQUE:  Multidetector CT imaging of the head and cervical spine was performed following the standard protocol without intravenous contrast. Multiplanar CT image reconstructions of the cervical spine were also generated. COMPARISON:  11/21/2019 FINDINGS: CT HEAD FINDINGS Brain: No evidence for acute hemorrhage, mass lesion, midline shift, hydrocephalus or large infarct. Mild cerebral atrophy is stable. Diffuse low-density in the white matter is suggestive for chronic changes. Vascular: No hyperdense vessel or unexpected calcification. Skull: Normal. Negative for fracture or focal lesion. Sinuses/Orbits: No acute finding. Other: None. CT CERVICAL SPINE FINDINGS Alignment: Overall alignment is within normal limits. Minimal anterolisthesis of C2 on C3. Skull base and vertebrae: No acute fracture. No primary bone lesion or focal pathologic process. Soft tissues and spinal canal: No prevertebral fluid or swelling. No visible canal hematoma. Disc levels: Multilevel disc space narrowing. Bilateral degenerative facet arthropathy. Upper chest: Negative for pneumothorax. Scarring at lung apices. Few tiny peripheral nodular densities are likely incidental findings and related to scarring. Other: None IMPRESSION: 1. No acute intracranial abnormality. 2. Stable cerebral atrophy and evidence for chronic small vessel ischemic changes. 3. No acute bone abnormality in cervical spine. 4. Multilevel degenerative changes in the cervical spine. Electronically Signed   By: Markus Daft M.D.   On: 07/19/2021 16:54   CT Cervical Spine Wo Contrast  Result Date: 07/19/2021 CLINICAL DATA:  Unwitnessed fall.  Head trauma. EXAM: CT HEAD WITHOUT CONTRAST CT CERVICAL SPINE WITHOUT CONTRAST TECHNIQUE: Multidetector CT imaging of the head and cervical spine was performed following the standard protocol without intravenous contrast. Multiplanar CT image reconstructions of the cervical spine were also generated. COMPARISON:  11/21/2019 FINDINGS: CT HEAD  FINDINGS Brain: No evidence for acute hemorrhage, mass lesion, midline shift, hydrocephalus or large infarct. Mild cerebral atrophy is stable. Diffuse low-density in the white matter is suggestive for chronic changes. Vascular: No hyperdense vessel or unexpected calcification. Skull: Normal. Negative for fracture or focal lesion. Sinuses/Orbits: No acute finding. Other: None. CT CERVICAL SPINE FINDINGS Alignment: Overall alignment is within normal limits. Minimal anterolisthesis of C2 on C3. Skull base and vertebrae: No acute fracture. No primary bone lesion or focal pathologic process. Soft tissues and spinal canal: No prevertebral fluid or swelling. No visible canal hematoma. Disc levels: Multilevel disc space narrowing. Bilateral degenerative facet arthropathy. Upper chest: Negative for pneumothorax. Scarring at lung apices. Few tiny peripheral nodular densities are likely incidental findings and related to scarring. Other: None IMPRESSION: 1. No acute intracranial abnormality. 2. Stable cerebral atrophy and evidence for chronic small vessel ischemic changes. 3. No acute bone abnormality in  cervical spine. 4. Multilevel degenerative changes in the cervical spine. Electronically Signed   By: Markus Daft M.D.   On: 07/19/2021 16:54   CT PELVIS WO CONTRAST  Result Date: 07/19/2021 CLINICAL DATA:  Left hip pain after unwitnessed fall EXAM: CT PELVIS WITHOUT CONTRAST TECHNIQUE: Multidetector CT imaging of the pelvis was performed following the standard protocol without intravenous contrast. COMPARISON:  Radiograph 07/19/2021, CT 07/25/2015 FINDINGS: Urinary Tract: Partially obscured by artifact. No abnormality visualized. Bowel:  Negative for bowel wall thickening. Vascular/Lymphatic: Mild aortic atherosclerosis. No aneurysm. No pelvic adenopathy Reproductive:  Uterus and adnexa are unremarkable Other:  Negative for pelvic effusion. Musculoskeletal: Right hip replacement with artifact. Acute mildly displaced  fracture involving left superior pubic ramus. Acute mildly comminuted and displaced fracture involving left inferior pubic ramus. Pubic symphysis is intact. Additional mildly displaced fracture involving the left iliac bone that appears to extend to the left SI joint. Left SI joint is non widened. Edema and probable small hematoma within the left obturator internus muscle and gluteus minimus muscle IMPRESSION: 1. Acute mildly displaced left superior pubic ramus fracture with acute comminuted and mildly displaced left inferior pubic ramus fracture. 2. Acute minimally displaced fracture involving the left posterior iliac bone with likely extension to left SI joint. No SI joint widening. 3. Soft tissue swelling about the left hip with probable small intramuscular hematoma involving left obturator internus and gluteus minimus muscles. Electronically Signed   By: Donavan Foil M.D.   On: 07/19/2021 16:55   DG Chest Portable 1 View  Result Date: 07/19/2021 CLINICAL DATA:  Fall EXAM: PORTABLE CHEST 1 VIEW COMPARISON:  01/09/2020 FINDINGS: Left-sided pacing device with leads over right atrium and right ventricle. Mild cardiomegaly with vascular congestion. Diffuse interstitial opacity likely due to chronic disease. Negative for pneumothorax. IMPRESSION: 1. Cardiomegaly with vascular congestion. 2. Mild diffuse interstitial opacity felt secondary to chronic disease. Electronically Signed   By: Donavan Foil M.D.   On: 07/19/2021 15:52   DG Hip Unilat With Pelvis 2-3 Views Left  Result Date: 07/19/2021 CLINICAL DATA:  Status post fall.  Left hip pain. EXAM: DG HIP (WITH OR WITHOUT PELVIS) 2-3V LEFT COMPARISON:  07/31/2015 FINDINGS: There are acute fracture deformities involving the superior and inferior left pubic rami. The left hip appears located. No signs of a left hip fracture or dislocation. Previous right hip arthroplasty. IMPRESSION: Acute fracture deformities of the left superior and inferior pubic rami.  Electronically Signed   By: Kerby Moors M.D.   On: 07/19/2021 12:23    Positive ROS: All other systems have been reviewed and were otherwise negative with the exception of those mentioned in the HPI and as above.  Physical Exam: General:  Alert, no acute distress Psychiatric:  Patient is competent for consent with normal mood and affect   Cardiovascular:  No pedal edema Respiratory:  No wheezing, non-labored breathing GI:  Abdomen is soft and non-tender Skin:  No lesions in the area of chief complaint Neurologic:  Sensation intact distally Lymphatic:  No axillary or cervical lymphadenopathy  Orthopedic Exam:  Orthopedic examination is limited to the left hip and lower extremity.  The left lower extremity is symmetrically aligned with the right lower extremity.  Skin inspection around the left hip is unremarkable.  No swelling, erythema, ecchymosis, abrasions, or other skin abnormalities are identified.  She has mild-moderate tenderness to palpation over the anterior aspect of the hip region, but there is no tenderness laterally.  She is able to tolerate gentle  logrolling of the left lower extremity with only minimal discomfort.  She has more severe pain with any attempted active flexion of the hip.  She is neurovascularly intact to the left lower extremity and foot, demonstrating ability to dorsiflex and plantarflex her toes and ankle.  Sensation intact light touch to all distributions.  She has good capillary refill to her foot.  X-rays:  X-rays of the left hip and pelvis, as well as a CT scan of the pelvis are available for review and have been reviewed by myself.  These films demonstrate mildly displaced acute superior and inferior pubic rami fractures on the left, as well as a minimally displaced left iliac fracture.  The hip joint itself is intact and there is no evidence of any proximal femur fractures.  No significant degenerative changes of the left hip are noted.  Assessment: Mildly  displaced left superior and inferior pubic rami fractures with left iliac fracture.  Plan: The treatment options have been discussed with the patient.  Based on the patient's fracture pattern, I do not feel that surgical intervention is necessary or warranted at this time.  I have explained to her that the severity of her fractures will result in a prolonged recuperative process.  However, she may be mobilized with physical therapy as symptoms permit, preferably partial weightbearing to the left lower extremity.  She is to receive pain medication as deemed medically appropriate.  Thank you for asking me to participate in the care of this most pleasant yet unfortunate woman.  I will be happy to follow her with you.   Pascal Lux, MD  Beeper #:  [phone]  07/19/2021 7:40 PM

## 2021-07-19 NOTE — ED Provider Notes (Signed)
[facility] Emergency Department Provider Note  ____________________________________________   Event Date/Time   First MD Initiated Contact with Patient 07/19/21 1506     (approximate)  I have reviewed the triage vital signs and the nursing notes.   HISTORY  Chief Complaint Fall    HPI Denise [name] is a 87 y.o. female here with fall.  The patient currently lives in a skilled nursing facility.  She states that she had a mechanical fall yesterday, causing her to fall onto her hips.  She did go to bed to rest and states that she has been unable to walk today due to pain.  The pain is aching, throbbing, 10 out of 10, localized in her bilateral hips and pelvic area.  Pain is worse with any kind of movement or attempted weightbearing.  Denies any distal numbness or weakness.  She states she has felt fatigued because she did not sleep well, but otherwise felt well prior to the fall.  Denies any recent urinary symptoms.  No recent cough or cold.  She does have some mild nasal congestion which has been new over the last several days.  She been eating and drinking normally.  No other complaints.  She does believe she hit her head.    Past Medical History:  Diagnosis Date   A-fib [facility])    Arthritis    Cataract    Chronic kidney disease    COPD (chronic obstructive pulmonary disease) ([place])    Diabetes mellitus without complication (HCC)    Emphysema of lung ([place])    GERD (gastroesophageal reflux disease)    Hypertension    Hypothyroidism     Patient Active Problem List   Diagnosis Date Noted   Insomnia    Acute kidney injury superimposed on CKD ([place])    Acute sinusitis    Weakness    Left arm swelling    Intramural hematoma of artery of right upper extremity 03/12/2021   Hematoma    Anemia in chronic kidney disease 05/06/2020   Sinus pause 11/30/2019   PAF (paroxysmal atrial fibrillation) ([place]) 11/30/2019   Atrial fibrillation with RVR ([place]) 11/28/2019    ESRD on dialysis ([place]) 04/11/2017   Hyperlipidemia 01/05/2017   Type 2 diabetes mellitus with hyperlipidemia ([place]) 01/05/2017   Chronic kidney disease 01/05/2017   Intertrochanteric fracture of right hip ([place]) 08/03/2015   Hypoglycemia secondary to sulfonylurea 07/29/2015    Past Surgical History:  Procedure Laterality Date   AV FISTULA PLACEMENT Right 11/18/2015   Procedure: ARTERIOVENOUS (AV) FISTULA CREATION;  Surgeon: Algernon Huxley, MD;  Location: [facility] ORS;  Service: Vascular;  Laterality: Right;   CHOLECYSTECTOMY     GALLBLADDER SURGERY     HIP ARTHROPLASTY Right 07/31/2015   Procedure: ARTHROPLASTY BIPOLAR HIP (HEMIARTHROPLASTY);  Surgeon: Corky Mull, MD;  Location: [facility] ORS;  Service: Orthopedics;  Laterality: Right;   LIGATION OF ARTERIOVENOUS  FISTULA Right 03/14/2021   Procedure: LIGATION OF ARTERIOVENOUS  FISTULA  AND EVACUATION OF HEMATOMA;  Surgeon: Conrad Umapine, MD;  Location: [facility] ORS;  Service: Vascular;  Laterality: Right;   PACEMAKER IMPLANT N/A 11/29/2019   Procedure: PACEMAKER IMPLANT;  Surgeon: Constance Haw, MD;  Location: [place] CV LAB;  Service: Cardiovascular;  Laterality: N/A;   TEMPORARY PACEMAKER N/A 11/29/2019   Procedure: TEMPORARY PACEMAKER;  Surgeon: Isaias Cowman, MD;  Location: [place] CV [facility];  Service: Cardiovascular;  Laterality: N/A;    Prior to Admission medications   Medication Sig  Start Date End Date Taking? Authorizing Provider  acetaminophen (TYLENOL) 325 MG tablet Take 1-2 tablets (325-650 mg total) by mouth every 4 (four) hours as needed for mild pain (temp > 101.5). 11/29/19   Milus Banister, NP  allopurinol (ZYLOPRIM) 100 MG tablet Take 100 mg by mouth every evening. 11/20/18   [provider]  ascorbic acid (VITAMIN C) 250 MG tablet Take 250 mg by mouth daily after lunch.    [provider]  atorvastatin (LIPITOR) 40 MG tablet Take 40 mg by mouth every morning. 09/08/14   [provider]   feeding supplement (ENSURE ENLIVE / ENSURE PLUS) LIQD Take 237 mLs by mouth 2 (two) times daily between meals. 03/19/21   Loletha Grayer, MD  fluticasone (FLONASE) 50 MCG/ACT nasal spray Place 2 sprays into both nostrils daily. 03/19/21   Loletha Grayer, MD  furosemide (LASIX) 40 MG tablet Take 40 mg by mouth every morning. 05/11/16   [provider]  insulin aspart (NOVOLOG) 100 UNIT/ML injection 3 units with meals if eats 03/18/21   Loletha Grayer, MD  Iron-FA-B Cmp-C-Biot-Probiotic (FUSION PLUS) CAPS Take 1 capsule by mouth daily.    [provider]  levothyroxine (SYNTHROID) 100 MCG tablet Take 100 mcg by mouth daily before breakfast.  07/16/19   [provider]  metoprolol succinate (TOPROL-XL) 25 MG 24 hr tablet Take 1 tablet (25 mg total) by mouth daily with breakfast. 03/19/21   Loletha Grayer, MD  Multiple Vitamin (MULTIVITAMIN WITH MINERALS) TABS tablet Take 1 tablet by mouth daily. 03/19/21   Loletha Grayer, MD  nystatin cream (MYCOSTATIN) Apply 1 application topically 2 (two) times daily. [place] WITH HYDROCORTISONE 08/26/19   [provider]  pantoprazole (PROTONIX) 40 MG tablet Take 40 mg by mouth every morning. 11/30/15   [provider]  polyethylene glycol (MIRALAX / GLYCOLAX) 17 g packet Take 17 g by mouth daily. 03/19/21   Loletha Grayer, MD  tiotropium (SPIRIVA HANDIHALER) 18 MCG inhalation capsule Place 1 capsule into inhaler and inhale daily after lunch. 01/22/10   [provider]  traZODone (DESYREL) 50 MG tablet Take 1 tablet (50 mg total) by mouth at bedtime. 03/18/21   Loletha Grayer, MD  valsartan (DIOVAN) 80 MG tablet Take 80 mg by mouth every morning. 08/20/14   [provider]  White Petrolatum-Mineral Oil (SYSTANE NIGHTTIME) OINT Place 1 application into both eyes at bedtime as needed for dry eyes. 06/10/19   [provider]    Allergies Iodinated diagnostic agents, Benzocaine-chloroxylenol-hc, and  Benzonatate  Family History  Problem Relation Age of Onset   Diabetes Mother    Heart attack Mother    Stomach cancer Father    Colon cancer Sister    Diabetes Sister    Heart attack Brother    Colon cancer Brother    Breast cancer Neg Hx     Social History Social History   Tobacco Use   Smoking status: Former    Types: Cigarettes    Quit date: 07/28/2013    Years since quitting: 7.9   Smokeless tobacco: Never  Substance Use Topics   Alcohol use: No   Drug use: No    Review of Systems  Review of Systems  Constitutional:  Positive for fatigue. Negative for fever.  HENT:  Negative for congestion and sore throat.   Eyes:  Negative for visual disturbance.  Respiratory:  Negative for cough and shortness of breath.   Cardiovascular:  Negative for chest pain.  Gastrointestinal:  Negative  for abdominal pain, diarrhea, nausea and vomiting.  Genitourinary:  Negative for flank pain.  Musculoskeletal:  Positive for arthralgias and gait problem. Negative for back pain and neck pain.  Skin:  Negative for rash and wound.  Neurological:  Positive for weakness.    ____________________________________________  PHYSICAL EXAM:      VITAL SIGNS: ED Triage Vitals  Enc Vitals Group     BP 07/19/21 1313 (!) 127/99     Pulse Rate 07/19/21 1313 71     Resp 07/19/21 1313 20     Temp 07/19/21 1313 97.9 F (36.6 C)     Temp Source 07/19/21 1313 Oral     SpO2 07/19/21 1313 98 %     Weight 07/19/21 1314 169 lb (76.7 kg)     Height 07/19/21 1314 '5\' 6"'$  (1.676 m)     Head Circumference --      [facility] --      Pain Score 07/19/21 1102 7     Pain Loc --      Pain Edu? --      Excl. in [place]? --      Physical Exam Vitals and nursing note reviewed.  Constitutional:      General: She is not in acute distress.    Appearance: She is well-developed.  HENT:     Head: Normocephalic and atraumatic.     Comments: No apparent head trauma or bruising. Eyes:     Conjunctiva/sclera:  Conjunctivae normal.  Neck:     Comments: Midline of her stomach tenderness. Cardiovascular:     Rate and Rhythm: Normal rate and regular rhythm.     Heart sounds: Normal heart sounds. No murmur heard.   No friction rub.  Pulmonary:     Effort: Pulmonary effort is normal. No respiratory distress.     Breath sounds: Normal breath sounds. No wheezing or rales.  Abdominal:     General: There is no distension.     Palpations: Abdomen is soft.     Tenderness: There is no abdominal tenderness.  Musculoskeletal:     Cervical back: Neck supple.     Comments: Moderate pelvic tenderness, no overt instability noted.  Tenderness throughout the anterior pelvis bilaterally.  No bruising.  Skin:    General: Skin is warm.     Capillary Refill: Capillary refill takes less than 2 seconds.  Neurological:     Mental Status: She is alert and oriented to person, place, and time.     Motor: No abnormal muscle tone.      ____________________________________________   LABS (all labs ordered are listed, but only abnormal results are displayed)  Labs Reviewed  CBC - Abnormal; Notable for the following components:      Result Value   RBC 3.65 (*)    Hemoglobin 10.7 (*)    HCT 32.2 (*)    All other components within normal limits  BASIC METABOLIC PANEL - Abnormal; Notable for the following components:   Glucose, Bld 209 (*)    BUN 24 (*)    Creatinine, Ser 2.08 (*)    Calcium 8.5 (*)    GFR, Estimated 23 (*)    All other components within normal limits  RESP PANEL BY RT-PCR (FLU A&B, COVID) ARPGX2  URINALYSIS, ROUTINE W REFLEX MICROSCOPIC    ____________________________________________  EKG: Normal sinus rhythm, ventricular rate 69.  PR 158, QRS 86, QTc 514.  No acute ST elevations or depressions indications of acute ischemia report. ________________________________________  RADIOLOGY All  imaging, including plain films, CT scans, and ultrasounds, independently reviewed by me, and  interpretations confirmed via formal radiology reads.  ED MD interpretation:   X-ray shoulder left: Negative X-ray hip left: Superior and inferior pubic ramus fractures.  Official radiology report(s): DG Shoulder 1 View Left  Result Date: 07/19/2021 CLINICAL DATA:  Un witnessed fall. EXAM: LEFT SHOULDER COMPARISON:  None. FINDINGS: A single AP radiograph of the shoulder is provided. No acute fracture or gross dislocation is evident, however assessment is limited by lack of orthogonal imaging as well as superimposition of a pacemaker generator over the scapula/inferior glenoid. Mild spurring is noted at the acromioclavicular joint. The soft tissues are unremarkable. IMPRESSION: No acute osseous abnormality identified. Electronically Signed   By: Logan Bores M.D.   On: 07/19/2021 12:23   CT HEAD WO CONTRAST (5MM)  Result Date: 07/19/2021 CLINICAL DATA:  Unwitnessed fall.  Head trauma. EXAM: CT HEAD WITHOUT CONTRAST CT CERVICAL SPINE WITHOUT CONTRAST TECHNIQUE: Multidetector CT imaging of the head and cervical spine was performed following the standard protocol without intravenous contrast. Multiplanar CT image reconstructions of the cervical spine were also generated. COMPARISON:  11/21/2019 FINDINGS: CT HEAD FINDINGS Brain: No evidence for acute hemorrhage, mass lesion, midline shift, hydrocephalus or large infarct. Mild cerebral atrophy is stable. Diffuse low-density in the white matter is suggestive for chronic changes. Vascular: No hyperdense vessel or unexpected calcification. Skull: Normal. Negative for fracture or focal lesion. Sinuses/Orbits: No acute finding. Other: None. CT CERVICAL SPINE FINDINGS Alignment: Overall alignment is within normal limits. Minimal anterolisthesis of C2 on C3. Skull base and vertebrae: No acute fracture. No primary bone lesion or focal pathologic process. Soft tissues and spinal canal: No prevertebral fluid or swelling. No visible canal hematoma. Disc levels:  Multilevel disc space narrowing. Bilateral degenerative facet arthropathy. Upper chest: Negative for pneumothorax. Scarring at lung apices. Few tiny peripheral nodular densities are likely incidental findings and related to scarring. Other: None IMPRESSION: 1. No acute intracranial abnormality. 2. Stable cerebral atrophy and evidence for chronic small vessel ischemic changes. 3. No acute bone abnormality in cervical spine. 4. Multilevel degenerative changes in the cervical spine. Electronically Signed   By: Markus Daft M.D.   On: 07/19/2021 16:54   CT Cervical Spine Wo Contrast  Result Date: 07/19/2021 CLINICAL DATA:  Unwitnessed fall.  Head trauma. EXAM: CT HEAD WITHOUT CONTRAST CT CERVICAL SPINE WITHOUT CONTRAST TECHNIQUE: Multidetector CT imaging of the head and cervical spine was performed following the standard protocol without intravenous contrast. Multiplanar CT image reconstructions of the cervical spine were also generated. COMPARISON:  11/21/2019 FINDINGS: CT HEAD FINDINGS Brain: No evidence for acute hemorrhage, mass lesion, midline shift, hydrocephalus or large infarct. Mild cerebral atrophy is stable. Diffuse low-density in the white matter is suggestive for chronic changes. Vascular: No hyperdense vessel or unexpected calcification. Skull: Normal. Negative for fracture or focal lesion. Sinuses/Orbits: No acute finding. Other: None. CT CERVICAL SPINE FINDINGS Alignment: Overall alignment is within normal limits. Minimal anterolisthesis of C2 on C3. Skull base and vertebrae: No acute fracture. No primary bone lesion or focal pathologic process. Soft tissues and spinal canal: No prevertebral fluid or swelling. No visible canal hematoma. Disc levels: Multilevel disc space narrowing. Bilateral degenerative facet arthropathy. Upper chest: Negative for pneumothorax. Scarring at lung apices. Few tiny peripheral nodular densities are likely incidental findings and related to scarring. Other: None IMPRESSION:  1. No acute intracranial abnormality. 2. Stable cerebral atrophy and evidence for chronic small vessel ischemic changes. 3. No  acute bone abnormality in cervical spine. 4. Multilevel degenerative changes in the cervical spine. Electronically Signed   By: Markus Daft M.D.   On: 07/19/2021 16:54   CT PELVIS WO CONTRAST  Result Date: 07/19/2021 CLINICAL DATA:  Left hip pain after unwitnessed fall EXAM: CT PELVIS WITHOUT CONTRAST TECHNIQUE: Multidetector CT imaging of the pelvis was performed following the standard protocol without intravenous contrast. COMPARISON:  Radiograph 07/19/2021, CT 07/25/2015 FINDINGS: Urinary Tract: Partially obscured by artifact. No abnormality visualized. Bowel:  Negative for bowel wall thickening. Vascular/Lymphatic: Mild aortic atherosclerosis. No aneurysm. No pelvic adenopathy Reproductive:  Uterus and adnexa are unremarkable Other:  Negative for pelvic effusion. Musculoskeletal: Right hip replacement with artifact. Acute mildly displaced fracture involving left superior pubic ramus. Acute mildly comminuted and displaced fracture involving left inferior pubic ramus. Pubic symphysis is intact. Additional mildly displaced fracture involving the left iliac bone that appears to extend to the left SI joint. Left SI joint is non widened. Edema and probable small hematoma within the left obturator internus muscle and gluteus minimus muscle IMPRESSION: 1. Acute mildly displaced left superior pubic ramus fracture with acute comminuted and mildly displaced left inferior pubic ramus fracture. 2. Acute minimally displaced fracture involving the left posterior iliac bone with likely extension to left SI joint. No SI joint widening. 3. Soft tissue swelling about the left hip with probable small intramuscular hematoma involving left obturator internus and gluteus minimus muscles. Electronically Signed   By: Donavan Foil M.D.   On: 07/19/2021 16:55   DG Chest Portable 1 View  Result Date:  07/19/2021 CLINICAL DATA:  Fall EXAM: PORTABLE CHEST 1 VIEW COMPARISON:  01/09/2020 FINDINGS: Left-sided pacing device with leads over right atrium and right ventricle. Mild cardiomegaly with vascular congestion. Diffuse interstitial opacity likely due to chronic disease. Negative for pneumothorax. IMPRESSION: 1. Cardiomegaly with vascular congestion. 2. Mild diffuse interstitial opacity felt secondary to chronic disease. Electronically Signed   By: Donavan Foil M.D.   On: 07/19/2021 15:52   DG Hip Unilat With Pelvis 2-3 Views Left  Result Date: 07/19/2021 CLINICAL DATA:  Status post fall.  Left hip pain. EXAM: DG HIP (WITH OR WITHOUT PELVIS) 2-3V LEFT COMPARISON:  07/31/2015 FINDINGS: There are acute fracture deformities involving the superior and inferior left pubic rami. The left hip appears located. No signs of a left hip fracture or dislocation. Previous right hip arthroplasty. IMPRESSION: Acute fracture deformities of the left superior and inferior pubic rami. Electronically Signed   By: Kerby Moors M.D.   On: 07/19/2021 12:23    ____________________________________________  PROCEDURES   Procedure(s) performed (including Critical Care):  Procedures  ____________________________________________  INITIAL IMPRESSION / MDM / [place] / ED COURSE  As part of my medical decision making, I reviewed the following data within the [place] notes reviewed and incorporated, Old chart reviewed, Notes from prior ED visits, and [place] Controlled Substance Database       *AGRIPINA MORGANS was evaluated in Emergency Department on 07/19/2021 for the symptoms described in the history of present illness. She was evaluated in the context of the global COVID-19 pandemic, which necessitated consideration that the patient might be at risk for infection with the SARS-CoV-2 virus that causes COVID-19. Institutional protocols and algorithms that pertain to the evaluation of  patients at risk for COVID-19 are in a state of rapid change based on information released by regulatory bodies including the CDC and federal and state organizations. These policies and algorithms  were followed during the patient's care in the ED.  Some ED evaluations and interventions may be delayed as a result of limited staffing during the pandemic.*     Medical Decision Making:  87 yo F here with fall, pelvic pain. Imaging shows displaced pelvic fx. Pt is HDS, otherwise well appearing. CBC, BMP unremarkable. CKD is at baseline. COVID negative. CXR  with cardiomegaly but no focal abnormality. XR Shoulder negative. CTs pending. Will plan to admit for pain control, PT. Pt with persistent pain after IV morphine x 2.   CT scan reviewed, CT head and C-spine negative.  CT of the pelvis reviewed and shows superior pubic ramus fracture, inferior pubic ramus fracture, as well as left posterior iliac extending to the SI joint.  Suspect these will be nonoperative.  She has a soft tissue swelling consistent with hematoma but is hemodynamically stable without evidence to suggest active bleeding.  Will plan to discuss with orthopedics to confirm that this is not up, admit to medicine.  Discussed with Dr. Roland Rack, likely non op. Admit to medicine. ____________________________________________  FINAL CLINICAL IMPRESSION(S) / ED DIAGNOSES  Final diagnoses:  Fall, initial encounter  Closed displaced fracture of pelvis, unspecified part of pelvis, initial encounter ([place])     MEDICATIONS GIVEN DURING THIS VISIT:  Medications  ondansetron (ZOFRAN) injection 4 mg (4 mg Intravenous Given 07/19/21 1233)  morphine 4 MG/ML injection 4 mg (4 mg Intravenous Given 07/19/21 1233)  morphine 4 MG/ML injection 4 mg (4 mg Intravenous Given 07/19/21 1523)  ondansetron (ZOFRAN) injection 4 mg (4 mg Intravenous Given 07/19/21 1523)  acetaminophen (TYLENOL) tablet 1,000 mg (1,000 mg Oral Given 07/19/21 1523)  0.9 %  sodium chloride  infusion ( Intravenous New Bag/Given 07/19/21 1523)     ED Discharge Orders     None        Note:  This document was prepared using Dragon voice recognition software and may include unintentional dictation errors.   Duffy Bruce, MD 07/19/21 [phone]

## 2021-07-19 NOTE — ED Triage Notes (Signed)
Pt come with c/o unwitnessed fall. Pt is from [facility]. Pt states left hip pain. No LOC, hitting head or blood thinners. Per EMS pt's left shoulder was out of place and is not back.

## 2021-07-19 NOTE — ED Provider Notes (Signed)
Emergency Medicine Provider Triage Evaluation Note  Denise [name] , a 87 y.o. female  was evaluated in triage.  Pt complains of left hip & shoulder pain after unwitnessed mechanical fall. She slipped in her room and slid down the wall, according to her recollection. She denies LOC, CP, NV, dizziness.   Review of Systems  Positive: Left hip & shoulder pain Negative: LOC, CP  Physical Exam  There were no vitals taken for this visit. Gen:   Awake, no distress  NAD Resp:  Normal effort CTA MSK:   Moves extremities without difficulty  Other:  CVS: normal distal pulses  Medical Decision Making  Medically screening exam initiated at 11:33 AM.  Appropriate orders placed.  Denise [name] was informed that the remainder of the evaluation will be completed by another provider, this initial triage assessment does not replace that evaluation, and the importance of remaining in the ED until their evaluation is complete.  Patient with ED evaluation of injuries from a mechanical fall at her SNF.   Melvenia Needles, PA-C 07/19/21 1137    Rada Hay, MD 07/19/21 [phone]

## 2021-07-20 DIAGNOSIS — N184 Chronic kidney disease, stage 4 (severe): Secondary | ICD-10-CM | POA: Diagnosis not present

## 2021-07-20 DIAGNOSIS — N189 Chronic kidney disease, unspecified: Secondary | ICD-10-CM

## 2021-07-20 DIAGNOSIS — S32810A Multiple fractures of pelvis with stable disruption of pelvic ring, initial encounter for closed fracture: Secondary | ICD-10-CM | POA: Diagnosis not present

## 2021-07-20 DIAGNOSIS — S32592A Other specified fracture of left pubis, initial encounter for closed fracture: Secondary | ICD-10-CM | POA: Diagnosis not present

## 2021-07-20 DIAGNOSIS — Z515 Encounter for palliative care: Secondary | ICD-10-CM | POA: Diagnosis not present

## 2021-07-20 DIAGNOSIS — I10 Essential (primary) hypertension: Secondary | ICD-10-CM | POA: Diagnosis not present

## 2021-07-20 LAB — GLUCOSE, CAPILLARY
Glucose-Capillary: 122 mg/dL — ABNORMAL HIGH (ref 70–99)
Glucose-Capillary: 173 mg/dL — ABNORMAL HIGH (ref 70–99)
Glucose-Capillary: 109 mg/dL — ABNORMAL HIGH (ref 70–99)
Glucose-Capillary: 79 mg/dL (ref 70–99)

## 2021-07-20 LAB — BASIC METABOLIC PANEL
Anion gap: 7 (ref 5–15)
BUN: 23 mg/dL (ref 8–23)
CO2: 27 mmol/L (ref 22–32)
Calcium: 8.5 mg/dL — ABNORMAL LOW (ref 8.9–10.3)
Chloride: 108 mmol/L (ref 98–111)
Creatinine, Ser: 2.09 mg/dL — ABNORMAL HIGH (ref 0.44–1.00)
GFR, Estimated: 22 mL/min — ABNORMAL LOW (ref >60)
Glucose, Bld: 130 mg/dL — ABNORMAL HIGH (ref 70–99)
Potassium: 4.3 mmol/L (ref 3.5–5.1)
Sodium: 142 mmol/L (ref 135–145)

## 2021-07-20 LAB — CBC
HCT: 29.5 % — ABNORMAL LOW (ref 36.0–46.0)
Hemoglobin: 9.7 g/dL — ABNORMAL LOW (ref 12.0–15.0)
MCH: 29.8 pg (ref 26.0–34.0)
MCHC: 32.9 g/dL (ref 30.0–36.0)
MCV: 90.5 fL (ref 80.0–100.0)
Platelets: 189 10*3/uL (ref 150–400)
RBC: 3.26 MIL/uL — ABNORMAL LOW (ref 3.87–5.11)
RDW: 14.1 % (ref 11.5–15.5)
WBC: 7.5 10*3/uL (ref 4.0–10.5)
nRBC: 0 % (ref 0.0–0.2)

## 2021-07-20 LAB — HEMOGLOBIN A1C
Hgb A1c MFr Bld: 8.4 % — ABNORMAL HIGH (ref 4.8–5.6)
Mean Plasma Glucose: 194 mg/dL

## 2021-07-20 NOTE — Progress Notes (Signed)
Patient ID: Denise [name], female   DOB: 25-Dec-1929, 87 y.o.   MRN: [id number]  Subjective: The patient has no new complaints.  She still notes moderate to severe pain in her left hip region with any attempted movement, even when turning over in bed.  Objective: Vital signs in last 24 hours: Temp:  [97.4 F (36.3 C)-98.6 F (37 C)] 97.9 F (36.6 C) (09/27 1532) Pulse Rate:  [57-72] 61 (09/27 1532) Resp:  [16-20] 20 (09/27 1532) BP: (97-164)/(59-81) 149/59 (09/27 1532) SpO2:  [98 %-100 %] 100 % (09/27 1532)  Intake/Output from previous day: 09/26 0701 - 09/27 0700 In: 100 [I.V.:100] Out: -  Intake/Output this shift: Total I/O In: 240 [P.O.:240] Out: 200 [Urine:200]  Recent Labs    07/19/21 1235 07/20/21 0543  HGB 10.7* 9.7*   Recent Labs    07/19/21 1235 07/20/21 0543  WBC 8.5 7.5  RBC 3.65* 3.26*  HCT 32.2* 29.5*  PLT 189 189   Recent Labs    07/19/21 1235 07/20/21 0543  NA 140 142  K 4.0 4.3  CL 109 108  CO2 24 27  BUN 24* 23  CREATININE 2.08* 2.09*  GLUCOSE 209* 130*  CALCIUM 8.5* 8.5*   No results for input(s): LABPT, INR in the last 72 hours.  Physical Exam: Orthopedic examination is unchanged as compared to her examination yesterday evening in the emergency room.  She remains neurovascularly intact to the left lower extremity and foot.  Assessment: Mildly displaced left superior and inferior pubic rami fractures and minimally displaced fracture of left ilium.  Plan: The treatment options have been reviewed with the patient.  She will continue to be mobilized slowly with physical therapy as symptoms permit.  She may continue to receive pain medication as deemed appropriate medically.  Most likely she will require skilled nursing placement upon discharge.   Marshall Cork Lupe Bonner 07/20/2021, 5:11 PM

## 2021-07-20 NOTE — TOC Progression Note (Addendum)
Transition of Care [facility]) - Progression Note    Patient Details  Name: Denise [name] MRN: [id number] Date of Birth: 04/02/1930  [facility]) CM/SW [place], RN Phone Number: 07/20/2021, 2:33 PM  Clinical Narrative:     Spoke with Case worker at [facility] health,they are not able to approve until the patient is able to work with PT to get and actual evaluation, she refused to due to Pain, Once able to work with PT they will evaluate for approval to go to SNF Spoke with the Physical Therapist and the Bedside nurse, Will plan to make sure the patient is pre medicated for Pain prior to PT seeing her, the team will all work together to encouraged her to do as much as she can with PT, will resubmit to insurance for authorization after PT is able to work with her      Expected Discharge Plan and Services                                                 Social Determinants of Health (SDOH) Interventions    Readmission Risk Interventions No flowsheet data found.

## 2021-07-20 NOTE — Progress Notes (Signed)
[facility] NAME: Denise [name]    MR#:  [id number]  PCP: Rica Koyanagi, MD  DATE OF BIRTH:  August 27, 1930  SUBJECTIVE:  CHIEF COMPLAINT:   Chief Complaint  Patient presents with   Fall  Reports 6 out of 10 pain around her hip areas.  Waiting for her breakfast REVIEW OF SYSTEMS:  Review of Systems  Constitutional:  Negative for diaphoresis, fever, malaise/fatigue and weight loss.  HENT:  Negative for ear discharge, ear pain, hearing loss, nosebleeds, sore throat and tinnitus.   Eyes:  Negative for blurred vision and pain.  Respiratory:  Negative for cough, hemoptysis, shortness of breath and wheezing.   Cardiovascular:  Negative for chest pain, palpitations, orthopnea and leg swelling.  Gastrointestinal:  Negative for abdominal pain, blood in stool, constipation, diarrhea, heartburn, nausea and vomiting.  Genitourinary:  Negative for dysuria, frequency and urgency.  Musculoskeletal:  Positive for falls and joint pain. Negative for back pain and myalgias.  Skin:  Negative for itching and rash.  Neurological:  Negative for dizziness, tingling, tremors, focal weakness, seizures, weakness and headaches.  Psychiatric/Behavioral:  Negative for depression. The patient is not nervous/anxious.   DRUG ALLERGIES:   Allergies  Allergen Reactions   Iodinated Diagnostic Agents Anaphylaxis, Other (See Comments) and Rash   Benzocaine-Chloroxylenol-Hc     Years ago   Benzonatate Rash    Note: pruritis   VITALS:  Blood pressure 97/73, pulse (!) 57, temperature 98 F (36.7 C), resp. rate 16, height '5\' 6"'$  (1.676 m), weight 76.7 kg, SpO2 98 %. PHYSICAL EXAMINATION:  Physical Exam 87 year old female lying in the bed in some pain Eyes pupil equal round reactive to light and accommodation, no scleral icterus, extraocular muscles intact Neck supple nodulus distention, no thyroid enlargement or tenderness Lungs clear to auscultation bilaterally, no wheezing  rales rhonchi crepitation Cardiovascular S1-S2 normal, no murmur rales or gallop Abdomen soft, benign Neuro alert and oriented, nonfocal Extremities moderate tenderness over anterior aspect of both hips. Skin no rash or lesion Psychiatry normal mood and affect LABORATORY PANEL:  Female CBC Recent Labs  Lab 07/20/21 0543  WBC 7.5  HGB 9.7*  HCT 29.5*  PLT 189   ------------------------------------------------------------------------------------------------------------------ Chemistries  Recent Labs  Lab 07/20/21 0543  NA 142  K 4.3  CL 108  CO2 27  GLUCOSE 130*  BUN 23  CREATININE 2.09*  CALCIUM 8.5*   MEDICATIONS:  Scheduled Meds:  allopurinol  100 mg Oral QPM   ascorbic acid  250 mg Oral QPC lunch   atorvastatin  40 mg Oral q morning   cholecalciferol  2,000 Units Oral Daily   feeding supplement  237 mL Oral BID BM   fluticasone  2 spray Each Nare Daily   heparin  5,000 Units Subcutaneous Q8H   insulin aspart  0-15 Units Subcutaneous TID WC   insulin aspart  0-5 Units Subcutaneous QHS   insulin detemir  16 Units Subcutaneous QHS   irbesartan  75 mg Oral Daily   levothyroxine  100 mcg Oral QAC breakfast   metoprolol succinate  25 mg Oral Q breakfast   multivitamin with minerals  1 tablet Oral Daily   pantoprazole  40 mg Oral q morning   tiotropium  1 capsule Inhalation QPC lunch   traZODone  50 mg Oral QHS   Continuous Infusions: RADIOLOGY:  CT HEAD WO CONTRAST (5MM)  Result Date: 07/19/2021 CLINICAL DATA:  Unwitnessed fall.  Head trauma.  EXAM: CT HEAD WITHOUT CONTRAST CT CERVICAL SPINE WITHOUT CONTRAST TECHNIQUE: Multidetector CT imaging of the head and cervical spine was performed following the standard protocol without intravenous contrast. Multiplanar CT image reconstructions of the cervical spine were also generated. COMPARISON:  11/21/2019 FINDINGS: CT HEAD FINDINGS Brain: No evidence for acute hemorrhage, mass lesion, midline shift, hydrocephalus or large  infarct. Mild cerebral atrophy is stable. Diffuse low-density in the white matter is suggestive for chronic changes. Vascular: No hyperdense vessel or unexpected calcification. Skull: Normal. Negative for fracture or focal lesion. Sinuses/Orbits: No acute finding. Other: None. CT CERVICAL SPINE FINDINGS Alignment: Overall alignment is within normal limits. Minimal anterolisthesis of C2 on C3. Skull base and vertebrae: No acute fracture. No primary bone lesion or focal pathologic process. Soft tissues and spinal canal: No prevertebral fluid or swelling. No visible canal hematoma. Disc levels: Multilevel disc space narrowing. Bilateral degenerative facet arthropathy. Upper chest: Negative for pneumothorax. Scarring at lung apices. Few tiny peripheral nodular densities are likely incidental findings and related to scarring. Other: None IMPRESSION: 1. No acute intracranial abnormality. 2. Stable cerebral atrophy and evidence for chronic small vessel ischemic changes. 3. No acute bone abnormality in cervical spine. 4. Multilevel degenerative changes in the cervical spine. Electronically Signed   By: Markus Daft M.D.   On: 07/19/2021 16:54   CT Cervical Spine Wo Contrast  Result Date: 07/19/2021 CLINICAL DATA:  Unwitnessed fall.  Head trauma. EXAM: CT HEAD WITHOUT CONTRAST CT CERVICAL SPINE WITHOUT CONTRAST TECHNIQUE: Multidetector CT imaging of the head and cervical spine was performed following the standard protocol without intravenous contrast. Multiplanar CT image reconstructions of the cervical spine were also generated. COMPARISON:  11/21/2019 FINDINGS: CT HEAD FINDINGS Brain: No evidence for acute hemorrhage, mass lesion, midline shift, hydrocephalus or large infarct. Mild cerebral atrophy is stable. Diffuse low-density in the white matter is suggestive for chronic changes. Vascular: No hyperdense vessel or unexpected calcification. Skull: Normal. Negative for fracture or focal lesion. Sinuses/Orbits: No acute  finding. Other: None. CT CERVICAL SPINE FINDINGS Alignment: Overall alignment is within normal limits. Minimal anterolisthesis of C2 on C3. Skull base and vertebrae: No acute fracture. No primary bone lesion or focal pathologic process. Soft tissues and spinal canal: No prevertebral fluid or swelling. No visible canal hematoma. Disc levels: Multilevel disc space narrowing. Bilateral degenerative facet arthropathy. Upper chest: Negative for pneumothorax. Scarring at lung apices. Few tiny peripheral nodular densities are likely incidental findings and related to scarring. Other: None IMPRESSION: 1. No acute intracranial abnormality. 2. Stable cerebral atrophy and evidence for chronic small vessel ischemic changes. 3. No acute bone abnormality in cervical spine. 4. Multilevel degenerative changes in the cervical spine. Electronically Signed   By: Markus Daft M.D.   On: 07/19/2021 16:54   CT PELVIS WO CONTRAST  Result Date: 07/19/2021 CLINICAL DATA:  Left hip pain after unwitnessed fall EXAM: CT PELVIS WITHOUT CONTRAST TECHNIQUE: Multidetector CT imaging of the pelvis was performed following the standard protocol without intravenous contrast. COMPARISON:  Radiograph 07/19/2021, CT 07/25/2015 FINDINGS: Urinary Tract: Partially obscured by artifact. No abnormality visualized. Bowel:  Negative for bowel wall thickening. Vascular/Lymphatic: Mild aortic atherosclerosis. No aneurysm. No pelvic adenopathy Reproductive:  Uterus and adnexa are unremarkable Other:  Negative for pelvic effusion. Musculoskeletal: Right hip replacement with artifact. Acute mildly displaced fracture involving left superior pubic ramus. Acute mildly comminuted and displaced fracture involving left inferior pubic ramus. Pubic symphysis is intact. Additional mildly displaced fracture involving the left iliac bone that appears to  extend to the left SI joint. Left SI joint is non widened. Edema and probable small hematoma within the left obturator  internus muscle and gluteus minimus muscle IMPRESSION: 1. Acute mildly displaced left superior pubic ramus fracture with acute comminuted and mildly displaced left inferior pubic ramus fracture. 2. Acute minimally displaced fracture involving the left posterior iliac bone with likely extension to left SI joint. No SI joint widening. 3. Soft tissue swelling about the left hip with probable small intramuscular hematoma involving left obturator internus and gluteus minimus muscles. Electronically Signed   By: Donavan Foil M.D.   On: 07/19/2021 16:55   DG Chest Portable 1 View  Result Date: 07/19/2021 CLINICAL DATA:  Fall EXAM: PORTABLE CHEST 1 VIEW COMPARISON:  01/09/2020 FINDINGS: Left-sided pacing device with leads over right atrium and right ventricle. Mild cardiomegaly with vascular congestion. Diffuse interstitial opacity likely due to chronic disease. Negative for pneumothorax. IMPRESSION: 1. Cardiomegaly with vascular congestion. 2. Mild diffuse interstitial opacity felt secondary to chronic disease. Electronically Signed   By: Donavan Foil M.D.   On: 07/19/2021 15:52   ASSESSMENT AND PLAN:  87 year old female with a known history of essential hypertension, CKD stage IV, dementia, hyperlipidemia, paroxysmal A. fib, debility, diabetes, hypothyroidism, weakness is admitted for pubic rami fracture status post fall  9/26: Ortho recommends nonoperative management of pubic rami fracture 9/27: TOC working on getting her back to facility once PT/OT eval completes.  Principal Problem:   Closed fracture of left inferior pubic ramus (HCC) Active Problems:   Hyperlipidemia   Type 2 diabetes mellitus with hyperlipidemia (HCC)   Chronic kidney disease   PAF (paroxysmal atrial fibrillation) (HCC)   Weakness   CKD (chronic kidney disease), stage IV (HCC)   Essential hypertension  Mild displaced left superior and inferior pubic rami fracture with left iliac fracture status post fall present on  admission Orthopedics recommend nonoperative management Partial weightbearing of the left lower extremity PT, OT eval and placement at long-term care with therapy Pain meds as needed  CKD stage IV At baseline  Hyperlipidemia On statin  Essential hypertension On metoprolol and irbesartan  Insomnia On trazodone  DM type II Sliding scale insulin while in the hospital  Hypothyroidism Continue levothyroxine  GERD Continue PPI  COPD Stable on inhalers Body mass index is 27.28 kg/m.  Net IO Since Admission: 100 mL [07/20/21 1237]      LOS: 0 days   Consultants: Orthopedics  Status is: Observation  The patient remains OBS appropriate and will d/c before 2 midnights.  Dispo: The patient is from:  [facility]              Anticipated d/c is to: SNF              Patient currently is not medically stable to d/c.   Difficult to place patient No   DVT prophylaxis:       heparin injection 5,000 Units Start: 07/19/21 1745 Place TED hose Start: 07/19/21 1739     Family Communication: Updated Debra/daughter over phone   All the records are reviewed and case discussed with Nursing and TOC team. Management plans discussed with the patient, family and they are in agreement.  CODE STATUS: Full Code Level of care: Med-Surg  TOTAL TIME TAKING CARE OF THIS PATIENT: 25 minutes.   More than 50% of the time was spent in counseling/coordination of care: YES  POSSIBLE D/C IN 1-2 DAYS, DEPENDING ON CLINICAL CONDITION.   Margarete Horace  Manuella Ghazi M.D on 07/20/2021 at 12:37 PM  [facility]   CC: Primary care physician; Rica Koyanagi, MD  Note: This dictation was prepared with Dragon dictation along with smaller phrase technology. Any transcriptional errors that result from this process are unintentional.

## 2021-07-20 NOTE — Progress Notes (Signed)
Initial Nutrition Assessment  DOCUMENTATION CODES:  Not applicable  INTERVENTION:  Recommend liberalizing diet to regular.  Obtain updated weight.  Continue Ensure BID.  Continue MVI with minerals daily.  Encourage PO intake.  NUTRITION DIAGNOSIS:  Increased nutrient needs related to hip fracture as evidenced by estimated needs.  GOAL:  Patient will meet greater than or equal to 90% of their needs  MONITOR:  PO intake, Supplement acceptance, Diet advancement, Labs, Weight trends, I & O's  REASON FOR ASSESSMENT:  Consult Hip fracture protocol  ASSESSMENT:  87 yo female with a PMH of COPD, diabetes, CKD, atrial fibrillation, HTN, and hypothyroidism, who lives in a skilled nursing facility who is admitted with a closed L inferior pubic ramus fracture.  RD working remotely. Attempted to call patient's room phone. Pt did not answer.  Ortho not planning surgical intervention at this time.  Weight appears to be stated due to exactness. Weight also appears to be trending down per Epic over the past two years. RD to order measured weight to determine any weight changes.  RD suspects patient may have some degree of malnutrition, however, RD cannot definitively diagnose at this time.  Recommend liberalizing diet to regular given age and controlled A1c. Secure-chatted MD regarding this recommendation.  Also recommend continuing Ensure Enlive BID and MVI with minerals daily.  Supplements: Ensure BID  Medications: reviewed; Vitamin C, Vitamin D, SSI, Levemir, Synthroid, MVI with minerals, Protonix, Vicodin PO PRN (given once today)  Labs: reviewed; CBG 122-190 (H) HbA1c: 7% (03/12/2021)  NUTRITION - FOCUSED PHYSICAL EXAM: Unable to perform - defer to in-person assessment  Diet Order:   Diet Order             Diet regular Room service appropriate? Yes; Fluid consistency: Thin  Diet effective now                  EDUCATION NEEDS:  No education needs have been  identified at this time  Skin:  Skin Assessment: Reviewed RN Assessment  Last BM:  PTA/unknown  Height:  Ht Readings from Last 1 Encounters:  07/19/21 '5\' 6"'$  (1.676 m)   Weight:  Wt Readings from Last 1 Encounters:  07/19/21 76.7 kg   BMI:  Body mass index is 27.28 kg/m.  Estimated Nutritional Needs:  Kcal:  1600-1800 Protein:  85-100 grams Fluid:  >1.6 L  Derrel Nip, RD, LDN (she/her/hers) Registered Dietitian I After-Hours/Weekend Pager # in [place]

## 2021-07-20 NOTE — Evaluation (Signed)
Physical Therapy Evaluation Patient Details Name: Denise [name] MRN: [id number] DOB: 24-Aug-1930 Today's Date: 07/20/2021  History of Present Illness  87 y.o. female with multiple medical problems including COPD, diabetes, chronic kidney disease, atrial fibrillation, hypertension, and hypothyroidism who lives in a skilled nursing facility.  Apparently, she lost her balance and fell while trying to put some clothes away last evening in her room, causing her to injure her left hip.  She was brought to the emergency room where x-rays and a subsequent pelvic CT scan demonstrated the presence of the superior and inferior pubic rami fractures as well as a left iliac wing fracture. No surgical intervention needed.    Clinical Impression  Pt received in Semi-Fowler's position and semi-agreeable to therapy.  Pt unaware of surroundings and only able to orient to person.  Therapist attempted to have pt perform bed-level exercises, however pt refused to perform following initial attempt.  Pt is able to perform ankle pumps, however notes pain to be present in hip and refuses to perform any more exercises following ankle pumps.  Due to this, therapist anticipates all needs to be max - total A at this time for mobility.  Pt will benefit from skilled PT intervention to increase independence and safety with basic mobility in preparation for discharge to the venue listed below.        Recommendations for follow up therapy are one component of a multi-disciplinary discharge planning process, led by the attending physician.  Recommendations may be updated based on patient status, additional functional criteria and insurance authorization.  Follow Up Recommendations SNF    Equipment Recommendations  None recommended by PT    Recommendations for Other Services       Precautions / Restrictions Precautions Precautions: Fall Restrictions Weight Bearing Restrictions: Yes LLE Weight Bearing: Partial weight bearing       Mobility  Bed Mobility Overal bed mobility: Needs Assistance             General bed mobility comments: Pt refused to perform any bed mobility and screamed when attempted to assist pt with LE's.    Transfers                 General transfer comment: Pt refused OOB secondary to pain  Ambulation/Gait                Stairs            Wheelchair Mobility    Modified Rankin (Stroke Patients Only)       Balance                                             Pertinent Vitals/Pain Pain Assessment: Faces Faces Pain Scale: Hurts worst Pain Location: L LE Pain Descriptors / Indicators: Grimacing;Guarding;Discomfort Pain Intervention(s): Limited activity within patient's tolerance;Monitored during session;Premedicated before session    Home Living Family/patient expects to be discharged to:: Skilled nursing facility                      Prior Function Level of Independence: Needs assistance         Comments: Pt is poor historian. She reports independence in mobility and self care with use of RW. No family present to determine if this is accurate.     Hand Dominance   Dominant Hand: Right    Extremity/Trunk  Assessment   Upper Extremity Assessment Upper Extremity Assessment: Defer to OT evaluation RUE Deficits / Details: R UE appeared to have less active shoulder elevation but difficult to determine secondary to pt becoming resistive and unable to follow commands    Lower Extremity Assessment Lower Extremity Assessment: Difficult to assess due to impaired cognition;LLE deficits/detail (Pt notes pain in L LE, but refused to perform any type of movement with therapist at this time.) LLE: Unable to fully assess due to pain       Communication   Communication: No difficulties  Cognition Arousal/Alertness: Awake/alert Behavior During Therapy: Anxious;Agitated Overall Cognitive Status: No family/caregiver present  to determine baseline cognitive functioning                                 General Comments: Pt is oriented to self only.  She is unable to tell me month, year, or situation. Pt very resistive to all movement this session, but eventually opened up to perform some bed level exercises.      General Comments      Exercises Other Exercises Other Exercises: Pt educated on roles of Pt and services provided during hospital stay.  Pt encouraged to perform mobility in order to prevent muscular atrophy of the LE's, however pt refused.   Assessment/Plan    PT Assessment Patient needs continued PT services  PT Problem List Decreased strength;Decreased activity tolerance;Decreased balance;Decreased mobility;Decreased cognition;Obesity;Pain       PT Treatment Interventions Gait training;Functional mobility training;Therapeutic activities;Therapeutic exercise;Balance training;Neuromuscular re-education    PT Goals (Current goals can be found in the Care Plan section)  Acute Rehab PT Goals Patient Stated Goal: to decrease pain PT Goal Formulation: Patient unable to participate in goal setting Time For Goal Achievement: 08/03/21 Potential to Achieve Goals: Fair    Frequency Min 2X/week   Barriers to discharge        Co-evaluation               AM-PAC PT "6 Clicks" Mobility  Outcome Measure Help needed turning from your back to your side while in a flat bed without using bedrails?: Total Help needed moving from lying on your back to sitting on the side of a flat bed without using bedrails?: Total Help needed moving to and from a bed to a chair (including a wheelchair)?: Total Help needed standing up from a chair using your arms (e.g., wheelchair or bedside chair)?: Total Help needed to walk in hospital room?: Total Help needed climbing 3-5 steps with a railing? : Total 6 Click Score: 6    End of Session   Activity Tolerance: Patient limited by pain Patient left:  in bed;with call bell/phone within reach;with bed alarm set Nurse Communication: Mobility status PT Visit Diagnosis: Pain Pain - Right/Left: Left Pain - part of body: Hip;Leg;Ankle and joints of foot    Time: [id number] PT Time Calculation (min) (ACUTE ONLY): 9 min   Charges:   PT Evaluation $PT Eval Low Complexity: 1 Low          Gwenlyn Saran, PT, DPT 07/20/21, 1:53 PM   Christie Nottingham 07/20/2021, 1:51 PM

## 2021-07-20 NOTE — TOC Initial Note (Signed)
Transition of Care [facility]) - Initial/Assessment Note    Patient Details  Name: Denise [name] MRN: [id number] Date of Birth: Apr 15, 1930  Transition of Care [facility]) CM/SW Contact:    Su Hilt, RN Phone Number: 07/20/2021, 10:53 AM  Clinical Narrative:    Talked with the daughter [name]  on the phone, the plan at DC will be to return to [facility], the patient is a LTC resident at [facility], We will attempt to get Insurance approval to go [facility] prior to transitioning back to LTC, PT to assess and make recommendations, FL2 completed, PASSr obtained [facility] accepting the patient back                     Patient Goals and CMS Choice        Expected Discharge Plan and Services                                                Prior Living Arrangements/Services                       Activities of Daily Living Home Assistive Devices/Equipment: Environmental consultant (specify type), Eyeglasses ADL Screening (condition at time of admission) Patient's cognitive ability adequate to safely complete daily activities?: No Is the patient deaf or have difficulty hearing?: Yes Does the patient have difficulty seeing, even when wearing glasses/contacts?: No Does the patient have difficulty concentrating, remembering, or making decisions?: Yes Patient able to express need for assistance with ADLs?: Yes Does the patient have difficulty dressing or bathing?: Yes Independently performs ADLs?: No Communication: Independent Dressing (OT): Needs assistance Is this a change from baseline?: Pre-admission baseline Grooming: Needs assistance Is this a change from baseline?: Pre-admission baseline Feeding: Independent Bathing: Needs assistance Toileting: Needs assistance In/Out Bed: Needs assistance Walks in Home: Needs assistance Does the patient have difficulty walking or climbing stairs?: Yes Weakness of Legs: Both Weakness of Arms/Hands: Both  Permission Sought/Granted                   Emotional Assessment              Admission diagnosis:  Fall, initial encounter [W19.XXXA] Closed fracture of left inferior pubic ramus ([place]) [S32.592A] Closed displaced fracture of pelvis, unspecified part of pelvis, initial encounter ([place]) [S32.9XXA] Patient Active Problem List   Diagnosis Date Noted   Closed fracture of left inferior pubic ramus ([place]) 07/19/2021   CKD (chronic kidney disease), stage IV ([place]) 07/19/2021   Essential hypertension 07/19/2021   Insomnia    Acute kidney injury superimposed on CKD ([place])    Acute sinusitis    Weakness    Left arm swelling    Intramural hematoma of artery of right upper extremity 03/12/2021   Hematoma    Anemia in chronic kidney disease 05/06/2020   Sinus pause 11/30/2019   PAF (paroxysmal atrial fibrillation) ([place]) 11/30/2019   Atrial fibrillation with RVR ([place]) 11/28/2019   ESRD on dialysis ([place]) 04/11/2017   Hyperlipidemia 01/05/2017   Type 2 diabetes mellitus with hyperlipidemia ([place]) 01/05/2017   Chronic kidney disease 01/05/2017   Intertrochanteric fracture of right hip ([place]) 08/03/2015   Hypoglycemia secondary to sulfonylurea 07/29/2015   PCP:  Rica Koyanagi, MD Pharmacy:   [facility], [address]  [place] [address] Phone: [phone] Fax: [phone]  [place], [place] [address] [place] [address] Phone: [phone] Fax: [phone]     Social Determinants of Health (SDOH) Interventions    Readmission Risk Interventions No flowsheet data found.

## 2021-07-20 NOTE — Evaluation (Signed)
Occupational Therapy Evaluation Patient Details Name: Denise [name] MRN: [id number] DOB: 1930-04-16 Today's Date: 07/20/2021   History of Present Illness 87 y.o. female with multiple medical problems including COPD, diabetes, chronic kidney disease, atrial fibrillation, hypertension, and hypothyroidism who lives in a skilled nursing facility.  Apparently, she lost her balance and fell while trying to put some clothes away last evening in her room, causing her to injure her left hip.  She was brought to the emergency room where x-rays and a subsequent pelvic CT scan demonstrated the presence of the superior and inferior pubic rami fractures as well as a left iliac wing fracture. No surgical intervention needed.   Clinical Impression   Patient presenting with decreased Ind in self care, balance, functional mobility/transfer, endurance, and safety awareness. Patient is poor historian and oriented to self only. She is unable to verbalize situation, time, or location. She believes we are in [place].Pt reports being a LTC resident at [facility] but reports ind in all aspects of self care and mobility with use of RW. Pt becomes resistive to all movement and tasks this session including UE assessment. Pt crying out in pain just when bed was lowered and leveled and when OT moving pt's LEs towards EOB. OT anticipates all needs to be max - total A at this time for self care and mobility. Patient will benefit from acute OT to increase overall independence in the areas of ADLs, functional mobility, safety awareness in order to safely discharge to next venue of care.      Recommendations for follow up therapy are one component of a multi-disciplinary discharge planning process, led by the attending physician.  Recommendations may be updated based on patient status, additional functional criteria and insurance authorization.   Follow Up Recommendations  SNF;Supervision/Assistance - 24 hour    Equipment  Recommendations  Other (comment) (defer to next venue of care)       Precautions / Restrictions Precautions Precautions: Fall Restrictions Weight Bearing Restrictions: Yes LLE Weight Bearing: Partial weight bearing      Mobility Bed Mobility Overal bed mobility: Needs Assistance             General bed mobility comments: total A to move LEs towards EOB. Pt even crying out when lower half of bed is lowered.    Transfers                 General transfer comment: Pt refused OOB secondary to pain        ADL either performed or assessed with clinical judgement   ADL Overall ADL's : Needs assistance/impaired                                       General ADL Comments: Pt can perform grooming tasks herself but likely needs max - total A for all self care and mobility based on her performance this session.     Vision Patient Visual Report: No change from baseline              Pertinent Vitals/Pain Pain Assessment: Faces Faces Pain Scale: Hurts worst Pain Location: L LE Pain Descriptors / Indicators: Grimacing;Guarding;Discomfort Pain Intervention(s): Limited activity within patient's tolerance;Monitored during session;Premedicated before session;Repositioned     Hand Dominance Right   Extremity/Trunk Assessment Upper Extremity Assessment Upper Extremity Assessment: Difficult to assess due to impaired cognition;RUE deficits/detail RUE Deficits / Details: R UE appeared to  have less active shoulder elevation but difficult to determine secondary to pt becoming resistive and unable to follow commands   Lower Extremity Assessment Lower Extremity Assessment: Defer to PT evaluation       Communication Communication Communication: No difficulties   Cognition Arousal/Alertness: Awake/alert Behavior During Therapy: Anxious;Flat affect Overall Cognitive Status: No family/caregiver present to determine baseline cognitive functioning                                  General Comments: Pt is oriented to self only. She reports we are in [place]. She is unable to tell me month, year, or situation. Pt very resistive to all movement this session including UEs.              Home Living Family/patient expects to be discharged to:: Skilled nursing facility                                        Prior Functioning/Environment Level of Independence: Needs assistance        Comments: Pt is poor historian. She reports independence in mobility and self care with use of RW. No family present to determine if this is accurate.        OT Problem List: Decreased strength;Decreased activity tolerance;Impaired balance (sitting and/or standing);Decreased knowledge of use of DME or AE;Decreased safety awareness;Decreased cognition;Cardiopulmonary status limiting activity;Pain      OT Treatment/Interventions: Self-care/ADL training;Therapeutic exercise;Therapeutic activities;Energy conservation;DME and/or AE instruction;Balance training;Patient/family education;Modalities;Manual therapy;Cognitive remediation/compensation    OT Goals(Current goals can be found in the care plan section) Acute Rehab OT Goals Patient Stated Goal: to get better OT Goal Formulation: With patient Time For Goal Achievement: 08/03/21 Potential to Achieve Goals: Fair ADL Goals Pt Will Perform Grooming: with min assist;standing Pt Will Perform Lower Body Dressing: with min assist;sit to/from stand Pt Will Transfer to Toilet: with min assist;ambulating Pt Will Perform Toileting - Clothing Manipulation and hygiene: with min assist;sit to/from stand  OT Frequency: Min 2X/week   Barriers to D/C:    none known, she is LTC resident at [facility].          AM-PAC OT "6 Clicks" Daily Activity     Outcome Measure Help from another person eating meals?: None Help from another person taking care of personal grooming?: A Little Help from another  person toileting, which includes using toliet, bedpan, or urinal?: Total Help from another person bathing (including washing, rinsing, drying)?: Total Help from another person to put on and taking off regular upper body clothing?: Total Help from another person to put on and taking off regular lower body clothing?: Total 6 Click Score: 11   End of Session Nurse Communication: Mobility status;Patient requests pain meds  Activity Tolerance: Patient limited by pain Patient left: in bed;with call bell/phone within reach;with bed alarm set  OT Visit Diagnosis: Unsteadiness on feet (R26.81);Repeated falls (R29.6);Muscle weakness (generalized) (M62.81);History of falling (Z91.81);Pain Pain - Right/Left: Left Pain - part of body: Hip                Time: [id number] OT Time Calculation (min): 21 min Charges:  OT General Charges $OT Visit: 1 Visit OT Evaluation $OT Eval Moderate Complexity: 1 Mod OT Treatments $Therapeutic Activity: 8-22 mins  Darleen Crocker, MS, OTR/L , CBIS ascom [phone]  07/20/21, 11:01 AM

## 2021-07-20 NOTE — NC FL2 (Signed)
[place] LEVEL OF CARE SCREENING TOOL     IDENTIFICATION  Patient Name: Denise [name] Birthdate: [birth year] Sex: female Admission Date (Current Location): 07/19/2021  [facility] and [place] Number:  Engineering geologist and Address:  [facility], [address] [place], [place] [address]      Provider Number: [id number]  Attending Physician Name and Address:  Max Sane, MD  Relative Name and Phone Number:  [name] Daughter [phone]    Current Level of Care: Hospital Recommended Level of Care: [place] Prior Approval Number:    Date Approved/Denied:   PASRR Number: [id number]  Discharge Plan: SNF    Current Diagnoses: Patient Active Problem List   Diagnosis Date Noted   Closed fracture of left inferior pubic ramus ([place]) 07/19/2021   CKD (chronic kidney disease), stage IV ([place]) 07/19/2021   Essential hypertension 07/19/2021   Insomnia    Acute kidney injury superimposed on CKD [facility])    Acute sinusitis    Weakness    Left arm swelling    Intramural hematoma of artery of right upper extremity 03/12/2021   Hematoma    Anemia in chronic kidney disease 05/06/2020   Sinus pause 11/30/2019   PAF (paroxysmal atrial fibrillation) ([place]) 11/30/2019   Atrial fibrillation with RVR ([place]) 11/28/2019   ESRD on dialysis ([place]) 04/11/2017   Hyperlipidemia 01/05/2017   Type 2 diabetes mellitus with hyperlipidemia ([place]) 01/05/2017   Chronic kidney disease 01/05/2017   Intertrochanteric fracture of right hip ([place]) 08/03/2015   Hypoglycemia secondary to sulfonylurea 07/29/2015    Orientation RESPIRATION BLADDER Height & Weight     Self  Normal External catheter Weight: 76.7 kg Height:  '5\' 6"'$  (167.6 cm)  BEHAVIORAL SYMPTOMS/MOOD NEUROLOGICAL BOWEL NUTRITION STATUS      Continent Diet (carb modified)  AMBULATORY STATUS COMMUNICATION OF NEEDS Skin   Extensive Assist Verbally Normal                       Personal  Care Assistance Level of Assistance  Bathing, Feeding, Dressing Bathing Assistance: Limited assistance Feeding assistance: Limited assistance Dressing Assistance: Limited assistance     Functional Limitations Info             SPECIAL CARE FACTORS FREQUENCY  PT (By licensed PT), OT (By licensed OT)     PT Frequency: 5 times per week OT Frequency: 5 times per week            Contractures Contractures Info: Not present    Additional Factors Info  Allergies, Code Status, Insulin Sliding Scale Code Status Info: FUll Code Allergies Info: Iodinated Diagnostic Agents, Benzocaine-chloroxylenol-hc, Benzonatate   Insulin Sliding Scale Info: CBG 70 - 120: 0 units   CBG 121 - 150: 0 units   CBG 151 - 200: 0 units   CBG 201 - 250: 2 units   CBG 251 - 300: 3 units   CBG 301 - 350: 4 units   CBG 351 - 400: 5 units       Current Medications (07/20/2021):  This is the current hospital active medication list Current Facility-Administered Medications  Medication Dose Route Frequency Provider Last Rate Last Admin   allopurinol (ZYLOPRIM) tablet 100 mg  100 mg Oral QPM Cox, Amy N, DO   100 mg at 07/19/21 2304   ascorbic acid (VITAMIN C) tablet 250 mg  250 mg Oral QPC lunch Cox, Amy N, DO       atorvastatin (LIPITOR)  tablet 40 mg  40 mg Oral q morning Cox, Amy N, DO   40 mg at 07/20/21 [id number]   cholecalciferol (VITAMIN D3) tablet 2,000 Units  2,000 Units Oral Daily Cox, Amy N, DO   2,000 Units at 07/20/21 0950   feeding supplement (ENSURE ENLIVE / ENSURE PLUS) liquid 237 mL  237 mL Oral BID BM Cox, Amy N, DO   237 mL at 07/20/21 0950   fentaNYL (SUBLIMAZE) injection 12.5 mcg  12.5 mcg Intravenous Q2H PRN Cox, Amy N, DO       fluticasone (FLONASE) 50 MCG/ACT nasal spray 2 spray  2 spray Each Nare Daily Cox, Amy N, DO   2 spray at 07/20/21 0950   heparin injection 5,000 Units  5,000 Units Subcutaneous Q8H Cox, Amy N, DO   5,000 Units at 07/20/21 [id number]   HYDROcodone-acetaminophen (NORCO/VICODIN) 5-325  MG per tablet 1-2 tablet  1-2 tablet Oral Q6H PRN Cox, Amy N, DO   2 tablet at 07/20/21 [id number]   insulin aspart (novoLOG) injection 0-15 Units  0-15 Units Subcutaneous TID WC Cox, Amy N, DO   2 Units at 07/20/21 0857   insulin aspart (novoLOG) injection 0-5 Units  0-5 Units Subcutaneous QHS Cox, Amy N, DO       insulin detemir (LEVEMIR) injection 16 Units  16 Units Subcutaneous QHS Cox, Amy N, DO       irbesartan (AVAPRO) tablet 75 mg  75 mg Oral Daily Cox, Amy N, DO   75 mg at 07/20/21 [id number]   levothyroxine (SYNTHROID) tablet 100 mcg  100 mcg Oral QAC breakfast Cox, Amy N, DO   100 mcg at 07/20/21 [id number]   metoprolol succinate (TOPROL-XL) 24 hr tablet 25 mg  25 mg Oral Q breakfast Cox, Amy N, DO   25 mg at 07/20/21 [id number]   morphine 2 MG/ML injection 0.5 mg  0.5 mg Intravenous Q2H PRN Cox, Amy N, DO       multivitamin with minerals tablet 1 tablet  1 tablet Oral Daily Cox, Amy N, DO   1 tablet at 07/20/21 0950   pantoprazole (PROTONIX) EC tablet 40 mg  40 mg Oral q morning Cox, Amy N, DO   40 mg at 07/20/21 0950   tiotropium (SPIRIVA) inhalation capsule ([facility] use ONLY) 18 mcg  1 capsule Inhalation QPC lunch Cox, Amy N, DO       traZODone (DESYREL) tablet 50 mg  50 mg Oral QHS Cox, Amy N, DO   50 mg at 07/19/21 2209     Discharge Medications: Please see discharge summary for a list of discharge medications.  Relevant Imaging Results:  Relevant Lab Results:   Additional Information SS#: [id number]. Not vaccinated.  Su Hilt, RN

## 2021-07-20 NOTE — Hospital Course (Signed)
87 year old female with a known history of essential hypertension, CKD stage IV, dementia, hyperlipidemia, paroxysmal A. fib, debility, diabetes, hypothyroidism, weakness is admitted for pubic rami fracture status post fall  9/26: Ortho recommends nonoperative management of pubic rami fracture 9/27: TOC working on getting her back to facility once PT/OT eval completes.

## 2021-07-20 NOTE — TOC Progression Note (Signed)
Transition of Care [facility]) - Progression Note    Patient Details  Name: Denise [name] MRN: [id number] Date of Birth: 1930/09/06  Transition of Care [facility]) CM/SW [place], RN Phone Number: 07/20/2021, 2:19 PM  Clinical Narrative:    Uploaded the clinical notes to [facility] Portal to request approval for [facility], the ref number is [id number]        Expected Discharge Plan and Services                                                 Social Determinants of Health (SDOH) Interventions    Readmission Risk Interventions No flowsheet data found.

## 2021-07-21 DIAGNOSIS — N189 Chronic kidney disease, unspecified: Secondary | ICD-10-CM | POA: Diagnosis not present

## 2021-07-21 DIAGNOSIS — Z515 Encounter for palliative care: Secondary | ICD-10-CM | POA: Diagnosis not present

## 2021-07-21 DIAGNOSIS — R5381 Other malaise: Secondary | ICD-10-CM | POA: Diagnosis not present

## 2021-07-21 DIAGNOSIS — S32592A Other specified fracture of left pubis, initial encounter for closed fracture: Secondary | ICD-10-CM | POA: Diagnosis not present

## 2021-07-21 DIAGNOSIS — S32810A Multiple fractures of pelvis with stable disruption of pelvic ring, initial encounter for closed fracture: Secondary | ICD-10-CM | POA: Diagnosis not present

## 2021-07-21 DIAGNOSIS — R279 Unspecified lack of coordination: Secondary | ICD-10-CM | POA: Diagnosis not present

## 2021-07-21 DIAGNOSIS — I10 Essential (primary) hypertension: Secondary | ICD-10-CM | POA: Diagnosis not present

## 2021-07-21 DIAGNOSIS — M6281 Muscle weakness (generalized): Secondary | ICD-10-CM | POA: Diagnosis not present

## 2021-07-21 LAB — GLUCOSE, CAPILLARY
Glucose-Capillary: 135 mg/dL — ABNORMAL HIGH (ref 70–99)
Glucose-Capillary: 108 mg/dL — ABNORMAL HIGH (ref 70–99)
Glucose-Capillary: 146 mg/dL — ABNORMAL HIGH (ref 70–99)

## 2021-07-21 MED ORDER — HYDROCODONE-ACETAMINOPHEN 5-325 MG PO TABS: 2.0000 | ORAL_TABLET | Freq: Four times a day (QID) | ORAL | 0 refills | Status: AC | PRN

## 2021-07-21 NOTE — Progress Notes (Signed)
Report called to [facility] @ [facility]

## 2021-07-21 NOTE — Progress Notes (Signed)
EMS here to transport pt. 

## 2021-07-21 NOTE — TOC Progression Note (Signed)
Transition of Care [facility]) - Progression Note    Patient Details  Name: Denise [name] MRN: [id number] Date of Birth: 07-25-1930  Transition of Care [facility]) CM/SW [place], RN Phone Number: 07/21/2021, 9:26 AM  Clinical Narrative:    The patient will DC back to [facility] today and [facility] followed by [facility], Faxed the Hand P and DC summary to [facility] at [phone], She will be transported via EMS, she has dementia and not able to work with PT.Daughter [name] is aware of the DC back to [facility] and in agreeance        Expected Discharge Plan and Services           Expected Discharge Date: 07/21/21                                     Social Determinants of Health (SDOH) Interventions    Readmission Risk Interventions No flowsheet data found.

## 2021-07-21 NOTE — Plan of Care (Signed)
PMT note: Patient is resting in bed. She is confused and is only able to tell me her name. Will need to speak with family for [place] conversation.

## 2021-07-21 NOTE — Progress Notes (Signed)
Patient ID: Denise [name], female   DOB: 10-Dec-1929, 87 y.o.   MRN: [id number]  Subjective: The patient has no new complaints.  She continues to complain of moderate to severe pain in her left hip region with any attempted movement, even when turning over in bed.   Objective: Vital signs in last 24 hours: Temp:  [97.9 F (36.6 C)-99.5 F (37.5 C)] 98.6 F (37 C) (09/28 0926) Pulse Rate:  [57-93] 93 (09/28 0926) Resp:  [16-20] 16 (09/28 0926) BP: (97-155)/(59-94) 150/94 (09/28 0926) SpO2:  [93 %-100 %] 93 % (09/28 0926)  Intake/Output from previous day: 09/27 0701 - 09/28 0700 In: 240 [P.O.:240] Out: 600 [Urine:600] Intake/Output this shift: No intake/output data recorded.  Recent Labs    07/19/21 1235 07/20/21 0543  HGB 10.7* 9.7*   Recent Labs    07/19/21 1235 07/20/21 0543  WBC 8.5 7.5  RBC 3.65* 3.26*  HCT 32.2* 29.5*  PLT 189 189   Recent Labs    07/19/21 1235 07/20/21 0543  NA 140 142  K 4.0 4.3  CL 109 108  CO2 24 27  BUN 24* 23  CREATININE 2.08* 2.09*  GLUCOSE 209* 130*  CALCIUM 8.5* 8.5*   No results for input(s): LABPT, INR in the last 72 hours.  Physical Exam: Orthopedic examination is limited to the left hip and lower extremity.  Overall findings remain unchanged.  She is neurovascularly intact to the left lower extremity and foot.  Assessment: Mildly displaced left superior and inferior pubic rami fractures and minimally displaced left iliac wing fracture.  Plan: The treatment options again are reviewed with the patient.  She will continue to be mobilized with physical therapy as symptoms permit.  She may continue to receive her pain medication as felt to be medically appropriate.  She will be discharged to a skilled nursing facility when a bed is available.  I will sign off at this time.  Please arrange for her to follow-up in my office and 4 to 6 weeks for repeat x-rays.  If you have further need of orthopedic input during this  hospitalization, please reconsult me.   Marshall Cork Denise [name] 07/21/2021, 10:01 AM

## 2021-07-21 NOTE — Discharge Summary (Addendum)
[facility] [place] NAME: Denise [name]    MR#:  [id number]  DATE OF BIRTH:  1930-04-21  DATE OF ADMISSION:  07/19/2021   ADMITTING PHYSICIAN: Amy N Cox, DO  DATE OF DISCHARGE: 07/21/2021  PRIMARY CARE PHYSICIAN: Rica Koyanagi, MD   ADMISSION DIAGNOSIS:  Fall, initial encounter [W19.XXXA] Closed fracture of left inferior pubic ramus ([place]) [S32.592A] Closed displaced fracture of pelvis, unspecified part of pelvis, initial encounter ([place]) [S32.9XXA] DISCHARGE DIAGNOSIS:  Principal Problem:   Closed fracture of left inferior pubic ramus (HCC) Active Problems:   Hyperlipidemia   Type 2 diabetes mellitus with hyperlipidemia (HCC)   Chronic kidney disease   PAF (paroxysmal atrial fibrillation) (HCC)   Weakness   CKD (chronic kidney disease), stage IV (HCC)   Essential hypertension  SECONDARY DIAGNOSIS:   Past Medical History:  Diagnosis Date   A-fib ([place])    Arthritis    Cataract    Chronic kidney disease    COPD (chronic obstructive pulmonary disease) ([place])    Diabetes mellitus without complication (HCC)    Emphysema of lung (HCC)    GERD (gastroesophageal reflux disease)    Hypertension    Hypothyroidism    HOSPITAL COURSE:  87 year old female with a known history of essential hypertension, CKD stage IV, severe dementia, hyperlipidemia, paroxysmal A. fib, debility, diabetes, hypothyroidism, weakness is admitted for pubic rami fracture status post fall   Mild displaced left superior and inferior pubic rami fracture with left iliac fracture status post fall present on admission Orthopedics recommend nonoperative management   CKD stage IV Hyperlipidemia Essential hypertension Insomnia DM type II Hypothyroidism GERD COPD Goals of care - after d/w daughter decision to transition to comfort care and hospice at the [facility] term [facility] which is very appropriate.  Her prognosis is < 6 months. DISCHARGE CONDITIONS:  fair CONSULTS  OBTAINED:  Treatment Team:  Corky Mull, MD DRUG ALLERGIES:   Allergies  Allergen Reactions   Iodinated Diagnostic Agents Anaphylaxis, Other (See Comments) and Rash   Benzocaine-Chloroxylenol-Hc     Years ago   Benzonatate Rash    Note: pruritis   DISCHARGE MEDICATIONS:   Allergies as of 07/21/2021       Reactions   Iodinated Diagnostic Agents Anaphylaxis, Other (See Comments), Rash   Benzocaine-chloroxylenol-hc    Years ago   Benzonatate Rash   Note: pruritis        Medication List     STOP taking these medications    acetaminophen 325 MG tablet Commonly known as: TYLENOL   allopurinol 100 MG tablet Commonly known as: ZYLOPRIM   ascorbic acid 250 MG tablet Commonly known as: VITAMIN C   atorvastatin 40 MG tablet Commonly known as: LIPITOR   cetirizine 5 MG tablet Commonly known as: ZYRTEC   feeding supplement Liqd   fluticasone 50 MCG/ACT nasal spray Commonly known as: FLONASE   furosemide 40 MG tablet Commonly known as: LASIX   Fusion Plus Caps   hydrocortisone 1 % lotion   insulin aspart 100 UNIT/ML injection Commonly known as: novoLOG   insulin detemir 100 UNIT/ML injection Commonly known as: LEVEMIR   insulin lispro 100 UNIT/ML injection Commonly known as: HUMALOG   levothyroxine 100 MCG tablet Commonly known as: SYNTHROID   metoprolol succinate 25 MG 24 hr tablet Commonly known as: TOPROL-XL   multivitamin with minerals Tabs tablet   nystatin cream Commonly known as: MYCOSTATIN   oxyCODONE-acetaminophen 5-325 MG tablet Commonly known as: PERCOCET/ROXICET  pantoprazole 40 MG tablet Commonly known as: PROTONIX   polyethylene glycol 17 g packet Commonly known as: MIRALAX / GLYCOLAX   Spiriva HandiHaler 18 MCG inhalation capsule Generic drug: tiotropium   Systane Nighttime Oint   traZODone 50 MG tablet Commonly known as: DESYREL   valsartan 80 MG tablet Commonly known as: DIOVAN   Vitamin D3 25 MCG tablet Commonly  known as: Vitamin D       TAKE these medications    HYDROcodone-acetaminophen 5-325 MG tablet Commonly known as: NORCO/VICODIN Take 2 tablets by mouth every 6 (six) hours as needed for up to 3 days for moderate pain.       DISCHARGE INSTRUCTIONS:   DIET:  Regular diet DISCHARGE CONDITION:  Fair ACTIVITY:  Activity as tolerated OXYGEN:  Home Oxygen: No.  Oxygen Delivery: room air DISCHARGE LOCATION:  [facility]  If you experience worsening of your admission symptoms, develop shortness of breath, life threatening emergency, suicidal or homicidal thoughts you must seek medical attention immediately by calling 911 or calling your MD immediately  if symptoms less severe.  You Must read complete instructions/literature along with all the possible adverse reactions/side effects for all the Medicines you take and that have been prescribed to you. Take any new Medicines after you have completely understood and accpet all the possible adverse reactions/side effects.   Please note  You were cared for by a hospitalist during your hospital stay. If you have any questions about your discharge medications or the care you received while you were in the hospital after you are discharged, you can call the unit and asked to speak with the hospitalist on call if the hospitalist that took care of you is not available. Once you are discharged, your primary care physician will handle any further medical issues. Please note that NO REFILLS for any discharge medications will be authorized once you are discharged, as it is imperative that you return to your primary care physician (or establish a relationship with a primary care physician if you do not have one) for your aftercare needs so that they can reassess your need for medications and monitor your lab values.    On the day of Discharge:  VITAL SIGNS:  Blood pressure (!) 150/60, pulse 82, temperature 98.8 F  (37.1 C), temperature source Axillary, resp. rate 17, height '5\' 6"'$  (1.676 m), weight 76.7 kg, SpO2 94 %. PHYSICAL EXAMINATION:  GENERAL:  87 y.o.-year-old patient lying in the bed with no acute distress.  EYES: Pupils equal, round, reactive to light and accommodation. No scleral icterus. Extraocular muscles intact.  HEENT: Head atraumatic, normocephalic. Oropharynx and nasopharynx clear.  NECK:  Supple, no jugular venous distention. No thyroid enlargement, no tenderness.  LUNGS: Normal breath sounds bilaterally, no wheezing, rales,rhonchi or crepitation. No use of accessory muscles of respiration.  CARDIOVASCULAR: S1, S2 normal. No murmurs, rubs, or gallops.  ABDOMEN: Soft, non-tender, non-distended. Bowel sounds present. No organomegaly or mass.  EXTREMITIES: moderate tenderness in anterior hip areas NEUROLOGIC: nonfocal - Severe dementia at baseline PSYCHIATRIC: keeps repeating - I don't have any accounts here SKIN: No obvious rash, lesion, or ulcer.  DATA REVIEW:   CBC Recent Labs  Lab 07/20/21 0543  WBC 7.5  HGB 9.7*  HCT 29.5*  PLT 189    Chemistries  Recent Labs  Lab 07/20/21 0543  NA 142  K 4.3  CL 108  CO2 27  GLUCOSE 130*  BUN 23  CREATININE 2.09*  CALCIUM 8.5*  Outpatient follow-up  Contact information for after-discharge care     Destination     [facility] Preferred SNF .   Service: Skilled Nursing Contact information: [address] [place] [phone]                        Management plans discussed with the patient, family and they are in agreement.  CODE STATUS: DNR  TOTAL TIME TAKING CARE OF THIS PATIENT: 45 minutes.    Max Sane M.D on 07/21/2021 at 9:22 AM  [facility]   CC: Primary care physician; Rica Koyanagi, MD   Note: This dictation was prepared with Dragon dictation along with smaller phrase technology. Any transcriptional errors that result from this  process are unintentional.

## 2021-07-21 NOTE — Plan of Care (Signed)
PMT note:  GOC consult in place. Per notes, patient is discharging back to her facility with hospice to follow. Goals appear set at this time. Please contact PMT for further needs.

## 2021-07-21 NOTE — Progress Notes (Signed)
Attempted to call report to [facility]

## 2021-07-22 DIAGNOSIS — W1789XA Other fall from one level to another, initial encounter: Secondary | ICD-10-CM | POA: Diagnosis not present

## 2021-07-22 DIAGNOSIS — M25552 Pain in left hip: Secondary | ICD-10-CM | POA: Diagnosis not present

## 2021-07-26 DIAGNOSIS — M25552 Pain in left hip: Secondary | ICD-10-CM | POA: Diagnosis not present

## 2021-07-26 DIAGNOSIS — E1122 Type 2 diabetes mellitus with diabetic chronic kidney disease: Secondary | ICD-10-CM | POA: Diagnosis not present

## 2021-07-26 DIAGNOSIS — I10 Essential (primary) hypertension: Secondary | ICD-10-CM | POA: Diagnosis not present

## 2021-07-27 ENCOUNTER — Other Ambulatory Visit: Payer: Self-pay

## 2021-07-27 ENCOUNTER — Non-Acute Institutional Stay: Payer: Medicare Other | Admitting: Primary Care

## 2021-07-27 ENCOUNTER — Non-Acute Institutional Stay: Admitting: Primary Care

## 2021-07-27 DIAGNOSIS — Z515 Encounter for palliative care: Secondary | ICD-10-CM

## 2021-07-27 DIAGNOSIS — R531 Weakness: Secondary | ICD-10-CM | POA: Diagnosis not present

## 2021-07-27 DIAGNOSIS — E785 Hyperlipidemia, unspecified: Secondary | ICD-10-CM

## 2021-07-27 DIAGNOSIS — E1169 Type 2 diabetes mellitus with other specified complication: Secondary | ICD-10-CM

## 2021-07-27 DIAGNOSIS — N189 Chronic kidney disease, unspecified: Secondary | ICD-10-CM | POA: Diagnosis not present

## 2021-07-27 DIAGNOSIS — R4189 Other symptoms and signs involving cognitive functions and awareness: Secondary | ICD-10-CM

## 2021-07-27 NOTE — Progress Notes (Signed)
[facility] Palliative Care Consult Note Telephone: [phone]  Fax: [phone]    Date of encounter: 07/27/21 12 pm PATIENT NAME: Denise [name] [place] [address]   [phone] (home)  DOB: 12-Jul-1930 MRN: [id number] PRIMARY CARE PROVIDER:    Rica Koyanagi, MD,  [place] [address] [phone]  REFERRING PROVIDER:   Rica Koyanagi, MD [address] [place],  [place] [address] [phone]  RESPONSIBLE PARTY:    Contact Information     Name Relation Home Work Mobile   [place] Daughter   [phone]        I met [facility] to face [facility] in [facility]. Palliative Care was asked to follow this patient by consultation request of  Rica Koyanagi, MD to address advance care planning and complex medical decision making. This is a follow up visit.                                   ASSESSMENT AND PLAN / RECOMMENDATIONS:   Advance Care Planning/Goals of Care: Goals include to maximize quality of life and symptom management.  CODE STATUS: Full code, will review with POA. Daughter called, no answer, message left  Symptom Management/Plan:  Patient back at SNF after a fall, fx of pelvis, admission to [facility] hospice per staff at [facility]. Daughter has since revoked per their report, and seeking more interventive care. I have visited patient who was already known to our practice.  She is sitting in a chair, endorses poor appetite. She has eaten only her ice cream. She denies pain but looks uncomfortable. She is a poor historian and does not remember being in the hospital, having a fall, etc. Asks me if I am going upstairs next.   GFR was slightly improved but still 22, CKD 4.  Imaging shows fx of pelvis.  I placed call to her daughter but no answer. Daughter had called over weekend thinking our agency was her hospice provider. Needs pain control for fx pelvic bone. Will continue to see for  palliative goals.   Follow up Palliative Care Visit: Palliative care will continue to follow for complex medical decision making, advance care planning, and clarification of goals. Return 2-4 weeks or prn.  I spent 25 minutes providing this consultation. More than 50% of the time in this consultation was spent in counseling and care coordination.   PPS: 30%  HOSPICE ELIGIBILITY/DIAGNOSIS: TBD  Chief Complaint: immobility, pain from fx  HISTORY OF PRESENT ILLNESS:  Denise [name] is a 87 y.o. year old female  with ckd 4, advanced dementia, recent fall trying to ambulate and pelvic fx, pain .   History obtained from review of EMR, discussion with primary team, and interview with family, facility staff/caregiver and/or Ms. Farabaugh.  I reviewed available labs, medications, imaging, studies and related documents from the EMR.  Records reviewed and summarized above.   ROS   General: NAD EYES: denies vision changes, has glasses ENMT: denies dysphagia Cardiovascular: denies chest pain, denies DOE Pulmonary: denies cough, denies increased SOB Abdomen: endorses poor appetite, denies constipation, endorses continence of bowel GU: denies dysuria, endorses continence of urine MSK:   endorses  weakness,  + falls reported Skin: denies rashes or wounds Neurological: endorses pain, denies insomnia Psych: Endorses  flat mood Heme/lymph/immuno: denies bruises, abnormal bleeding  Physical Exam: Current and past weights: 169 lbs Constitutional:  NAD General: frail appearing EYES: anicteric sclera, lids intact, no discharge  ENMT: intact hearing, oral mucous membranes moist, dentition intact CV: S1S2, RRR, slight  LE edema Pulmonary:  no increased work of breathing, no cough, room air Abdomen: intake 25%, normo-active BS + 4 quadrants, soft and non tender, no ascites GU: deferred MSK: mild sarcopenia, moves all extremities Skin: warm and dry, no rashes or wounds on visible skin Neuro:  ++  generalized weakness,  ++cognitive impairment Psych: non-anxious affect, A and O x 1 Hem/lymph/immuno: no widespread bruising   Thank you for the opportunity to participate in the care of Ms. Wold.  The palliative care team will continue to follow. Please call our office at [phone] if we can be of additional assistance.   Jason Coop, NP DNP, AGPCNP-BC  COVID-19 PATIENT SCREENING TOOL Asked and negative response unless otherwise noted:   Have you had symptoms of covid, tested positive or been in contact with someone with symptoms/positive test in the past 5-10 days?

## 2021-07-30 ENCOUNTER — Non-Acute Institutional Stay: Admitting: Primary Care

## 2021-08-06 DIAGNOSIS — M25552 Pain in left hip: Secondary | ICD-10-CM | POA: Diagnosis not present

## 2021-08-06 DIAGNOSIS — E1122 Type 2 diabetes mellitus with diabetic chronic kidney disease: Secondary | ICD-10-CM | POA: Diagnosis not present

## 2021-08-06 DIAGNOSIS — I10 Essential (primary) hypertension: Secondary | ICD-10-CM | POA: Diagnosis not present

## 2021-08-17 DIAGNOSIS — I130 Hypertensive heart and chronic kidney disease with heart failure and stage 1 through stage 4 chronic kidney disease, or unspecified chronic kidney disease: Secondary | ICD-10-CM | POA: Diagnosis not present

## 2021-08-17 DIAGNOSIS — R2681 Unsteadiness on feet: Secondary | ICD-10-CM | POA: Diagnosis not present

## 2021-08-17 DIAGNOSIS — E1151 Type 2 diabetes mellitus with diabetic peripheral angiopathy without gangrene: Secondary | ICD-10-CM | POA: Diagnosis not present

## 2021-08-17 DIAGNOSIS — E1351 Other specified diabetes mellitus with diabetic peripheral angiopathy without gangrene: Secondary | ICD-10-CM | POA: Diagnosis not present

## 2021-08-17 DIAGNOSIS — E1121 Type 2 diabetes mellitus with diabetic nephropathy: Secondary | ICD-10-CM | POA: Diagnosis not present

## 2021-08-17 DIAGNOSIS — E039 Hypothyroidism, unspecified: Secondary | ICD-10-CM | POA: Diagnosis not present

## 2021-08-17 DIAGNOSIS — E1122 Type 2 diabetes mellitus with diabetic chronic kidney disease: Secondary | ICD-10-CM | POA: Diagnosis not present

## 2021-08-17 DIAGNOSIS — E785 Hyperlipidemia, unspecified: Secondary | ICD-10-CM | POA: Diagnosis not present

## 2021-08-19 DIAGNOSIS — I10 Essential (primary) hypertension: Secondary | ICD-10-CM | POA: Diagnosis not present

## 2021-08-19 DIAGNOSIS — M25552 Pain in left hip: Secondary | ICD-10-CM | POA: Diagnosis not present

## 2021-08-19 DIAGNOSIS — E1122 Type 2 diabetes mellitus with diabetic chronic kidney disease: Secondary | ICD-10-CM | POA: Diagnosis not present

## 2021-08-19 DIAGNOSIS — L0231 Cutaneous abscess of buttock: Secondary | ICD-10-CM | POA: Diagnosis not present

## 2021-08-19 DIAGNOSIS — L89300 Pressure ulcer of unspecified buttock, unstageable: Secondary | ICD-10-CM | POA: Diagnosis not present

## 2021-08-20 ENCOUNTER — Telehealth: Payer: Self-pay

## 2021-08-20 IMAGING — US US EXTREM  UP VENOUS*R*
1 series · 13 of 24 positions shown · non-contrast
Comparison: None.

CLINICAL DATA: Right arm swelling and pain



[Series 1: us venous img upper uni right (dvt) · portal-venous · 34 acquisitions, 13 frames shown]
[im 1/34]
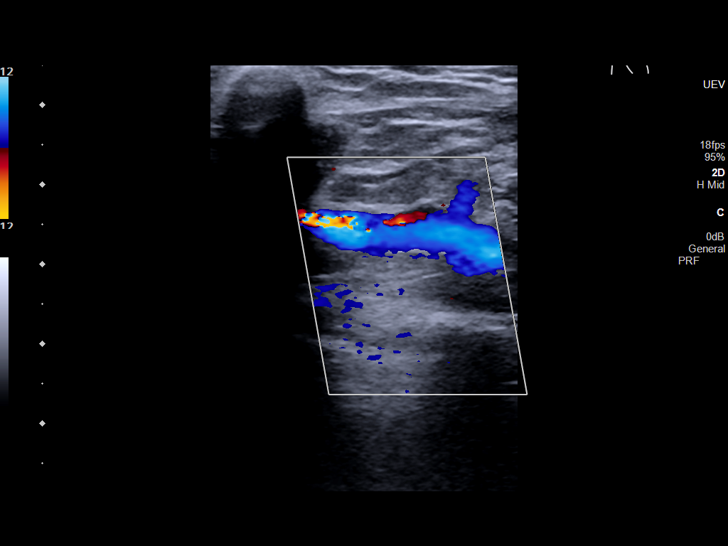
[im 3/34]
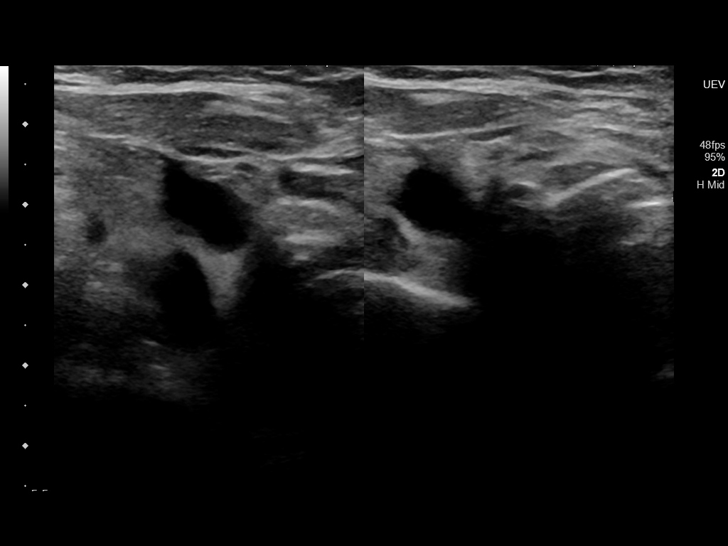
[im 6/34]
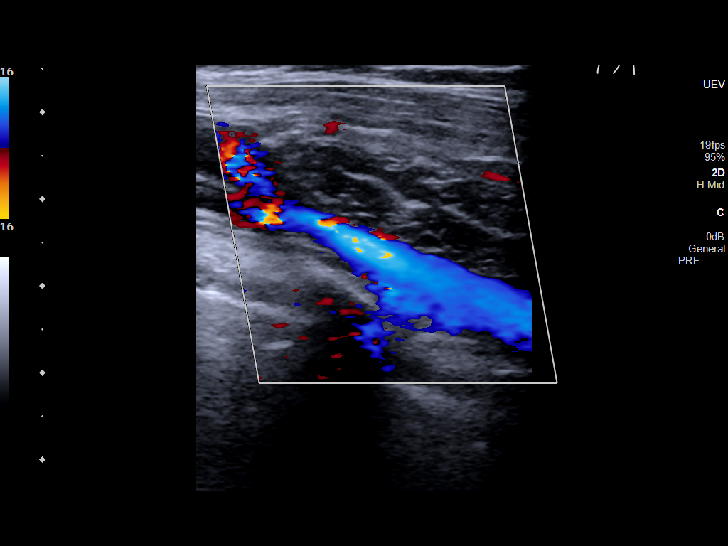
[im 9/34]
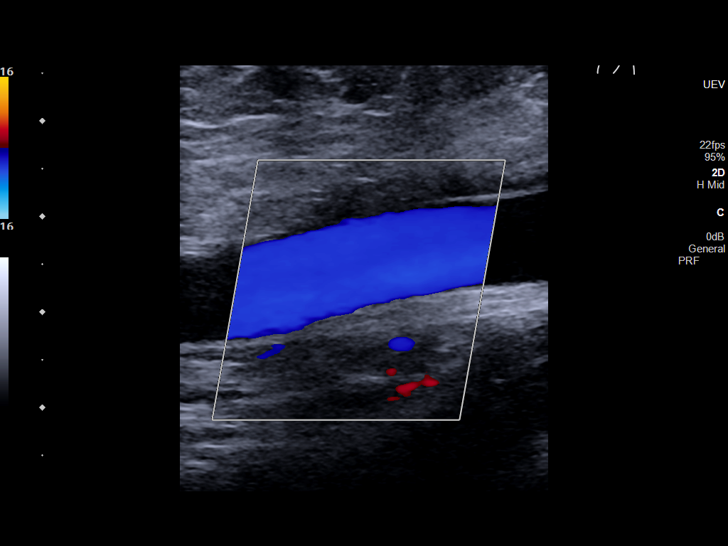
[im 12/34]
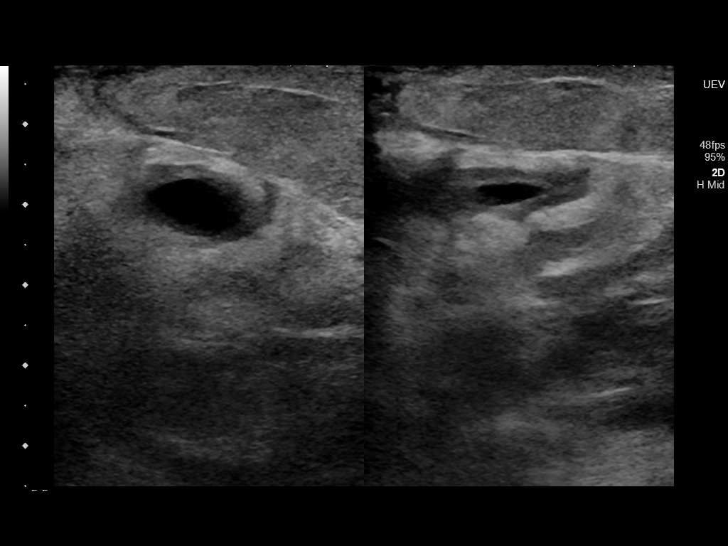
[im 15/34]
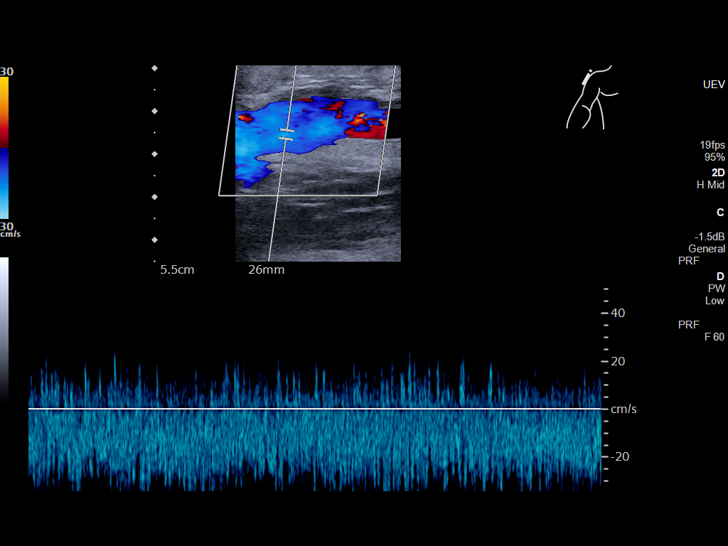
[im 19/34]
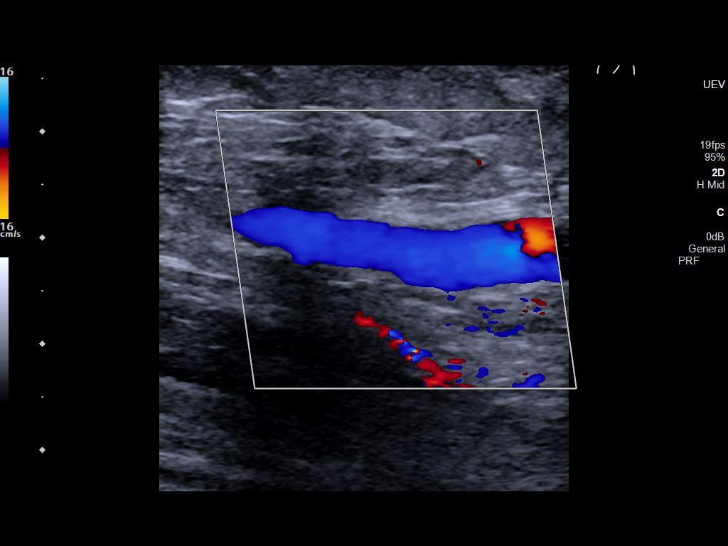
[im 21/34]
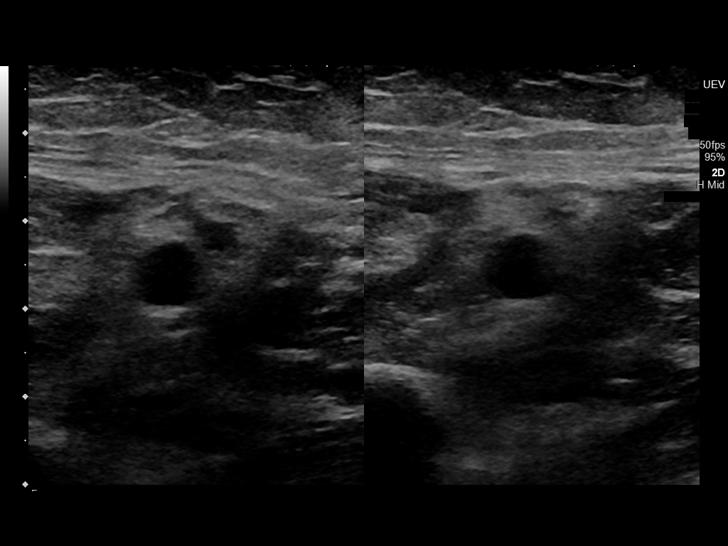
[im 23/34]
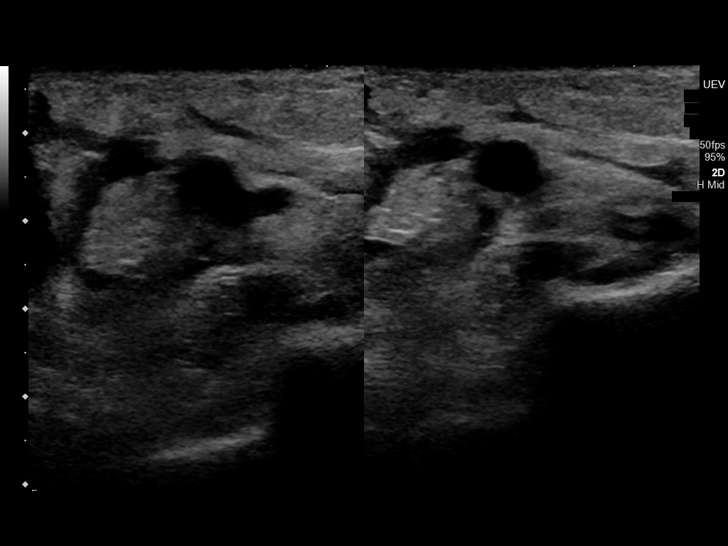
[im 26/34]
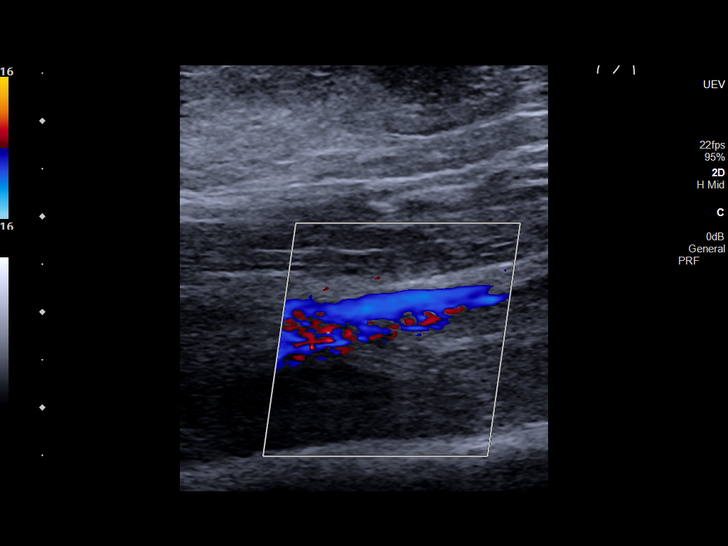
[im 29/34]
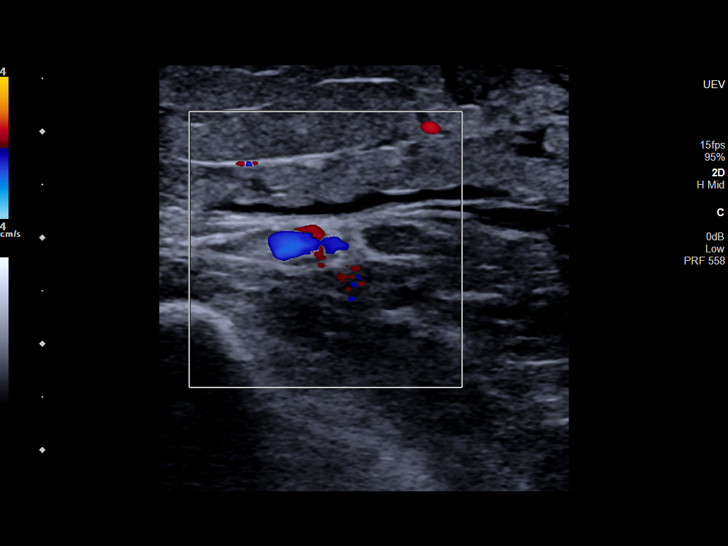
[im 32/34]
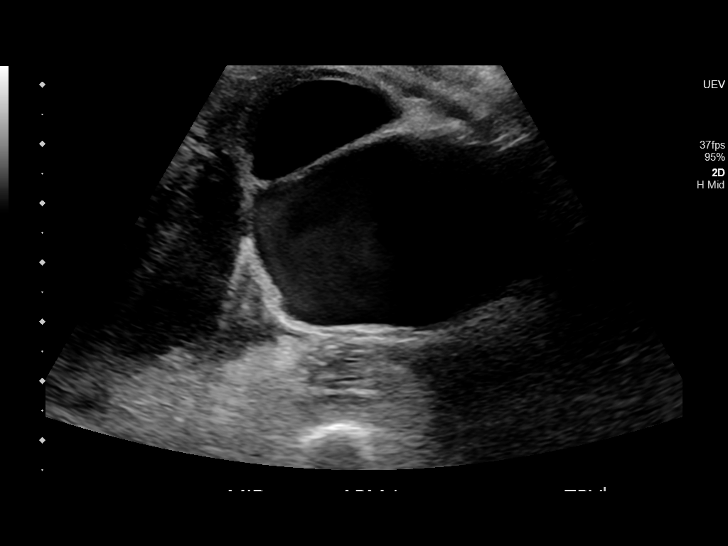
[im 34/34]
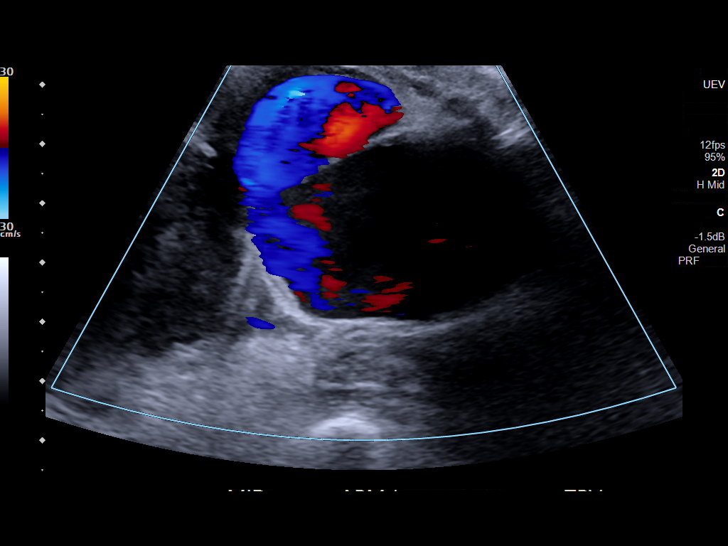

[13 of 24 positions shown; findings below may reference images not displayed]

FINDINGS: Contralateral Subclavian Vein: Respiratory phasicity is normal and
symmetric with the symptomatic side. No evidence of thrombus. Normal
compressibility.

Internal Jugular Vein: No evidence of thrombus. Normal
compressibility, respiratory phasicity and response to augmentation.

Subclavian Vein: No evidence of thrombus. Normal compressibility,
respiratory phasicity and response to augmentation.

Axillary Vein: No evidence of thrombus. Normal compressibility,
respiratory phasicity and response to augmentation.

Cephalic Vein: No evidence of thrombus. Normal compressibility,
respiratory phasicity and response to augmentation.

Basilic Vein: No evidence of thrombus. Normal compressibility,
respiratory phasicity and response to augmentation.

Brachial Veins: No evidence of thrombus. Normal compressibility,
respiratory phasicity and response to augmentation.

Radial Veins: No evidence of thrombus. Normal compressibility,
respiratory phasicity and response to augmentation.

Ulnar Veins: No evidence of thrombus. Normal compressibility,
respiratory phasicity and response to augmentation.

Other Findings: Mild upper extremity subcutaneous edema noted.
Redemonstration of a dilated patent aneurysmal dialysis AV fistula
in upper arm.
IMPRESSION: No evidence of DVT within the right upper extremity.

## 2021-08-20 NOTE — Telephone Encounter (Signed)
[facility] called needing a monitor for the patient . I got the address to order the patient a monitor.

## 2021-08-23 DIAGNOSIS — E559 Vitamin D deficiency, unspecified: Secondary | ICD-10-CM | POA: Diagnosis not present

## 2021-08-23 DIAGNOSIS — E1165 Type 2 diabetes mellitus with hyperglycemia: Secondary | ICD-10-CM | POA: Diagnosis not present

## 2021-08-23 DIAGNOSIS — I10 Essential (primary) hypertension: Secondary | ICD-10-CM | POA: Diagnosis not present

## 2021-08-23 IMAGING — US US RENAL
1 series · 14 of 25 positions shown · non-contrast
Comparison: CT 07/25/2015.

CLINICAL DATA: Acute renal insufficiency.

EXAM:
RENAL / URINARY TRACT ULTRASOUND COMPLETE

[Series 1: us renal · 14 of 45 slices shown]
[im 1/45]
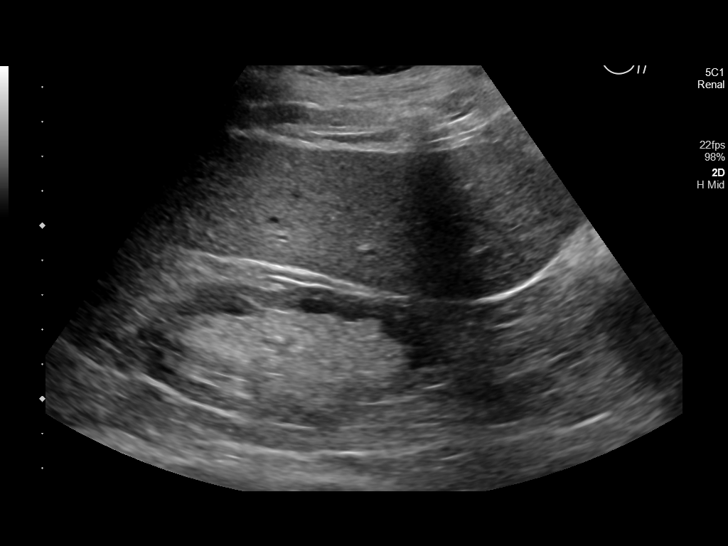
[im 4/45]
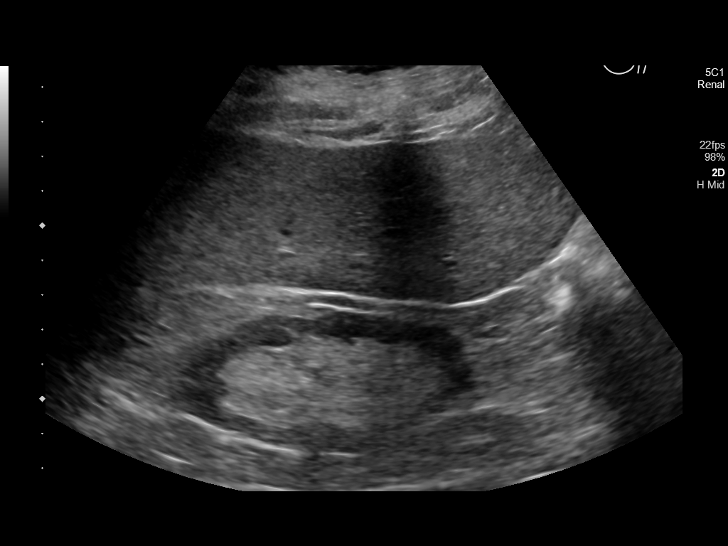
[im 8/45]
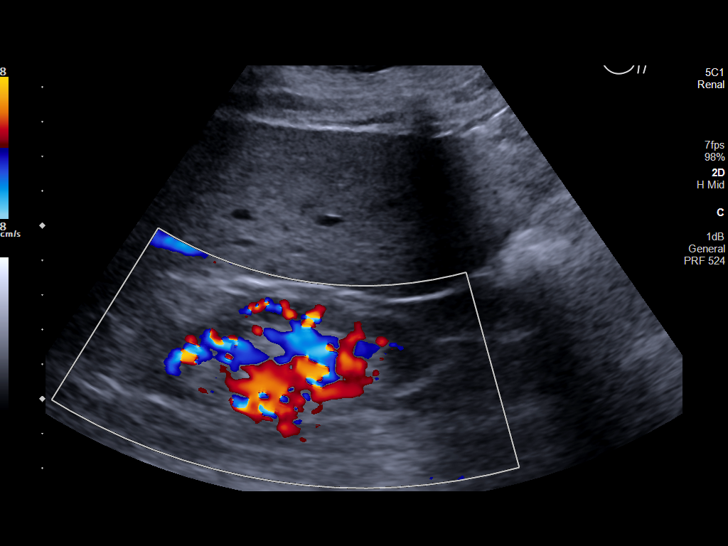
[im 12/45]
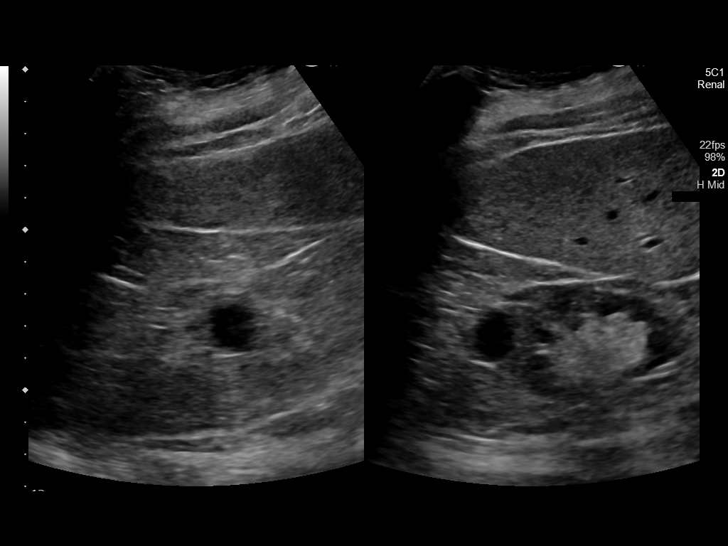
[im 15/45]
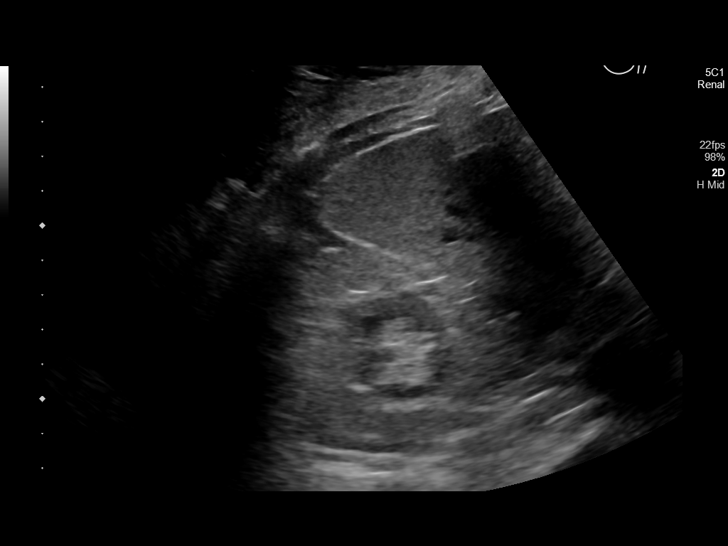
[im 17/45]
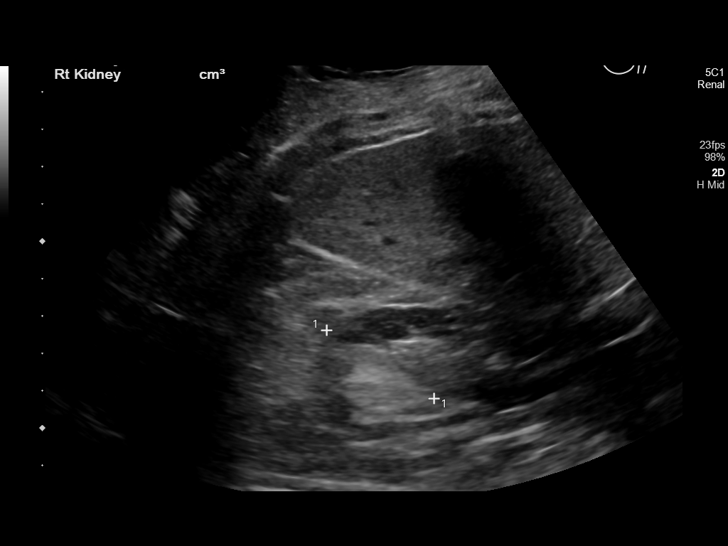
[im 21/45]
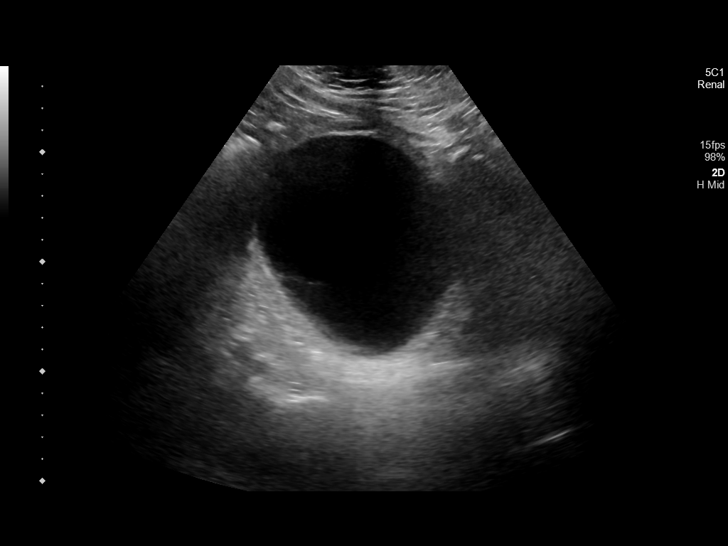
[im 24/45]
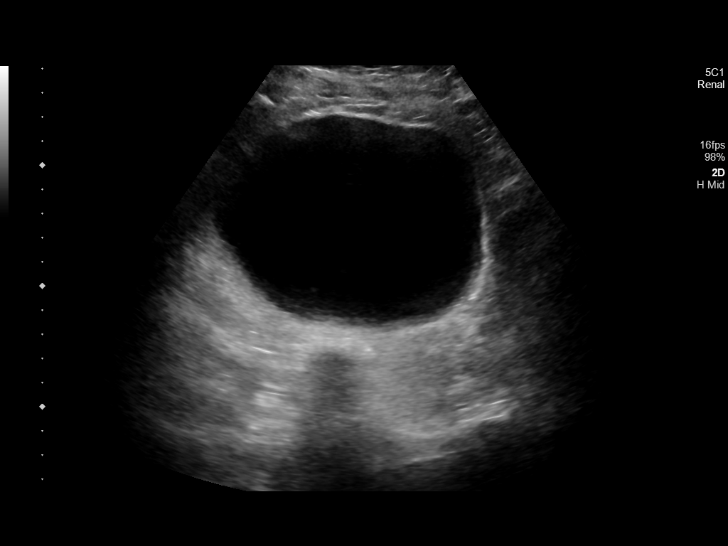
[im 28/45]
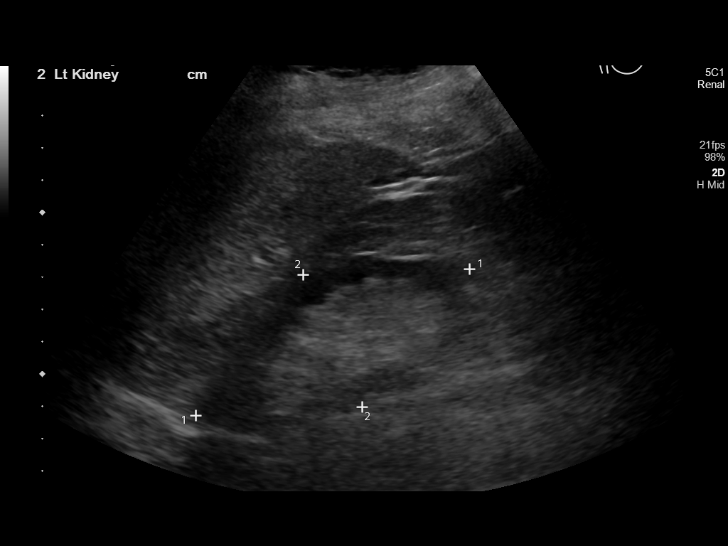
[im 30/45]
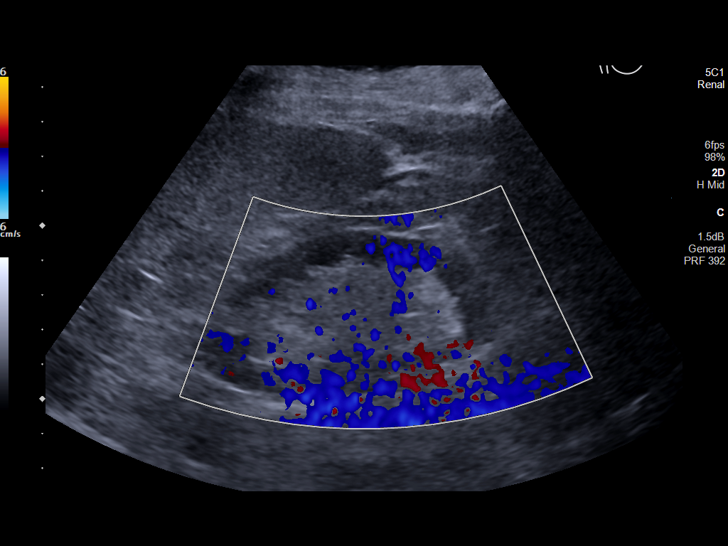
[im 34/45]
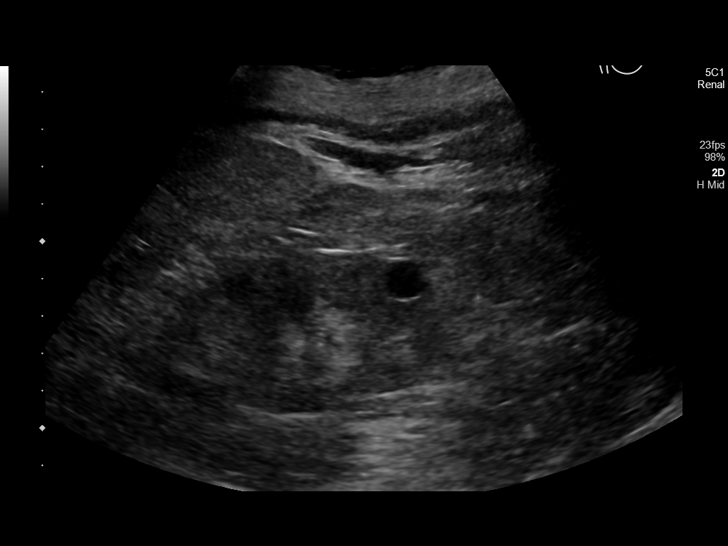
[im 37/45]
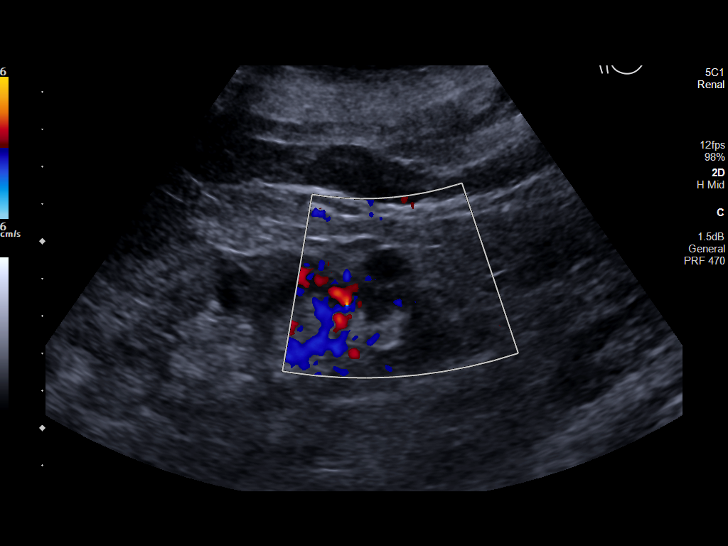
[im 41/45]
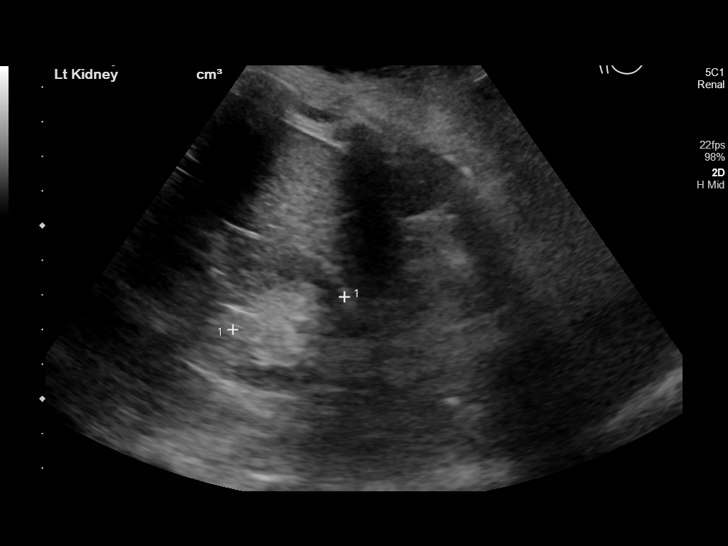
[im 45/45]
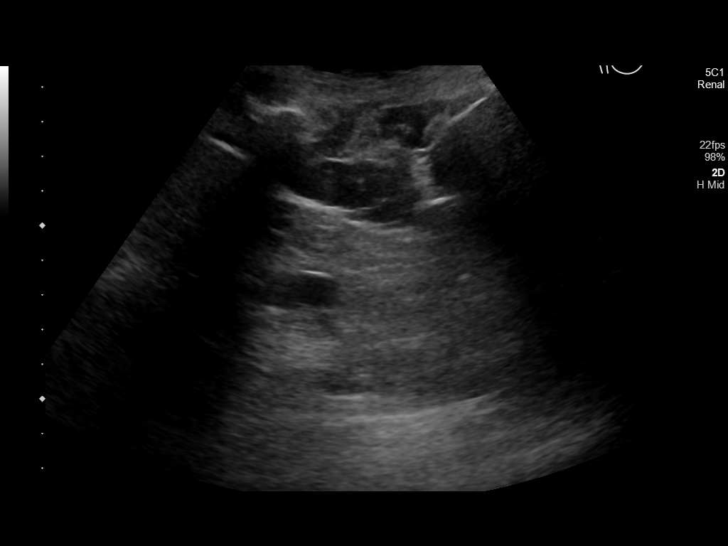

[14 of 25 positions shown; findings below may reference images not displayed]

FINDINGS: Right Kidney:

Renal measurements: 8.9 x 3.4 x 3.4 cm = volume: 54.6 mL. Cortical
thinning. Echogenicity within normal limits. 1.6 cm simple cyst. No
hydronephrosis visualized. Tiny amount of right perirenal fluid
cannot be excluded.

Left Kidney:

Renal measurements: 9.6 x 4.5 x 3.4 cm = volume: 75.7 mL. Cortical
thinning. Echogenicity within normal limits. 1.1 cm simple cyst. No
hydronephrosis visualized.

Bladder:

Appears normal for degree of bladder distention.

Other:

None.
IMPRESSION: 1. Bilateral renal cortical thinning. Tiny amount of right perirenal
fluid cannot be excluded. No hydronephrosis or bladder distention.

2.  Bilateral simple renal cysts.

## 2021-08-28 DIAGNOSIS — W19XXXA Unspecified fall, initial encounter: Secondary | ICD-10-CM | POA: Diagnosis not present

## 2021-08-28 DIAGNOSIS — M79631 Pain in right forearm: Secondary | ICD-10-CM | POA: Diagnosis not present

## 2021-08-28 DIAGNOSIS — M25521 Pain in right elbow: Secondary | ICD-10-CM | POA: Diagnosis not present

## 2021-08-30 ENCOUNTER — Ambulatory Visit (INDEPENDENT_AMBULATORY_CARE_PROVIDER_SITE_OTHER): Payer: Medicare Other

## 2021-08-30 DIAGNOSIS — M25552 Pain in left hip: Secondary | ICD-10-CM | POA: Diagnosis not present

## 2021-08-30 DIAGNOSIS — M79621 Pain in right upper arm: Secondary | ICD-10-CM | POA: Diagnosis not present

## 2021-08-30 DIAGNOSIS — L89300 Pressure ulcer of unspecified buttock, unstageable: Secondary | ICD-10-CM | POA: Diagnosis not present

## 2021-08-30 DIAGNOSIS — L0231 Cutaneous abscess of buttock: Secondary | ICD-10-CM | POA: Diagnosis not present

## 2021-08-30 DIAGNOSIS — M6281 Muscle weakness (generalized): Secondary | ICD-10-CM | POA: Diagnosis not present

## 2021-08-30 DIAGNOSIS — E1122 Type 2 diabetes mellitus with diabetic chronic kidney disease: Secondary | ICD-10-CM | POA: Diagnosis not present

## 2021-08-30 DIAGNOSIS — I480 Paroxysmal atrial fibrillation: Secondary | ICD-10-CM

## 2021-09-01 DIAGNOSIS — R52 Pain, unspecified: Secondary | ICD-10-CM | POA: Diagnosis not present

## 2021-09-01 DIAGNOSIS — F32A Depression, unspecified: Secondary | ICD-10-CM | POA: Diagnosis not present

## 2021-09-01 DIAGNOSIS — E569 Vitamin deficiency, unspecified: Secondary | ICD-10-CM | POA: Diagnosis not present

## 2021-09-01 DIAGNOSIS — K219 Gastro-esophageal reflux disease without esophagitis: Secondary | ICD-10-CM | POA: Diagnosis not present

## 2021-09-01 DIAGNOSIS — E1122 Type 2 diabetes mellitus with diabetic chronic kidney disease: Secondary | ICD-10-CM | POA: Diagnosis not present

## 2021-09-01 DIAGNOSIS — I480 Paroxysmal atrial fibrillation: Secondary | ICD-10-CM | POA: Diagnosis not present

## 2021-09-01 DIAGNOSIS — E039 Hypothyroidism, unspecified: Secondary | ICD-10-CM | POA: Diagnosis not present

## 2021-09-01 DIAGNOSIS — J449 Chronic obstructive pulmonary disease, unspecified: Secondary | ICD-10-CM | POA: Diagnosis not present

## 2021-09-01 DIAGNOSIS — E785 Hyperlipidemia, unspecified: Secondary | ICD-10-CM | POA: Diagnosis not present

## 2021-09-01 DIAGNOSIS — I10 Essential (primary) hypertension: Secondary | ICD-10-CM | POA: Diagnosis not present

## 2021-09-01 DIAGNOSIS — M1000 Idiopathic gout, unspecified site: Secondary | ICD-10-CM | POA: Diagnosis not present

## 2021-09-01 DIAGNOSIS — Z515 Encounter for palliative care: Secondary | ICD-10-CM | POA: Diagnosis not present

## 2021-09-01 DIAGNOSIS — N184 Chronic kidney disease, stage 4 (severe): Secondary | ICD-10-CM | POA: Diagnosis not present

## 2021-09-01 DIAGNOSIS — H04123 Dry eye syndrome of bilateral lacrimal glands: Secondary | ICD-10-CM | POA: Diagnosis not present

## 2021-09-01 DIAGNOSIS — S32592D Other specified fracture of left pubis, subsequent encounter for fracture with routine healing: Secondary | ICD-10-CM | POA: Diagnosis not present

## 2021-09-02 ENCOUNTER — Non-Acute Institutional Stay: Admitting: Primary Care

## 2021-09-02 ENCOUNTER — Other Ambulatory Visit: Payer: Self-pay

## 2021-09-03 ENCOUNTER — Non-Acute Institutional Stay: Admitting: Primary Care

## 2021-09-03 ENCOUNTER — Telehealth: Payer: Self-pay

## 2021-09-03 ENCOUNTER — Other Ambulatory Visit: Payer: Self-pay

## 2021-09-03 DIAGNOSIS — F32A Depression, unspecified: Secondary | ICD-10-CM | POA: Diagnosis not present

## 2021-09-03 DIAGNOSIS — R609 Edema, unspecified: Secondary | ICD-10-CM | POA: Diagnosis not present

## 2021-09-03 DIAGNOSIS — M1000 Idiopathic gout, unspecified site: Secondary | ICD-10-CM | POA: Diagnosis not present

## 2021-09-03 DIAGNOSIS — I10 Essential (primary) hypertension: Secondary | ICD-10-CM | POA: Diagnosis not present

## 2021-09-03 DIAGNOSIS — E569 Vitamin deficiency, unspecified: Secondary | ICD-10-CM | POA: Diagnosis not present

## 2021-09-03 DIAGNOSIS — Z515 Encounter for palliative care: Secondary | ICD-10-CM | POA: Diagnosis not present

## 2021-09-03 DIAGNOSIS — L0231 Cutaneous abscess of buttock: Secondary | ICD-10-CM | POA: Diagnosis not present

## 2021-09-03 DIAGNOSIS — R52 Pain, unspecified: Secondary | ICD-10-CM | POA: Diagnosis not present

## 2021-09-03 DIAGNOSIS — E039 Hypothyroidism, unspecified: Secondary | ICD-10-CM | POA: Diagnosis not present

## 2021-09-03 DIAGNOSIS — S32592D Other specified fracture of left pubis, subsequent encounter for fracture with routine healing: Secondary | ICD-10-CM | POA: Diagnosis not present

## 2021-09-03 DIAGNOSIS — I480 Paroxysmal atrial fibrillation: Secondary | ICD-10-CM | POA: Diagnosis not present

## 2021-09-03 DIAGNOSIS — M6281 Muscle weakness (generalized): Secondary | ICD-10-CM | POA: Diagnosis not present

## 2021-09-03 DIAGNOSIS — K219 Gastro-esophageal reflux disease without esophagitis: Secondary | ICD-10-CM | POA: Diagnosis not present

## 2021-09-03 DIAGNOSIS — E785 Hyperlipidemia, unspecified: Secondary | ICD-10-CM | POA: Diagnosis not present

## 2021-09-03 DIAGNOSIS — J449 Chronic obstructive pulmonary disease, unspecified: Secondary | ICD-10-CM | POA: Diagnosis not present

## 2021-09-03 DIAGNOSIS — H04123 Dry eye syndrome of bilateral lacrimal glands: Secondary | ICD-10-CM | POA: Diagnosis not present

## 2021-09-03 DIAGNOSIS — E1122 Type 2 diabetes mellitus with diabetic chronic kidney disease: Secondary | ICD-10-CM | POA: Diagnosis not present

## 2021-09-03 DIAGNOSIS — N184 Chronic kidney disease, stage 4 (severe): Secondary | ICD-10-CM | POA: Diagnosis not present

## 2021-09-03 LAB — CUP PACEART REMOTE DEVICE CHECK
Battery Remaining Longevity: 61 mo
Battery Remaining Percentage: 81 %
Battery Voltage: 2.99 V
Brady Statistic AP VP Percent: 1 %
Brady Statistic AP VS Percent: 73 %
Brady Statistic AS VP Percent: 1 %
Brady Statistic AS VS Percent: 23 %
Brady Statistic RA Percent Paced: 67 %
Brady Statistic RV Percent Paced: 1 %
Date Time Interrogation Session: 20221111020014
Implantable Lead Implant Date: 20210205
Implantable Lead Implant Date: 20210205
Implantable Lead Location: 753859
Implantable Lead Location: 753860
Implantable Pulse Generator Implant Date: 20210205
Lead Channel Impedance Value: 450 Ohm
Lead Channel Impedance Value: 510 Ohm
Lead Channel Pacing Threshold Amplitude: 0.5 V
Lead Channel Pacing Threshold Amplitude: 0.75 V
Lead Channel Pacing Threshold Pulse Width: 0.5 ms
Lead Channel Pacing Threshold Pulse Width: 0.5 ms
Lead Channel Sensing Intrinsic Amplitude: 12 mV
Lead Channel Sensing Intrinsic Amplitude: 2 mV
Lead Channel Setting Pacing Amplitude: 3.5 V
Lead Channel Setting Pacing Amplitude: 3.5 V
Lead Channel Setting Pacing Pulse Width: 0.5 ms
Lead Channel Setting Sensing Sensitivity: 2 mV
Pulse Gen Model: 2272
Pulse Gen Serial Number: 9199360

## 2021-09-03 NOTE — Telephone Encounter (Signed)
"  Scheduled remote reviewed. Normal device function.   First transmission since 5/9 Stored events for AF, one HVR 9/30 lasting 64min, appears SVT Histogram poorly controlled.  AF currently in progress from 11/10 @ 19:41, burden <1% ? Eliquis, no medications on MAR Next remote 91 days. Route to triage LR"  Discussed with Dr. Curt Bears who has reviewed remote and previously suggested patient be evaluated in clinic for RVR. Discussed with Dr. Curt Bears patient's current condition including pelvic fx from recent fall. Patient is engaged with [facility]. Last note documented by Palliative Care NP 07/27/21 indicates patient needs pain control and has poor appetite. If patient is eating well it is recommended by Dr. Curt Bears that patient begin Toprol XL 50 mg daily. Not on [place]. Eliquis discontinued 03/18/21 during hospitalization for spontaneous hematoma of AV fistula requiring surgery. Left message for Palliative Care to return call to discuss patient care. Spoke with Johny Blamer, unit nurse manager at [facility] where patient resides. Nurse manager states patient states patient is taking POs and is currently on Toprol XL 25 mg. Patient is able to be transported to medical appointments even though under hospice care per [facility]. Will route to scheduling for needed appointment as outputs need to be changed to chronic lead settings as well.

## 2021-09-05 NOTE — Progress Notes (Signed)
Cardiology Office Note Date:  09/05/2021  Patient ID:  Demmi, Sindt [birth year], MRN [id number] PCP:  Rica Koyanagi, MD  Electrophysiologist: Dr. Curt Bears    Chief Complaint: device check  History of Present Illness: Denise [name] is a 88 y.o. female with history of vertigo, AFib, tachy-brady w/PPM, COPD, DM, HTN, hypothyroidism, GERD, CKD (stage IV, has AV fistula)  She comes in to see Dr. Curt Bears, last seen by him during her hospital stay and PPM implant Feb 2021  She had a fall described as mechanical Sept 2022, hospitalized with a pelvic fracture, recommended non-operative management,  Eliquis was stopped 2/2 AVF hematoma, apparently spontaneous that required surgical intervention,  discharged 07/21/21 it appears on comfort care and all of her medicines were stopped except for pain management  Device remote noted episodes of afib w/RVR and called to come in, noted that she is with Palliative medicine, Dr. Curt Bears suggested if taking PO and eating OK to start toprol 25mg  daily   TODAY She is a resident at [facility] and comes today with Roselyn Reef, facility staff member. The patient is pleasantly confused Answers some of my questions with could be appropriate answers though is clearly confused She mentions that she has some back pain 4-5 times a year The staff member says she mentioned to her today some aching in her belly, last night apparently quite agitated and slept poorly if at all (the patient dozing off here) I am unable to get any clear symptoms from the patient Roselyn Reef had been more involved in her care prior to her last hospital stay On the hone I spoke with the nursing manager who reports the current mental status has been her new baseline since March hospital stay She confirms that she is established with Hospice but is not comfort care and is getting treated with her medicines.    Device information SJM dual chamber PPM implanted 11/29/2019   Past Medical  History:  Diagnosis Date   A-fib [facility])    Arthritis    Cataract    Chronic kidney disease    COPD (chronic obstructive pulmonary disease) ([place])    Diabetes mellitus without complication ([place])    Emphysema of lung ([place])    GERD (gastroesophageal reflux disease)    Hypertension    Hypothyroidism     Past Surgical History:  Procedure Laterality Date   AV FISTULA PLACEMENT Right 11/18/2015   Procedure: ARTERIOVENOUS (AV) FISTULA CREATION;  Surgeon: Algernon Huxley, MD;  Location: [facility] ORS;  Service: Vascular;  Laterality: Right;   CHOLECYSTECTOMY     GALLBLADDER SURGERY     HIP ARTHROPLASTY Right 07/31/2015   Procedure: ARTHROPLASTY BIPOLAR HIP (HEMIARTHROPLASTY);  Surgeon: Corky Mull, MD;  Location: [facility] ORS;  Service: Orthopedics;  Laterality: Right;   LIGATION OF ARTERIOVENOUS  FISTULA Right 03/14/2021   Procedure: LIGATION OF ARTERIOVENOUS  FISTULA  AND EVACUATION OF HEMATOMA;  Surgeon: Conrad Burkburnett, MD;  Location: [facility] ORS;  Service: Vascular;  Laterality: Right;   PACEMAKER IMPLANT N/A 11/29/2019   Procedure: PACEMAKER IMPLANT;  Surgeon: Constance Haw, MD;  Location: [place] CV LAB;  Service: Cardiovascular;  Laterality: N/A;   TEMPORARY PACEMAKER N/A 11/29/2019   Procedure: TEMPORARY PACEMAKER;  Surgeon: Isaias Cowman, MD;  Location: [place] CV LAB;  Service: Cardiovascular;  Laterality: N/A;    No current outpatient medications on file.   No current facility-administered medications for this visit.    Allergies:   Iodinated diagnostic agents, Benzocaine-chloroxylenol-hc, and  Benzonatate   Social History:  The patient  reports that she quit smoking about 8 years ago. She has never used smokeless tobacco. She reports that she does not drink alcohol and does not use drugs.   Family History:  The patient's family history includes Colon cancer in her brother and sister; Diabetes in her mother and sister; Heart attack in her brother and mother; Stomach cancer in  her father.  ROS:  Please see the history of present illness.    All other systems are reviewed and otherwise negative.   PHYSICAL EXAM:  VS:  There were no vitals taken for this visit. BMI: There is no height or weight on file to calculate BMI. Well nourished, well developed, in no acute distress HEENT: normocephalic, atraumatic, looks her age, chronically ill appearing Neck: no JVD, carotid bruits or masses Cardiac:  RRR; no significant murmurs, no rubs, or gallops Lungs:  CTA b/l, no wheezing, rhonchi or rales Abd: soft, nontender MS: no deformity or age appropriate atrophy Ext: trace edema Skin: warm and dry, no rash Neuro:  No gross deficits appreciated Psych: euthymic mood, full affect  PPM site is stable, no tethering or discomfort   EKG:  Done today and reviewed by myself shows  Uninterpreted secondary to marked artifact   Device interrogation done today and reviewed by myself:  Battery and lead measurements are good SB 50's underlying AF burden 1.1% AF rates are fast, longest, fastest and most recent are all the same event, 07/23/21, lasting 1min   11/29/2019: TTE IMPRESSIONS   1. Left ventricular ejection fraction, by visual estimation, is 60 to  65%. The left ventricle has normal function. There is mildly increased  left ventricular hypertrophy.   2. Left ventricular diastolic parameters are indeterminate.   3. Global right ventricle has normal systolic function.The right  ventricular size is normal.   4. Left atrial size was mildly dilated.   5. Right atrial size was moderately dilated.   6. Moderate mitral annular calcification.   7. The mitral valve is normal in structure. Mild mitral valve  regurgitation.   8. The tricuspid valve is normal in structure. Tricuspid valve  regurgitation is mild.   9. The aortic valve is tricuspid. Aortic valve regurgitation is not  visualized. Mild aortic valve sclerosis without stenosis.  10. The pulmonic valve was not well  visualized. Pulmonic valve  regurgitation is mild.  11. The tricuspid regurgitant velocity is 2.62 m/s, and with an assumed  right atrial pressure of 15 mmHg, the estimated right ventricular systolic  pressure is moderately elevated at 42.5 mmHg.  12. The inferior vena cava is dilated in size with <50% respiratory  variability, suggesting right atrial pressure of 15 mmHg.   Recent Labs: 03/16/2021: ALT 9 03/17/2021: Magnesium 2.2 07/20/2021: BUN 23; Creatinine, Ser 2.09; Hemoglobin 9.7; Platelets 189; Potassium 4.3; Sodium 142  No results found for requested labs within last 8760 hours.   CrCl cannot be calculated (Patient's most recent lab result is older than the maximum 21 days allowed.).   Wt Readings from Last 3 Encounters:  07/19/21 169 lb (76.7 kg)  03/11/21 170 lb (77.1 kg)  01/09/20 172 lb (78 kg)     Other studies reviewed: Additional studies/records reviewed today include: summarized above  ASSESSMENT AND PLAN:  PPM Intact function   Paroxysmal AFib Off a/c after her last hospitalization Had ?spontaneous AVF hematoma [place] 1.1% With RVR when she has AF with increase her diltiazem to 120mg  daily  ADDEND: same day Noted AFTER the patient left her diltiazem was 90mg  BID not daily as I thought.  I have tried to call her facility though unable to get a staff member in her ward (no answer) when transferred to taht area despite a number of attempts. I will ask my MA to follow up tomorrow to increase her diltiazem to 240mg  daily  HTN Looks OK  Disposition: F/u with remotes as usual, in clinic in 39mo, sooner if needed  Current medicines are reviewed at length with the patient today.  The patient did not have any concerns regarding medicines.  Venetia Night, PA-C 09/05/2021 5:05 PM     [address] [place] [address] [phone] (office)  [phone] (fax)

## 2021-09-06 DIAGNOSIS — E1122 Type 2 diabetes mellitus with diabetic chronic kidney disease: Secondary | ICD-10-CM | POA: Diagnosis not present

## 2021-09-06 DIAGNOSIS — S32592D Other specified fracture of left pubis, subsequent encounter for fracture with routine healing: Secondary | ICD-10-CM | POA: Diagnosis not present

## 2021-09-06 DIAGNOSIS — F32A Depression, unspecified: Secondary | ICD-10-CM | POA: Diagnosis not present

## 2021-09-06 DIAGNOSIS — J449 Chronic obstructive pulmonary disease, unspecified: Secondary | ICD-10-CM | POA: Diagnosis not present

## 2021-09-06 DIAGNOSIS — L0231 Cutaneous abscess of buttock: Secondary | ICD-10-CM | POA: Diagnosis not present

## 2021-09-06 DIAGNOSIS — K219 Gastro-esophageal reflux disease without esophagitis: Secondary | ICD-10-CM | POA: Diagnosis not present

## 2021-09-06 DIAGNOSIS — R609 Edema, unspecified: Secondary | ICD-10-CM | POA: Diagnosis not present

## 2021-09-06 DIAGNOSIS — I480 Paroxysmal atrial fibrillation: Secondary | ICD-10-CM | POA: Diagnosis not present

## 2021-09-06 DIAGNOSIS — N184 Chronic kidney disease, stage 4 (severe): Secondary | ICD-10-CM | POA: Diagnosis not present

## 2021-09-06 DIAGNOSIS — M1000 Idiopathic gout, unspecified site: Secondary | ICD-10-CM | POA: Diagnosis not present

## 2021-09-06 DIAGNOSIS — N390 Urinary tract infection, site not specified: Secondary | ICD-10-CM | POA: Diagnosis not present

## 2021-09-06 DIAGNOSIS — I5021 Acute systolic (congestive) heart failure: Secondary | ICD-10-CM | POA: Diagnosis not present

## 2021-09-06 DIAGNOSIS — I10 Essential (primary) hypertension: Secondary | ICD-10-CM | POA: Diagnosis not present

## 2021-09-06 DIAGNOSIS — E569 Vitamin deficiency, unspecified: Secondary | ICD-10-CM | POA: Diagnosis not present

## 2021-09-06 DIAGNOSIS — E785 Hyperlipidemia, unspecified: Secondary | ICD-10-CM | POA: Diagnosis not present

## 2021-09-06 DIAGNOSIS — E038 Other specified hypothyroidism: Secondary | ICD-10-CM | POA: Diagnosis not present

## 2021-09-06 DIAGNOSIS — E039 Hypothyroidism, unspecified: Secondary | ICD-10-CM | POA: Diagnosis not present

## 2021-09-06 DIAGNOSIS — Z515 Encounter for palliative care: Secondary | ICD-10-CM | POA: Diagnosis not present

## 2021-09-06 DIAGNOSIS — R52 Pain, unspecified: Secondary | ICD-10-CM | POA: Diagnosis not present

## 2021-09-06 DIAGNOSIS — H04123 Dry eye syndrome of bilateral lacrimal glands: Secondary | ICD-10-CM | POA: Diagnosis not present

## 2021-09-07 DIAGNOSIS — I480 Paroxysmal atrial fibrillation: Secondary | ICD-10-CM | POA: Diagnosis not present

## 2021-09-07 DIAGNOSIS — R609 Edema, unspecified: Secondary | ICD-10-CM | POA: Diagnosis not present

## 2021-09-07 DIAGNOSIS — E1122 Type 2 diabetes mellitus with diabetic chronic kidney disease: Secondary | ICD-10-CM | POA: Diagnosis not present

## 2021-09-07 DIAGNOSIS — L0231 Cutaneous abscess of buttock: Secondary | ICD-10-CM | POA: Diagnosis not present

## 2021-09-07 NOTE — Progress Notes (Signed)
Remote pacemaker transmission.   

## 2021-09-09 ENCOUNTER — Ambulatory Visit (INDEPENDENT_AMBULATORY_CARE_PROVIDER_SITE_OTHER): Payer: Medicare Other | Admitting: Physician Assistant

## 2021-09-09 ENCOUNTER — Other Ambulatory Visit: Payer: Self-pay

## 2021-09-09 ENCOUNTER — Encounter: Payer: Self-pay | Admitting: Physician Assistant

## 2021-09-09 VITALS — BP 110/60 | HR 60 | Ht 66.0 in | Wt 162.0 lb

## 2021-09-09 DIAGNOSIS — I480 Paroxysmal atrial fibrillation: Secondary | ICD-10-CM

## 2021-09-09 DIAGNOSIS — Z950 Presence of cardiac pacemaker: Secondary | ICD-10-CM

## 2021-09-09 DIAGNOSIS — I10 Essential (primary) hypertension: Secondary | ICD-10-CM

## 2021-09-09 DIAGNOSIS — I4891 Unspecified atrial fibrillation: Secondary | ICD-10-CM

## 2021-09-09 LAB — CUP PACEART INCLINIC DEVICE CHECK
Date Time Interrogation Session: 20221117232528
Implantable Lead Implant Date: 20210205
Implantable Lead Implant Date: 20210205
Implantable Lead Location: 753859
Implantable Lead Location: 753860
Implantable Pulse Generator Implant Date: 20210205
Lead Channel Pacing Threshold Amplitude: 0.75 V
Lead Channel Pacing Threshold Amplitude: 1 V
Lead Channel Pacing Threshold Pulse Width: 0.5 ms
Lead Channel Pacing Threshold Pulse Width: 0.5 ms
Lead Channel Sensing Intrinsic Amplitude: 11.9 mV
Lead Channel Sensing Intrinsic Amplitude: 3 mV
Pulse Gen Model: 2272
Pulse Gen Serial Number: 9199360

## 2021-09-09 MED ORDER — DILTIAZEM HCL 120 MG PO TABS: 120.0000 mg | ORAL_TABLET | Freq: Every day | ORAL | 1 refills | Status: DC

## 2021-09-09 NOTE — Patient Instructions (Addendum)
  Medication Instructions:    START TAKING DILTIAZEM 120 MG ONCE A DAY   *If you need a refill on your cardiac medications before your next appointment, please call your pharmacy*   Lab Work: NONE ORDERED  TODAY    If you have labs (blood work) drawn today and your tests are completely normal, you will receive your results only by: [place] (if you have MyChart) OR A paper copy in the mail If you have any lab test that is abnormal or we need to change your treatment, we will call you to review the results.   Testing/Procedures: NONE ORDERED  TODAY    Follow-Up: At [facility], you and your health needs are our priority.  As part of our continuing mission to provide you with exceptional heart care, we have created designated Provider Care Teams.  These Care Teams include your primary Cardiologist (physician) and Advanced Practice Providers (APPs -  Physician Assistants and Nurse Practitioners) who all work together to provide you with the care you need, when you need it.  We recommend signing up for the patient portal called "MyChart".  Sign up information is provided on this After Visit Summary.  MyChart is used to connect with patients for Virtual Visits (Telemedicine).  Patients are able to view lab/test results, encounter notes, upcoming appointments, etc.  Non-urgent messages can be sent to your provider as well.   To learn more about what you can do with MyChart, go to [email or web address].    Your next appointment:   6 month(s)  The format for your next appointment:   In Person  Provider:   You may see Will Meredith Leeds, MD or one of the following Advanced Practice Providers on your designated Care Team:   Tommye Standard, [place] Legrand Como "Jonni Sanger" Chalmers Cater, [place]    Other Instructions

## 2021-09-10 ENCOUNTER — Other Ambulatory Visit: Payer: Self-pay | Admitting: Physician Assistant

## 2021-09-10 DIAGNOSIS — R609 Edema, unspecified: Secondary | ICD-10-CM | POA: Diagnosis not present

## 2021-09-10 DIAGNOSIS — L0231 Cutaneous abscess of buttock: Secondary | ICD-10-CM | POA: Diagnosis not present

## 2021-09-10 DIAGNOSIS — R2243 Localized swelling, mass and lump, lower limb, bilateral: Secondary | ICD-10-CM | POA: Diagnosis not present

## 2021-09-10 DIAGNOSIS — I480 Paroxysmal atrial fibrillation: Secondary | ICD-10-CM | POA: Diagnosis not present

## 2021-09-10 MED ORDER — DILTIAZEM HCL ER COATED BEADS 240 MG PO CP24: 240.0000 mg | ORAL_CAPSULE | Freq: Every day | ORAL | 10 refills | Status: DC

## 2021-09-10 MED ORDER — DILTIAZEM HCL ER COATED BEADS 240 MG PO CP24: 240.0000 mg | ORAL_CAPSULE | Freq: Every day | ORAL | 11 refills | Status: DC

## 2021-09-10 NOTE — Addendum Note (Signed)
Addended by: France Ravens on: 09/10/2021 05:06 PM   Modules accepted: Orders

## 2021-09-10 NOTE — Progress Notes (Signed)
Corrected Cardizem prescription sent to [facility], [place] Spoke to [employer], LPN  Tommye Standard, [place]

## 2021-09-14 ENCOUNTER — Emergency Department: Payer: Medicare Other

## 2021-09-14 ENCOUNTER — Inpatient Hospital Stay
Admission: EM | Admit: 2021-09-14 | Discharge: 2021-09-23 | DRG: 637 | Disposition: A | Discharge status: Hospice | Payer: Medicare Other | Source: Skilled Nursing Facility | Attending: Internal Medicine | Admitting: Internal Medicine

## 2021-09-14 DIAGNOSIS — I129 Hypertensive chronic kidney disease with stage 1 through stage 4 chronic kidney disease, or unspecified chronic kidney disease: Secondary | ICD-10-CM | POA: Diagnosis not present

## 2021-09-14 DIAGNOSIS — Z043 Encounter for examination and observation following other accident: Secondary | ICD-10-CM | POA: Diagnosis not present

## 2021-09-14 DIAGNOSIS — Y848 Other medical procedures as the cause of abnormal reaction of the patient, or of later complication, without mention of misadventure at the time of the procedure: Secondary | ICD-10-CM | POA: Diagnosis present

## 2021-09-14 DIAGNOSIS — Z79890 Hormone replacement therapy (postmenopausal): Secondary | ICD-10-CM

## 2021-09-14 DIAGNOSIS — R410 Disorientation, unspecified: Secondary | ICD-10-CM | POA: Diagnosis present

## 2021-09-14 DIAGNOSIS — Z87891 Personal history of nicotine dependence: Secondary | ICD-10-CM

## 2021-09-14 DIAGNOSIS — S32512A Fracture of superior rim of left pubis, initial encounter for closed fracture: Secondary | ICD-10-CM | POA: Diagnosis not present

## 2021-09-14 DIAGNOSIS — J9811 Atelectasis: Secondary | ICD-10-CM | POA: Diagnosis not present

## 2021-09-14 DIAGNOSIS — Z20822 Contact with and (suspected) exposure to covid-19: Secondary | ICD-10-CM | POA: Diagnosis not present

## 2021-09-14 DIAGNOSIS — Z833 Family history of diabetes mellitus: Secondary | ICD-10-CM

## 2021-09-14 DIAGNOSIS — R32 Unspecified urinary incontinence: Secondary | ICD-10-CM | POA: Diagnosis present

## 2021-09-14 DIAGNOSIS — M47812 Spondylosis without myelopathy or radiculopathy, cervical region: Secondary | ICD-10-CM | POA: Diagnosis not present

## 2021-09-14 DIAGNOSIS — F0284 Dementia in other diseases classified elsewhere, unspecified severity, with anxiety: Secondary | ICD-10-CM | POA: Diagnosis not present

## 2021-09-14 DIAGNOSIS — E860 Dehydration: Secondary | ICD-10-CM | POA: Diagnosis not present

## 2021-09-14 DIAGNOSIS — S0003XA Contusion of scalp, initial encounter: Secondary | ICD-10-CM | POA: Diagnosis not present

## 2021-09-14 DIAGNOSIS — X58XXXD Exposure to other specified factors, subsequent encounter: Secondary | ICD-10-CM | POA: Diagnosis not present

## 2021-09-14 DIAGNOSIS — L539 Erythematous condition, unspecified: Secondary | ICD-10-CM | POA: Diagnosis not present

## 2021-09-14 DIAGNOSIS — F03918 Unspecified dementia, unspecified severity, with other behavioral disturbance: Secondary | ICD-10-CM | POA: Diagnosis not present

## 2021-09-14 DIAGNOSIS — M96A3 Multiple fractures of ribs associated with chest compression and cardiopulmonary resuscitation: Secondary | ICD-10-CM | POA: Diagnosis present

## 2021-09-14 DIAGNOSIS — R0600 Dyspnea, unspecified: Secondary | ICD-10-CM | POA: Diagnosis not present

## 2021-09-14 DIAGNOSIS — N179 Acute kidney failure, unspecified: Secondary | ICD-10-CM | POA: Diagnosis present

## 2021-09-14 DIAGNOSIS — S32302A Unspecified fracture of left ilium, initial encounter for closed fracture: Secondary | ICD-10-CM | POA: Diagnosis not present

## 2021-09-14 DIAGNOSIS — Z96641 Presence of right artificial hip joint: Secondary | ICD-10-CM | POA: Diagnosis present

## 2021-09-14 DIAGNOSIS — E872 Acidosis, unspecified: Secondary | ICD-10-CM | POA: Diagnosis present

## 2021-09-14 DIAGNOSIS — Z794 Long term (current) use of insulin: Secondary | ICD-10-CM

## 2021-09-14 DIAGNOSIS — E1122 Type 2 diabetes mellitus with diabetic chronic kidney disease: Secondary | ICD-10-CM | POA: Diagnosis present

## 2021-09-14 DIAGNOSIS — Z8249 Family history of ischemic heart disease and other diseases of the circulatory system: Secondary | ICD-10-CM

## 2021-09-14 DIAGNOSIS — N184 Chronic kidney disease, stage 4 (severe): Secondary | ICD-10-CM | POA: Diagnosis present

## 2021-09-14 DIAGNOSIS — J439 Emphysema, unspecified: Secondary | ICD-10-CM | POA: Diagnosis not present

## 2021-09-14 DIAGNOSIS — Y92129 Unspecified place in nursing home as the place of occurrence of the external cause: Secondary | ICD-10-CM

## 2021-09-14 DIAGNOSIS — S0083XA Contusion of other part of head, initial encounter: Secondary | ICD-10-CM | POA: Diagnosis present

## 2021-09-14 DIAGNOSIS — Z950 Presence of cardiac pacemaker: Secondary | ICD-10-CM

## 2021-09-14 DIAGNOSIS — I447 Left bundle-branch block, unspecified: Secondary | ICD-10-CM | POA: Diagnosis present

## 2021-09-14 DIAGNOSIS — Z91041 Radiographic dye allergy status: Secondary | ICD-10-CM

## 2021-09-14 DIAGNOSIS — T68XXXA Hypothermia, initial encounter: Secondary | ICD-10-CM | POA: Diagnosis present

## 2021-09-14 DIAGNOSIS — Z66 Do not resuscitate: Secondary | ICD-10-CM | POA: Diagnosis present

## 2021-09-14 DIAGNOSIS — M4312 Spondylolisthesis, cervical region: Secondary | ICD-10-CM | POA: Diagnosis not present

## 2021-09-14 DIAGNOSIS — S2243XA Multiple fractures of ribs, bilateral, initial encounter for closed fracture: Secondary | ICD-10-CM | POA: Diagnosis not present

## 2021-09-14 DIAGNOSIS — K219 Gastro-esophageal reflux disease without esophagitis: Secondary | ICD-10-CM | POA: Diagnosis not present

## 2021-09-14 DIAGNOSIS — Z9049 Acquired absence of other specified parts of digestive tract: Secondary | ICD-10-CM

## 2021-09-14 DIAGNOSIS — E162 Hypoglycemia, unspecified: Secondary | ICD-10-CM | POA: Diagnosis not present

## 2021-09-14 DIAGNOSIS — I517 Cardiomegaly: Secondary | ICD-10-CM | POA: Diagnosis not present

## 2021-09-14 DIAGNOSIS — G9341 Metabolic encephalopathy: Secondary | ICD-10-CM | POA: Diagnosis not present

## 2021-09-14 DIAGNOSIS — A419 Sepsis, unspecified organism: Secondary | ICD-10-CM | POA: Diagnosis present

## 2021-09-14 DIAGNOSIS — N189 Chronic kidney disease, unspecified: Secondary | ICD-10-CM | POA: Diagnosis not present

## 2021-09-14 DIAGNOSIS — I480 Paroxysmal atrial fibrillation: Secondary | ICD-10-CM | POA: Diagnosis not present

## 2021-09-14 DIAGNOSIS — Z888 Allergy status to other drugs, medicaments and biological substances status: Secondary | ICD-10-CM

## 2021-09-14 DIAGNOSIS — E1165 Type 2 diabetes mellitus with hyperglycemia: Secondary | ICD-10-CM | POA: Diagnosis not present

## 2021-09-14 DIAGNOSIS — E039 Hypothyroidism, unspecified: Secondary | ICD-10-CM | POA: Diagnosis not present

## 2021-09-14 DIAGNOSIS — Z9181 History of falling: Secondary | ICD-10-CM

## 2021-09-14 DIAGNOSIS — I10 Essential (primary) hypertension: Secondary | ICD-10-CM | POA: Diagnosis not present

## 2021-09-14 DIAGNOSIS — N281 Cyst of kidney, acquired: Secondary | ICD-10-CM | POA: Diagnosis not present

## 2021-09-14 DIAGNOSIS — R6521 Severe sepsis with septic shock: Secondary | ICD-10-CM | POA: Diagnosis not present

## 2021-09-14 DIAGNOSIS — G309 Alzheimer's disease, unspecified: Secondary | ICD-10-CM | POA: Diagnosis present

## 2021-09-14 DIAGNOSIS — R578 Other shock: Secondary | ICD-10-CM | POA: Diagnosis not present

## 2021-09-14 DIAGNOSIS — Z800 Family history of malignant neoplasm of digestive organs: Secondary | ICD-10-CM

## 2021-09-14 DIAGNOSIS — R4182 Altered mental status, unspecified: Secondary | ICD-10-CM | POA: Diagnosis not present

## 2021-09-14 DIAGNOSIS — F05 Delirium due to known physiological condition: Secondary | ICD-10-CM | POA: Diagnosis present

## 2021-09-14 DIAGNOSIS — F02811 Dementia in other diseases classified elsewhere, unspecified severity, with agitation: Secondary | ICD-10-CM | POA: Diagnosis present

## 2021-09-14 DIAGNOSIS — E11649 Type 2 diabetes mellitus with hypoglycemia without coma: Principal | ICD-10-CM | POA: Diagnosis present

## 2021-09-14 DIAGNOSIS — W19XXXA Unspecified fall, initial encounter: Secondary | ICD-10-CM | POA: Diagnosis present

## 2021-09-14 DIAGNOSIS — Z79899 Other long term (current) drug therapy: Secondary | ICD-10-CM

## 2021-09-14 DIAGNOSIS — Z515 Encounter for palliative care: Secondary | ICD-10-CM

## 2021-09-14 DIAGNOSIS — S329XXD Fracture of unspecified parts of lumbosacral spine and pelvis, subsequent encounter for fracture with routine healing: Secondary | ICD-10-CM

## 2021-09-14 DIAGNOSIS — J441 Chronic obstructive pulmonary disease with (acute) exacerbation: Secondary | ICD-10-CM | POA: Diagnosis not present

## 2021-09-14 LAB — CBC WITH DIFFERENTIAL/PLATELET
Abs Immature Granulocytes: 0.04 10*3/uL (ref 0.00–0.07)
Basophils Absolute: 0 10*3/uL (ref 0.0–0.1)
Basophils Relative: 1 %
Eosinophils Absolute: 0 10*3/uL (ref 0.0–0.5)
Eosinophils Relative: 0 %
HCT: 29.2 % — ABNORMAL LOW (ref 36.0–46.0)
Hemoglobin: 9.2 g/dL — ABNORMAL LOW (ref 12.0–15.0)
Immature Granulocytes: 1 %
Lymphocytes Relative: 9 %
Lymphs Abs: 0.6 10*3/uL — ABNORMAL LOW (ref 0.7–4.0)
MCH: 29.5 pg (ref 26.0–34.0)
MCHC: 31.5 g/dL (ref 30.0–36.0)
MCV: 93.6 fL (ref 80.0–100.0)
Monocytes Absolute: 0.4 10*3/uL (ref 0.1–1.0)
Monocytes Relative: 6 %
Neutro Abs: 5 10*3/uL (ref 1.7–7.7)
Neutrophils Relative %: 83 %
Platelets: 201 10*3/uL (ref 150–400)
RBC: 3.12 MIL/uL — ABNORMAL LOW (ref 3.87–5.11)
RDW: 19 % — ABNORMAL HIGH (ref 11.5–15.5)
WBC: 6.1 10*3/uL (ref 4.0–10.5)
nRBC: 0 % (ref 0.0–0.2)

## 2021-09-14 LAB — BASIC METABOLIC PANEL
Anion gap: 7 (ref 5–15)
BUN: 58 mg/dL — ABNORMAL HIGH (ref 8–23)
CO2: 20 mmol/L — ABNORMAL LOW (ref 22–32)
Calcium: 8.4 mg/dL — ABNORMAL LOW (ref 8.9–10.3)
Chloride: 117 mmol/L — ABNORMAL HIGH (ref 98–111)
Creatinine, Ser: 3.86 mg/dL — ABNORMAL HIGH (ref 0.44–1.00)
GFR, Estimated: 11 mL/min — ABNORMAL LOW (ref >60)
Glucose, Bld: 41 mg/dL — CL (ref 70–99)
Potassium: 4 mmol/L (ref 3.5–5.1)
Sodium: 144 mmol/L (ref 135–145)

## 2021-09-14 LAB — CBG MONITORING, ED
Glucose-Capillary: 194 mg/dL — ABNORMAL HIGH (ref 70–99)
Glucose-Capillary: 31 mg/dL — CL (ref 70–99)
Glucose-Capillary: 74 mg/dL (ref 70–99)
Glucose-Capillary: 152 mg/dL — ABNORMAL HIGH (ref 70–99)

## 2021-09-14 LAB — LACTIC ACID, PLASMA: Lactic Acid, Venous: 0.7 mmol/L (ref 0.5–1.9)

## 2021-09-14 LAB — RESP PANEL BY RT-PCR (FLU A&B, COVID) ARPGX2
Influenza A by PCR: NEGATIVE
Influenza B by PCR: NEGATIVE
SARS Coronavirus 2 by RT PCR: NEGATIVE

## 2021-09-14 LAB — TROPONIN I (HIGH SENSITIVITY): Troponin I (High Sensitivity): 34 ng/L — ABNORMAL HIGH (ref <18)

## 2021-09-14 MED ORDER — VANCOMYCIN HCL IN DEXTROSE 1-5 GM/200ML-% IV SOLN
1000.0000 mg | Freq: Once | INTRAVENOUS | Status: AC
  Administered 2021-09-14: 1000 mg via INTRAVENOUS
  Filled 2021-09-14: qty 200

## 2021-09-14 MED ORDER — DEXTROSE-NACL 5-0.9 % IV SOLN: INTRAVENOUS | Status: DC

## 2021-09-14 MED ORDER — SODIUM CHLORIDE 0.9 % IV SOLN
2.0000 g | Freq: Once | INTRAVENOUS | Status: AC
  Administered 2021-09-14: 2 g via INTRAVENOUS
  Filled 2021-09-14: qty 2

## 2021-09-14 MED ORDER — DEXTROSE 50 % IV SOLN
1.0000 | Freq: Once | INTRAVENOUS | Status: AC
  Administered 2021-09-14: 50 mL via INTRAVENOUS
  Filled 2021-09-14: qty 50

## 2021-09-14 MED ORDER — DEXTROSE-NACL 5-0.45 % IV SOLN: INTRAVENOUS | Status: DC

## 2021-09-14 MED ORDER — SODIUM CHLORIDE 0.9 % IV BOLUS
1000.0000 mL | Freq: Once | INTRAVENOUS | Status: AC
  Administered 2021-09-14: 1000 mL via INTRAVENOUS

## 2021-09-14 NOTE — ED Triage Notes (Addendum)
[facility] came [facility] , for hypoglycemia. Per report  pt was found by staff faced down to table , brought to room and CPR initially initiated by SNF staff. On EMS arrival pt found w/ pulse and breathing, BGL-30's. D50 I amp given .  Rpt BGL-110, ...107.  EMS reported and per pt's facility med record received insulin Humalog 8units at 4PM earlier today

## 2021-09-14 NOTE — ED Notes (Signed)
Pt to radiology for CT at this time

## 2021-09-14 NOTE — ED Provider Notes (Signed)
[facility] Emergency Department Provider Note   ____________________________________________   I have reviewed the triage vital signs and the nursing notes.   HISTORY  Chief Complaint Hypoglycemia   History limited by and level 5 caveat due to: AMS   HPI Denise [name] is a 87 y.o. female who presents to the emergency department today from nursing home after being found slumped over on the table.  The patient is unable to give any history.  Apparently when the nursing staff found her they initiated CPR although it is unclear if the checked for pulses.  When EMS arrived the patient did have a pulse but was found to be significantly hypoglycemic.  The patient was given dextrose by EMS.  Per documentation patient had received her insulin today.  Records reviewed. Per medical record review patient has a history of COPD, DM.   Past Medical History:  Diagnosis Date   A-fib [facility])    Arthritis    Cataract    Chronic kidney disease    COPD (chronic obstructive pulmonary disease) ([place])    Diabetes mellitus without complication ([facility])    Emphysema of lung ([facility])    GERD (gastroesophageal reflux disease)    Hypertension    Hypothyroidism     Patient Active Problem List   Diagnosis Date Noted   Closed fracture of left inferior pubic ramus ([place]) 07/19/2021   CKD (chronic kidney disease), stage IV ([place]) 07/19/2021   Essential hypertension 07/19/2021   Insomnia    Acute kidney injury superimposed on CKD ([place])    Acute sinusitis    Weakness    Left arm swelling    Intramural hematoma of artery of right upper extremity 03/12/2021   Hematoma    Anemia in chronic kidney disease 05/06/2020   Sinus pause 11/30/2019   PAF (paroxysmal atrial fibrillation) ([place]) 11/30/2019   Atrial fibrillation with RVR ([place]) 11/28/2019   ESRD on dialysis ([place]) 04/11/2017   Hyperlipidemia 01/05/2017   Type 2 diabetes mellitus with hyperlipidemia ([place]) 01/05/2017   Chronic kidney  disease 01/05/2017   Intertrochanteric fracture of right hip ([place]) 08/03/2015   Hypoglycemia secondary to sulfonylurea 07/29/2015    Past Surgical History:  Procedure Laterality Date   AV FISTULA PLACEMENT Right 11/18/2015   Procedure: ARTERIOVENOUS (AV) FISTULA CREATION;  Surgeon: Algernon Huxley, MD;  Location: [facility] ORS;  Service: Vascular;  Laterality: Right;   CHOLECYSTECTOMY     GALLBLADDER SURGERY     HIP ARTHROPLASTY Right 07/31/2015   Procedure: ARTHROPLASTY BIPOLAR HIP (HEMIARTHROPLASTY);  Surgeon: Corky Mull, MD;  Location: [facility] ORS;  Service: Orthopedics;  Laterality: Right;   LIGATION OF ARTERIOVENOUS  FISTULA Right 03/14/2021   Procedure: LIGATION OF ARTERIOVENOUS  FISTULA  AND EVACUATION OF HEMATOMA;  Surgeon: Conrad [place], MD;  Location: [facility] ORS;  Service: Vascular;  Laterality: Right;   PACEMAKER IMPLANT N/A 11/29/2019   Procedure: PACEMAKER IMPLANT;  Surgeon: Constance Haw, MD;  Location: [place] CV LAB;  Service: Cardiovascular;  Laterality: N/A;   TEMPORARY PACEMAKER N/A 11/29/2019   Procedure: TEMPORARY PACEMAKER;  Surgeon: Isaias Cowman, MD;  Location: [place] CV LAB;  Service: Cardiovascular;  Laterality: N/A;    Prior to Admission medications   Medication Sig Start Date End Date Taking? Authorizing Provider  Cholecalciferol (VITAMIN D3) 50 MCG (2000 UT) TABS Take by mouth.    [provider]  collagenase (SANTYL) ointment Apply 1 application topically daily.    [provider]  diltiazem (CARTIA XT)  240 MG 24 hr capsule Take 1 capsule (240 mg total) by mouth daily. 09/10/21 09/10/22  Baldwin Jamaica, PA-C  fluticasone (FLONASE) 50 MCG/ACT nasal spray Place 2 sprays into both nostrils daily.    [provider]  insulin detemir (LEVEMIR) 100 UNIT/ML injection Inject 100 Units into the skin daily.    [provider]  insulin lispro (HUMALOG) 100 UNIT/ML injection Inject 100 Units into the skin 3 (three) times daily  before meals.    [provider]  levothyroxine (SYNTHROID) 100 MCG tablet Take 100 mcg by mouth daily before breakfast.    [provider]  Multiple Vitamin (MULTIVITAMIN ADULT) TABS Take by mouth.    [provider]  mupirocin cream (BACTROBAN) 2 % Apply 1 application topically daily.    [provider]  oxyCODONE-acetaminophen (PERCOCET/ROXICET) 5-325 MG tablet Take 2 tablets by mouth every 4 (four) hours as needed for severe pain.    [provider]  pantoprazole (PROTONIX) 20 MG tablet Take 20 mg by mouth daily.    [provider]  polyethylene glycol (MIRALAX / GLYCOLAX) 17 g packet Take 17 g by mouth daily.    [provider]  QUEtiapine (SEROQUEL) 25 MG tablet Take 25 mg by mouth at bedtime.    [provider]  senna (SENOKOT) 8.6 MG TABS tablet Take 2 tablets by mouth daily.    [provider]  tiotropium (SPIRIVA) 18 MCG inhalation capsule Place 18 mcg into inhaler and inhale daily.    [provider]  vitamin C (ASCORBIC ACID) 500 MG tablet Take 500 mg by mouth daily.    [provider]    Allergies Iodinated diagnostic agents, Benzocaine-chloroxylenol-hc, and Benzonatate  Family History  Problem Relation Age of Onset   Diabetes Mother    Heart attack Mother    Stomach cancer Father    Colon cancer Sister    Diabetes Sister    Heart attack Brother    Colon cancer Brother    Breast cancer Neg Hx     Social History Social History   Tobacco Use   Smoking status: Former    Types: Cigarettes    Quit date: 07/28/2013    Years since quitting: 8.1   Smokeless tobacco: Never  Substance Use Topics   Alcohol use: No   Drug use: No    Review of Systems Unable to obtain secondary to altered mental status.   ____________________________________________   PHYSICAL EXAM:  VITAL SIGNS: ED Triage Vitals  Enc Vitals Group     BP 09/14/21 2045 (!) 107/48     Pulse Rate  09/14/21 2045 61     Resp 09/14/21 2045 14     Temp 09/14/21 2130 (!) 91 F (32.8 C)     Temp Source 09/14/21 2130 Rectal     SpO2 09/14/21 2045 100 %     Weight 09/14/21 2045 154 lb 5.2 oz (70 kg)     Height 09/14/21 2045 5\' 5"  (1.651 m)   Constitutional: Minimally responsive to physical stimuli.  Eyes: Conjunctivae are normal.  ENT      Head: Normocephalic . Abrasion/bruising to forehead.       Nose: No congestion/rhinnorhea.      Mouth/Throat: Mucous membranes are moist.      Neck: No stridor. Hematological/Lymphatic/Immunilogical: No cervical lymphadenopathy. Cardiovascular: Normal rate, regular rhythm.  No murmurs, rubs, or gallops.  Respiratory: Normal respiratory effort without tachypnea nor retractions. Breath sounds are clear and equal bilaterally. No wheezes/rales/rhonchi.  Gastrointestinal: Soft and non tender. No rebound. No guarding.  Genitourinary: Deferred Musculoskeletal: Normal range of motion in all extremities. No lower extremity edema. Neurologic:  Minimally responsive to physical stimuli.  Skin:  Skin is warm, dry and intact. No rash noted. ____________________________________________    LABS (pertinent positives/negatives)  Lactic acid 0.7 Trop hs 34 CBC wbc 6.1, hgb 9.2, plt 201 BMP na 144, k 4.0, glu 41, cr 3.86  ____________________________________________   EKG  I, Nance Pear, attending physician, personally viewed and interpreted this EKG  EKG Time: 2043 Rate: 60 Rhythm: sinus rhythm Axis: normal Intervals: qtc 545 QRS: LBBB ST changes: no st elevation Impression: abnormal ekg  ____________________________________________    RADIOLOGY  CT head/cervical spine No acute intracranial abnormality. No acute osseous abnormality.  CXR No acute abnormality  CT abd/pel Bilateral sixth rib fractures. No acute intraabdominal abnormality. ____________________________________________   PROCEDURES  Procedures  CRITICAL  CARE Performed by: Nance Pear   Total critical care time: 40 minutes  Critical care time was exclusive of separately billable procedures and treating other patients.  Critical care was necessary to treat or prevent imminent or life-threatening deterioration.  Critical care was time spent personally by me on the following activities: development of treatment plan with patient and/or surrogate as well as nursing, discussions with consultants, evaluation of patient's response to treatment, examination of patient, obtaining history from patient or surrogate, ordering and performing treatments and interventions, ordering and review of laboratory studies, ordering and review of radiographic studies, pulse oximetry and re-evaluation of patient's condition.  ____________________________________________   INITIAL IMPRESSION / ASSESSMENT AND PLAN / ED COURSE  Pertinent labs & imaging results that were available during my care of the patient were reviewed by me and considered in my medical decision making (see chart for details).   Patient presented to the emergency department today with concerns for unresponsive episode.  She was administered some CPR by staff at facility however when EMS arrived she had a pulse but was found to be hypoglycemic.  On initial exam patient was only responsive to physical stimuli initially.  However after having the sugar stabilized some the patient started becoming more responsive.  She was then complaining of pain.  Slightly unclear.  The pain although she did at 1 point point to her upper abdomen.  Thus a CT scan was obtained.  This did not show any acute intra-abdominal abnormalities although did show a couple rib fractures which is likely secondary to the CPR.  Chest x-ray did not show any pneumothorax.  Given hypothermia and hypoglycemia did have concerns for possible infection so patient was given broad-spectrum antibiotics.   ____________________________________________   FINAL CLINICAL IMPRESSION(S) / ED DIAGNOSES  Final diagnoses:  Hypothermia, initial encounter  Hypoglycemia  Closed fracture of multiple ribs of both sides, initial encounter     Note: This dictation was prepared with Dragon dictation. Any transcriptional errors that result from this process are unintentional     Nance Pear, MD 09/15/21 1528

## 2021-09-14 NOTE — ED Notes (Signed)
Serum glucose=41 from lab ,,,    D50 amp given post  BGL bedside

## 2021-09-15 DIAGNOSIS — R410 Disorientation, unspecified: Secondary | ICD-10-CM | POA: Diagnosis not present

## 2021-09-15 DIAGNOSIS — Y848 Other medical procedures as the cause of abnormal reaction of the patient, or of later complication, without mention of misadventure at the time of the procedure: Secondary | ICD-10-CM | POA: Diagnosis present

## 2021-09-15 DIAGNOSIS — X58XXXD Exposure to other specified factors, subsequent encounter: Secondary | ICD-10-CM | POA: Diagnosis not present

## 2021-09-15 DIAGNOSIS — J441 Chronic obstructive pulmonary disease with (acute) exacerbation: Secondary | ICD-10-CM | POA: Diagnosis not present

## 2021-09-15 DIAGNOSIS — R6521 Severe sepsis with septic shock: Secondary | ICD-10-CM | POA: Diagnosis not present

## 2021-09-15 DIAGNOSIS — Y92129 Unspecified place in nursing home as the place of occurrence of the external cause: Secondary | ICD-10-CM | POA: Diagnosis not present

## 2021-09-15 DIAGNOSIS — R0600 Dyspnea, unspecified: Secondary | ICD-10-CM | POA: Diagnosis not present

## 2021-09-15 DIAGNOSIS — E11649 Type 2 diabetes mellitus with hypoglycemia without coma: Secondary | ICD-10-CM | POA: Diagnosis present

## 2021-09-15 DIAGNOSIS — Z9049 Acquired absence of other specified parts of digestive tract: Secondary | ICD-10-CM | POA: Diagnosis not present

## 2021-09-15 DIAGNOSIS — F02811 Dementia in other diseases classified elsewhere, unspecified severity, with agitation: Secondary | ICD-10-CM | POA: Diagnosis present

## 2021-09-15 DIAGNOSIS — I129 Hypertensive chronic kidney disease with stage 1 through stage 4 chronic kidney disease, or unspecified chronic kidney disease: Secondary | ICD-10-CM | POA: Diagnosis present

## 2021-09-15 DIAGNOSIS — Z20822 Contact with and (suspected) exposure to covid-19: Secondary | ICD-10-CM | POA: Diagnosis present

## 2021-09-15 DIAGNOSIS — R578 Other shock: Secondary | ICD-10-CM | POA: Diagnosis not present

## 2021-09-15 DIAGNOSIS — Z66 Do not resuscitate: Secondary | ICD-10-CM | POA: Diagnosis present

## 2021-09-15 DIAGNOSIS — I480 Paroxysmal atrial fibrillation: Secondary | ICD-10-CM | POA: Diagnosis not present

## 2021-09-15 DIAGNOSIS — N184 Chronic kidney disease, stage 4 (severe): Secondary | ICD-10-CM | POA: Diagnosis present

## 2021-09-15 DIAGNOSIS — E039 Hypothyroidism, unspecified: Secondary | ICD-10-CM | POA: Diagnosis not present

## 2021-09-15 DIAGNOSIS — Z515 Encounter for palliative care: Secondary | ICD-10-CM | POA: Diagnosis not present

## 2021-09-15 DIAGNOSIS — E162 Hypoglycemia, unspecified: Secondary | ICD-10-CM | POA: Diagnosis present

## 2021-09-15 DIAGNOSIS — R4182 Altered mental status, unspecified: Secondary | ICD-10-CM | POA: Diagnosis not present

## 2021-09-15 DIAGNOSIS — N189 Chronic kidney disease, unspecified: Secondary | ICD-10-CM | POA: Diagnosis not present

## 2021-09-15 DIAGNOSIS — F0284 Dementia in other diseases classified elsewhere, unspecified severity, with anxiety: Secondary | ICD-10-CM | POA: Diagnosis present

## 2021-09-15 DIAGNOSIS — N179 Acute kidney failure, unspecified: Secondary | ICD-10-CM | POA: Diagnosis not present

## 2021-09-15 DIAGNOSIS — A419 Sepsis, unspecified organism: Secondary | ICD-10-CM | POA: Diagnosis not present

## 2021-09-15 DIAGNOSIS — F05 Delirium due to known physiological condition: Secondary | ICD-10-CM | POA: Diagnosis present

## 2021-09-15 DIAGNOSIS — W19XXXA Unspecified fall, initial encounter: Secondary | ICD-10-CM | POA: Diagnosis present

## 2021-09-15 DIAGNOSIS — J439 Emphysema, unspecified: Secondary | ICD-10-CM | POA: Diagnosis present

## 2021-09-15 DIAGNOSIS — S2243XA Multiple fractures of ribs, bilateral, initial encounter for closed fracture: Secondary | ICD-10-CM | POA: Diagnosis not present

## 2021-09-15 DIAGNOSIS — G309 Alzheimer's disease, unspecified: Secondary | ICD-10-CM | POA: Diagnosis present

## 2021-09-15 DIAGNOSIS — E872 Acidosis, unspecified: Secondary | ICD-10-CM | POA: Diagnosis present

## 2021-09-15 DIAGNOSIS — F03918 Unspecified dementia, unspecified severity, with other behavioral disturbance: Secondary | ICD-10-CM | POA: Diagnosis not present

## 2021-09-15 DIAGNOSIS — G9341 Metabolic encephalopathy: Secondary | ICD-10-CM | POA: Diagnosis not present

## 2021-09-15 DIAGNOSIS — J9811 Atelectasis: Secondary | ICD-10-CM | POA: Diagnosis not present

## 2021-09-15 DIAGNOSIS — T68XXXA Hypothermia, initial encounter: Secondary | ICD-10-CM | POA: Diagnosis present

## 2021-09-15 DIAGNOSIS — K219 Gastro-esophageal reflux disease without esophagitis: Secondary | ICD-10-CM | POA: Diagnosis present

## 2021-09-15 DIAGNOSIS — E860 Dehydration: Secondary | ICD-10-CM | POA: Diagnosis present

## 2021-09-15 DIAGNOSIS — M96A3 Multiple fractures of ribs associated with chest compression and cardiopulmonary resuscitation: Secondary | ICD-10-CM | POA: Diagnosis present

## 2021-09-15 DIAGNOSIS — E1122 Type 2 diabetes mellitus with diabetic chronic kidney disease: Secondary | ICD-10-CM | POA: Diagnosis present

## 2021-09-15 LAB — RESPIRATORY PANEL BY PCR

## 2021-09-15 LAB — CBC
HCT: 27.5 % — ABNORMAL LOW (ref 36.0–46.0)
Hemoglobin: 8.3 g/dL — ABNORMAL LOW (ref 12.0–15.0)
MCH: 28.1 pg (ref 26.0–34.0)
MCHC: 30.2 g/dL (ref 30.0–36.0)
MCV: 93.2 fL (ref 80.0–100.0)
Platelets: 195 10*3/uL (ref 150–400)
RBC: 2.95 MIL/uL — ABNORMAL LOW (ref 3.87–5.11)
RDW: 19 % — ABNORMAL HIGH (ref 11.5–15.5)
WBC: 6.4 10*3/uL (ref 4.0–10.5)
nRBC: 0 % (ref 0.0–0.2)

## 2021-09-15 LAB — BASIC METABOLIC PANEL
Anion gap: 7 (ref 5–15)
Anion gap: 7 (ref 5–15)
BUN: 51 mg/dL — ABNORMAL HIGH (ref 8–23)
BUN: 46 mg/dL — ABNORMAL HIGH (ref 8–23)
CO2: 18 mmol/L — ABNORMAL LOW (ref 22–32)
CO2: 18 mmol/L — ABNORMAL LOW (ref 22–32)
Calcium: 7.9 mg/dL — ABNORMAL LOW (ref 8.9–10.3)
Calcium: 7.5 mg/dL — ABNORMAL LOW (ref 8.9–10.3)
Chloride: 114 mmol/L — ABNORMAL HIGH (ref 98–111)
Chloride: 117 mmol/L — ABNORMAL HIGH (ref 98–111)
Creatinine, Ser: 3.25 mg/dL — ABNORMAL HIGH (ref 0.44–1.00)
Creatinine, Ser: 2.83 mg/dL — ABNORMAL HIGH (ref 0.44–1.00)
GFR, Estimated: 13 mL/min — ABNORMAL LOW (ref >60)
GFR, Estimated: 15 mL/min — ABNORMAL LOW (ref >60)
Glucose, Bld: 287 mg/dL — ABNORMAL HIGH (ref 70–99)
Glucose, Bld: 204 mg/dL — ABNORMAL HIGH (ref 70–99)
Potassium: 4.2 mmol/L (ref 3.5–5.1)
Potassium: 4 mmol/L (ref 3.5–5.1)
Sodium: 139 mmol/L (ref 135–145)
Sodium: 142 mmol/L (ref 135–145)

## 2021-09-15 LAB — URINALYSIS, COMPLETE (UACMP) WITH MICROSCOPIC
Bilirubin Urine: NEGATIVE
Glucose, UA: NEGATIVE mg/dL
Hgb urine dipstick: NEGATIVE
Ketones, ur: NEGATIVE mg/dL
Leukocytes,Ua: NEGATIVE
Nitrite: NEGATIVE
Protein, ur: NEGATIVE mg/dL
Specific Gravity, Urine: 1.019 (ref 1.005–1.030)
pH: 5 (ref 5.0–8.0)

## 2021-09-15 LAB — CBG MONITORING, ED
Glucose-Capillary: 205 mg/dL — ABNORMAL HIGH (ref 70–99)
Glucose-Capillary: 137 mg/dL — ABNORMAL HIGH (ref 70–99)
Glucose-Capillary: 133 mg/dL — ABNORMAL HIGH (ref 70–99)
Glucose-Capillary: 93 mg/dL (ref 70–99)
Glucose-Capillary: 195 mg/dL — ABNORMAL HIGH (ref 70–99)
Glucose-Capillary: 132 mg/dL — ABNORMAL HIGH (ref 70–99)
Glucose-Capillary: 98 mg/dL (ref 70–99)

## 2021-09-15 LAB — TSH: TSH: 1.486 u[IU]/mL (ref 0.350–4.500)

## 2021-09-15 LAB — C-REACTIVE PROTEIN: CRP: 2.6 mg/dL — ABNORMAL HIGH (ref <1.0)

## 2021-09-15 LAB — PROCALCITONIN: Procalcitonin: 0.12 ng/mL

## 2021-09-15 LAB — TROPONIN I (HIGH SENSITIVITY): Troponin I (High Sensitivity): 32 ng/L — ABNORMAL HIGH (ref <18)

## 2021-09-15 LAB — LACTIC ACID, PLASMA: Lactic Acid, Venous: 1.5 mmol/L (ref 0.5–1.9)

## 2021-09-15 MED ORDER — SODIUM CHLORIDE 0.9 % IV SOLN
250.0000 mL | INTRAVENOUS | Status: DC
  Administered 2021-09-15 – 2021-09-19 (×5): 250 mL via INTRAVENOUS

## 2021-09-15 MED ORDER — FENTANYL CITRATE PF 50 MCG/ML IJ SOSY
12.5000 ug | PREFILLED_SYRINGE | INTRAMUSCULAR | Status: AC | PRN
  Administered 2021-09-16: 12.5 ug via INTRAVENOUS
  Administered 2021-09-16: 25 ug via INTRAVENOUS
  Filled 2021-09-15 (×2): qty 1

## 2021-09-15 MED ORDER — QUETIAPINE FUMARATE 25 MG PO TABS: 25.0000 mg | ORAL_TABLET | Freq: Every day | ORAL | Status: DC

## 2021-09-15 MED ORDER — DILTIAZEM HCL ER COATED BEADS 240 MG PO CP24: 240.0000 mg | ORAL_CAPSULE | Freq: Every day | ORAL | Status: DC

## 2021-09-15 MED ORDER — LEVOTHYROXINE SODIUM 100 MCG PO TABS
100.0000 ug | ORAL_TABLET | Freq: Every day | ORAL | Status: DC
  Administered 2021-09-16: 100 ug via ORAL
  Filled 2021-09-15: qty 2

## 2021-09-15 MED ORDER — HALOPERIDOL LACTATE 5 MG/ML IJ SOLN
2.0000 mg | Freq: Four times a day (QID) | INTRAMUSCULAR | Status: DC | PRN
  Administered 2021-09-15 – 2021-09-18 (×4): 2 mg via INTRAMUSCULAR
  Filled 2021-09-15 (×4): qty 1

## 2021-09-15 MED ORDER — FLUTICASONE PROPIONATE 50 MCG/ACT NA SUSP
2.0000 | Freq: Every day | NASAL | Status: DC
  Administered 2021-09-18 – 2021-09-23 (×4): 2 via NASAL
  Filled 2021-09-15 (×2): qty 16

## 2021-09-15 MED ORDER — STERILE WATER FOR INJECTION IV SOLN
INTRAVENOUS | Status: AC
  Filled 2021-09-15: qty 1000
  Filled 2021-09-15: qty 150
  Filled 2021-09-15 (×4): qty 1000

## 2021-09-15 MED ORDER — LORAZEPAM 2 MG/ML IJ SOLN
0.5000 mg | INTRAMUSCULAR | Status: DC | PRN
  Administered 2021-09-22: 0.5 mg via INTRAMUSCULAR
  Filled 2021-09-15: qty 1

## 2021-09-15 MED ORDER — ACETAMINOPHEN 325 MG PO TABS
ORAL_TABLET | ORAL | Status: AC
  Administered 2021-09-15: 650 mg via ORAL
  Filled 2021-09-15: qty 2

## 2021-09-15 MED ORDER — ACETAMINOPHEN 500 MG PO TABS: 1000.0000 mg | ORAL_TABLET | Freq: Once | ORAL | Status: DC

## 2021-09-15 MED ORDER — MORPHINE SULFATE (PF) 2 MG/ML IV SOLN
2.0000 mg | INTRAVENOUS | Status: DC | PRN
  Administered 2021-09-15 (×3): 2 mg via INTRAVENOUS
  Filled 2021-09-15 (×3): qty 1

## 2021-09-15 MED ORDER — OXYCODONE-ACETAMINOPHEN 5-325 MG PO TABS: 2.0000 | ORAL_TABLET | ORAL | Status: DC | PRN

## 2021-09-15 MED ORDER — LIDOCAINE 5 % EX PTCH
1.0000 | MEDICATED_PATCH | CUTANEOUS | Status: DC
  Administered 2021-09-15 – 2021-09-23 (×8): 1 via TRANSDERMAL
  Filled 2021-09-15 (×9): qty 1

## 2021-09-15 MED ORDER — LORAZEPAM 2 MG/ML IJ SOLN
0.5000 mg | INTRAMUSCULAR | Status: DC | PRN
  Administered 2021-09-15 – 2021-09-16 (×6): 0.5 mg via INTRAVENOUS
  Filled 2021-09-15 (×6): qty 1

## 2021-09-15 MED ORDER — FENTANYL CITRATE PF 50 MCG/ML IJ SOSY
12.5000 ug | PREFILLED_SYRINGE | INTRAMUSCULAR | Status: DC | PRN
  Administered 2021-09-15 (×2): 12.5 ug via INTRAVENOUS
  Filled 2021-09-15 (×2): qty 1

## 2021-09-15 MED ORDER — LORAZEPAM 2 MG/ML IJ SOLN: 0.5000 mg | INTRAMUSCULAR | Status: DC | PRN

## 2021-09-15 MED ORDER — TRAZODONE HCL 50 MG PO TABS: 25.0000 mg | ORAL_TABLET | Freq: Every evening | ORAL | Status: DC | PRN

## 2021-09-15 MED ORDER — ACETAMINOPHEN 325 MG RE SUPP: 650.0000 mg | Freq: Four times a day (QID) | RECTAL | Status: DC | PRN

## 2021-09-15 MED ORDER — DEXTROSE 5 % IV SOLN: INTRAVENOUS | Status: DC

## 2021-09-15 MED ORDER — DEXTROSE-NACL 5-0.9 % IV SOLN: INTRAVENOUS | Status: DC

## 2021-09-15 MED ORDER — DIPHENHYDRAMINE HCL 50 MG/ML IJ SOLN
25.0000 mg | Freq: Every evening | INTRAMUSCULAR | Status: DC | PRN
  Administered 2021-09-16 (×2): 25 mg via INTRAVENOUS
  Filled 2021-09-15 (×2): qty 1

## 2021-09-15 MED ORDER — MIDODRINE HCL 5 MG PO TABS: 5.0000 mg | ORAL_TABLET | Freq: Three times a day (TID) | ORAL | Status: DC

## 2021-09-15 MED ORDER — INSULIN ASPART 100 UNIT/ML IJ SOLN
0.0000 [IU] | Freq: Three times a day (TID) | INTRAMUSCULAR | Status: DC
Start: 2021-09-15 — End: 2021-09-16
  Administered 2021-09-15: 2 [IU] via SUBCUTANEOUS
  Administered 2021-09-15: 1 [IU] via SUBCUTANEOUS
  Filled 2021-09-15 (×2): qty 1

## 2021-09-15 MED ORDER — COLLAGENASE 250 UNIT/GM EX OINT
1.0000 "application " | TOPICAL_OINTMENT | Freq: Every day | CUTANEOUS | Status: DC
  Administered 2021-09-18 – 2021-09-20 (×3): 1 via TOPICAL
  Filled 2021-09-15 (×2): qty 30

## 2021-09-15 MED ORDER — ONDANSETRON HCL 4 MG/2ML IJ SOLN: 4.0000 mg | Freq: Four times a day (QID) | INTRAMUSCULAR | Status: DC | PRN

## 2021-09-15 MED ORDER — TIOTROPIUM BROMIDE MONOHYDRATE 18 MCG IN CAPS
18.0000 ug | ORAL_CAPSULE | Freq: Every day | RESPIRATORY_TRACT | Status: DC
  Filled 2021-09-15 (×2): qty 5

## 2021-09-15 MED ORDER — SODIUM CHLORIDE 0.9 % IV BOLUS
2000.0000 mL | Freq: Once | INTRAVENOUS | Status: AC
  Administered 2021-09-15: 2000 mL via INTRAVENOUS

## 2021-09-15 MED ORDER — SODIUM BICARBONATE 8.4 % IV SOLN
INTRAVENOUS | Status: AC
  Filled 2021-09-15 (×3): qty 1000

## 2021-09-15 MED ORDER — NOREPINEPHRINE 4 MG/250ML-% IV SOLN
2.0000 ug/min | INTRAVENOUS | Status: DC
  Administered 2021-09-15 (×2): 2 ug/min via INTRAVENOUS
  Filled 2021-09-15: qty 250

## 2021-09-15 MED ORDER — PANTOPRAZOLE SODIUM 20 MG PO TBEC
20.0000 mg | DELAYED_RELEASE_TABLET | Freq: Every day | ORAL | Status: DC
  Filled 2021-09-15 (×6): qty 1

## 2021-09-15 MED ORDER — ENOXAPARIN SODIUM 40 MG/0.4ML IJ SOSY: 40.0000 mg | PREFILLED_SYRINGE | INTRAMUSCULAR | Status: DC

## 2021-09-15 MED ORDER — ONDANSETRON HCL 4 MG PO TABS: 4.0000 mg | ORAL_TABLET | Freq: Four times a day (QID) | ORAL | Status: DC | PRN

## 2021-09-15 MED ORDER — MUPIROCIN CALCIUM 2 % EX CREA
1.0000 "application " | TOPICAL_CREAM | Freq: Every day | CUTANEOUS | Status: DC
  Filled 2021-09-15: qty 15

## 2021-09-15 MED ORDER — ACETAMINOPHEN 325 MG PO TABS: 650.0000 mg | ORAL_TABLET | Freq: Four times a day (QID) | ORAL | Status: DC | PRN

## 2021-09-15 MED ORDER — NOREPINEPHRINE 4 MG/250ML-% IV SOLN: 0.0000 ug/min | INTRAVENOUS | Status: DC

## 2021-09-15 MED ORDER — LORAZEPAM 2 MG/ML IJ SOLN
1.0000 mg | Freq: Once | INTRAMUSCULAR | Status: AC | PRN
  Administered 2021-09-15: 1 mg via INTRAVENOUS
  Filled 2021-09-15: qty 1

## 2021-09-15 NOTE — ED Notes (Addendum)
MD messaged in regard to pt BP trending down and now reading 57/22. No new orders at this time   ------------------------------------------------------------------------------------------------ I was in direct communication with RN during this period in secure chat and immediately placed orders in regard to vital signs which can be verified by looking at the [facility] and order history. I also have screen shot of the secure chat messages.  Doristine Mango, DO

## 2021-09-15 NOTE — ED Notes (Signed)
Spoke with IV team to start 2nd IV

## 2021-09-15 NOTE — ED Notes (Addendum)
Admitting MD aware of pt's BP decreasing. No new orders at this time  ------------------------------------------------------------------------------------------------ I was in direct communication with RN during this period in secure chat and immediately placed orders in regard to vital signs which can be verified by looking at the [facility] and order history. I also have screen shot of the secure chat messages.  Doristine Mango, DO

## 2021-09-15 NOTE — Progress Notes (Signed)
Inpatient Diabetes Program Recommendations  AACE/ADA: New Consensus Statement on Inpatient Glycemic Control   Target Ranges:  Prepandial:   less than 140 mg/dL      [facility] postprandial:   less than 180 mg/dL (1-2 hours)      Critically ill patients:  140 - 180 mg/dL    Latest Reference Range & Units 09/14/21 20:45 09/14/21 22:07 09/14/21 22:09 09/14/21 23:05 09/15/21 04:22 09/15/21 08:24  Glucose-Capillary 70 - 99 mg/dL 194 (H) 31 (LL) 74 152 (H) 205 (H) 137 (H)  (LL): Data is critically low (H): Data is abnormally high  Review of Glycemic Control Diabetes history: DM2 Outpatient Diabetes medications: Levemir 16 units QHS, Humalog 0-12 units TID with meals Current orders for Inpatient glycemic control: Novolog 0-9 units AC&HS  Inpatient Diabetes Program Recommendations:    Insulin: At time of discharge, may want to consider decreasing or discontinuing outpatient Levemir dose and decreasing Humalog correction scale to 0-9 units TID with meals (similar to the scale used while inpatient). Would not recommend tight glucose control given patient is 87 years old and high risk for falls and injury if she has hypoglycemia.  NOTE: Patient admitted from [facility] with hypoglycemia. Per home medication list, Levemir 100 units daily and Humalog 100 units TID before meals are DM medications patient is receiving. Called [facility] and spoke with Darden Amber (nurse at facility) to clarify DM medications. Nurse reported that patient is ordered Levemir 16 units QHS (last given on 09/13/21) and Humalog sliding scale 0-12 units TID before meal (correction starts with glucose of 121 mg/dl; last given Humalog 8 units at 17:00 on 09/14/21 for glucose of 268 mg/dl).  Updated home medication list to reflect information obtained from the nurse at [facility]. In looking over chart, noted patient was last inpatient 07/19/21-07/21/21 and all insulin was discontinued per discharge summary on 07/21/21. The nurse at [facility] reported  that the current orders for Levemir and Humalog was started on 07/31/21.   Thanks, Barnie Alderman, RN, MSN, CDE Diabetes Coordinator Inpatient Diabetes Program [phone] (Team Pager from 8am to 5pm)

## 2021-09-15 NOTE — ED Notes (Addendum)
MD messaged in regard to pt's worsening mentation status. No new orders at this time   ------------------------------------------------------------------------------------------------ I was in direct communication with RN during this period in secure chat and immediately placed orders in regard to vital signs which can be verified by looking at the [facility] and order history. I also have screen shot of the secure chat messages.  Doristine Mango, DO

## 2021-09-15 NOTE — ED Notes (Signed)
Informed RN bed assigned 

## 2021-09-15 NOTE — Progress Notes (Signed)
  INTERVAL PROGRESS NOTE    Denise [name]- 87 y.o. female  LOS: 0 __________________________________________________________________  SUBJECTIVE: Admitted 09/14/2021 with cc of hypoglycemia Chief Complaint  Patient presents with   Hypoglycemia   Since admission, patient has been extremely confused and agitated/anxious. She is not oriented to self, place, or time. She is not able to verbalize her concerns.   OBJECTIVE: Blood pressure (!) 125/110, pulse 76, temperature (!) 96.1 F (35.6 C), temperature source Axillary, resp. rate 17, height 5\' 5"  (1.651 m), weight 70 kg, SpO2 100 %.  Physical Exam Vitals and nursing note reviewed. Exam conducted with a chaperone present.  Constitutional:      General: She is in acute distress.  HENT:     Mouth/Throat:     Mouth: Mucous membranes are moist.  Eyes:     Pupils: Pupils are equal, round, and reactive to light.  Cardiovascular:     Rate and Rhythm: Normal rate.     Pulses: Normal pulses.     Heart sounds: Murmur heard.  Pulmonary:     Effort: Pulmonary effort is normal.     Breath sounds: Normal breath sounds.  Abdominal:     Palpations: Abdomen is soft.  Musculoskeletal:        General: No swelling.  Skin:    General: Skin is warm and dry.     Capillary Refill: Capillary refill takes less than 2 seconds.     Findings: Bruising (forearms and chest) present.  Neurological:     Mental Status: She is alert. She is disoriented.     Comments: Shouting "no" intermittently. Unable to answer questions or follow commands. Moving all extremities excessively in agitated state  Psychiatric:     Comments: Agitated, pulling at gown, wires, lines, blankets   ASSESSMENT/PLAN:  I have reviewed the full H&P by Dr. Mitchell Heir, and I agree with the assessment and plan as outlined therein. In addition:  AMS- patient extremely agitated/anxious. Not controlled with 0.5mg  ativan, sitter, and mitts. No infection on urinalysis  - additional 1mg  IV  ativan and maintain sitter for close observation  - address her pain with IV fentanyl, discontinue morphine as this was likely contributing to hypotension.  - consult palliative for GOC discussion once patient more oriented  - escalated care to step down, may need ICU level care  - blood cultures pending  - frequent neuro checks  Metabolic encephalopathy  acidemia - initially thought to be due to hypoglycemia which has been resolved and still remains encephalopathic.   - VBG  - bicarb drip with dextrose while NPO  - serial BMPs to monitor bicarb/electrolytes  Hypotension- in part due to dehydration vs medication side effect  - levophed ordered as patient can not tolerate PO. Pressures improved and are now holding levophed until needed again  - vitals q1hr or sooner if needed  - continue IV fluids at 557mL/hr for 3L  DM- currently on dextrose drip. Blood sugars 133-137 today  - continue dextrose while NPO  - sSSI  Hypothyroidism-   - check TSH STAT  AKI- showing mild improvement after some IV fluids. Continue to monitor  Principal Problem:   Hypoglycemia   Richarda Osmond, DO [facility] 09/15/2021, 7:15 AM    [email or web address] Available by Epic secure chat 7AM-7PM. If 7PM-7AM, please contact night-coverage   No Charge

## 2021-09-15 NOTE — ED Notes (Signed)
This RN and previous shift RN to bedside for assessment and repositioning. Pt. Is awake and restless, speaking incoherently, oriented x1. Pt. Is pulling at clothing and lines. Distraction and repositioning attempts made. Pt. Has 1:1 sitter at bedside. Pt. Gown and linens changed. Will continue to monitor.

## 2021-09-15 NOTE — Consult Note (Addendum)
CRITICAL CARE CONSULTATION    Name: Denise [name] MRN: [id number] DOB: 07/09/1930     LOS: 0   SUBJECTIVE FINDINGS & SIGNIFICANT EVENTS    Patient description:  This is a 87yo F with hx of HTN, AF, DM2, chronic rhinits, ckd4, emphysema, hypothyroidism, chronic LE edema, physical deconditioning and Alzheimers dementia who came in after having cardiac arrest at home with CPR performed. She is in ER with confusion, rib fractures, pelvic fractures.  She has developed circulatory shock while in MICU and requires vasopressor support.  Patient admitted to stepdown with [facility] service with PCCM consultation for further evaluation and management.   Lines/tubes :   Microbiology/Sepsis markers: Results for orders placed or performed during the hospital encounter of 09/14/21  Blood culture (routine x 2)     Status: None (Preliminary result)   Collection Time: 09/14/21  9:37 PM   Specimen: Right Antecubital; Blood  Result Value Ref Range Status   Specimen Description RIGHT ANTECUBITAL  Final   Special Requests   Final    BOTTLES DRAWN AEROBIC AND ANAEROBIC Blood Culture adequate volume   Culture   Final    NO GROWTH < 12 HOURS Performed at [facility], [address] [place], [place] [address]    Report Status PENDING  Incomplete  Blood culture (routine x 2)     Status: None (Preliminary result)   Collection Time: 09/14/21  9:42 PM   Specimen: BLOOD LEFT HAND  Result Value Ref Range Status   Specimen Description BLOOD LEFT HAND  Final   Special Requests   Final    BOTTLES DRAWN AEROBIC AND ANAEROBIC Blood Culture adequate volume   Culture   Final    NO GROWTH < 12 HOURS Performed at [facility], [address], [place], [place] [address]    Report Status PENDING  Incomplete  Resp Panel by  RT-PCR (Flu A&B, Covid) Nasopharyngeal Swab     Status: None   Collection Time: 09/14/21 10:46 PM   Specimen: Nasopharyngeal Swab; Nasopharyngeal(NP) swabs in vial transport medium  Result Value Ref Range Status   SARS Coronavirus 2 by RT PCR NEGATIVE NEGATIVE Final    Comment: (NOTE) SARS-CoV-2 target nucleic acids are NOT DETECTED.  The SARS-CoV-2 RNA is generally detectable in upper respiratory specimens during the acute phase of infection. The lowest concentration of SARS-CoV-2 viral copies this assay can detect is 138 copies/mL. A negative result does not preclude SARS-Cov-2 infection and should not be used as the sole basis for treatment or other patient management decisions. A negative result may occur with  improper specimen collection/handling, submission of specimen other than nasopharyngeal swab, presence of viral mutation(s) within the areas targeted by this assay, and inadequate number of viral copies(<138 copies/mL). A negative result must be combined with clinical observations, patient history, and epidemiological information. The expected result is Negative.  Fact Sheet for Patients:  EntrepreneurPulse.com.au  Fact Sheet for Healthcare Providers:  IncredibleEmployment.be  This test is no t yet approved or cleared by the [place] FDA and  has been authorized for detection and/or diagnosis of SARS-CoV-2 by FDA under an Emergency Use Authorization (EUA). This EUA will remain  in effect (meaning this test can be used) for the duration of the COVID-19 declaration under Section 564(b)(1) of the Act, 21 U.S.C.section 360bbb-3(b)(1), unless the authorization is terminated  or revoked sooner.       Influenza A by PCR NEGATIVE NEGATIVE Final   Influenza B by PCR NEGATIVE  NEGATIVE Final    Comment: (NOTE) The Xpert Xpress SARS-CoV-2/FLU/RSV plus assay is intended as an aid in the diagnosis of influenza from Nasopharyngeal swab  specimens and should not be used as a sole basis for treatment. Nasal washings and aspirates are unacceptable for Xpert Xpress SARS-CoV-2/FLU/RSV testing.  Fact Sheet for Patients: EntrepreneurPulse.com.au  Fact Sheet for Healthcare Providers: IncredibleEmployment.be  This test is not yet approved or cleared by the [place] FDA and has been authorized for detection and/or diagnosis of SARS-CoV-2 by FDA under an Emergency Use Authorization (EUA). This EUA will remain in effect (meaning this test can be used) for the duration of the COVID-19 declaration under Section 564(b)(1) of the Act, 21 U.S.C. section 360bbb-3(b)(1), unless the authorization is terminated or revoked.  Performed at [facility], [address] [place], [place] [address]     Anti-infectives:  Anti-infectives (From admission, onward)    Start     Dose/Rate Route Frequency Ordered Stop   09/14/21 2330  ceFEPIme (MAXIPIME) 2 g in sodium chloride 0.9 % 100 mL IVPB        2 g 200 mL/hr over 30 Minutes Intravenous  Once 09/14/21 2329 09/15/21 0027   09/14/21 2330  vancomycin (VANCOCIN) IVPB 1000 mg/200 mL premix        1,000 mg 200 mL/hr over 60 Minutes Intravenous  Once 09/14/21 2329 09/15/21 0330       PAST MEDICAL HISTORY   Past Medical History:  Diagnosis Date   A-fib [facility])    Arthritis    Cataract    Chronic kidney disease    COPD (chronic obstructive pulmonary disease) ([place])    Diabetes mellitus without complication ([place])    Emphysema of lung ([place])    GERD (gastroesophageal reflux disease)    Hypertension    Hypothyroidism      SURGICAL HISTORY   Past Surgical History:  Procedure Laterality Date   AV FISTULA PLACEMENT Right 11/18/2015   Procedure: ARTERIOVENOUS (AV) FISTULA CREATION;  Surgeon: Algernon Huxley, MD;  Location: [facility] ORS;  Service: Vascular;  Laterality: Right;   CHOLECYSTECTOMY     GALLBLADDER SURGERY     HIP ARTHROPLASTY Right  07/31/2015   Procedure: ARTHROPLASTY BIPOLAR HIP (HEMIARTHROPLASTY);  Surgeon: Corky Mull, MD;  Location: [facility] ORS;  Service: Orthopedics;  Laterality: Right;   LIGATION OF ARTERIOVENOUS  FISTULA Right 03/14/2021   Procedure: LIGATION OF ARTERIOVENOUS  FISTULA  AND EVACUATION OF HEMATOMA;  Surgeon: Conrad [place], MD;  Location: [facility] ORS;  Service: Vascular;  Laterality: Right;   PACEMAKER IMPLANT N/A 11/29/2019   Procedure: PACEMAKER IMPLANT;  Surgeon: Constance Haw, MD;  Location: [place] CV LAB;  Service: Cardiovascular;  Laterality: N/A;   TEMPORARY PACEMAKER N/A 11/29/2019   Procedure: TEMPORARY PACEMAKER;  Surgeon: Isaias Cowman, MD;  Location: [place] [facility] LAB;  Service: Cardiovascular;  Laterality: N/A;     FAMILY HISTORY   Family History  Problem Relation Age of Onset   Diabetes Mother    Heart attack Mother    Stomach cancer Father    Colon cancer Sister    Diabetes Sister    Heart attack Brother    Colon cancer Brother    Breast cancer Neg Hx      SOCIAL HISTORY   Social History   Tobacco Use   Smoking status: Former    Types: Cigarettes    Quit date: 07/28/2013    Years since quitting: 8.1   Smokeless tobacco: Never  Substance Use Topics   Alcohol use: No   Drug use: No     MEDICATIONS   Current Medication:  Current Facility-Administered Medications:    0.9 %  sodium chloride infusion, 250 mL, Intravenous, Continuous, Dorothe Pea, RPH, Stopped at 09/15/21 1730   acetaminophen (TYLENOL) tablet 650 mg, 650 mg, Oral, Q6H PRN, 650 mg at 09/15/21 0028 **OR** acetaminophen (TYLENOL) suppository 650 mg, 650 mg, Rectal, Q6H PRN, Mansy, Jan A, MD   acetaminophen (TYLENOL) tablet 1,000 mg, 1,000 mg, Oral, Once, Nance Pear, MD   Derrill Memo ON 09/16/2021] collagenase (SANTYL) ointment 1 application, 1 application, Topical, Daily, Mansy, Jan A, MD   diphenhydrAMINE (BENADRYL) injection 25 mg, 25 mg, Intravenous, QHS PRN, Mansy, Jan A, MD    fentaNYL (SUBLIMAZE) injection 12.5 mcg, 12.5 mcg, Intravenous, Q2H PRN, Richarda Osmond, MD, 12.5 mcg at 09/15/21 1627   [START ON 09/16/2021] fluticasone (FLONASE) 50 MCG/ACT nasal spray 2 spray, 2 spray, Each Nare, Daily, Mansy, Jan A, MD   haloperidol lactate (HALDOL) injection 2 mg, 2 mg, Intramuscular, Q6H PRN, Mansy, Jan A, MD, 2 mg at 09/15/21 0431   insulin aspart (novoLOG) injection 0-9 Units, 0-9 Units, Subcutaneous, TID AC & HS, Mansy, Arvella Merles, MD, 1 Units at 09/15/21 1243   levothyroxine (SYNTHROID) tablet 100 mcg, 100 mcg, Oral, Q0600, Mansy, Jan A, MD   lidocaine (LIDODERM) 5 % 1 patch, 1 patch, Transdermal, Q24H, Richarda Osmond, MD, 1 patch at 09/15/21 1728   LORazepam (ATIVAN) injection 0.5 mg, 0.5 mg, Intravenous, Q4H PRN, Mansy, Jan A, MD, 0.5 mg at 09/15/21 1409   LORazepam (ATIVAN) injection 0.5 mg, 0.5 mg, Intramuscular, Q4H PRN, Richarda Osmond, MD   [START ON 09/16/2021] mupirocin cream (BACTROBAN) 2 % 1 application, 1 application, Topical, Daily, Mansy, Jan A, MD   norepinephrine (LEVOPHED) 4mg  in 240mL premix infusion, 2-10 mcg/min, Intravenous, Titrated, Dorothe Pea, RPH, Last Rate: 11.25 mL/hr at 09/15/21 1737, 3 mcg/min at 09/15/21 1737   ondansetron (ZOFRAN) tablet 4 mg, 4 mg, Oral, Q6H PRN **OR** ondansetron (ZOFRAN) injection 4 mg, 4 mg, Intravenous, Q6H PRN, Mansy, Jan A, MD   pantoprazole (PROTONIX) EC tablet 20 mg, 20 mg, Oral, Daily, Mansy, Jan A, MD   QUEtiapine (SEROQUEL) tablet 25 mg, 25 mg, Oral, QHS, Mansy, Jan A, MD   tiotropium [facility]) inhalation capsule ([facility] use ONLY) 18 mcg, 18 mcg, Inhalation, Daily, Mansy, Jan A, MD   traZODone (DESYREL) tablet 25 mg, 25 mg, Oral, QHS PRN, Mansy, Jan A, MD  Current Outpatient Medications:    allopurinol (ZYLOPRIM) 100 MG tablet, Take 100 mg by mouth daily., Disp: , Rfl:    cetirizine (ZYRTEC) 5 MG tablet, Take 5 mg by mouth daily., Disp: , Rfl:    collagenase (SANTYL) ointment, Apply 1 application  topically daily., Disp: , Rfl:    diltiazem (CARDIZEM CD) 120 MG 24 hr capsule, Take 120 mg by mouth daily., Disp: , Rfl:    fluticasone (FLONASE) 50 MCG/ACT nasal spray, Place 2 sprays into both nostrils daily., Disp: , Rfl:    furosemide (LASIX) 40 MG tablet, Take 40 mg by mouth daily., Disp: , Rfl:    insulin lispro (HUMALOG) 100 UNIT/ML injection, Inject 0-12 Units into the skin 3 (three) times daily with meals. Called [facility] and spoke with Nichole (nurse at facility); patient is ordered Humalog 0-12 units TID before meals per correction scale (was ordered on 07/31/21), Disp: , Rfl:    levothyroxine (SYNTHROID) 100 MCG tablet, Take 100  mcg by mouth daily before breakfast., Disp: , Rfl:    LORazepam (ATIVAN) 0.5 MG tablet, Take 1 tablet by mouth every 4 (four) hours as needed., Disp: , Rfl:    metoprolol succinate (TOPROL-XL) 50 MG 24 hr tablet, Take 50 mg by mouth daily. Take with or immediately following a meal., Disp: , Rfl:    Multiple Vitamin (MULTIVITAMIN ADULT) TABS, Take by mouth., Disp: , Rfl:    mupirocin cream (BACTROBAN) 2 %, Apply 1 application topically daily., Disp: , Rfl:    oxyCODONE-acetaminophen (PERCOCET/ROXICET) 5-325 MG tablet, Take 2 tablets by mouth every 4 (four) hours as needed for severe pain., Disp: , Rfl:    pantoprazole (PROTONIX) 20 MG tablet, Take 20 mg by mouth daily., Disp: , Rfl:    polyethylene glycol (MIRALAX / GLYCOLAX) 17 g packet, Take 17 g by mouth daily., Disp: , Rfl:    QUEtiapine (SEROQUEL) 25 MG tablet, Take 25 mg by mouth daily., Disp: , Rfl:    QUEtiapine (SEROQUEL) 25 MG tablet, Take 3 tablets by mouth at bedtime., Disp: , Rfl:    senna (SENOKOT) 8.6 MG TABS tablet, Take 2 tablets by mouth daily., Disp: , Rfl:    simvastatin (ZOCOR) 40 MG tablet, Take 40 mg by mouth daily., Disp: , Rfl:    tiotropium (SPIRIVA) 18 MCG inhalation capsule, Place 18 mcg into inhaler and inhale daily., Disp: , Rfl:    valsartan (DIOVAN) 80 MG tablet, Take 80 mg by mouth  daily., Disp: , Rfl:    vitamin C (ASCORBIC ACID) 500 MG tablet, Take 500 mg by mouth daily., Disp: , Rfl:    Cholecalciferol (VITAMIN D3) 50 MCG (2000 UT) TABS, Take by mouth. (Patient not taking: Reported on 09/15/2021), Disp: , Rfl:    diltiazem (CARTIA XT) 240 MG 24 hr capsule, Take 1 capsule (240 mg total) by mouth daily. (Patient not taking: Reported on 09/15/2021), Disp: 30 capsule, Rfl: 10   insulin detemir (LEVEMIR) 100 UNIT/ML injection, Inject 16 Units into the skin at bedtime. Called [facility] and spoke with Nichole (nurse at facility); pt was ordered Levemir 16 units QHS on 07/31/21, Disp: , Rfl:     ALLERGIES   Iodinated diagnostic agents, Benzocaine-chloroxylenol-hc, and Benzonatate    REVIEW OF SYSTEMS    Unable to perform due to dementia and acute altered mental status  PHYSICAL EXAMINATION   Vital Signs: Temp:  [91 F (32.8 C)-97.6 F (36.4 C)] 97.6 F (36.4 C) (11/23 1435) Pulse Rate:  [55-108] 59 (11/23 1725) Resp:  [11-33] 13 (11/23 1725) BP: (56-146)/(22-116) 118/51 (11/23 1725) SpO2:  [71 %-100 %] 93 % (11/23 1725) Weight:  [70 kg] 70 kg (11/22 2045)  GENERAL:Age appropriate aggitated  HEAD: Normocephalic, atraumatic.  EYES: Pupils equal, round, reactive to light.  No scleral icterus.  MOUTH: Moist mucosal membrane. NECK: Supple. No thyromegaly. No nodules. No JVD.  PULMONARY: rhonchi bilaterally with reduced breath sounds CARDIOVASCULAR: S1 and S2. Regular rate and rhythm. No murmurs, rubs, or gallops.  GASTROINTESTINAL: Soft, nontender, non-distended. No masses. Positive bowel sounds. No hepatosplenomegaly.  MUSCULOSKELETAL: No swelling, clubbing, or edema.  NEUROLOGIC: Mild distress due to acute illness + dementia SKIN:intact,warm,dry   PERTINENT DATA     Infusions:  sodium chloride Stopped (09/15/21 1730)   norepinephrine (LEVOPHED) Adult infusion 3 mcg/min (09/15/21 1737)   Scheduled Medications:  acetaminophen  1,000 mg Oral Once   [START  ON 09/16/2021] collagenase  1 application Topical Daily   [START ON 09/16/2021] fluticasone  2 spray Each  Nare Daily   insulin aspart  0-9 Units Subcutaneous TID AC & HS   levothyroxine  100 mcg Oral Q0600   lidocaine  1 patch Transdermal Q24H   [START ON 09/16/2021] mupirocin cream  1 application Topical Daily   pantoprazole  20 mg Oral Daily   QUEtiapine  25 mg Oral QHS   tiotropium  18 mcg Inhalation Daily   PRN Medications: acetaminophen **OR** acetaminophen, diphenhydrAMINE, fentaNYL (SUBLIMAZE) injection, haloperidol lactate, LORazepam, LORazepam, ondansetron **OR** ondansetron (ZOFRAN) IV, traZODone Hemodynamic parameters:   Intake/Output: 11/22 0701 - 11/23 0700 In: 1300 [IV Piggyback:1300] Out: -   Ventilator  Settings:    LAB RESULTS:  Basic Metabolic Panel: Recent Labs  Lab 09/14/21 2133 09/15/21 0530 09/15/21 1643  NA 144 139 142  K 4.0 4.2 4.0  CL 117* 114* 117*  CO2 20* 18* 18*  GLUCOSE 41* 287* 204*  BUN 58* 51* 46*  CREATININE 3.86* 3.25* 2.83*  CALCIUM 8.4* 7.9* 7.5*   Liver Function Tests: No results for input(s): AST, ALT, ALKPHOS, BILITOT, PROT, ALBUMIN in the last 168 hours. No results for input(s): LIPASE, AMYLASE in the last 168 hours. No results for input(s): AMMONIA in the last 168 hours. CBC: Recent Labs  Lab 09/14/21 2133 09/15/21 0530  WBC 6.1 6.4  NEUTROABS 5.0  --   HGB 9.2* 8.3*  HCT 29.2* 27.5*  MCV 93.6 93.2  PLT 201 195   Cardiac Enzymes: No results for input(s): CKTOTAL, CKMB, CKMBINDEX, TROPONINI in the last 168 hours. BNP: Invalid input(s): POCBNP CBG: Recent Labs  Lab 09/14/21 2305 09/15/21 0422 09/15/21 0824 09/15/21 1213 09/15/21 1422  GLUCAP 152* 205* 137* 133* 93       IMAGING RESULTS:  Imaging: CT ABDOMEN PELVIS WO CONTRAST  Result Date: 09/15/2021 CLINICAL DATA:  Left-sided abdominal pain, hypoglycemia, status post arrest EXAM: CT ABDOMEN AND PELVIS WITHOUT CONTRAST TECHNIQUE: Multidetector CT  imaging of the abdomen and pelvis was performed following the standard protocol without IV contrast. Unenhanced CT was performed per clinician order. Lack of IV contrast limits sensitivity and specificity, especially for evaluation of abdominal/pelvic solid viscera. COMPARISON:  07/19/2021 FINDINGS: Lower chest: No acute pleural or parenchymal lung disease. Hepatobiliary: Gallbladder is surgically absent. Chronic dilation of the common bile duct likely related to prior cholecystectomy. No focal liver abnormalities. Pancreas: Unremarkable. No pancreatic ductal dilatation or surrounding inflammatory changes. Spleen: Normal in size without focal abnormality. Adrenals/Urinary Tract: No urinary tract calculi or obstructive uropathy. Right renal cortical cyst. Bilateral renal cortical thinning. The adrenals and bladder are grossly unremarkable. Stomach/Bowel: No bowel obstruction or ileus. No bowel wall thickening or inflammatory change. Vascular/Lymphatic: Aortic atherosclerosis. No enlarged abdominal or pelvic lymph nodes. Reproductive: Uterus is atrophic.  No adnexal masses. Other: No free fluid or free intraperitoneal gas. No abdominal wall hernia. Musculoskeletal: Postsurgical changes from right hip arthroplasty. Subacute healing fractures are seen through the left iliac bone as well as the left superior and inferior pubic rami. Significant callus formation since prior exam. Acute bilateral sixth rib fractures are noted. No other acute bony abnormalities. Reconstructed images demonstrate no additional findings. IMPRESSION: 1. Acute minimally displaced bilateral sixth rib fractures. 2. Subacute healing fractures through the left iliac bone, superior ramus, and inferior ramus. 3. Chronic dilation of the common bile duct related to prior cholecystectomy. 4. Otherwise no acute intra-abdominal or intrapelvic process. Electronically Signed   By: Randa Ngo M.D.   On: 09/15/2021 00:02   CT Head Wo Contrast  Result  Date: 09/14/2021  CLINICAL DATA:  87 year old post fall.  Hypoglycemia. EXAM: CT HEAD WITHOUT CONTRAST TECHNIQUE: Contiguous axial images were obtained from the base of the skull through the vertex without intravenous contrast. COMPARISON:  Head CT 07/19/2021 FINDINGS: Brain: Stable degree of atrophy and chronic small vessel ischemia. No intracranial hemorrhage, mass effect, or midline shift. No hydrocephalus. The basilar cisterns are patent. No evidence of territorial infarct or acute ischemia. No extra-axial or intracranial fluid collection. Vascular: Atherosclerosis of skullbase vasculature without hyperdense vessel or abnormal calcification. Skull: No fracture or focal lesion. Sinuses/Orbits: Paranasal sinuses and mastoid air cells are clear. The visualized orbits are unremarkable. Other: Midline frontal scalp hematoma. IMPRESSION: 1. Midline frontal scalp hematoma. No acute intracranial abnormality. No skull fracture. 2. Stable atrophy and chronic small vessel ischemia. Electronically Signed   By: Keith Rake M.D.   On: 09/14/2021 22:38   CT Cervical Spine Wo Contrast  Result Date: 09/14/2021 CLINICAL DATA:  Unwitnessed fall.  Hypoglycemia. EXAM: CT CERVICAL SPINE WITHOUT CONTRAST TECHNIQUE: Multidetector CT imaging of the cervical spine was performed without intravenous contrast. Multiplanar CT image reconstructions were also generated. COMPARISON:  Cervical spine CT 07/19/2021 FINDINGS: Alignment: No broad-based leftward curvature, likely muscle spasm or positioning. Trace anterolisthesis of C2 on C3 and grade 1 anterolisthesis of C7 on T1 is unchanged. No traumatic subluxation. Skull base and vertebrae: No acute fracture. Vertebral body heights are maintained. The dens and skull base are intact. Soft tissues and spinal canal: No prevertebral fluid or swelling. No visible canal hematoma. Disc levels: Multilevel degenerative disc disease and facet hypertrophy. Stable degenerative change from prior.  Upper chest: Biapical pleuroparenchymal scarring. No acute findings. Other: None. IMPRESSION: Multilevel degenerative change in the cervical spine without acute fracture or subluxation. Electronically Signed   By: Keith Rake M.D.   On: 09/14/2021 22:42   DG Chest Portable 1 View  Result Date: 09/14/2021 CLINICAL DATA:  Altered mental status EXAM: PORTABLE CHEST 1 VIEW COMPARISON:  07/19/2021, 12/22/2019 FINDINGS: Left-sided pacing device as before. Cardiomegaly. Mild diffuse interstitial opacity likely due to chronic disease. No pleural effusion or pneumothorax. Aortic atherosclerosis. IMPRESSION: No active disease.  Mild cardiomegaly. Electronically Signed   By: Donavan Foil M.D.   On: 09/14/2021 21:50   @PROBHOSP @ CT ABDOMEN PELVIS WO CONTRAST  Result Date: 09/15/2021 CLINICAL DATA:  Left-sided abdominal pain, hypoglycemia, status post arrest EXAM: CT ABDOMEN AND PELVIS WITHOUT CONTRAST TECHNIQUE: Multidetector CT imaging of the abdomen and pelvis was performed following the standard protocol without IV contrast. Unenhanced CT was performed per clinician order. Lack of IV contrast limits sensitivity and specificity, especially for evaluation of abdominal/pelvic solid viscera. COMPARISON:  07/19/2021 FINDINGS: Lower chest: No acute pleural or parenchymal lung disease. Hepatobiliary: Gallbladder is surgically absent. Chronic dilation of the common bile duct likely related to prior cholecystectomy. No focal liver abnormalities. Pancreas: Unremarkable. No pancreatic ductal dilatation or surrounding inflammatory changes. Spleen: Normal in size without focal abnormality. Adrenals/Urinary Tract: No urinary tract calculi or obstructive uropathy. Right renal cortical cyst. Bilateral renal cortical thinning. The adrenals and bladder are grossly unremarkable. Stomach/Bowel: No bowel obstruction or ileus. No bowel wall thickening or inflammatory change. Vascular/Lymphatic: Aortic atherosclerosis. No enlarged  abdominal or pelvic lymph nodes. Reproductive: Uterus is atrophic.  No adnexal masses. Other: No free fluid or free intraperitoneal gas. No abdominal wall hernia. Musculoskeletal: Postsurgical changes from right hip arthroplasty. Subacute healing fractures are seen through the left iliac bone as well as the left superior and inferior pubic rami. Significant callus formation  since prior exam. Acute bilateral sixth rib fractures are noted. No other acute bony abnormalities. Reconstructed images demonstrate no additional findings. IMPRESSION: 1. Acute minimally displaced bilateral sixth rib fractures. 2. Subacute healing fractures through the left iliac bone, superior ramus, and inferior ramus. 3. Chronic dilation of the common bile duct related to prior cholecystectomy. 4. Otherwise no acute intra-abdominal or intrapelvic process. Electronically Signed   By: Randa Ngo M.D.   On: 09/15/2021 00:02   CT Head Wo Contrast  Result Date: 09/14/2021 CLINICAL DATA:  87 year old post fall.  Hypoglycemia. EXAM: CT HEAD WITHOUT CONTRAST TECHNIQUE: Contiguous axial images were obtained from the base of the skull through the vertex without intravenous contrast. COMPARISON:  Head CT 07/19/2021 FINDINGS: Brain: Stable degree of atrophy and chronic small vessel ischemia. No intracranial hemorrhage, mass effect, or midline shift. No hydrocephalus. The basilar cisterns are patent. No evidence of territorial infarct or acute ischemia. No extra-axial or intracranial fluid collection. Vascular: Atherosclerosis of skullbase vasculature without hyperdense vessel or abnormal calcification. Skull: No fracture or focal lesion. Sinuses/Orbits: Paranasal sinuses and mastoid air cells are clear. The visualized orbits are unremarkable. Other: Midline frontal scalp hematoma. IMPRESSION: 1. Midline frontal scalp hematoma. No acute intracranial abnormality. No skull fracture. 2. Stable atrophy and chronic small vessel ischemia.  Electronically Signed   By: Keith Rake M.D.   On: 09/14/2021 22:38   CT Cervical Spine Wo Contrast  Result Date: 09/14/2021 CLINICAL DATA:  Unwitnessed fall.  Hypoglycemia. EXAM: CT CERVICAL SPINE WITHOUT CONTRAST TECHNIQUE: Multidetector CT imaging of the cervical spine was performed without intravenous contrast. Multiplanar CT image reconstructions were also generated. COMPARISON:  Cervical spine CT 07/19/2021 FINDINGS: Alignment: No broad-based leftward curvature, likely muscle spasm or positioning. Trace anterolisthesis of C2 on C3 and grade 1 anterolisthesis of C7 on T1 is unchanged. No traumatic subluxation. Skull base and vertebrae: No acute fracture. Vertebral body heights are maintained. The dens and skull base are intact. Soft tissues and spinal canal: No prevertebral fluid or swelling. No visible canal hematoma. Disc levels: Multilevel degenerative disc disease and facet hypertrophy. Stable degenerative change from prior. Upper chest: Biapical pleuroparenchymal scarring. No acute findings. Other: None. IMPRESSION: Multilevel degenerative change in the cervical spine without acute fracture or subluxation. Electronically Signed   By: Keith Rake M.D.   On: 09/14/2021 22:42   DG Chest Portable 1 View  Result Date: 09/14/2021 CLINICAL DATA:  Altered mental status EXAM: PORTABLE CHEST 1 VIEW COMPARISON:  07/19/2021, 12/22/2019 FINDINGS: Left-sided pacing device as before. Cardiomegaly. Mild diffuse interstitial opacity likely due to chronic disease. No pleural effusion or pneumothorax. Aortic atherosclerosis. IMPRESSION: No active disease.  Mild cardiomegaly. Electronically Signed   By: Donavan Foil M.D.   On: 09/14/2021 21:50        ASSESSMENT AND PLAN    -Multidisciplinary rounds held today  Circulatory shock    - patient on levophed      -source is unclear possible cardiac due to code blue s/p CPR/ACLS with ROSC  -IVF -LR 2L  - patient already got 2L in ER with NaHCO3  infusion  -repeat TTE - previous 11/2019 - EF 65% with adequate parameters throughout    Renal Failure-chronic - hypertensive nephropathy -follow chem 7 -follow UO -continue Foley Catheter-assess need daily   Advanced dementia    Episodes of aggitation with sundowing  -possible septic encephalopathy concomitantly   Atrial fibrillation with RVR   -S/p ppm   - patient now on levophed - ICU telemetry monitoring    -  repeat TTE   Chronic COPD   Patient does not seem to have acute COPD exacerbation   ID -continue IV abx as prescibed -follow up cultures  GI/Nutrition GI PROPHYLAXIS as indicated DIET-->TF's as tolerated Constipation protocol as indicated  ENDO - ICU hypoglycemic\Hyperglycemia protocol -check FSBS per protocol   ELECTROLYTES -follow labs as needed -replace as needed -pharmacy consultation   DVT/GI PRX ordered -SCDs  TRANSFUSIONS AS NEEDED MONITOR FSBS ASSESS the need for LABS as needed   Critical care provider statement:   Total critical care time: 109 minutes   Performed by: Lanney Gins MD   Critical care time was exclusive of separately billable procedures and treating other patients.   Critical care was necessary to treat or prevent imminent or life-threatening deterioration.   Critical care was time spent personally by me on the following activities: development of treatment plan with patient and/or surrogate as well as nursing, discussions with consultants, evaluation of patient's response to treatment, examination of patient, obtaining history from patient or surrogate, ordering and performing treatments and interventions, ordering and review of laboratory studies, ordering and review of radiographic studies, pulse oximetry and re-evaluation of patient's condition.    Ottie Glazier, M.D.  Pulmonary & Critical Care Medicine

## 2021-09-15 NOTE — ED Notes (Signed)
Dr. Clayton Bibles to bedside to assess pt.

## 2021-09-15 NOTE — ED Notes (Signed)
IV team at bedside 

## 2021-09-15 NOTE — ED Notes (Signed)
Patient resting at this time.

## 2021-09-15 NOTE — H&P (Signed)
[place]   [name] NAME: Denise [name]    MR#:  [id number]  DATE OF BIRTH:  [birth year]  DATE OF ADMISSION:  09/14/2021  PRIMARY CARE PHYSICIAN: Rica Koyanagi, MD   [name] is coming from: Assisted living facility.  REQUESTING/REFERRING PHYSICIAN: Nance Pear, MD.  CHIEF COMPLAINT:   Chief Complaint  [name] presents with   Hypoglycemia    HISTORY OF PRESENT ILLNESS:  Denise [name] is a 87 y.o. female with medical history significant for atrial fibrillation, CKD, COPD, type diabetes mellitus, GERD, hypertension and hypothyroidism, presented to [facility] ER with acute onset of unresponsiveness when she was found slumped over a table in her assisted living facility.  She was initially thought to be in cardiac arrest and CPR was started.  By [facility] time EMS arrived however she had a pulse and was found to be hypoglycemic with blood glucose of 30.  She was given dextrose and was still low when she came to [facility] ER and therefore was given an amp of D50 followed by D5W infusion.  She was noted to be hypothermic and was placed on [employer].  No reported nausea or vomiting or abdominal pain.  No reported fever or chills.  No dysuria, oliguria or hematuria or flank pain.  ED Course: When she came to [facility] ER vital signs were within normal.  Labs revealed a high 60 troponin I 32 and a blood glucose of 41 with BUN of 58 and creatinine 3.86 above previous levels of 23/2.09 on 07/20/2021 with stage IV chronic kidney disease.  Influenza antigens and COVID-19 PCR Negative.. EKG as reviewed by me : EKG showed normal sinus rhythm with a rate of 60 with short PR interval and right atrial enlargement with suspected left bundle branch block Imaging: Noncontrast head CT scan revealed midline frontal scalp hematoma, atrophy and chronic small vessel ischemic changes with no acute intracranial normalities and C-spine CT showed multilevel degenerative changes in Denise C-spine without acute fracture or  subluxation PAST MEDICAL HISTORY:   Past Medical History:  Diagnosis Date   A-fib [facility])    Arthritis    Cataract    Chronic kidney disease    COPD (chronic obstructive pulmonary disease) ([place])    Diabetes mellitus without complication (HCC)    Emphysema of lung (HCC)    GERD (gastroesophageal reflux disease)    Hypertension    Hypothyroidism     PAST SURGICAL HISTORY:   Past Surgical History:  Procedure Laterality Date   AV FISTULA PLACEMENT Right 11/18/2015   Procedure: ARTERIOVENOUS (AV) FISTULA CREATION;  Surgeon: Algernon Huxley, MD;  Location: [facility] ORS;  Service: Vascular;  Laterality: Right;   CHOLECYSTECTOMY     GALLBLADDER SURGERY     HIP ARTHROPLASTY Right 07/31/2015   Procedure: ARTHROPLASTY BIPOLAR HIP (HEMIARTHROPLASTY);  Surgeon: Corky Mull, MD;  Location: [facility] ORS;  Service: Orthopedics;  Laterality: Right;   LIGATION OF ARTERIOVENOUS  FISTULA Right 03/14/2021   Procedure: LIGATION OF ARTERIOVENOUS  FISTULA  AND EVACUATION OF HEMATOMA;  Surgeon: Conrad Pleasant Hill, MD;  Location: [facility] ORS;  Service: Vascular;  Laterality: Right;   PACEMAKER IMPLANT N/A 11/29/2019   Procedure: PACEMAKER IMPLANT;  Surgeon: Constance Haw, MD;  Location: [place] [facility];  Service: Cardiovascular;  Laterality: N/A;   TEMPORARY PACEMAKER N/A 11/29/2019   Procedure: TEMPORARY PACEMAKER;  Surgeon: Isaias Cowman, MD;  Location: [place] [facility];  Service: Cardiovascular;  Laterality: N/A;    SOCIAL HISTORY:  Social History   Tobacco Use   Smoking status: Former    Types: Cigarettes    Quit date: 07/28/2013    Years since quitting: 8.1   Smokeless tobacco: Never  Substance Use Topics   Alcohol use: No    FAMILY HISTORY:   Family History  Problem Relation Age of Onset   Diabetes Mother    Heart attack Mother    Stomach cancer Father    Colon cancer Sister    Diabetes Sister    Heart attack Brother    Colon cancer Brother    Breast cancer Neg Hx     DRUG  ALLERGIES:   Allergies  Allergen Reactions   Iodinated Diagnostic Agents Anaphylaxis, Other (See Comments) and Rash   Benzocaine-Chloroxylenol-Hc     Years ago   Benzonatate Rash    Note: pruritis    REVIEW OF SYSTEMS:   ROS As per history of present illness. All pertinent systems were reviewed above. Constitutional, HEENT, cardiovascular, respiratory, GI, GU, musculoskeletal, neuro, psychiatric, endocrine, integumentary and hematologic systems were reviewed and are otherwise negative/unremarkable except for positive findings mentioned above in [facility] HPI.   MEDICATIONS AT HOME:   Prior to Admission medications   Medication Sig Start Date End Date Taking? Authorizing Provider  Cholecalciferol (VITAMIN D3) 50 MCG (2000 UT) TABS Take by mouth.    [provider]  collagenase (SANTYL) ointment Apply 1 application topically daily.    [provider]  diltiazem (CARTIA XT) 240 MG 24 hr capsule Take 1 capsule (240 mg total) by mouth daily. 09/10/21 09/10/22  Baldwin Jamaica, PA-C  fluticasone (FLONASE) 50 MCG/ACT nasal spray Place 2 sprays into both nostrils daily.    [provider]  insulin detemir (LEVEMIR) 100 UNIT/ML injection Inject 100 Units into Denise skin daily.    [provider]  insulin lispro (HUMALOG) 100 UNIT/ML injection Inject 100 Units into Denise skin 3 (three) times daily before meals.    [provider]  levothyroxine (SYNTHROID) 100 MCG tablet Take 100 mcg by mouth daily before breakfast.    [provider]  Multiple Vitamin (MULTIVITAMIN ADULT) TABS Take by mouth.    [provider]  mupirocin cream (BACTROBAN) 2 % Apply 1 application topically daily.    [provider]  oxyCODONE-acetaminophen (PERCOCET/ROXICET) 5-325 MG tablet Take 2 tablets by mouth every 4 (four) hours as needed for severe pain.    [provider]  pantoprazole (PROTONIX) 20 MG tablet Take 20 mg by mouth daily.    [provider]  polyethylene glycol (MIRALAX / GLYCOLAX) 17 g packet Take 17 g by mouth daily.    [provider]  QUEtiapine (SEROQUEL) 25 MG tablet Take 25 mg by mouth at bedtime.    [provider]  senna (SENOKOT) 8.6 MG TABS tablet Take 2 tablets by mouth daily.    [provider]  tiotropium (SPIRIVA) 18 MCG inhalation capsule Place 18 mcg into inhaler and inhale daily.    [provider]  vitamin C (ASCORBIC ACID) 500 MG tablet Take 500 mg by mouth daily.    [provider]      VITAL SIGNS:  Blood pressure (!) 132/55, pulse (!) 108, temperature (!) 96.1 F (35.6 C), temperature source Axillary, resp. rate 14, height 5\' 5"  (1.651 m), weight 70 kg, SpO2 (!) 71 %.  PHYSICAL EXAMINATION:  Physical Exam  GENERAL:  87 y.o.-year-old Caucasian female [name] lying in Den[facility] bed with no acute distress.  EYES: Pupils equal, round, reactive to light and accommodation. No scleral icterus. Extraocular muscles intact.  HEENT: Head atraumatic, normocephalic. Oropharynx with slightly dry mucous membrane and tongue and nasopharynx clear.  NECK:  Supple, no jugular venous distention. No thyroid enlargement, no tenderness.  LUNGS: Normal breath sounds bilaterally, no wheezing, rales,rhonchi or crepitation. No use of accessory muscles of respiration.  CARDIOVASCULAR: Regular rate and rhythm, S1, S2 normal. No murmurs, rubs, or gallops.  ABDOMEN: Soft, nondistended, nontender. Bowel sounds present. No organomegaly or mass.  EXTREMITIES: No pedal edema, cyanosis, or clubbing.  NEUROLOGIC: Cranial nerves II through XII are intact. Muscle strength 5/5 in all extremities. Sensation intact. Gait not checked.  PSYCHIATRIC: Denise [name] is alert and oriented x 3.  Normal affect and good eye contact. SKIN: No obvious rash, lesion, or ulcer.   LABORATORY PANEL:   CBC Recent Labs  Lab 09/14/21 2133  WBC 6.1  HGB 9.2*  HCT 29.2*  PLT 201    ------------------------------------------------------------------------------------------------------------------  Chemistries  Recent Labs  Lab 09/14/21 2133  NA 144  K 4.0  CL 117*  CO2 20*  GLUCOSE 41*  BUN 58*  CREATININE 3.86*  CALCIUM 8.4*   ------------------------------------------------------------------------------------------------------------------  Cardiac Enzymes No results for input(s): TROPONINI in Denise last 168 hours. ------------------------------------------------------------------------------------------------------------------  RADIOLOGY:  CT ABDOMEN PELVIS WO CONTRAST  Result Date: 09/15/2021 CLINICAL DATA:  Left-sided abdominal pain, hypoglycemia, status post arrest EXAM: CT ABDOMEN AND PELVIS WITHOUT CONTRAST TECHNIQUE: Multidetector CT imaging of Denise abdomen and pelvis was performed following [facility] standard protocol without IV contrast. Unenhanced CT was performed per clinician order. Lack of IV contrast limits sensitivity and specificity, especially for evaluation of abdominal/pelvic solid viscera. COMPARISON:  07/19/2021 FINDINGS: Lower chest: No acute pleural or parenchymal lung disease. Hepatobiliary: Gallbladder is surgically absent. Chronic dilation of Denise common bile duct likely related to prior cholecystectomy. No focal liver abnormalities. Pancreas: Unremarkable. No pancreatic ductal dilatation or surrounding inflammatory changes. Spleen: Normal in size without focal abnormality. Adrenals/Urinary Tract: No urinary tract calculi or obstructive uropathy. Right renal cortical cyst. Bilateral renal cortical thinning. Denise adrenals and bladder are grossly unremarkable. Stomach/Bowel: No bowel obstruction or ileus. No bowel wall thickening or inflammatory change. Vascular/Lymphatic: Aortic atherosclerosis. No enlarged abdominal or pelvic lymph nodes. Reproductive: Uterus is atrophic.  No adnexal masses. Other: No free fluid or free intraperitoneal gas. No  abdominal wall hernia. Musculoskeletal: Postsurgical changes from right hip arthroplasty. Subacute healing fractures are seen through Denise left iliac bone as well as Denise left superior and inferior pubic rami. Significant callus formation since prior exam. Acute bilateral sixth rib fractures are noted. No other acute bony abnormalities. Reconstructed images demonstrate no additional findings. IMPRESSION: 1. Acute minimally displaced bilateral sixth rib fractures. 2. Subacute healing fractures through Denise left iliac bone, superior ramus, and inferior ramus. 3. Chronic dilation of Denise common bile duct related to prior cholecystectomy. 4. Otherwise no acute intra-abdominal or intrapelvic process. Electronically Signed   By: Randa Ngo M.D.   On: 09/15/2021 00:02   CT Head Wo Contrast  Result Date: 09/14/2021 CLINICAL DATA:  87 year old post fall.  Hypoglycemia. EXAM: CT HEAD WITHOUT CONTRAST TECHNIQUE: Contiguous axial images were obtained from Denise base of Denise skull through Denise vertex without intravenous contrast. COMPARISON:  Head CT 07/19/2021 FINDINGS: Brain: Stable degree of atrophy and chronic small vessel ischemia. No intracranial hemorrhage, mass effect, or midline shift. No hydrocephalus. Denise basilar cisterns are patent. No evidence of territorial infarct or acute ischemia. No extra-axial or intracranial fluid  collection. Vascular: Atherosclerosis of skullbase vasculature without hyperdense vessel or abnormal calcification. Skull: No fracture or focal lesion. Sinuses/Orbits: Paranasal sinuses and mastoid air cells are clear. Denise visualized orbits are unremarkable. Other: Midline frontal scalp hematoma. IMPRESSION: 1. Midline frontal scalp hematoma. No acute intracranial abnormality. No skull fracture. 2. Stable atrophy and chronic small vessel ischemia. Electronically Signed   By: Keith Rake M.D.   On: 09/14/2021 22:38   CT Cervical Spine Wo Contrast  Result Date: 09/14/2021 CLINICAL DATA:   Unwitnessed fall.  Hypoglycemia. EXAM: CT CERVICAL SPINE WITHOUT CONTRAST TECHNIQUE: Multidetector CT imaging of Denise cervical spine was performed without intravenous contrast. Multiplanar CT image reconstructions were also generated. COMPARISON:  Cervical spine CT 07/19/2021 FINDINGS: Alignment: No broad-based leftward curvature, likely muscle spasm or positioning. Trace anterolisthesis of C2 on C3 and grade 1 anterolisthesis of C7 on T1 is unchanged. No traumatic subluxation. Skull base and vertebrae: No acute fracture. Vertebral body heights are maintained. Denise dens and skull base are intact. Soft tissues and spinal canal: No prevertebral fluid or swelling. No visible canal hematoma. Disc levels: Multilevel degenerative disc disease and facet hypertrophy. Stable degenerative change from prior. Upper chest: Biapical pleuroparenchymal scarring. No acute findings. Other: None. IMPRESSION: Multilevel degenerative change in Denise cervical spine without acute fracture or subluxation. Electronically Signed   By: Keith Rake M.D.   On: 09/14/2021 22:42   DG Chest Portable 1 View  Result Date: 09/14/2021 CLINICAL DATA:  Altered mental status EXAM: PORTABLE CHEST 1 VIEW COMPARISON:  07/19/2021, 12/22/2019 FINDINGS: Left-sided pacing device as before. Cardiomegaly. Mild diffuse interstitial opacity likely due to chronic disease. No pleural effusion or pneumothorax. Aortic atherosclerosis. IMPRESSION: No active disease.  Mild cardiomegaly. Electronically Signed   By: Donavan Foil M.D.   On: 09/14/2021 21:50      IMPRESSION AND PLAN:  Principal Problem:   Hypoglycemia  1.  Hypoglycemia with subsequent unresponsiveness. - Denise [name] will be admitted to a progressive unit bed. - She will be monitored with neurochecks every 4 hours for 24 hours. - She will be placed on frequent fingerstick blood glucose measures. - We will hold off her long-acting insulin.  2.  Acute kidney injury, superimposed on stage IV  chronic kidney disease, likely prerenal due to dehydration. - Denise [name] will be hydrated with IV normal saline and will follow her BMP. - We will hold off nephrotoxins.  3.  Paroxysmal atrial fibrillation. - We will continue cardia XT.  4.  Hypothyroidism. - We will continue Synthroid.  5.  Type II diabetes mellitus with chronic kidney disease. - Denise [name] will be placed on supplement coverage with NovoLog and will hold off Levemir.  6.  GERD. - We will continue PPI therapy.  7.  Dementia with anxiety. - We will continue her Ativan and Seroquel.  8.  COPD without exacerbation. - We will continue her Spiriva and place her on as needed DuoNebs. DVT prophylaxis: Lovenox. Code Status: Denise [name] is DNR/DNI. Family Communication:  Denise plan of care was discussed in details with Denise [name] (and family). I answered all questions. Denise [name] agreed to proceed with Denise above mentioned plan. Further management will depend upon hospital course. Disposition Plan: Back to previous home environment Consults called: none. All [facility] records are reviewed and case discussed with ED provider.  Status is: Inpatient   Remains inpatient appropriate because:Ongoing diagnostic testing needed not appropriate for outpatient work up, Unsafe d/c plan, IV treatments appropriate due to intensity of illness or  inability to take PO, and Inpatient level of care appropriate due to severity of illness   Dispo: Denise [name] is from: Home              Anticipated d/c is to: Home              [name] currently is not medically stable to d/c.              Difficult to place [name]: No  TOTAL TIME TAKING CARE OF THIS [name]: 55 minutes.     Christel Mormon M.D on 09/15/2021 at 4:56 AM  [facility]   From 7 PM-7 AM, contact night-coverage [email or web address]  CC: Primary care physician; Rica Koyanagi, MD

## 2021-09-15 NOTE — ED Notes (Addendum)
Dr. Ouida Sills notified pt's BP is 84/41, after repositioning and waking pt. Up BP is 98/47. Per MD, will restart levophed. IV team called to start larger bore IV asap.

## 2021-09-15 NOTE — ED Notes (Signed)
Spoke with pt's daughter and healthcare POA, updated on POC and answered questions and concerns.

## 2021-09-15 NOTE — ED Notes (Addendum)
EDT at bedside for 1:1 sitter. PT BP taken twice. First BP 74/24 and repeat BP 70/29. MAP is <65. MD made aware. No new orders at this time. NS bolus administered until further orders.   ------------------------------------------------------------------------------------------------ I was in direct communication with RN during this period in secure chat and immediately placed orders in regard to vital signs which can be verified by looking at the [facility] and order history. I also have screen shot of the secure chat messages.  Doristine Mango, DO

## 2021-09-15 NOTE — Progress Notes (Addendum)
VAST consult received to obtain 2nd IV access after patient pulled out one of her IVs.  [place] utilized to assess lower right arm; 20G USGIV placed.  Upon entering EMR to document IV placement, noted upper right arm fistula on avatar.Spoke with ER RN who was not given report that patient had a fistula. Upper right arm assessed and fistula not visible to naked eye. BP cuff removed from upper right arm and PIV removed immediately. Pink restricted extremity armband placed on patient's right arm. BP cuff placed on patient's left leg.  ER RN to notify MD.

## 2021-09-15 NOTE — ED Notes (Signed)
Physician notified pt noted w/ increased agitation . Sitter at bedside . Pt noted pulling on IV access and monitoring cables. Sitter at bedside with this pt at this time

## 2021-09-16 ENCOUNTER — Other Ambulatory Visit: Payer: Self-pay

## 2021-09-16 DIAGNOSIS — R6521 Severe sepsis with septic shock: Secondary | ICD-10-CM

## 2021-09-16 DIAGNOSIS — E162 Hypoglycemia, unspecified: Secondary | ICD-10-CM | POA: Diagnosis not present

## 2021-09-16 DIAGNOSIS — T68XXXA Hypothermia, initial encounter: Secondary | ICD-10-CM | POA: Diagnosis not present

## 2021-09-16 DIAGNOSIS — A419 Sepsis, unspecified organism: Secondary | ICD-10-CM | POA: Insufficient documentation

## 2021-09-16 DIAGNOSIS — S2243XA Multiple fractures of ribs, bilateral, initial encounter for closed fracture: Secondary | ICD-10-CM | POA: Diagnosis not present

## 2021-09-16 LAB — CBG MONITORING, ED
Glucose-Capillary: 93 mg/dL (ref 70–99)
Glucose-Capillary: 86 mg/dL (ref 70–99)
Glucose-Capillary: 92 mg/dL (ref 70–99)
Glucose-Capillary: 100 mg/dL — ABNORMAL HIGH (ref 70–99)

## 2021-09-16 LAB — BASIC METABOLIC PANEL
Anion gap: 7 (ref 5–15)
BUN: 42 mg/dL — ABNORMAL HIGH (ref 8–23)
CO2: 24 mmol/L (ref 22–32)
Calcium: 7.8 mg/dL — ABNORMAL LOW (ref 8.9–10.3)
Chloride: 115 mmol/L — ABNORMAL HIGH (ref 98–111)
Creatinine, Ser: 2.49 mg/dL — ABNORMAL HIGH (ref 0.44–1.00)
GFR, Estimated: 18 mL/min — ABNORMAL LOW (ref >60)
Glucose, Bld: 94 mg/dL (ref 70–99)
Potassium: 3.9 mmol/L (ref 3.5–5.1)
Sodium: 146 mmol/L — ABNORMAL HIGH (ref 135–145)

## 2021-09-16 LAB — PROCALCITONIN: Procalcitonin: 0.18 ng/mL

## 2021-09-16 MED ORDER — ACETAMINOPHEN 650 MG RE SUPP
650.0000 mg | Freq: Three times a day (TID) | RECTAL | Status: AC
  Administered 2021-09-16 – 2021-09-17 (×2): 650 mg via RECTAL
  Filled 2021-09-16 (×2): qty 1
  Filled 2021-09-16 (×2): qty 2

## 2021-09-16 MED ORDER — ACETAMINOPHEN 325 MG PO TABS: 650.0000 mg | ORAL_TABLET | Freq: Three times a day (TID) | ORAL | Status: AC

## 2021-09-16 MED ORDER — INSULIN ASPART 100 UNIT/ML IJ SOLN
0.0000 [IU] | INTRAMUSCULAR | Status: DC
  Administered 2021-09-17: 2 [IU] via SUBCUTANEOUS
  Administered 2021-09-17 – 2021-09-18 (×3): 1 [IU] via SUBCUTANEOUS
  Administered 2021-09-18: 2 [IU] via SUBCUTANEOUS
  Administered 2021-09-18 – 2021-09-19 (×5): 1 [IU] via SUBCUTANEOUS
  Filled 2021-09-16 (×10): qty 1

## 2021-09-16 MED ORDER — DEXTROSE IN LACTATED RINGERS 5 % IV SOLN
INTRAVENOUS | Status: DC
Start: 2021-09-16 — End: 2021-09-18

## 2021-09-16 MED ORDER — METOPROLOL TARTRATE 5 MG/5ML IV SOLN
5.0000 mg | Freq: Once | INTRAVENOUS | Status: AC
  Administered 2021-09-17: 5 mg via INTRAVENOUS
  Filled 2021-09-16: qty 5

## 2021-09-16 MED ORDER — FENTANYL CITRATE PF 50 MCG/ML IJ SOSY
12.5000 ug | PREFILLED_SYRINGE | INTRAMUSCULAR | Status: DC | PRN
  Administered 2021-09-16 – 2021-09-17 (×2): 25 ug via INTRAVENOUS
  Filled 2021-09-16 (×2): qty 1

## 2021-09-16 NOTE — Progress Notes (Signed)
Pain medication administered to patient.

## 2021-09-16 NOTE — ED Notes (Signed)
Pt has redness and warmth to the left arm. The redness extends from her fingers to elbow. Pt also has red/purple bruising to the left bicep.

## 2021-09-16 NOTE — Progress Notes (Addendum)
CRITICAL CARE CONSULTATION    Name: Denise [name] MRN: [id number] DOB: [birth year]     LOS: 1   SUBJECTIVE FINDINGS & SIGNIFICANT EVENTS    Patient description:  This is a 87yo F with hx of HTN, AF, DM2, chronic rhinits, ckd4, emphysema, hypothyroidism, chronic LE edema, physical deconditioning and Alzheimers dementia who came in after having cardiac arrest at home with CPR performed. She is in ER with confusion, rib fractures, pelvic fractures.  She has developed circulatory shock while in MICU and requires vasopressor support.  Patient admitted to stepdown with [facility] service with PCCM consultation for further evaluation and management.   09/16/21- patient is off pressors and is on room air.  I recommend transfer to medical floor or PCU.  Additionally patient should have palliative evaluation due to advanced age DNR/DNI status with severe comorbidities chronically and innappropirate for aggressive therapy.  Lines/tubes :   Microbiology/Sepsis markers: Results for orders placed or performed during the hospital encounter of 09/14/21  Blood culture (routine x 2)     Status: None (Preliminary result)   Collection Time: 09/14/21  9:37 PM   Specimen: Right Antecubital; Blood  Result Value Ref Range Status   Specimen Description RIGHT ANTECUBITAL  Final   Special Requests   Final    BOTTLES DRAWN AEROBIC AND ANAEROBIC Blood Culture adequate volume   Culture   Final    NO GROWTH 2 DAYS Performed at [facility], [address] [place], [place] [address]    Report Status PENDING  Incomplete  Blood culture (routine x 2)     Status: None (Preliminary result)   Collection Time: 09/14/21  9:42 PM   Specimen: BLOOD LEFT HAND  Result Value Ref Range Status   Specimen Description BLOOD LEFT HAND  Final    Special Requests   Final    BOTTLES DRAWN AEROBIC AND ANAEROBIC Blood Culture adequate volume   Culture   Final    NO GROWTH 2 DAYS Performed at [facility], [address], [place], [place] [address]    Report Status PENDING  Incomplete  Resp Panel by RT-PCR (Flu A&B, Covid) Nasopharyngeal Swab     Status: None   Collection Time: 09/14/21 10:46 PM   Specimen: Nasopharyngeal Swab; Nasopharyngeal(NP) swabs in vial transport medium  Result Value Ref Range Status   SARS Coronavirus 2 by RT PCR NEGATIVE NEGATIVE Final    Comment: (NOTE) SARS-CoV-2 target nucleic acids are NOT DETECTED.  The SARS-CoV-2 RNA is generally detectable in upper respiratory specimens during the acute phase of infection. The lowest concentration of SARS-CoV-2 viral copies this assay can detect is 138 copies/mL. A negative result does not preclude SARS-Cov-2 infection and should not be used as the sole basis for treatment or other patient management decisions. A negative result may occur with  improper specimen collection/handling, submission of specimen other than nasopharyngeal swab, presence of viral mutation(s) within the areas targeted by this assay, and inadequate number of viral copies(<138 copies/mL). A negative result must be combined with clinical observations, patient history, and epidemiological information. The expected result is Negative.  Fact Sheet for Patients:  EntrepreneurPulse.com.au  Fact Sheet for Healthcare Providers:  IncredibleEmployment.be  This test is no t yet approved or cleared by the [place] FDA and  has been authorized for detection and/or diagnosis of SARS-CoV-2 by FDA under an Emergency Use Authorization (EUA). This EUA will remain  in effect (meaning this test can be used) for the duration of the  COVID-19 declaration under Section 564(b)(1) of the Act, 21 U.S.C.section 360bbb-3(b)(1), unless the authorization is terminated   or revoked sooner.       Influenza A by PCR NEGATIVE NEGATIVE Final   Influenza B by PCR NEGATIVE NEGATIVE Final    Comment: (NOTE) The Xpert Xpress SARS-CoV-2/FLU/RSV plus assay is intended as an aid in the diagnosis of influenza from Nasopharyngeal swab specimens and should not be used as a sole basis for treatment. Nasal washings and aspirates are unacceptable for Xpert Xpress SARS-CoV-2/FLU/RSV testing.  Fact Sheet for Patients: EntrepreneurPulse.com.au  Fact Sheet for Healthcare Providers: IncredibleEmployment.be  This test is not yet approved or cleared by the [place] FDA and has been authorized for detection and/or diagnosis of SARS-CoV-2 by FDA under an Emergency Use Authorization (EUA). This EUA will remain in effect (meaning this test can be used) for the duration of the COVID-19 declaration under Section 564(b)(1) of the Act, 21 U.S.C. section 360bbb-3(b)(1), unless the authorization is terminated or revoked.  Performed at [facility], [place], [place] [address]   Respiratory (~20 pathogens) panel by PCR     Status: None   Collection Time: 09/15/21  6:11 PM   Specimen: Nasopharyngeal Swab; Respiratory  Result Value Ref Range Status   Adenovirus NOT DETECTED NOT DETECTED Final   Coronavirus 229E NOT DETECTED NOT DETECTED Final    Comment: (NOTE) The Coronavirus on the Respiratory Panel, DOES NOT test for the novel  Coronavirus (2019 nCoV)    Coronavirus HKU1 NOT DETECTED NOT DETECTED Final   Coronavirus NL63 NOT DETECTED NOT DETECTED Final   Coronavirus OC43 NOT DETECTED NOT DETECTED Final   Metapneumovirus NOT DETECTED NOT DETECTED Final   Rhinovirus / Enterovirus NOT DETECTED NOT DETECTED Final   Influenza A NOT DETECTED NOT DETECTED Final   Influenza B NOT DETECTED NOT DETECTED Final   Parainfluenza Virus 1 NOT DETECTED NOT DETECTED Final   Parainfluenza Virus 2 NOT DETECTED NOT DETECTED  Final   Parainfluenza Virus 3 NOT DETECTED NOT DETECTED Final   Parainfluenza Virus 4 NOT DETECTED NOT DETECTED Final   Respiratory Syncytial Virus NOT DETECTED NOT DETECTED Final   Bordetella pertussis NOT DETECTED NOT DETECTED Final   Bordetella Parapertussis NOT DETECTED NOT DETECTED Final   Chlamydophila pneumoniae NOT DETECTED NOT DETECTED Final   Mycoplasma pneumoniae NOT DETECTED NOT DETECTED Final    Comment: Performed at [facility], [place]. [address] [place], [place] [address]    Anti-infectives:  Anti-infectives (From admission, onward)    Start     Dose/Rate Route Frequency Ordered Stop   09/14/21 2330  ceFEPIme (MAXIPIME) 2 g in sodium chloride 0.9 % 100 mL IVPB        2 g 200 mL/hr over 30 Minutes Intravenous  Once 09/14/21 2329 09/15/21 0027   09/14/21 2330  vancomycin (VANCOCIN) IVPB 1000 mg/200 mL premix        1,000 mg 200 mL/hr over 60 Minutes Intravenous  Once 09/14/21 2329 09/15/21 0330       PAST MEDICAL HISTORY   Past Medical History:  Diagnosis Date   A-fib [facility])    Arthritis    Cataract    Chronic kidney disease    COPD (chronic obstructive pulmonary disease) ([place])    Diabetes mellitus without complication ([place])    Emphysema of lung ([place])    GERD (gastroesophageal reflux disease)    Hypertension    Hypothyroidism      SURGICAL HISTORY  Past Surgical History:  Procedure Laterality Date   AV FISTULA PLACEMENT Right 11/18/2015   Procedure: ARTERIOVENOUS (AV) FISTULA CREATION;  Surgeon: Algernon Huxley, MD;  Location: [facility] ORS;  Service: Vascular;  Laterality: Right;   CHOLECYSTECTOMY     GALLBLADDER SURGERY     HIP ARTHROPLASTY Right 07/31/2015   Procedure: ARTHROPLASTY BIPOLAR HIP (HEMIARTHROPLASTY);  Surgeon: Corky Mull, MD;  Location: [facility] ORS;  Service: Orthopedics;  Laterality: Right;   LIGATION OF ARTERIOVENOUS  FISTULA Right 03/14/2021   Procedure: LIGATION OF ARTERIOVENOUS  FISTULA  AND EVACUATION OF HEMATOMA;  Surgeon: Conrad [place], MD;  Location: [facility] ORS;  Service: Vascular;  Laterality: Right;   PACEMAKER IMPLANT N/A 11/29/2019   Procedure: PACEMAKER IMPLANT;  Surgeon: Constance Haw, MD;  Location: [place] [facility] LAB;  Service: Cardiovascular;  Laterality: N/A;   TEMPORARY PACEMAKER N/A 11/29/2019   Procedure: TEMPORARY PACEMAKER;  Surgeon: Isaias Cowman, MD;  Location: [place] [facility] LAB;  Service: Cardiovascular;  Laterality: N/A;     FAMILY HISTORY   Family History  Problem Relation Age of Onset   Diabetes Mother    Heart attack Mother    Stomach cancer Father    Colon cancer Sister    Diabetes Sister    Heart attack Brother    Colon cancer Brother    Breast cancer Neg Hx      SOCIAL HISTORY   Social History   Tobacco Use   Smoking status: Former    Types: Cigarettes    Quit date: 07/28/2013    Years since quitting: 8.1   Smokeless tobacco: Never  Substance Use Topics   Alcohol use: No   Drug use: No     MEDICATIONS   Current Medication:  Current Facility-Administered Medications:    0.9 %  sodium chloride infusion, 250 mL, Intravenous, Continuous, Dorothe Pea, RPH, Stopped at 09/15/21 1730   acetaminophen (TYLENOL) tablet 650 mg, 650 mg, Oral, Q8H **OR** acetaminophen (TYLENOL) suppository 650 mg, 650 mg, Rectal, Q8H, Doristine Mango L, MD   acetaminophen (TYLENOL) tablet 1,000 mg, 1,000 mg, Oral, Once, Nance Pear, MD   collagenase (SANTYL) ointment 1 application, 1 application, Topical, Daily, Mansy, Jan A, MD   diphenhydrAMINE (BENADRYL) injection 25 mg, 25 mg, Intravenous, QHS PRN, Mansy, Jan A, MD, 25 mg at 09/16/21 4765   fentaNYL (SUBLIMAZE) injection 12.5-25 mcg, 12.5-25 mcg, Intravenous, Q2H PRN, Richarda Osmond, MD, 25 mcg at 09/16/21 1030   fluticasone (FLONASE) 50 MCG/ACT nasal spray 2 spray, 2 spray, Each Nare, Daily, Mansy, Jan A, MD   haloperidol lactate (HALDOL) injection 2 mg, 2 mg, Intramuscular, Q6H PRN, Mansy, Jan A, MD, 2 mg at 09/15/21  0431   insulin aspart (novoLOG) injection 0-9 Units, 0-9 Units, Subcutaneous, TID AC & HS, Mansy, Jan A, MD, 2 Units at 09/15/21 1757   levothyroxine (SYNTHROID) tablet 100 mcg, 100 mcg, Oral, Q0600, Mansy, Jan A, MD, 100 mcg at 09/16/21 0620   lidocaine (LIDODERM) 5 % 1 patch, 1 patch, Transdermal, Q24H, Richarda Osmond, MD, 1 patch at 09/15/21 1728   LORazepam (ATIVAN) injection 0.5 mg, 0.5 mg, Intravenous, Q4H PRN, Mansy, Jan A, MD, 0.5 mg at 09/16/21 4650   LORazepam (ATIVAN) injection 0.5 mg, 0.5 mg, Intramuscular, Q4H PRN, Richarda Osmond, MD   mupirocin cream (BACTROBAN) 2 % 1 application, 1 application, Topical, Daily, Mansy, Jan A, MD   norepinephrine (LEVOPHED) 4mg  in 236mL premix infusion, 2-10 mcg/min, Intravenous, Titrated, Dorothe Pea, RPH,  Paused at 09/16/21 0752   ondansetron (ZOFRAN) tablet 4 mg, 4 mg, Oral, Q6H PRN **OR** ondansetron (ZOFRAN) injection 4 mg, 4 mg, Intravenous, Q6H PRN, Mansy, Jan A, MD   pantoprazole (PROTONIX) EC tablet 20 mg, 20 mg, Oral, Daily, Mansy, Jan A, MD   QUEtiapine (SEROQUEL) tablet 25 mg, 25 mg, Oral, QHS, Mansy, Jan A, MD   sodium bicarbonate 150 mEq in sterile water 1,150 mL infusion, , Intravenous, Continuous, Richarda Osmond, MD, Last Rate: 175 mL/hr at 09/16/21 0703, New Bag at 09/16/21 0703   tiotropium [facility]) inhalation capsule ([facility] use ONLY) 18 mcg, 18 mcg, Inhalation, Daily, Mansy, Jan A, MD   traZODone (DESYREL) tablet 25 mg, 25 mg, Oral, QHS PRN, Mansy, Jan A, MD  Current Outpatient Medications:    allopurinol (ZYLOPRIM) 100 MG tablet, Take 100 mg by mouth daily., Disp: , Rfl:    cetirizine (ZYRTEC) 5 MG tablet, Take 5 mg by mouth daily., Disp: , Rfl:    collagenase (SANTYL) ointment, Apply 1 application topically daily., Disp: , Rfl:    diltiazem (CARDIZEM CD) 120 MG 24 hr capsule, Take 120 mg by mouth daily., Disp: , Rfl:    fluticasone (FLONASE) 50 MCG/ACT nasal spray, Place 2 sprays into both nostrils daily.,  Disp: , Rfl:    furosemide (LASIX) 40 MG tablet, Take 40 mg by mouth daily., Disp: , Rfl:    insulin lispro (HUMALOG) 100 UNIT/ML injection, Inject 0-12 Units into the skin 3 (three) times daily with meals. Called [facility] and spoke with Nichole (nurse at facility); patient is ordered Humalog 0-12 units TID before meals per correction scale (was ordered on 07/31/21), Disp: , Rfl:    levothyroxine (SYNTHROID) 100 MCG tablet, Take 100 mcg by mouth daily before breakfast., Disp: , Rfl:    LORazepam (ATIVAN) 0.5 MG tablet, Take 1 tablet by mouth every 4 (four) hours as needed., Disp: , Rfl:    metoprolol succinate (TOPROL-XL) 50 MG 24 hr tablet, Take 50 mg by mouth daily. Take with or immediately following a meal., Disp: , Rfl:    Multiple Vitamin (MULTIVITAMIN ADULT) TABS, Take by mouth., Disp: , Rfl:    mupirocin cream (BACTROBAN) 2 %, Apply 1 application topically daily., Disp: , Rfl:    oxyCODONE-acetaminophen (PERCOCET/ROXICET) 5-325 MG tablet, Take 2 tablets by mouth every 4 (four) hours as needed for severe pain., Disp: , Rfl:    pantoprazole (PROTONIX) 20 MG tablet, Take 20 mg by mouth daily., Disp: , Rfl:    polyethylene glycol (MIRALAX / GLYCOLAX) 17 g packet, Take 17 g by mouth daily., Disp: , Rfl:    QUEtiapine (SEROQUEL) 25 MG tablet, Take 25 mg by mouth daily., Disp: , Rfl:    QUEtiapine (SEROQUEL) 25 MG tablet, Take 3 tablets by mouth at bedtime., Disp: , Rfl:    senna (SENOKOT) 8.6 MG TABS tablet, Take 2 tablets by mouth daily., Disp: , Rfl:    simvastatin (ZOCOR) 40 MG tablet, Take 40 mg by mouth daily., Disp: , Rfl:    tiotropium (SPIRIVA) 18 MCG inhalation capsule, Place 18 mcg into inhaler and inhale daily., Disp: , Rfl:    valsartan (DIOVAN) 80 MG tablet, Take 80 mg by mouth daily., Disp: , Rfl:    vitamin C (ASCORBIC ACID) 500 MG tablet, Take 500 mg by mouth daily., Disp: , Rfl:    Cholecalciferol (VITAMIN D3) 50 MCG (2000 UT) TABS, Take by mouth. (Patient not taking: Reported on  09/15/2021), Disp: , Rfl:    diltiazem (  CARTIA XT) 240 MG 24 hr capsule, Take 1 capsule (240 mg total) by mouth daily. (Patient not taking: Reported on 09/15/2021), Disp: 30 capsule, Rfl: 10   insulin detemir (LEVEMIR) 100 UNIT/ML injection, Inject 16 Units into the skin at bedtime. Called [facility] and spoke with Nichole (nurse at facility); pt was ordered Levemir 16 units QHS on 07/31/21, Disp: , Rfl:     ALLERGIES   Iodinated diagnostic agents, Benzocaine-chloroxylenol-hc, and Benzonatate    REVIEW OF SYSTEMS    Unable to perform due to dementia and acute altered mental status  PHYSICAL EXAMINATION   Vital Signs: Temp:  [97.6 F (36.4 C)] 97.6 F (36.4 C) (11/23 1435) Pulse Rate:  [44-94] 94 (11/24 0830) Resp:  [13-33] 18 (11/24 0830) BP: (77-179)/(32-150) 135/123 (11/24 0830) SpO2:  [76 %-100 %] 94 % (11/24 0830)  GENERAL:Age appropriate aggitated  HEAD: Normocephalic, atraumatic.  EYES: Pupils equal, round, reactive to light.  No scleral icterus.  MOUTH: Moist mucosal membrane. NECK: Supple. No thyromegaly. No nodules. No JVD.  PULMONARY: decreased air entry bilaterally  CARDIOVASCULAR: S1 and S2. Regular rate and rhythm. No murmurs, rubs, or gallops.  GASTROINTESTINAL: Soft, nontender, non-distended. No masses. Positive bowel sounds. No hepatosplenomegaly.  MUSCULOSKELETAL: erythema of dorsal aspect of upper extermities.  NEUROLOGIC: Mild distress due to acute illness + dementia SKIN:intact,warm,dry   PERTINENT DATA     Infusions:  sodium chloride Stopped (09/15/21 1730)   norepinephrine (LEVOPHED) Adult infusion Stopped (09/16/21 0752)    sodium bicarbonate (isotonic) infusion in sterile water 175 mL/hr at 09/16/21 0703   Scheduled Medications:  acetaminophen  650 mg Oral Q8H   Or   acetaminophen  650 mg Rectal Q8H   acetaminophen  1,000 mg Oral Once   collagenase  1 application Topical Daily   fluticasone  2 spray Each Nare Daily   insulin aspart  0-9 Units  Subcutaneous TID AC & HS   levothyroxine  100 mcg Oral Q0600   lidocaine  1 patch Transdermal Q24H   mupirocin cream  1 application Topical Daily   pantoprazole  20 mg Oral Daily   QUEtiapine  25 mg Oral QHS   tiotropium  18 mcg Inhalation Daily   PRN Medications: diphenhydrAMINE, fentaNYL (SUBLIMAZE) injection, haloperidol lactate, LORazepam, LORazepam, ondansetron **OR** ondansetron (ZOFRAN) IV, traZODone Hemodynamic parameters:   Intake/Output: No intake/output data recorded.  Ventilator  Settings:    LAB RESULTS:  Basic Metabolic Panel: Recent Labs  Lab 09/14/21 2133 09/15/21 0530 09/15/21 1643 09/16/21 0756  NA 144 139 142 146*  K 4.0 4.2 4.0 3.9  CL 117* 114* 117* 115*  CO2 20* 18* 18* 24  GLUCOSE 41* 287* 204* 94  BUN 58* 51* 46* 42*  CREATININE 3.86* 3.25* 2.83* 2.49*  CALCIUM 8.4* 7.9* 7.5* 7.8*    Liver Function Tests: No results for input(s): AST, ALT, ALKPHOS, BILITOT, PROT, ALBUMIN in the last 168 hours. No results for input(s): LIPASE, AMYLASE in the last 168 hours. No results for input(s): AMMONIA in the last 168 hours. CBC: Recent Labs  Lab 09/14/21 2133 09/15/21 0530  WBC 6.1 6.4  NEUTROABS 5.0  --   HGB 9.2* 8.3*  HCT 29.2* 27.5*  MCV 93.6 93.2  PLT 201 195    Cardiac Enzymes: No results for input(s): CKTOTAL, CKMB, CKMBINDEX, TROPONINI in the last 168 hours. BNP: Invalid input(s): POCBNP CBG: Recent Labs  Lab 09/15/21 1422 09/15/21 1752 09/15/21 2009 09/15/21 2211 09/16/21 0904  GLUCAP 93 195* 132* 98 93  IMAGING RESULTS:  Imaging: CT ABDOMEN PELVIS WO CONTRAST  Result Date: 09/15/2021 CLINICAL DATA:  Left-sided abdominal pain, hypoglycemia, status post arrest EXAM: CT ABDOMEN AND PELVIS WITHOUT CONTRAST TECHNIQUE: Multidetector CT imaging of the abdomen and pelvis was performed following the standard protocol without IV contrast. Unenhanced CT was performed per clinician order. Lack of IV contrast limits  sensitivity and specificity, especially for evaluation of abdominal/pelvic solid viscera. COMPARISON:  07/19/2021 FINDINGS: Lower chest: No acute pleural or parenchymal lung disease. Hepatobiliary: Gallbladder is surgically absent. Chronic dilation of the common bile duct likely related to prior cholecystectomy. No focal liver abnormalities. Pancreas: Unremarkable. No pancreatic ductal dilatation or surrounding inflammatory changes. Spleen: Normal in size without focal abnormality. Adrenals/Urinary Tract: No urinary tract calculi or obstructive uropathy. Right renal cortical cyst. Bilateral renal cortical thinning. The adrenals and bladder are grossly unremarkable. Stomach/Bowel: No bowel obstruction or ileus. No bowel wall thickening or inflammatory change. Vascular/Lymphatic: Aortic atherosclerosis. No enlarged abdominal or pelvic lymph nodes. Reproductive: Uterus is atrophic.  No adnexal masses. Other: No free fluid or free intraperitoneal gas. No abdominal wall hernia. Musculoskeletal: Postsurgical changes from right hip arthroplasty. Subacute healing fractures are seen through the left iliac bone as well as the left superior and inferior pubic rami. Significant callus formation since prior exam. Acute bilateral sixth rib fractures are noted. No other acute bony abnormalities. Reconstructed images demonstrate no additional findings. IMPRESSION: 1. Acute minimally displaced bilateral sixth rib fractures. 2. Subacute healing fractures through the left iliac bone, superior ramus, and inferior ramus. 3. Chronic dilation of the common bile duct related to prior cholecystectomy. 4. Otherwise no acute intra-abdominal or intrapelvic process. Electronically Signed   By: Randa Ngo M.D.   On: 09/15/2021 00:02   CT Head Wo Contrast  Result Date: 09/14/2021 CLINICAL DATA:  87 year old post fall.  Hypoglycemia. EXAM: CT HEAD WITHOUT CONTRAST TECHNIQUE: Contiguous axial images were obtained from the base of the skull  through the vertex without intravenous contrast. COMPARISON:  Head CT 07/19/2021 FINDINGS: Brain: Stable degree of atrophy and chronic small vessel ischemia. No intracranial hemorrhage, mass effect, or midline shift. No hydrocephalus. The basilar cisterns are patent. No evidence of territorial infarct or acute ischemia. No extra-axial or intracranial fluid collection. Vascular: Atherosclerosis of skullbase vasculature without hyperdense vessel or abnormal calcification. Skull: No fracture or focal lesion. Sinuses/Orbits: Paranasal sinuses and mastoid air cells are clear. The visualized orbits are unremarkable. Other: Midline frontal scalp hematoma. IMPRESSION: 1. Midline frontal scalp hematoma. No acute intracranial abnormality. No skull fracture. 2. Stable atrophy and chronic small vessel ischemia. Electronically Signed   By: Keith Rake M.D.   On: 09/14/2021 22:38   CT Cervical Spine Wo Contrast  Result Date: 09/14/2021 CLINICAL DATA:  Unwitnessed fall.  Hypoglycemia. EXAM: CT CERVICAL SPINE WITHOUT CONTRAST TECHNIQUE: Multidetector CT imaging of the cervical spine was performed without intravenous contrast. Multiplanar CT image reconstructions were also generated. COMPARISON:  Cervical spine CT 07/19/2021 FINDINGS: Alignment: No broad-based leftward curvature, likely muscle spasm or positioning. Trace anterolisthesis of C2 on C3 and grade 1 anterolisthesis of C7 on T1 is unchanged. No traumatic subluxation. Skull base and vertebrae: No acute fracture. Vertebral body heights are maintained. The dens and skull base are intact. Soft tissues and spinal canal: No prevertebral fluid or swelling. No visible canal hematoma. Disc levels: Multilevel degenerative disc disease and facet hypertrophy. Stable degenerative change from prior. Upper chest: Biapical pleuroparenchymal scarring. No acute findings. Other: None. IMPRESSION: Multilevel degenerative change in the cervical spine  without acute fracture or  subluxation. Electronically Signed   By: Keith Rake M.D.   On: 09/14/2021 22:42   DG Chest Portable 1 View  Result Date: 09/14/2021 CLINICAL DATA:  Altered mental status EXAM: PORTABLE CHEST 1 VIEW COMPARISON:  07/19/2021, 12/22/2019 FINDINGS: Left-sided pacing device as before. Cardiomegaly. Mild diffuse interstitial opacity likely due to chronic disease. No pleural effusion or pneumothorax. Aortic atherosclerosis. IMPRESSION: No active disease.  Mild cardiomegaly. Electronically Signed   By: Donavan Foil M.D.   On: 09/14/2021 21:50   @PROBHOSP @ No results found.      ASSESSMENT AND PLAN    -Multidisciplinary rounds held today  Circulatory shock-  RESOLVED    -Shock physiology resolved post rehydration fluid delivery     - patient off levophed on room air in no distress     -source is unclear possible dehydration while at [facility]    - she had CPR but it was unclear if she actually lost pulse at any time.  -repeat TTE - previous 11/2019 - EF 65% with adequate parameters throughout    Renal Failure-chronic - hypertensive nephropathy -follow chem 7 -follow UO -continue Foley Catheter-assess need daily   Advanced dementia    Episodes of aggitation with sundowing  -possible septic encephalopathy concomitantly    Atrial fibrillation -chronic -rate controlled    -S/p ppm   - patient now off levophed - telemetry monitoring    - repeat TTE   Chronic COPD   Patient does not seem to have acute COPD exacerbation   ID -continue IV abx as prescibed -follow up cultures  GI/Nutrition GI PROPHYLAXIS as indicated DIET-->TF's as tolerated Constipation protocol as indicated  ENDO - ICU hypoglycemic\Hyperglycemia protocol -check FSBS per protocol   ELECTROLYTES -follow labs as needed -replace as needed -pharmacy consultation   DVT/GI PRX ordered -SCDs  TRANSFUSIONS AS NEEDED MONITOR FSBS ASSESS the need for LABS as needed   Critical care provider statement:    Total critical care time: 33 minutes   Performed by: Lanney Gins MD   Critical care time was exclusive of separately billable procedures and treating other patients.   Critical care was necessary to treat or prevent imminent or life-threatening deterioration.   Critical care was time spent personally by me on the following activities: development of treatment plan with patient and/or surrogate as well as nursing, discussions with consultants, evaluation of patient's response to treatment, examination of patient, obtaining history from patient or surrogate, ordering and performing treatments and interventions, ordering and review of laboratory studies, ordering and review of radiographic studies, pulse oximetry and re-evaluation of patient's condition.    Ottie Glazier, M.D.  Pulmonary & Critical Care Medicine

## 2021-09-16 NOTE — ED Notes (Signed)
Pt continuing to pull at line and monitoring equipment. Sitter remains at bedside.

## 2021-09-16 NOTE — ED Notes (Signed)
Legrand Como RN aware of assigned bed

## 2021-09-16 NOTE — Progress Notes (Addendum)
PROGRESS NOTE  Denise [name]    DOB: [birth year], 87 y.o.  SWN:[id number]  PCP: Rica Koyanagi, MD   Code Status: DNR   DOA: 09/14/2021   LOS: 1  Brief Narrative of Current Hospitalization  Denise [name] is a 87 y.o. female with a PMH significant for A. fib, CKD, COPD, type II DM, GERD, HTN, hypothyroidism. They presented from ALF to the ED on 09/14/2021 with altered mental status acutely.  She received chest compressions at her ALF which resulted in multiple fractured ribs.  EMS arrived and evaluated that she had a glucose level of 30 and provided her with an amp of D50.  In the ED, it was found that they had a glucose of 78 and minimally responsive. They were treated with IV fluids and glucose.  Patient was admitted to medicine service for further workup and management of hypoglycemia and AMS as outlined in detail below.  09/16/21 -stable  Assessment & Plan  Principal Problem:   Hypoglycemia Active Problems:   Sepsis ([place])  Hypoglycemic event- resolved Type II DM - continue sSSI and frequent CBGs  Acute metabolic encephalitis-  Vascular shock- No acute findings on head imaging, except for frontal scalp hematoma. No source on urine studies or abdominal imaging unresolved but improved with correction of glucose and improvement in bicarb gtt. Has been off levophed since about 0800 and maintaining Bps. BxCx NGTD. - CCM following, appreciate recommendations - palliative care consult for [place] discussion as well as confirm code status - bicarb gtt until normalized >26 - NPO - PRN analgesia from multiple rib fractures as well as scheduled tylenol since patient is unable to verbalize requests. - f/u cultures  AKI on CKD- improving Cr 2.83>2.49 - BMP am - IV fluid hydration while NPO - strict I/O  Afib- rate controlled  - repeat echo  Hypothyroidism- TSH normal on admission - continue levothyroxine  Dementia  Anxiety  delirium- patient responded with "hi" when I greeted her  this morning but no further meaningful responses.  - continue ativan PRN for anxiety  COPD- chronic, stable  GERD  DVT prophylaxis: SCDs   Diet:  Diet Orders (From admission, onward)     Start     Ordered   09/16/21 0617  Diet NPO time specified Except for: Sips with Meds  Diet effective now       Question:  Except for  Answer:  Sips with Meds   09/16/21 0616            Subjective 09/16/21    Pt unable to report ROS.  Disposition Plan & Communication  Patient status: Inpatient  Admitted From:  ILF Disposition: Nursing home Anticipated discharge date: TBD  Family Communication: none  Consults, Procedures, Significant Events  Consultants:  CCM  Procedures/significant events:  none Antimicrobials:  Anti-infectives (From admission, onward)    Start     Dose/Rate Route Frequency Ordered Stop   09/14/21 2330  ceFEPIme (MAXIPIME) 2 g in sodium chloride 0.9 % 100 mL IVPB        2 g 200 mL/hr over 30 Minutes Intravenous  Once 09/14/21 2329 09/15/21 0027   09/14/21 2330  vancomycin (VANCOCIN) IVPB 1000 mg/200 mL premix        1,000 mg 200 mL/hr over 60 Minutes Intravenous  Once 09/14/21 2329 09/15/21 0330       Objective   Vitals:   09/16/21 0315 09/16/21 0330 09/16/21 0445 09/16/21 0515  BP: 119/61 (!) 153/76 (!) 155/74 (!) 158/68  Pulse: 88 86 92 92  Resp:  20  20  Temp:      TempSrc:      SpO2:  97%    Weight:      Height:       No intake or output data in the 24 hours ending 09/16/21 0729 Filed Weights   09/14/21 2045  Weight: 70 kg    Patient BMI: Body mass index is 25.68 kg/m.   Physical Exam: General: awake, alert, acutely anxious and moaning HEENT: atraumatic, clear conjunctiva, anicteric sclera, moist mucus membranes Respiratory: normal respiratory effort. Clear lung fields Cardiovascular: normal S1/S2,  RRR, no JVD, quick capillary refill  Gastrointestinal: soft, NT, ND, no HSM felt Nervous: alert. Moving all extremities. Unable to follow  commands Extremities: moves all equally, no edema, normal tone. Right forearm excessive bruising. Left forearm erythematous.  Skin: ulceration on right upper chest Psychiatry: anxious mood  Labs   I have personally reviewed following labs and imaging studies Admission on 09/14/2021  Component Date Value Ref Range Status   Glucose-Capillary 09/14/2021 194 (H)  70 - 99 mg/dL Final   WBC 09/14/2021 6.1  4.0 - 10.5 K/uL Final   RBC 09/14/2021 3.12 (L)  3.87 - 5.11 MIL/uL Final   Hemoglobin 09/14/2021 9.2 (L)  12.0 - 15.0 g/dL Final   HCT 09/14/2021 29.2 (L)  36.0 - 46.0 % Final   MCV 09/14/2021 93.6  80.0 - 100.0 fL Final   MCH 09/14/2021 29.5  26.0 - 34.0 pg Final   MCHC 09/14/2021 31.5  30.0 - 36.0 g/dL Final   RDW 09/14/2021 19.0 (H)  11.5 - 15.5 % Final   Platelets 09/14/2021 201  150 - 400 K/uL Final   nRBC 09/14/2021 0.0  0.0 - 0.2 % Final   Neutrophils Relative % 09/14/2021 83  % Final   Neutro Abs 09/14/2021 5.0  1.7 - 7.7 K/uL Final   Lymphocytes Relative 09/14/2021 9  % Final   Lymphs Abs 09/14/2021 0.6 (L)  0.7 - 4.0 K/uL Final   Monocytes Relative 09/14/2021 6  % Final   Monocytes Absolute 09/14/2021 0.4  0.1 - 1.0 K/uL Final   Eosinophils Relative 09/14/2021 0  % Final   Eosinophils Absolute 09/14/2021 0.0  0.0 - 0.5 K/uL Final   Basophils Relative 09/14/2021 1  % Final   Basophils Absolute 09/14/2021 0.0  0.0 - 0.1 K/uL Final   Immature Granulocytes 09/14/2021 1  % Final   Abs Immature Granulocytes 09/14/2021 0.04  0.00 - 0.07 K/uL Final   Sodium 09/14/2021 144  135 - 145 mmol/L Final   Potassium 09/14/2021 4.0  3.5 - 5.1 mmol/L Final   Chloride 09/14/2021 117 (H)  98 - 111 mmol/L Final   CO2 09/14/2021 20 (L)  22 - 32 mmol/L Final   Glucose, Bld 09/14/2021 41 (LL)  70 - 99 mg/dL Final   BUN 09/14/2021 58 (H)  8 - 23 mg/dL Final   Creatinine, Ser 09/14/2021 3.86 (H)  0.44 - 1.00 mg/dL Final   Calcium 09/14/2021 8.4 (L)  8.9 - 10.3 mg/dL Final   GFR, Estimated  09/14/2021 11 (L)  >60 mL/min Final   Anion gap 09/14/2021 7  5 - 15 Final   Troponin I (High Sensitivity) 09/14/2021 34 (H)  <18 ng/L Final   Lactic Acid, Venous 09/14/2021 0.7  0.5 - 1.9 mmol/L Final   Lactic Acid, Venous 09/15/2021 1.5  0.5 - 1.9 mmol/L Final   Specimen Description 09/14/2021 RIGHT ANTECUBITAL  Final   Special Requests 09/14/2021 BOTTLES DRAWN AEROBIC AND ANAEROBIC Blood Culture adequate volume   Final   Culture 09/14/2021    Final                   Value:NO GROWTH 2 DAYS Performed at [facility], [address] [place], [place] [address]    Report Status 09/14/2021 PENDING   Incomplete   Specimen Description 09/14/2021 BLOOD LEFT HAND   Final   Special Requests 09/14/2021 BOTTLES DRAWN AEROBIC AND ANAEROBIC Blood Culture adequate volume   Final   Culture 09/14/2021    Final                   Value:NO GROWTH 2 DAYS Performed at [facility], [address] [place],  [address]    Report Status 09/14/2021 PENDING   Incomplete   Color, Urine 09/15/2021 YELLOW (A)  YELLOW Final   APPearance 09/15/2021 CLEAR (A)  CLEAR Final   Specific Gravity, Urine 09/15/2021 1.019  1.005 - 1.030 Final   pH 09/15/2021 5.0  5.0 - 8.0 Final   Glucose, UA 09/15/2021 NEGATIVE  NEGATIVE mg/dL Final   Hgb urine dipstick 09/15/2021 NEGATIVE  NEGATIVE Final   Bilirubin Urine 09/15/2021 NEGATIVE  NEGATIVE Final   Ketones, ur 09/15/2021 NEGATIVE  NEGATIVE mg/dL Final   Protein, ur 09/15/2021 NEGATIVE  NEGATIVE mg/dL Final   Nitrite 09/15/2021 NEGATIVE  NEGATIVE Final   Leukocytes,Ua 09/15/2021 NEGATIVE  NEGATIVE Final   RBC / HPF 09/15/2021 0-5  0 - 5 RBC/hpf Final   WBC, UA 09/15/2021 0-5  0 - 5 WBC/hpf Final   Bacteria, UA 09/15/2021 RARE (A)  NONE SEEN Final   Squamous Epithelial / LPF 09/15/2021 0-5  0 - 5 Final   Mucus 09/15/2021 PRESENT   Final   SARS Coronavirus 2 by RT PCR 09/14/2021 NEGATIVE  NEGATIVE Final   Influenza A by PCR 09/14/2021 NEGATIVE   NEGATIVE Final   Influenza B by PCR 09/14/2021 NEGATIVE  NEGATIVE Final   Glucose-Capillary 09/14/2021 31 (LL)  70 - 99 mg/dL Final   Comment 1 09/14/2021 Repeat Test   Final   Glucose-Capillary 09/14/2021 74  70 - 99 mg/dL Final   Troponin I (High Sensitivity) 09/14/2021 32 (H)  <18 ng/L Final   Glucose-Capillary 09/14/2021 152 (H)  70 - 99 mg/dL Final   Sodium 09/15/2021 139  135 - 145 mmol/L Final   Potassium 09/15/2021 4.2  3.5 - 5.1 mmol/L Final   Chloride 09/15/2021 114 (H)  98 - 111 mmol/L Final   CO2 09/15/2021 18 (L)  22 - 32 mmol/L Final   Glucose, Bld 09/15/2021 287 (H)  70 - 99 mg/dL Final   BUN 09/15/2021 51 (H)  8 - 23 mg/dL Final   Creatinine, Ser 09/15/2021 3.25 (H)  0.44 - 1.00 mg/dL Final   Calcium 09/15/2021 7.9 (L)  8.9 - 10.3 mg/dL Final   GFR, Estimated 09/15/2021 13 (L)  >60 mL/min Final   Anion gap 09/15/2021 7  5 - 15 Final   WBC 09/15/2021 6.4  4.0 - 10.5 K/uL Final   RBC 09/15/2021 2.95 (L)  3.87 - 5.11 MIL/uL Final   Hemoglobin 09/15/2021 8.3 (L)  12.0 - 15.0 g/dL Final   HCT 09/15/2021 27.5 (L)  36.0 - 46.0 % Final   MCV 09/15/2021 93.2  80.0 - 100.0 fL Final   MCH 09/15/2021 28.1  26.0 - 34.0 pg Final   MCHC 09/15/2021 30.2  30.0 - 36.0 g/dL Final   RDW 09/15/2021 19.0 (H)  11.5 - 15.5 % Final   Platelets 09/15/2021 195  150 - 400 K/uL Final   nRBC 09/15/2021 0.0  0.0 - 0.2 % Final   Glucose-Capillary 09/15/2021 205 (H)  70 - 99 mg/dL Final   Glucose-Capillary 09/15/2021 137 (H)  70 - 99 mg/dL Final   Glucose-Capillary 09/15/2021 133 (H)  70 - 99 mg/dL Final   Glucose-Capillary 09/15/2021 93  70 - 99 mg/dL Final   Comment 1 09/15/2021 Notify RN   Final   Sodium 09/15/2021 142  135 - 145 mmol/L Final   Potassium 09/15/2021 4.0  3.5 - 5.1 mmol/L Final   Chloride 09/15/2021 117 (H)  98 - 111 mmol/L Final   CO2 09/15/2021 18 (L)  22 - 32 mmol/L Final   Glucose, Bld 09/15/2021 204 (H)  70 - 99 mg/dL Final   BUN 09/15/2021 46 (H)  8 - 23 mg/dL Final    Creatinine, Ser 09/15/2021 2.83 (H)  0.44 - 1.00 mg/dL Final   Calcium 09/15/2021 7.5 (L)  8.9 - 10.3 mg/dL Final   GFR, Estimated 09/15/2021 15 (L)  >60 mL/min Final   Anion gap 09/15/2021 7  5 - 15 Final   TSH 09/15/2021 1.486  0.350 - 4.500 uIU/mL Final   Glucose-Capillary 09/15/2021 195 (H)  70 - 99 mg/dL Final   Procalcitonin 09/15/2021 0.12  ng/mL Final   CRP 09/15/2021 2.6 (H)  <1.0 mg/dL Final   Adenovirus 09/15/2021 NOT DETECTED  NOT DETECTED Final   Coronavirus 229E 09/15/2021 NOT DETECTED  NOT DETECTED Final   Coronavirus HKU1 09/15/2021 NOT DETECTED  NOT DETECTED Final   Coronavirus NL63 09/15/2021 NOT DETECTED  NOT DETECTED Final   Coronavirus OC43 09/15/2021 NOT DETECTED  NOT DETECTED Final   Metapneumovirus 09/15/2021 NOT DETECTED  NOT DETECTED Final   Rhinovirus / Enterovirus 09/15/2021 NOT DETECTED  NOT DETECTED Final   Influenza A 09/15/2021 NOT DETECTED  NOT DETECTED Final   Influenza B 09/15/2021 NOT DETECTED  NOT DETECTED Final   Parainfluenza Virus 1 09/15/2021 NOT DETECTED  NOT DETECTED Final   Parainfluenza Virus 2 09/15/2021 NOT DETECTED  NOT DETECTED Final   Parainfluenza Virus 3 09/15/2021 NOT DETECTED  NOT DETECTED Final   Parainfluenza Virus 4 09/15/2021 NOT DETECTED  NOT DETECTED Final   Respiratory Syncytial Virus 09/15/2021 NOT DETECTED  NOT DETECTED Final   Bordetella pertussis 09/15/2021 NOT DETECTED  NOT DETECTED Final   Bordetella Parapertussis 09/15/2021 NOT DETECTED  NOT DETECTED Final   Chlamydophila pneumoniae 09/15/2021 NOT DETECTED  NOT DETECTED Final   Mycoplasma pneumoniae 09/15/2021 NOT DETECTED  NOT DETECTED Final   Glucose-Capillary 09/15/2021 132 (H)  70 - 99 mg/dL Final   Glucose-Capillary 09/15/2021 98  70 - 99 mg/dL Final    Imaging Studies  No results found. Medications   Scheduled Meds:  acetaminophen  1,000 mg Oral Once   collagenase  1 application Topical Daily   fluticasone  2 spray Each Nare Daily   insulin aspart  0-9  Units Subcutaneous TID AC & HS   levothyroxine  100 mcg Oral Q0600   lidocaine  1 patch Transdermal Q24H   mupirocin cream  1 application Topical Daily   pantoprazole  20 mg Oral Daily   QUEtiapine  25 mg Oral QHS   tiotropium  18 mcg Inhalation Daily   No recently discontinued medications to reconcile  LOS: 1 day   Time spent: >4min  Alona Danford  Geanie Berlin, DO [facility] 09/16/2021, 7:29 AM   Please refer to amion to contact the [facility] Attending or Consulting provider for this pt  [email or web address] Available by Epic secure chat 7AM-7PM. If 7PM-7AM, please contact night-coverage

## 2021-09-16 NOTE — ED Notes (Signed)
Pt screaming and attempting to removed all monitoring devices. Sitter at bedside with patient.

## 2021-09-16 NOTE — Progress Notes (Signed)
Notified hospitalist of morning med. Pt now NPO w/sips with meds. Administered thyroid med crushed in small amount of water. Also administered IV benadryl per PRN order.

## 2021-09-16 NOTE — ED Notes (Signed)
Discard 1000 mLs of pt urine.

## 2021-09-16 NOTE — ED Notes (Signed)
Pt continuing to pull at line and monitoring devices. Sitter remains at bedside with patient.

## 2021-09-16 NOTE — ED Notes (Signed)
Pt removed both mittens

## 2021-09-16 NOTE — Progress Notes (Signed)
Cross Cover Patient admitted with hypoglycemia, metabolic acisodis, AKI .  Sodium bicarb infusion complete and bicarb noirmalized. Off pressors since 0800.  Started on D5LR for continued hydration. Mentation improving since admission but remains npo.  CBG every 4 hour monitoring Patient now appropriate for progressive care

## 2021-09-16 NOTE — ED Notes (Signed)
Sitter remains at bedside with patient at this time. Pt continues to pull at lines and monitoring devices.

## 2021-09-16 NOTE — ED Notes (Signed)
Pt pulled purewick out. Purewick was replaced.

## 2021-09-16 NOTE — ED Notes (Signed)
Pt request pain medication

## 2021-09-16 NOTE — ED Notes (Signed)
Pt removed right mitten

## 2021-09-16 NOTE — ED Notes (Signed)
Pt continues to be restless and moan. Pt also continues to attempt to remove monitoring devices.

## 2021-09-17 ENCOUNTER — Inpatient Hospital Stay: Payer: Medicare Other

## 2021-09-17 ENCOUNTER — Encounter: Payer: Self-pay | Admitting: Family Medicine

## 2021-09-17 DIAGNOSIS — A419 Sepsis, unspecified organism: Secondary | ICD-10-CM

## 2021-09-17 DIAGNOSIS — S2243XA Multiple fractures of ribs, bilateral, initial encounter for closed fracture: Secondary | ICD-10-CM

## 2021-09-17 DIAGNOSIS — F03918 Unspecified dementia, unspecified severity, with other behavioral disturbance: Secondary | ICD-10-CM

## 2021-09-17 DIAGNOSIS — R4182 Altered mental status, unspecified: Secondary | ICD-10-CM

## 2021-09-17 DIAGNOSIS — G9341 Metabolic encephalopathy: Secondary | ICD-10-CM | POA: Insufficient documentation

## 2021-09-17 DIAGNOSIS — T68XXXA Hypothermia, initial encounter: Secondary | ICD-10-CM

## 2021-09-17 DIAGNOSIS — R6521 Severe sepsis with septic shock: Secondary | ICD-10-CM

## 2021-09-17 LAB — HEPATIC FUNCTION PANEL
ALT: 29 U/L (ref 0–44)
AST: 53 U/L — ABNORMAL HIGH (ref 15–41)
Albumin: 2.5 g/dL — ABNORMAL LOW (ref 3.5–5.0)
Alkaline Phosphatase: 254 U/L — ABNORMAL HIGH (ref 38–126)
Bilirubin, Direct: 0.2 mg/dL (ref 0.0–0.2)
Indirect Bilirubin: 0.5 mg/dL (ref 0.3–0.9)
Total Bilirubin: 0.7 mg/dL (ref 0.3–1.2)
Total Protein: 5.8 g/dL — ABNORMAL LOW (ref 6.5–8.1)

## 2021-09-17 LAB — BASIC METABOLIC PANEL
Anion gap: 7 (ref 5–15)
BUN: 38 mg/dL — ABNORMAL HIGH (ref 8–23)
CO2: 26 mmol/L (ref 22–32)
Calcium: 7.8 mg/dL — ABNORMAL LOW (ref 8.9–10.3)
Chloride: 111 mmol/L (ref 98–111)
Creatinine, Ser: 2.1 mg/dL — ABNORMAL HIGH (ref 0.44–1.00)
GFR, Estimated: 22 mL/min — ABNORMAL LOW (ref >60)
Glucose, Bld: 161 mg/dL — ABNORMAL HIGH (ref 70–99)
Potassium: 3.9 mmol/L (ref 3.5–5.1)
Sodium: 144 mmol/L (ref 135–145)

## 2021-09-17 LAB — CBC
HCT: 27.5 % — ABNORMAL LOW (ref 36.0–46.0)
Hemoglobin: 8.8 g/dL — ABNORMAL LOW (ref 12.0–15.0)
MCH: 28.8 pg (ref 26.0–34.0)
MCHC: 32 g/dL (ref 30.0–36.0)
MCV: 89.9 fL (ref 80.0–100.0)
Platelets: 178 10*3/uL (ref 150–400)
RBC: 3.06 MIL/uL — ABNORMAL LOW (ref 3.87–5.11)
RDW: 19.4 % — ABNORMAL HIGH (ref 11.5–15.5)
WBC: 6.5 10*3/uL (ref 4.0–10.5)
nRBC: 0 % (ref 0.0–0.2)

## 2021-09-17 LAB — AMMONIA: Ammonia: 16 umol/L (ref 9–35)

## 2021-09-17 LAB — CBG MONITORING, ED
Glucose-Capillary: 111 mg/dL — ABNORMAL HIGH (ref 70–99)
Glucose-Capillary: 147 mg/dL — ABNORMAL HIGH (ref 70–99)

## 2021-09-17 LAB — GLUCOSE, CAPILLARY
Glucose-Capillary: 179 mg/dL — ABNORMAL HIGH (ref 70–99)
Glucose-Capillary: 121 mg/dL — ABNORMAL HIGH (ref 70–99)
Glucose-Capillary: 131 mg/dL — ABNORMAL HIGH (ref 70–99)

## 2021-09-17 LAB — VITAMIN B12: Vitamin B-12: 1377 pg/mL — ABNORMAL HIGH (ref 180–914)

## 2021-09-17 MED ORDER — FENTANYL CITRATE PF 50 MCG/ML IJ SOSY
12.5000 ug | PREFILLED_SYRINGE | INTRAMUSCULAR | Status: DC | PRN
  Administered 2021-09-22: 25 ug via INTRAVENOUS
  Filled 2021-09-17: qty 1

## 2021-09-17 MED ORDER — HYDROMORPHONE HCL 1 MG/ML IJ SOLN
0.5000 mg | Freq: Four times a day (QID) | INTRAMUSCULAR | Status: AC
  Administered 2021-09-17 – 2021-09-18 (×6): 0.5 mg via INTRAVENOUS
  Filled 2021-09-17 (×6): qty 1

## 2021-09-17 MED ORDER — THIAMINE HCL 100 MG/ML IJ SOLN
500.0000 mg | Freq: Three times a day (TID) | INTRAVENOUS | Status: DC
  Administered 2021-09-17 – 2021-09-20 (×8): 500 mg via INTRAVENOUS
  Filled 2021-09-17 (×10): qty 5

## 2021-09-17 NOTE — ED Notes (Signed)
Lab at bedside

## 2021-09-17 NOTE — Progress Notes (Signed)
PROGRESS NOTE  Denise [name]    DOB: 10-10-1930, 87 y.o.  XTG:[id number]  PCP: Rica Koyanagi, MD   Code Status: DNR   DOA: 09/14/2021   LOS: 2  Brief Narrative of Current Hospitalization  Denise [name] is a 87 y.o. female with a PMH significant for A. fib, CKD, COPD, type II DM, GERD, HTN, hypothyroidism. They presented from ALF to the ED on 09/14/2021 with altered mental status acutely.  She received chest compressions at her ALF which resulted in multiple fractured ribs.  EMS arrived and evaluated that she had a glucose level of 30 and provided her with an amp of D50.  In the ED, it was found that they had a glucose of 78 and minimally responsive. They were treated with IV fluids and glucose.  Patient was admitted to medicine service for further workup and management of hypoglycemia and AMS as outlined in detail below.  09/17/21 -stable unable to draw labs this morning for unknown reason. Discussed with nursing staff who will request it be drawn.   Assessment & Plan  Principal Problem:   Hypoglycemia Active Problems:   Sepsis ([place])  Hypoglycemic event- resolved Type II DM - continue sSSI and frequent CBGs  Acute metabolic encephalitis-  Vascular shock- Criteria for shock have resolved. No acute findings on head imaging, except for frontal scalp hematoma. No source on urine studies or chest/abdominal imaging. Labs were not collected today. Has been off levophed since about 0800 11/24 and maintaining Bps. BxCx NGTD. Would have expected closer return to baseline given corrections but remains very disoriented.  - consulted neurology, appreciate recs - CCM following, appreciate recommendations - palliative care consult for [place] discussion as well as confirm code status - NPO - PRN analgesia from multiple rib fractures as well as scheduled tylenol/dilaudid since patient is unable to verbalize requests. - f/u cultures - discontinued benadryl- do not restart. - repeat head CT today,  unable to MRI with pacemaker.  AKI on CKD- improving Cr 2.83>2.49.  - BMP am - IV fluid hydration while NPO - strict I/O  Acute rib fractures, healing pelvic fracture-  - analgesia as above - lidocaine patch  Left forearm erythema- greatly improved overnight with removal of restrictive IV dressing. Continues to be mildly erythematous but not tender to touch.   Afib- rate controlled. No anticoagulation.  - repeat echo  Hypothyroidism- TSH normal on admission - continue levothyroxine  Dementia  Anxiety  delirium- peak resident. patient has baseline incontinence and disorientation but able to hold conversations with her daughter. Their last conversation was 10/31. Daughter states that her mom has had gradual decline over past several months/years. - continue ativan PRN for anxiety  COPD- chronic, stable  GERD-   DVT prophylaxis: SCDs.   Diet:  Diet Orders (From admission, onward)     Start     Ordered   09/16/21 0617  Diet NPO time specified Except for: Sips with Meds  Diet effective now       Question:  Except for  Answer:  [name] with Meds   09/16/21 0616            Subjective 09/17/21    Pt unable to interact or follow commands today.  Disposition Plan & Communication  Patient status: Inpatient  Admitted From:  ILF Disposition: Nursing home Anticipated discharge date: TBD  Family Communication: daughter on phone Consults, Procedures, Significant Events  Consultants:  CCM  Procedures/significant events:  none Antimicrobials:  Anti-infectives (From admission, onward)  Start     Dose/Rate Route Frequency Ordered Stop   09/14/21 2330  ceFEPIme (MAXIPIME) 2 g in sodium chloride 0.9 % 100 mL IVPB        2 g 200 mL/hr over 30 Minutes Intravenous  Once 09/14/21 2329 09/15/21 0027   09/14/21 2330  vancomycin (VANCOCIN) IVPB 1000 mg/200 mL premix        1,000 mg 200 mL/hr over 60 Minutes Intravenous  Once 09/14/21 2329 09/15/21 0330       Objective    Vitals:   09/17/21 0530 09/17/21 0600 09/17/21 0700 09/17/21 0920  BP: (!) 170/88 (!) 157/87 (!) 162/118 (!) 164/92  Pulse: (!) 110 (!) 111 (!) 111 (!) 113  Resp: (!) 21 (!) 21 (!) 30 (!) 27  Temp:      TempSrc:      SpO2: 93% 94% 91% 91%  Weight:      Height:        Intake/Output Summary (Last 24 hours) at 09/17/2021 1046 Last data filed at 09/16/2021 2241 Gross per 24 hour  Intake --  Output 500 ml  Net -500 ml    Filed Weights   09/14/21 2045  Weight: 70 kg    Patient BMI: Body mass index is 25.68 kg/m.   Physical Exam: General: awake, alert, acutely anxious and moaning HEENT: atraumatic, clear conjunctiva, anicteric sclera, moist mucus membranes Respiratory: normal respiratory effort. Clear lung fields Cardiovascular: normal S1/S2,  RRR, no JVD, quick capillary refill  Gastrointestinal: soft, NT, ND, no HSM felt Nervous: alert. Moving all extremities. Unable to follow commands. Does not appear to respond to conversation.  Extremities: moves all equally, no edema, normal tone. Right forearm excessive bruising. Left forearm erythematous.  Skin: ulceration on right upper chest Psychiatry: anxious mood  Labs   I have personally reviewed following labs and imaging studies Admission on 09/14/2021  Component Date Value Ref Range Status   Glucose-Capillary 09/14/2021 194 (H)  70 - 99 mg/dL Final   WBC 09/14/2021 6.1  4.0 - 10.5 K/uL Final   RBC 09/14/2021 3.12 (L)  3.87 - 5.11 MIL/uL Final   Hemoglobin 09/14/2021 9.2 (L)  12.0 - 15.0 g/dL Final   HCT 09/14/2021 29.2 (L)  36.0 - 46.0 % Final   MCV 09/14/2021 93.6  80.0 - 100.0 fL Final   MCH 09/14/2021 29.5  26.0 - 34.0 pg Final   MCHC 09/14/2021 31.5  30.0 - 36.0 g/dL Final   RDW 09/14/2021 19.0 (H)  11.5 - 15.5 % Final   Platelets 09/14/2021 201  150 - 400 K/uL Final   nRBC 09/14/2021 0.0  0.0 - 0.2 % Final   Neutrophils Relative % 09/14/2021 83  % Final   Neutro Abs 09/14/2021 5.0  1.7 - 7.7 K/uL Final    Lymphocytes Relative 09/14/2021 9  % Final   Lymphs Abs 09/14/2021 0.6 (L)  0.7 - 4.0 K/uL Final   Monocytes Relative 09/14/2021 6  % Final   Monocytes Absolute 09/14/2021 0.4  0.1 - 1.0 K/uL Final   Eosinophils Relative 09/14/2021 0  % Final   Eosinophils Absolute 09/14/2021 0.0  0.0 - 0.5 K/uL Final   Basophils Relative 09/14/2021 1  % Final   Basophils Absolute 09/14/2021 0.0  0.0 - 0.1 K/uL Final   Immature Granulocytes 09/14/2021 1  % Final   Abs Immature Granulocytes 09/14/2021 0.04  0.00 - 0.07 K/uL Final   Sodium 09/14/2021 144  135 - 145 mmol/L Final   Potassium 09/14/2021 4.0  3.5 - 5.1 mmol/L Final   Chloride 09/14/2021 117 (H)  98 - 111 mmol/L Final   CO2 09/14/2021 20 (L)  22 - 32 mmol/L Final   Glucose, Bld 09/14/2021 41 (LL)  70 - 99 mg/dL Final   BUN 09/14/2021 58 (H)  8 - 23 mg/dL Final   Creatinine, Ser 09/14/2021 3.86 (H)  0.44 - 1.00 mg/dL Final   Calcium 09/14/2021 8.4 (L)  8.9 - 10.3 mg/dL Final   GFR, Estimated 09/14/2021 11 (L)  >60 mL/min Final   Anion gap 09/14/2021 7  5 - 15 Final   Troponin I (High Sensitivity) 09/14/2021 34 (H)  <18 ng/L Final   Lactic Acid, Venous 09/14/2021 0.7  0.5 - 1.9 mmol/L Final   Lactic Acid, Venous 09/15/2021 1.5  0.5 - 1.9 mmol/L Final   Specimen Description 09/14/2021 RIGHT ANTECUBITAL   Final   Special Requests 09/14/2021 BOTTLES DRAWN AEROBIC AND ANAEROBIC Blood Culture adequate volume   Final   Culture 09/14/2021    Final                   Value:NO GROWTH 3 DAYS Performed at [facility], [address] [place], [place] [address]    Report Status 09/14/2021 PENDING   Incomplete   Specimen Description 09/14/2021 BLOOD LEFT HAND   Final   Special Requests 09/14/2021 BOTTLES DRAWN AEROBIC AND ANAEROBIC Blood Culture adequate volume   Final   Culture 09/14/2021    Final                   Value:NO GROWTH 3 DAYS Performed at [facility], [address] [place], [place] [address]    Report Status  09/14/2021 PENDING   Incomplete   Color, Urine 09/15/2021 YELLOW (A)  YELLOW Final   APPearance 09/15/2021 CLEAR (A)  CLEAR Final   Specific Gravity, Urine 09/15/2021 1.019  1.005 - 1.030 Final   pH 09/15/2021 5.0  5.0 - 8.0 Final   Glucose, UA 09/15/2021 NEGATIVE  NEGATIVE mg/dL Final   Hgb urine dipstick 09/15/2021 NEGATIVE  NEGATIVE Final   Bilirubin Urine 09/15/2021 NEGATIVE  NEGATIVE Final   Ketones, ur 09/15/2021 NEGATIVE  NEGATIVE mg/dL Final   Protein, ur 09/15/2021 NEGATIVE  NEGATIVE mg/dL Final   Nitrite 09/15/2021 NEGATIVE  NEGATIVE Final   Leukocytes,Ua 09/15/2021 NEGATIVE  NEGATIVE Final   RBC / HPF 09/15/2021 0-5  0 - 5 RBC/hpf Final   WBC, UA 09/15/2021 0-5  0 - 5 WBC/hpf Final   Bacteria, UA 09/15/2021 RARE (A)  NONE SEEN Final   Squamous Epithelial / LPF 09/15/2021 0-5  0 - 5 Final   Mucus 09/15/2021 PRESENT   Final   SARS Coronavirus 2 by RT PCR 09/14/2021 NEGATIVE  NEGATIVE Final   Influenza A by PCR 09/14/2021 NEGATIVE  NEGATIVE Final   Influenza B by PCR 09/14/2021 NEGATIVE  NEGATIVE Final   Glucose-Capillary 09/14/2021 31 (LL)  70 - 99 mg/dL Final   Comment 1 09/14/2021 Repeat Test   Final   Glucose-Capillary 09/14/2021 74  70 - 99 mg/dL Final   Troponin I (High Sensitivity) 09/14/2021 32 (H)  <18 ng/L Final   Glucose-Capillary 09/14/2021 152 (H)  70 - 99 mg/dL Final   Sodium 09/15/2021 139  135 - 145 mmol/L Final   Potassium 09/15/2021 4.2  3.5 - 5.1 mmol/L Final   Chloride 09/15/2021 114 (H)  98 - 111 mmol/L Final   CO2 09/15/2021 18 (L)  22 - 32  mmol/L Final   Glucose, Bld 09/15/2021 287 (H)  70 - 99 mg/dL Final   BUN 09/15/2021 51 (H)  8 - 23 mg/dL Final   Creatinine, Ser 09/15/2021 3.25 (H)  0.44 - 1.00 mg/dL Final   Calcium 09/15/2021 7.9 (L)  8.9 - 10.3 mg/dL Final   GFR, Estimated 09/15/2021 13 (L)  >60 mL/min Final   Anion gap 09/15/2021 7  5 - 15 Final   WBC 09/15/2021 6.4  4.0 - 10.5 K/uL Final   RBC 09/15/2021 2.95 (L)  3.87 - 5.11 MIL/uL Final    Hemoglobin 09/15/2021 8.3 (L)  12.0 - 15.0 g/dL Final   HCT 09/15/2021 27.5 (L)  36.0 - 46.0 % Final   MCV 09/15/2021 93.2  80.0 - 100.0 fL Final   MCH 09/15/2021 28.1  26.0 - 34.0 pg Final   MCHC 09/15/2021 30.2  30.0 - 36.0 g/dL Final   RDW 09/15/2021 19.0 (H)  11.5 - 15.5 % Final   Platelets 09/15/2021 195  150 - 400 K/uL Final   nRBC 09/15/2021 0.0  0.0 - 0.2 % Final   Glucose-Capillary 09/15/2021 205 (H)  70 - 99 mg/dL Final   Glucose-Capillary 09/15/2021 137 (H)  70 - 99 mg/dL Final   Glucose-Capillary 09/15/2021 133 (H)  70 - 99 mg/dL Final   Glucose-Capillary 09/15/2021 93  70 - 99 mg/dL Final   Comment 1 09/15/2021 Notify RN   Final   Sodium 09/15/2021 142  135 - 145 mmol/L Final   Potassium 09/15/2021 4.0  3.5 - 5.1 mmol/L Final   Chloride 09/15/2021 117 (H)  98 - 111 mmol/L Final   CO2 09/15/2021 18 (L)  22 - 32 mmol/L Final   Glucose, Bld 09/15/2021 204 (H)  70 - 99 mg/dL Final   BUN 09/15/2021 46 (H)  8 - 23 mg/dL Final   Creatinine, Ser 09/15/2021 2.83 (H)  0.44 - 1.00 mg/dL Final   Calcium 09/15/2021 7.5 (L)  8.9 - 10.3 mg/dL Final   GFR, Estimated 09/15/2021 15 (L)  >60 mL/min Final   Anion gap 09/15/2021 7  5 - 15 Final   TSH 09/15/2021 1.486  0.350 - 4.500 uIU/mL Final   Sodium 09/16/2021 146 (H)  135 - 145 mmol/L Final   Potassium 09/16/2021 3.9  3.5 - 5.1 mmol/L Final   Chloride 09/16/2021 115 (H)  98 - 111 mmol/L Final   CO2 09/16/2021 24  22 - 32 mmol/L Final   Glucose, Bld 09/16/2021 94  70 - 99 mg/dL Final   BUN 09/16/2021 42 (H)  8 - 23 mg/dL Final   Creatinine, Ser 09/16/2021 2.49 (H)  0.44 - 1.00 mg/dL Final   Calcium 09/16/2021 7.8 (L)  8.9 - 10.3 mg/dL Final   GFR, Estimated 09/16/2021 18 (L)  >60 mL/min Final   Anion gap 09/16/2021 7  5 - 15 Final   Glucose-Capillary 09/15/2021 195 (H)  70 - 99 mg/dL Final   Procalcitonin 09/15/2021 0.12  ng/mL Final   Procalcitonin 09/16/2021 0.18  ng/mL Final   CRP 09/15/2021 2.6 (H)  <1.0 mg/dL Final   Adenovirus  09/15/2021 NOT DETECTED  NOT DETECTED Final   Coronavirus 229E 09/15/2021 NOT DETECTED  NOT DETECTED Final   Coronavirus HKU1 09/15/2021 NOT DETECTED  NOT DETECTED Final   Coronavirus NL63 09/15/2021 NOT DETECTED  NOT DETECTED Final   Coronavirus OC43 09/15/2021 NOT DETECTED  NOT DETECTED Final   Metapneumovirus 09/15/2021 NOT DETECTED  NOT DETECTED Final   Rhinovirus / Enterovirus  09/15/2021 NOT DETECTED  NOT DETECTED Final   Influenza A 09/15/2021 NOT DETECTED  NOT DETECTED Final   Influenza B 09/15/2021 NOT DETECTED  NOT DETECTED Final   Parainfluenza Virus 1 09/15/2021 NOT DETECTED  NOT DETECTED Final   Parainfluenza Virus 2 09/15/2021 NOT DETECTED  NOT DETECTED Final   Parainfluenza Virus 3 09/15/2021 NOT DETECTED  NOT DETECTED Final   Parainfluenza Virus 4 09/15/2021 NOT DETECTED  NOT DETECTED Final   Respiratory Syncytial Virus 09/15/2021 NOT DETECTED  NOT DETECTED Final   Bordetella pertussis 09/15/2021 NOT DETECTED  NOT DETECTED Final   Bordetella Parapertussis 09/15/2021 NOT DETECTED  NOT DETECTED Final   Chlamydophila pneumoniae 09/15/2021 NOT DETECTED  NOT DETECTED Final   Mycoplasma pneumoniae 09/15/2021 NOT DETECTED  NOT DETECTED Final   Glucose-Capillary 09/15/2021 132 (H)  70 - 99 mg/dL Final   Glucose-Capillary 09/15/2021 98  70 - 99 mg/dL Final   Glucose-Capillary 09/16/2021 93  70 - 99 mg/dL Final   Glucose-Capillary 09/16/2021 86  70 - 99 mg/dL Final   Glucose-Capillary 09/16/2021 92  70 - 99 mg/dL Final   Glucose-Capillary 09/16/2021 100 (H)  70 - 99 mg/dL Final   Glucose-Capillary 09/17/2021 111 (H)  70 - 99 mg/dL Final   Glucose-Capillary 09/17/2021 147 (H)  70 - 99 mg/dL Final    Imaging Studies  No results found. Medications   Scheduled Meds:  acetaminophen  650 mg Oral Q8H   Or   acetaminophen  650 mg Rectal Q8H   acetaminophen  1,000 mg Oral Once   collagenase  1 application Topical Daily   fluticasone  2 spray Each Nare Daily    HYDROmorphone  (DILAUDID) injection  0.5 mg Intravenous Q6H   insulin aspart  0-9 Units Subcutaneous Q4H   levothyroxine  100 mcg Oral Q0600   lidocaine  1 patch Transdermal Q24H   mupirocin cream  1 application Topical Daily   pantoprazole  20 mg Oral Daily   QUEtiapine  25 mg Oral QHS   tiotropium  18 mcg Inhalation Daily   No recently discontinued medications to reconcile  LOS: 2 days   Time spent: >79min  Marleigh Kaylor L Katora Fini, DO [facility] 09/17/2021, 10:46 AM   Please refer to amion to contact the [facility] Attending or Consulting provider for this pt  [email or web address] Available by Epic secure chat 7AM-7PM. If 7PM-7AM, please contact night-coverage

## 2021-09-17 NOTE — Progress Notes (Signed)
Eeg done 

## 2021-09-17 NOTE — ED Notes (Signed)
Report received from [place], [place]

## 2021-09-17 NOTE — Progress Notes (Incomplete)
PROGRESS NOTE  Denise [name]    DOB: August 26, 1930, 87 y.o.  SNK:[id number]  PCP: Rica Koyanagi, MD   Code Status: DNR   DOA: 09/14/2021   LOS: 2  Brief Narrative of Current Hospitalization  Denise [name] is a 87 y.o. female with a PMH significant for A. fib, CKD, COPD, type II DM, GERD, HTN, hypothyroidism. They presented from ALF to the ED on 09/14/2021 with altered mental status acutely.  She received chest compressions at her ALF which resulted in multiple fractured ribs.  EMS arrived and evaluated that she had a glucose level of 30 and provided her with an amp of D50.  In the ED, it was found that they had a glucose of 78 and minimally responsive. They were treated with IV fluids and glucose.  Patient was admitted to medicine service for further workup and management of hypoglycemia and AMS as outlined in detail below.  09/17/21 -stable  Assessment & Plan  Principal Problem:   Hypoglycemia Active Problems:   Sepsis ([place])  Hypoglycemic event- resolved Type II DM - continue sSSI and frequent CBGs  Acute metabolic encephalitis-  Vascular shock- No acute findings on head imaging, except for frontal scalp hematoma. No source on urine studies or abdominal imaging unresolved but improved with correction of glucose and improvement in bicarb gtt. Has been off levophed since about 0800 and maintaining Bps. BxCx NGTD. - CCM following, appreciate recommendations - palliative care consult for [place] discussion as well as confirm code status - bicarb gtt until normalized >26 - NPO - PRN analgesia from multiple rib fractures as well as scheduled tylenol since patient is unable to verbalize requests. - f/u cultures  AKI on CKD- improving Cr 2.83>2.49 - BMP am - IV fluid hydration while NPO - strict I/O  Afib- rate controlled  - repeat echo  Hypothyroidism- TSH normal on admission - continue levothyroxine  Dementia   Anxiety   delirium- peak resident. patient has baseline  incontinence and disorientation.  - continue ativan PRN for anxiety  COPD- chronic, stable  GERD-   DVT prophylaxis: SCDs   Diet:  Diet Orders (From admission, onward)     Start     Ordered   09/16/21 0617  Diet NPO time specified Except for: Sips with Meds  Diet effective now       Question:  Except for  Answer:  Sips with Meds   09/16/21 0616            Subjective 09/17/21    Pt***  Disposition Plan & Communication  Patient status: Inpatient  Admitted From:  ILF Disposition: Nursing home Anticipated discharge date: TBD  Family Communication: daughter on phone Consults, Procedures, Significant Events  Consultants:  CCM  Procedures/significant events:  none Antimicrobials:  Anti-infectives (From admission, onward)    Start     Dose/Rate Route Frequency Ordered Stop   09/14/21 2330  ceFEPIme (MAXIPIME) 2 g in sodium chloride 0.9 % 100 mL IVPB        2 g 200 mL/hr over 30 Minutes Intravenous  Once 09/14/21 2329 09/15/21 0027   09/14/21 2330  vancomycin (VANCOCIN) IVPB 1000 mg/200 mL premix        1,000 mg 200 mL/hr over 60 Minutes Intravenous  Once 09/14/21 2329 09/15/21 0330       Objective   Vitals:   09/17/21 0415 09/17/21 0500 09/17/21 0530 09/17/21 0600  BP: (!) 173/80 (!) 171/85 (!) 170/88 (!) 157/87  Pulse: (!) 111 (!) 111 Marland Kitchen)  110 (!) 111  Resp: (!) 26 (!) 23 (!) 21 (!) 21  Temp:      TempSrc:      SpO2: 90% 92% 93% 94%  Weight:      Height:        Intake/Output Summary (Last 24 hours) at 09/17/2021 0725 Last data filed at 09/16/2021 2241 Gross per 24 hour  Intake --  Output 500 ml  Net -500 ml   Filed Weights   09/14/21 2045  Weight: 70 kg    Patient BMI: Body mass index is 25.68 kg/m.   Physical Exam: General: awake, alert, acutely anxious and moaning HEENT: atraumatic, clear conjunctiva, anicteric sclera, moist mucus membranes Respiratory: normal respiratory effort. Clear lung fields Cardiovascular: normal S1/S2,  RRR, no  JVD, quick capillary refill  Gastrointestinal: soft, NT, ND, no HSM felt Nervous: alert. Moving all extremities. Unable to follow commands Extremities: moves all equally, no edema, normal tone. Right forearm excessive bruising. Left forearm erythematous.  Skin: ulceration on right upper chest Psychiatry: anxious mood  Labs   I have personally reviewed following labs and imaging studies Admission on 09/14/2021  Component Date Value Ref Range Status   Glucose-Capillary 09/14/2021 194 (H)  70 - 99 mg/dL Final   WBC 09/14/2021 6.1  4.0 - 10.5 K/uL Final   RBC 09/14/2021 3.12 (L)  3.87 - 5.11 MIL/uL Final   Hemoglobin 09/14/2021 9.2 (L)  12.0 - 15.0 g/dL Final   HCT 09/14/2021 29.2 (L)  36.0 - 46.0 % Final   MCV 09/14/2021 93.6  80.0 - 100.0 fL Final   MCH 09/14/2021 29.5  26.0 - 34.0 pg Final   MCHC 09/14/2021 31.5  30.0 - 36.0 g/dL Final   RDW 09/14/2021 19.0 (H)  11.5 - 15.5 % Final   Platelets 09/14/2021 201  150 - 400 K/uL Final   nRBC 09/14/2021 0.0  0.0 - 0.2 % Final   Neutrophils Relative % 09/14/2021 83  % Final   Neutro Abs 09/14/2021 5.0  1.7 - 7.7 K/uL Final   Lymphocytes Relative 09/14/2021 9  % Final   Lymphs Abs 09/14/2021 0.6 (L)  0.7 - 4.0 K/uL Final   Monocytes Relative 09/14/2021 6  % Final   Monocytes Absolute 09/14/2021 0.4  0.1 - 1.0 K/uL Final   Eosinophils Relative 09/14/2021 0  % Final   Eosinophils Absolute 09/14/2021 0.0  0.0 - 0.5 K/uL Final   Basophils Relative 09/14/2021 1  % Final   Basophils Absolute 09/14/2021 0.0  0.0 - 0.1 K/uL Final   Immature Granulocytes 09/14/2021 1  % Final   Abs Immature Granulocytes 09/14/2021 0.04  0.00 - 0.07 K/uL Final   Sodium 09/14/2021 144  135 - 145 mmol/L Final   Potassium 09/14/2021 4.0  3.5 - 5.1 mmol/L Final   Chloride 09/14/2021 117 (H)  98 - 111 mmol/L Final   CO2 09/14/2021 20 (L)  22 - 32 mmol/L Final   Glucose, Bld 09/14/2021 41 (LL)  70 - 99 mg/dL Final   BUN 09/14/2021 58 (H)  8 - 23 mg/dL Final    Creatinine, Ser 09/14/2021 3.86 (H)  0.44 - 1.00 mg/dL Final   Calcium 09/14/2021 8.4 (L)  8.9 - 10.3 mg/dL Final   GFR, Estimated 09/14/2021 11 (L)  >60 mL/min Final   Anion gap 09/14/2021 7  5 - 15 Final   Troponin I (High Sensitivity) 09/14/2021 34 (H)  <18 ng/L Final   Lactic Acid, Venous 09/14/2021 0.7  0.5 - 1.9 mmol/L  Final   Lactic Acid, Venous 09/15/2021 1.5  0.5 - 1.9 mmol/L Final   Specimen Description 09/14/2021 RIGHT ANTECUBITAL   Final   Special Requests 09/14/2021 BOTTLES DRAWN AEROBIC AND ANAEROBIC Blood Culture adequate volume   Final   Culture 09/14/2021    Final                   Value:NO GROWTH 3 DAYS Performed at [facility], [address] [place], [place] [address]    Report Status 09/14/2021 PENDING   Incomplete   Specimen Description 09/14/2021 BLOOD LEFT HAND   Final   Special Requests 09/14/2021 BOTTLES DRAWN AEROBIC AND ANAEROBIC Blood Culture adequate volume   Final   Culture 09/14/2021    Final                   Value:NO GROWTH 3 DAYS Performed at [facility], [address] [place], [place] [address]    Report Status 09/14/2021 PENDING   Incomplete   Color, Urine 09/15/2021 YELLOW (A)  YELLOW Final   APPearance 09/15/2021 CLEAR (A)  CLEAR Final   Specific Gravity, Urine 09/15/2021 1.019  1.005 - 1.030 Final   pH 09/15/2021 5.0  5.0 - 8.0 Final   Glucose, UA 09/15/2021 NEGATIVE  NEGATIVE mg/dL Final   Hgb urine dipstick 09/15/2021 NEGATIVE  NEGATIVE Final   Bilirubin Urine 09/15/2021 NEGATIVE  NEGATIVE Final   Ketones, ur 09/15/2021 NEGATIVE  NEGATIVE mg/dL Final   Protein, ur 09/15/2021 NEGATIVE  NEGATIVE mg/dL Final   Nitrite 09/15/2021 NEGATIVE  NEGATIVE Final   Leukocytes,Ua 09/15/2021 NEGATIVE  NEGATIVE Final   RBC / HPF 09/15/2021 0-5  0 - 5 RBC/hpf Final   WBC, UA 09/15/2021 0-5  0 - 5 WBC/hpf Final   Bacteria, UA 09/15/2021 RARE (A)  NONE SEEN Final   Squamous Epithelial / LPF 09/15/2021 0-5  0 - 5 Final   Mucus  09/15/2021 PRESENT   Final   SARS Coronavirus 2 by RT PCR 09/14/2021 NEGATIVE  NEGATIVE Final   Influenza A by PCR 09/14/2021 NEGATIVE  NEGATIVE Final   Influenza B by PCR 09/14/2021 NEGATIVE  NEGATIVE Final   Glucose-Capillary 09/14/2021 31 (LL)  70 - 99 mg/dL Final   Comment 1 09/14/2021 Repeat Test   Final   Glucose-Capillary 09/14/2021 74  70 - 99 mg/dL Final   Troponin I (High Sensitivity) 09/14/2021 32 (H)  <18 ng/L Final   Glucose-Capillary 09/14/2021 152 (H)  70 - 99 mg/dL Final   Sodium 09/15/2021 139  135 - 145 mmol/L Final   Potassium 09/15/2021 4.2  3.5 - 5.1 mmol/L Final   Chloride 09/15/2021 114 (H)  98 - 111 mmol/L Final   CO2 09/15/2021 18 (L)  22 - 32 mmol/L Final   Glucose, Bld 09/15/2021 287 (H)  70 - 99 mg/dL Final   BUN 09/15/2021 51 (H)  8 - 23 mg/dL Final   Creatinine, Ser 09/15/2021 3.25 (H)  0.44 - 1.00 mg/dL Final   Calcium 09/15/2021 7.9 (L)  8.9 - 10.3 mg/dL Final   GFR, Estimated 09/15/2021 13 (L)  >60 mL/min Final   Anion gap 09/15/2021 7  5 - 15 Final   WBC 09/15/2021 6.4  4.0 - 10.5 K/uL Final   RBC 09/15/2021 2.95 (L)  3.87 - 5.11 MIL/uL Final   Hemoglobin 09/15/2021 8.3 (L)  12.0 - 15.0 g/dL Final   HCT 09/15/2021 27.5 (L)  36.0 - 46.0 % Final   MCV 09/15/2021 93.2  80.0 - 100.0 fL Final   MCH 09/15/2021 28.1  26.0 - 34.0 pg Final   MCHC 09/15/2021 30.2  30.0 - 36.0 g/dL Final   RDW 09/15/2021 19.0 (H)  11.5 - 15.5 % Final   Platelets 09/15/2021 195  150 - 400 K/uL Final   nRBC 09/15/2021 0.0  0.0 - 0.2 % Final   Glucose-Capillary 09/15/2021 205 (H)  70 - 99 mg/dL Final   Glucose-Capillary 09/15/2021 137 (H)  70 - 99 mg/dL Final   Glucose-Capillary 09/15/2021 133 (H)  70 - 99 mg/dL Final   Glucose-Capillary 09/15/2021 93  70 - 99 mg/dL Final   Comment 1 09/15/2021 Notify RN   Final   Sodium 09/15/2021 142  135 - 145 mmol/L Final   Potassium 09/15/2021 4.0  3.5 - 5.1 mmol/L Final   Chloride 09/15/2021 117 (H)  98 - 111 mmol/L Final   CO2  09/15/2021 18 (L)  22 - 32 mmol/L Final   Glucose, Bld 09/15/2021 204 (H)  70 - 99 mg/dL Final   BUN 09/15/2021 46 (H)  8 - 23 mg/dL Final   Creatinine, Ser 09/15/2021 2.83 (H)  0.44 - 1.00 mg/dL Final   Calcium 09/15/2021 7.5 (L)  8.9 - 10.3 mg/dL Final   GFR, Estimated 09/15/2021 15 (L)  >60 mL/min Final   Anion gap 09/15/2021 7  5 - 15 Final   TSH 09/15/2021 1.486  0.350 - 4.500 uIU/mL Final   Sodium 09/16/2021 146 (H)  135 - 145 mmol/L Final   Potassium 09/16/2021 3.9  3.5 - 5.1 mmol/L Final   Chloride 09/16/2021 115 (H)  98 - 111 mmol/L Final   CO2 09/16/2021 24  22 - 32 mmol/L Final   Glucose, Bld 09/16/2021 94  70 - 99 mg/dL Final   BUN 09/16/2021 42 (H)  8 - 23 mg/dL Final   Creatinine, Ser 09/16/2021 2.49 (H)  0.44 - 1.00 mg/dL Final   Calcium 09/16/2021 7.8 (L)  8.9 - 10.3 mg/dL Final   GFR, Estimated 09/16/2021 18 (L)  >60 mL/min Final   Anion gap 09/16/2021 7  5 - 15 Final   Glucose-Capillary 09/15/2021 195 (H)  70 - 99 mg/dL Final   Procalcitonin 09/15/2021 0.12  ng/mL Final   Procalcitonin 09/16/2021 0.18  ng/mL Final   CRP 09/15/2021 2.6 (H)  <1.0 mg/dL Final   Adenovirus 09/15/2021 NOT DETECTED  NOT DETECTED Final   Coronavirus 229E 09/15/2021 NOT DETECTED  NOT DETECTED Final   Coronavirus HKU1 09/15/2021 NOT DETECTED  NOT DETECTED Final   Coronavirus NL63 09/15/2021 NOT DETECTED  NOT DETECTED Final   Coronavirus OC43 09/15/2021 NOT DETECTED  NOT DETECTED Final   Metapneumovirus 09/15/2021 NOT DETECTED  NOT DETECTED Final   Rhinovirus / Enterovirus 09/15/2021 NOT DETECTED  NOT DETECTED Final   Influenza A 09/15/2021 NOT DETECTED  NOT DETECTED Final   Influenza B 09/15/2021 NOT DETECTED  NOT DETECTED Final   Parainfluenza Virus 1 09/15/2021 NOT DETECTED  NOT DETECTED Final   Parainfluenza Virus 2 09/15/2021 NOT DETECTED  NOT DETECTED Final   Parainfluenza Virus 3 09/15/2021 NOT DETECTED  NOT DETECTED Final   Parainfluenza Virus 4 09/15/2021 NOT DETECTED  NOT DETECTED  Final   Respiratory Syncytial Virus 09/15/2021 NOT DETECTED  NOT DETECTED Final   Bordetella pertussis 09/15/2021 NOT DETECTED  NOT DETECTED Final   Bordetella Parapertussis 09/15/2021 NOT DETECTED  NOT DETECTED Final   Chlamydophila pneumoniae 09/15/2021 NOT DETECTED  NOT DETECTED Final   Mycoplasma pneumoniae 09/15/2021  NOT DETECTED  NOT DETECTED Final   Glucose-Capillary 09/15/2021 132 (H)  70 - 99 mg/dL Final   Glucose-Capillary 09/15/2021 98  70 - 99 mg/dL Final   Glucose-Capillary 09/16/2021 93  70 - 99 mg/dL Final   Glucose-Capillary 09/16/2021 86  70 - 99 mg/dL Final   Glucose-Capillary 09/16/2021 92  70 - 99 mg/dL Final   Glucose-Capillary 09/16/2021 100 (H)  70 - 99 mg/dL Final   Glucose-Capillary 09/17/2021 111 (H)  70 - 99 mg/dL Final    Imaging Studies  No results found. Medications   Scheduled Meds:  acetaminophen  650 mg Oral Q8H   Or   acetaminophen  650 mg Rectal Q8H   acetaminophen  1,000 mg Oral Once   collagenase  1 application Topical Daily   fluticasone  2 spray Each Nare Daily   insulin aspart  0-9 Units Subcutaneous Q4H   levothyroxine  100 mcg Oral Q0600   lidocaine  1 patch Transdermal Q24H   mupirocin cream  1 application Topical Daily   pantoprazole  20 mg Oral Daily   QUEtiapine  25 mg Oral QHS   tiotropium  18 mcg Inhalation Daily   No recently discontinued medications to reconcile  LOS: 2 days   Time spent: >66min  Richarda Osmond, DO [facility] 09/17/2021, 7:25 AM   Please refer to amion to contact the [facility] Attending or Consulting provider for this pt  [email or web address] Available by Epic secure chat 7AM-7PM. If 7PM-7AM, please contact night-coverage

## 2021-09-17 NOTE — Procedures (Signed)
Patient Name: Denise [name]  MRN: [id number]  Epilepsy Attending: Lora Havens  Referring Physician/Provider: Dr Lesleigh Noe Date: 09/17/2021 Duration: 21.12 mins  Patient history: 87 year old female with altered mental status.  EEG to evaluate for seizures.  Level of alertness: Awake  AEDs during EEG study: None  Technical aspects: This EEG study was done with scalp electrodes positioned according to the 10-20 International system of electrode placement. Electrical activity was acquired at a sampling rate of 500Hz  and reviewed with a high frequency filter of 70Hz  and a low frequency filter of 1Hz . EEG data were recorded continuously and digitally stored.   Description: EEG showed continuous generalized 3 to 6 Hz theta-delta slowing. Generalized  triphasic waves were also noted intermittently.  Hyperventilation and photic stimulation were not performed.     ABNORMALITY - Continuous slow, generalized - Triphasic waves, generalized  IMPRESSION: This study is suggestive of moderate diffuse encephalopathy, nonspecific etiology but likely related to toxic-metabolic causes. No seizures or definite epileptiform discharges were seen throughout the recording.  Denise [name]

## 2021-09-17 NOTE — ED Notes (Signed)
Pt is currently getting EEG. Per EEG tech, pt should be finished in about 25-30 minutes

## 2021-09-17 NOTE — Progress Notes (Signed)
CRITICAL CARE     Name: Denise [name] MRN: [id number] DOB: 1929-11-05     LOS: 2   SUBJECTIVE FINDINGS & SIGNIFICANT EVENTS    Patient description:  This is a 86yo F with hx of HTN, AF, DM2, chronic rhinits, ckd4, emphysema, hypothyroidism, chronic LE edema, physical deconditioning and Alzheimers dementia who came in after having cardiac arrest at home with CPR performed. She is in ER with confusion, rib fractures, pelvic fractures.  She has developed circulatory shock while in MICU and requires vasopressor support.  Patient admitted to stepdown with [facility] service with PCCM consultation for further evaluation and management.   09/16/21- patient is off pressors and is on room air.  I recommend transfer to medical floor or PCU.  Additionally patient should have palliative evaluation due to advanced age DNR/DNI status with severe comorbidities chronically and innappropirate for aggressive therapy.  Lines/tubes :   Microbiology/Sepsis markers: Results for orders placed or performed during the hospital encounter of 09/14/21  Blood culture (routine x 2)     Status: None (Preliminary result)   Collection Time: 09/14/21  9:37 PM   Specimen: Right Antecubital; Blood  Result Value Ref Range Status   Specimen Description RIGHT ANTECUBITAL  Final   Special Requests   Final    BOTTLES DRAWN AEROBIC AND ANAEROBIC Blood Culture adequate volume   Culture   Final    NO GROWTH 3 DAYS Performed at [facility], [address], [place], [place] [address]    Report Status PENDING  Incomplete  Blood culture (routine x 2)     Status: None (Preliminary result)   Collection Time: 09/14/21  9:42 PM   Specimen: BLOOD LEFT HAND  Result Value Ref Range Status   Specimen Description BLOOD LEFT HAND  Final   Special Requests    Final    BOTTLES DRAWN AEROBIC AND ANAEROBIC Blood Culture adequate volume   Culture   Final    NO GROWTH 3 DAYS Performed at [facility], [address], [place], [place] [address]    Report Status PENDING  Incomplete  Resp Panel by RT-PCR (Flu A&B, Covid) Nasopharyngeal Swab     Status: None   Collection Time: 09/14/21 10:46 PM   Specimen: Nasopharyngeal Swab; Nasopharyngeal(NP) swabs in vial transport medium  Result Value Ref Range Status   SARS Coronavirus 2 by RT PCR NEGATIVE NEGATIVE Final    Comment: (NOTE) SARS-CoV-2 target nucleic acids are NOT DETECTED.  The SARS-CoV-2 RNA is generally detectable in upper respiratory specimens during the acute phase of infection. The lowest concentration of SARS-CoV-2 viral copies this assay can detect is 138 copies/mL. A negative result does not preclude SARS-Cov-2 infection and should not be used as the sole basis for treatment or other patient management decisions. A negative result may occur with  improper specimen collection/handling, submission of specimen other than nasopharyngeal swab, presence of viral mutation(s) within the areas targeted by this assay, and inadequate number of viral copies(<138 copies/mL). A negative result must be combined with clinical observations, patient history, and epidemiological information. The expected result is Negative.  Fact Sheet for Patients:  EntrepreneurPulse.com.au  Fact Sheet for Healthcare Providers:  IncredibleEmployment.be  This test is no t yet approved or cleared by the [place] FDA and  has been authorized for detection and/or diagnosis of SARS-CoV-2 by FDA under an Emergency Use Authorization (EUA). This EUA will remain  in effect (meaning this test can be used) for the duration of the  COVID-19 declaration under Section 564(b)(1) of the Act, 21 U.S.C.section 360bbb-3(b)(1), unless the authorization is terminated  or revoked  sooner.       Influenza A by PCR NEGATIVE NEGATIVE Final   Influenza B by PCR NEGATIVE NEGATIVE Final    Comment: (NOTE) The Xpert Xpress SARS-CoV-2/FLU/RSV plus assay is intended as an aid in the diagnosis of influenza from Nasopharyngeal swab specimens and should not be used as a sole basis for treatment. Nasal washings and aspirates are unacceptable for Xpert Xpress SARS-CoV-2/FLU/RSV testing.  Fact Sheet for Patients: EntrepreneurPulse.com.au  Fact Sheet for Healthcare Providers: IncredibleEmployment.be  This test is not yet approved or cleared by the [place] FDA and has been authorized for detection and/or diagnosis of SARS-CoV-2 by FDA under an Emergency Use Authorization (EUA). This EUA will remain in effect (meaning this test can be used) for the duration of the COVID-19 declaration under Section 564(b)(1) of the Act, 21 U.S.C. section 360bbb-3(b)(1), unless the authorization is terminated or revoked.  Performed at [facility], [place], [place] [address]   Respiratory (~20 pathogens) panel by PCR     Status: None   Collection Time: 09/15/21  6:11 PM   Specimen: Nasopharyngeal Swab; Respiratory  Result Value Ref Range Status   Adenovirus NOT DETECTED NOT DETECTED Final   Coronavirus 229E NOT DETECTED NOT DETECTED Final    Comment: (NOTE) The Coronavirus on the Respiratory Panel, DOES NOT test for the novel  Coronavirus (2019 nCoV)    Coronavirus HKU1 NOT DETECTED NOT DETECTED Final   Coronavirus NL63 NOT DETECTED NOT DETECTED Final   Coronavirus OC43 NOT DETECTED NOT DETECTED Final   Metapneumovirus NOT DETECTED NOT DETECTED Final   Rhinovirus / Enterovirus NOT DETECTED NOT DETECTED Final   Influenza A NOT DETECTED NOT DETECTED Final   Influenza B NOT DETECTED NOT DETECTED Final   Parainfluenza Virus 1 NOT DETECTED NOT DETECTED Final   Parainfluenza Virus 2 NOT DETECTED NOT DETECTED Final    Parainfluenza Virus 3 NOT DETECTED NOT DETECTED Final   Parainfluenza Virus 4 NOT DETECTED NOT DETECTED Final   Respiratory Syncytial Virus NOT DETECTED NOT DETECTED Final   Bordetella pertussis NOT DETECTED NOT DETECTED Final   Bordetella Parapertussis NOT DETECTED NOT DETECTED Final   Chlamydophila pneumoniae NOT DETECTED NOT DETECTED Final   Mycoplasma pneumoniae NOT DETECTED NOT DETECTED Final    Comment: Performed at [facility], [place]. [address], [place], [place] [address]    Anti-infectives:  Anti-infectives (From admission, onward)    Start     Dose/Rate Route Frequency Ordered Stop   09/14/21 2330  ceFEPIme (MAXIPIME) 2 g in sodium chloride 0.9 % 100 mL IVPB        2 g 200 mL/hr over 30 Minutes Intravenous  Once 09/14/21 2329 09/15/21 0027   09/14/21 2330  vancomycin (VANCOCIN) IVPB 1000 mg/200 mL premix        1,000 mg 200 mL/hr over 60 Minutes Intravenous  Once 09/14/21 2329 09/15/21 0330       PAST MEDICAL HISTORY   Past Medical History:  Diagnosis Date   A-fib [facility])    Arthritis    Cataract    Chronic kidney disease    COPD (chronic obstructive pulmonary disease) ([place])    Diabetes mellitus without complication ([place])    Emphysema of lung ([place])    GERD (gastroesophageal reflux disease)    Hypertension    Hypothyroidism      SURGICAL HISTORY  Past Surgical History:  Procedure Laterality Date   AV FISTULA PLACEMENT Right 11/18/2015   Procedure: ARTERIOVENOUS (AV) FISTULA CREATION;  Surgeon: Algernon Huxley, MD;  Location: [facility] ORS;  Service: Vascular;  Laterality: Right;   CHOLECYSTECTOMY     GALLBLADDER SURGERY     HIP ARTHROPLASTY Right 07/31/2015   Procedure: ARTHROPLASTY BIPOLAR HIP (HEMIARTHROPLASTY);  Surgeon: Corky Mull, MD;  Location: [facility] ORS;  Service: Orthopedics;  Laterality: Right;   LIGATION OF ARTERIOVENOUS  FISTULA Right 03/14/2021   Procedure: LIGATION OF ARTERIOVENOUS  FISTULA  AND EVACUATION OF HEMATOMA;  Surgeon: Conrad Patoka, MD;   Location: [facility] ORS;  Service: Vascular;  Laterality: Right;   PACEMAKER IMPLANT N/A 11/29/2019   Procedure: PACEMAKER IMPLANT;  Surgeon: Constance Haw, MD;  Location: [place] CV LAB;  Service: Cardiovascular;  Laterality: N/A;   TEMPORARY PACEMAKER N/A 11/29/2019   Procedure: TEMPORARY PACEMAKER;  Surgeon: Isaias Cowman, MD;  Location: [place] CV LAB;  Service: Cardiovascular;  Laterality: N/A;     FAMILY HISTORY   Family History  Problem Relation Age of Onset   Diabetes Mother    Heart attack Mother    Stomach cancer Father    Colon cancer Sister    Diabetes Sister    Heart attack Brother    Colon cancer Brother    Breast cancer Neg Hx      SOCIAL HISTORY   Social History   Tobacco Use   Smoking status: Former    Types: Cigarettes    Quit date: 07/28/2013    Years since quitting: 8.1   Smokeless tobacco: Never  Substance Use Topics   Alcohol use: No   Drug use: No     MEDICATIONS   Current Medication:  Current Facility-Administered Medications:    0.9 %  sodium chloride infusion, 250 mL, Intravenous, Continuous, Dorothe Pea, RPH, Stopped at 09/15/21 1730   acetaminophen (TYLENOL) tablet 650 mg, 650 mg, Oral, Q8H **OR** acetaminophen (TYLENOL) suppository 650 mg, 650 mg, Rectal, Q8H, Doristine Mango L, MD, 650 mg at 09/16/21 2224   acetaminophen (TYLENOL) tablet 1,000 mg, 1,000 mg, Oral, Once, Nance Pear, MD   collagenase (SANTYL) ointment 1 application, 1 application, Topical, Daily, Mansy, Jan A, MD   dextrose 5 % in lactated ringers infusion, , Intravenous, Continuous, Sharion Settler, NP, Last Rate: 125 mL/hr at 09/17/21 0556, Rate Verify at 09/17/21 0556   diphenhydrAMINE (BENADRYL) injection 25 mg, 25 mg, Intravenous, QHS PRN, Mansy, Jan A, MD, 25 mg at 09/16/21 2219   fentaNYL (SUBLIMAZE) injection 12.5-25 mcg, 12.5-25 mcg, Intravenous, Q2H PRN, Richarda [place], MD, 25 mcg at 09/16/21 1030   fluticasone (FLONASE) 50  MCG/ACT nasal spray 2 spray, 2 spray, Each Nare, Daily, Mansy, Jan A, MD   haloperidol lactate (HALDOL) injection 2 mg, 2 mg, Intramuscular, Q6H PRN, Mansy, Jan A, MD, 2 mg at 09/17/21 0035   insulin aspart (novoLOG) injection 0-9 Units, 0-9 Units, Subcutaneous, Q4H, Sharion Settler, NP   levothyroxine (SYNTHROID) tablet 100 mcg, 100 mcg, Oral, Q0600, Mansy, Jan A, MD, 100 mcg at 09/16/21 0620   lidocaine (LIDODERM) 5 % 1 patch, 1 patch, Transdermal, Q24H, Richarda [place], MD, 1 patch at 09/16/21 1516   LORazepam (ATIVAN) injection 0.5 mg, 0.5 mg, Intravenous, Q4H PRN, Mansy, Jan A, MD, 0.5 mg at 09/16/21 2217   LORazepam (ATIVAN) injection 0.5 mg, 0.5 mg, Intramuscular, Q4H PRN, Richarda [place], MD   mupirocin cream (BACTROBAN) 2 % 1 application, 1 application,  Topical, Daily, Mansy, Jan A, MD   ondansetron (ZOFRAN) tablet 4 mg, 4 mg, Oral, Q6H PRN **OR** ondansetron (ZOFRAN) injection 4 mg, 4 mg, Intravenous, Q6H PRN, Mansy, Jan A, MD   pantoprazole (PROTONIX) EC tablet 20 mg, 20 mg, Oral, Daily, Mansy, Jan A, MD   QUEtiapine (SEROQUEL) tablet 25 mg, 25 mg, Oral, QHS, Mansy, Jan A, MD   tiotropium [facility]) inhalation capsule ([facility] use ONLY) 18 mcg, 18 mcg, Inhalation, Daily, Mansy, Jan A, MD   traZODone (DESYREL) tablet 25 mg, 25 mg, Oral, QHS PRN, Mansy, Jan A, MD  Current Outpatient Medications:    allopurinol (ZYLOPRIM) 100 MG tablet, Take 100 mg by mouth daily., Disp: , Rfl:    cetirizine (ZYRTEC) 5 MG tablet, Take 5 mg by mouth daily., Disp: , Rfl:    collagenase (SANTYL) ointment, Apply 1 application topically daily., Disp: , Rfl:    diltiazem (CARDIZEM CD) 120 MG 24 hr capsule, Take 120 mg by mouth daily., Disp: , Rfl:    fluticasone (FLONASE) 50 MCG/ACT nasal spray, Place 2 sprays into both nostrils daily., Disp: , Rfl:    furosemide (LASIX) 40 MG tablet, Take 40 mg by mouth daily., Disp: , Rfl:    insulin lispro (HUMALOG) 100 UNIT/ML injection, Inject 0-12 Units into the  skin 3 (three) times daily with meals. Called [facility] and spoke with Nichole (nurse at facility); patient is ordered Humalog 0-12 units TID before meals per correction scale (was ordered on 07/31/21), Disp: , Rfl:    levothyroxine (SYNTHROID) 100 MCG tablet, Take 100 mcg by mouth daily before breakfast., Disp: , Rfl:    LORazepam (ATIVAN) 0.5 MG tablet, Take 1 tablet by mouth every 4 (four) hours as needed., Disp: , Rfl:    metoprolol succinate (TOPROL-XL) 50 MG 24 hr tablet, Take 50 mg by mouth daily. Take with or immediately following a meal., Disp: , Rfl:    Multiple Vitamin (MULTIVITAMIN ADULT) TABS, Take by mouth., Disp: , Rfl:    mupirocin cream (BACTROBAN) 2 %, Apply 1 application topically daily., Disp: , Rfl:    oxyCODONE-acetaminophen (PERCOCET/ROXICET) 5-325 MG tablet, Take 2 tablets by mouth every 4 (four) hours as needed for severe pain., Disp: , Rfl:    pantoprazole (PROTONIX) 20 MG tablet, Take 20 mg by mouth daily., Disp: , Rfl:    polyethylene glycol (MIRALAX / GLYCOLAX) 17 g packet, Take 17 g by mouth daily., Disp: , Rfl:    QUEtiapine (SEROQUEL) 25 MG tablet, Take 25 mg by mouth daily., Disp: , Rfl:    QUEtiapine (SEROQUEL) 25 MG tablet, Take 3 tablets by mouth at bedtime., Disp: , Rfl:    senna (SENOKOT) 8.6 MG TABS tablet, Take 2 tablets by mouth daily., Disp: , Rfl:    simvastatin (ZOCOR) 40 MG tablet, Take 40 mg by mouth daily., Disp: , Rfl:    tiotropium (SPIRIVA) 18 MCG inhalation capsule, Place 18 mcg into inhaler and inhale daily., Disp: , Rfl:    valsartan (DIOVAN) 80 MG tablet, Take 80 mg by mouth daily., Disp: , Rfl:    vitamin C (ASCORBIC ACID) 500 MG tablet, Take 500 mg by mouth daily., Disp: , Rfl:    Cholecalciferol (VITAMIN D3) 50 MCG (2000 UT) TABS, Take by mouth. (Patient not taking: Reported on 09/15/2021), Disp: , Rfl:    diltiazem (CARTIA XT) 240 MG 24 hr capsule, Take 1 capsule (240 mg total) by mouth daily. (Patient not taking: Reported on 09/15/2021), Disp: 30  capsule, Rfl: 10  insulin detemir (LEVEMIR) 100 UNIT/ML injection, Inject 16 Units into the skin at bedtime. Called [facility] and spoke with Nichole (nurse at facility); pt was ordered Levemir 16 units QHS on 07/31/21, Disp: , Rfl:     ALLERGIES   Iodinated diagnostic agents, Benzocaine-chloroxylenol-hc, and Benzonatate    REVIEW OF SYSTEMS    Unable to perform due to dementia and acute altered mental status  PHYSICAL EXAMINATION   Vital Signs: Temp:  [99 F (37.2 C)] 99 F (37.2 C) (11/24 2234) Pulse Rate:  [93-144] 111 (11/25 0700) Resp:  [15-32] 30 (11/25 0700) BP: (122-173)/(53-118) 162/118 (11/25 0700) SpO2:  [90 %-94 %] 91 % (11/25 0700)  GENERAL:Age appropriate aggitated  HEAD: Normocephalic, atraumatic.  EYES: Pupils equal, round, reactive to light.  No scleral icterus.  MOUTH: Moist mucosal membrane. NECK: Supple. No thyromegaly. No nodules. No JVD.  PULMONARY: decreased air entry bilaterally  CARDIOVASCULAR: S1 and S2. Regular rate and rhythm. No murmurs, rubs, or gallops.  GASTROINTESTINAL: Soft, nontender, non-distended. No masses. Positive bowel sounds. No hepatosplenomegaly.  MUSCULOSKELETAL: erythema of dorsal aspect of upper extermities.  NEUROLOGIC: Mild distress due to acute illness + dementia SKIN:intact,warm,dry   PERTINENT DATA     Infusions:  sodium chloride Stopped (09/15/21 1730)   dextrose 5% lactated ringers 125 mL/hr at 09/17/21 0556   Scheduled Medications:  acetaminophen  650 mg Oral Q8H   Or   acetaminophen  650 mg Rectal Q8H   acetaminophen  1,000 mg Oral Once   collagenase  1 application Topical Daily   fluticasone  2 spray Each Nare Daily   insulin aspart  0-9 Units Subcutaneous Q4H   levothyroxine  100 mcg Oral Q0600   lidocaine  1 patch Transdermal Q24H   mupirocin cream  1 application Topical Daily   pantoprazole  20 mg Oral Daily   QUEtiapine  25 mg Oral QHS   tiotropium  18 mcg Inhalation Daily   PRN  Medications: diphenhydrAMINE, fentaNYL (SUBLIMAZE) injection, haloperidol lactate, LORazepam, LORazepam, ondansetron **OR** ondansetron (ZOFRAN) IV, traZODone Hemodynamic parameters:   Intake/Output: 11/24 0701 - 11/25 0700 In: -  Out: 500 [Urine:500]  Ventilator  Settings:    LAB RESULTS:  Basic Metabolic Panel: Recent Labs  Lab 09/14/21 2133 09/15/21 0530 09/15/21 1643 09/16/21 0756  NA 144 139 142 146*  K 4.0 4.2 4.0 3.9  CL 117* 114* 117* 115*  CO2 20* 18* 18* 24  GLUCOSE 41* 287* 204* 94  BUN 58* 51* 46* 42*  CREATININE 3.86* 3.25* 2.83* 2.49*  CALCIUM 8.4* 7.9* 7.5* 7.8*    Liver Function Tests: No results for input(s): AST, ALT, ALKPHOS, BILITOT, PROT, ALBUMIN in the last 168 hours. No results for input(s): LIPASE, AMYLASE in the last 168 hours. No results for input(s): AMMONIA in the last 168 hours. CBC: Recent Labs  Lab 09/14/21 2133 09/15/21 0530  WBC 6.1 6.4  NEUTROABS 5.0  --   HGB 9.2* 8.3*  HCT 29.2* 27.5*  MCV 93.6 93.2  PLT 201 195    Cardiac Enzymes: No results for input(s): CKTOTAL, CKMB, CKMBINDEX, TROPONINI in the last 168 hours. BNP: Invalid input(s): POCBNP CBG: Recent Labs  Lab 09/16/21 0904 09/16/21 1158 09/16/21 1811 09/16/21 2213 09/17/21 0038  GLUCAP 93 86 92 100* 111*        IMAGING RESULTS:  Imaging: No results found. @PROBHOSP @ No results found.      ASSESSMENT AND PLAN    -Multidisciplinary rounds held today  Circulatory shock-  RESOLVED    -  Shock physiology resolved post rehydration fluid delivery     - patient off levophed on room air in no distress     -source is unclear possible dehydration while at [facility]    - she had CPR but it was unclear if she actually lost pulse at any time.  -repeat TTE - previous 11/2019 - EF 65% with adequate parameters throughout    Renal Failure-chronic - hypertensive nephropathy -follow chem 7 -follow UO -continue Foley Catheter-assess need daily   Advanced  dementia    Episodes of aggitation with sundowing  -possible septic encephalopathy concomitantly    Atrial fibrillation -chronic -rate controlled    -S/p ppm   - patient now off levophed - telemetry monitoring    - repeat TTE   Chronic COPD   Patient does not seem to have acute COPD exacerbation   ID -continue IV abx as prescibed -follow up cultures  GI/Nutrition GI PROPHYLAXIS as indicated DIET-->TF's as tolerated Constipation protocol as indicated  ENDO - ICU hypoglycemic\Hyperglycemia protocol -check FSBS per protocol   ELECTROLYTES -follow labs as needed -replace as needed -pharmacy consultation   DVT/GI PRX ordered -SCDs  TRANSFUSIONS AS NEEDED MONITOR FSBS ASSESS the need for LABS as needed   Critical care provider statement:   Total critical care time: 33 minutes   Performed by: Lanney Gins MD   Critical care time was exclusive of separately billable procedures and treating other patients.   Critical care was necessary to treat or prevent imminent or life-threatening deterioration.   Critical care was time spent personally by me on the following activities: development of treatment plan with patient and/or surrogate as well as nursing, discussions with consultants, evaluation of patient's response to treatment, examination of patient, obtaining history from patient or surrogate, ordering and performing treatments and interventions, ordering and review of laboratory studies, ordering and review of radiographic studies, pulse oximetry and re-evaluation of patient's condition.    Ottie Glazier, M.D.  Pulmonary & Critical Care Medicine

## 2021-09-17 NOTE — ED Notes (Signed)
Pt resting and moaning at this time. Pt is not able to express needs but it is known to have rib FX, PRN medications to be given for pain.   PO meds and sprivia inhaler not given due to pt not being able to follow simple commands to swallow (pt also NPO). NAD noted.

## 2021-09-17 NOTE — Consult Note (Signed)
Neurology Consultation Reason for Consult: Altered mental status Requesting Physician: Chelsea Anderson  CC: Admitted for loss of consciousness  History is obtained from: Chart review and daughter via telephone  HPI: Denise [name] is a 87 y.o. female with a past medical history significant for atrial fibrillation not on anticoagulation, sinus pauses s/p pacemaker (February 2021) hyperlipidemia, hypertension, type 2 diabetes, hypothyroidism, COPD, dementia, CKD stage IV.  She presented to the ED on 11/22 this admission with unclear history from her facility.  Reportedly she was found slumped over on the table and CPR was subsequently initiated though it is unclear whether she did ever lose pulses.  On arrival of EMS she was found to be significantly hypoglycemic to 30 for which she was given dextrose.  Per inpatient diabetes program notes, "Nurse reported that patient is ordered Levemir 16 units QHS (last given on 09/13/21) and Humalog sliding scale 0-12 units TID before meal (correction starts with glucose of 121 mg/dl; last given Humalog 8 units at 17:00 on 09/14/21 for glucose of 268 mg/dl).  Updated home medication list to reflect information obtained from the nurse at [facility]. In looking over chart, noted patient was last inpatient 07/19/21-07/21/21 and all insulin was discontinued per discharge summary on 07/21/21. The nurse at [facility] reported that the current orders for Levemir and Humalog was started on 07/31/21." "Recommendations: At time of discharge, may want to consider decreasing or discontinuing outpatient Levemir dose and decreasing Humalog correction scale to 0-9 units TID with meals (similar to the scale used while inpatient). Would not recommend tight glucose control given patient is 87 years old and high risk for falls and injury if she has hypoglycemia."  Her course was complicated by hypotension requiring pressors and a bicarb drip, no etiology was found for this and it was presumed to be  secondary to dehydration.  She also had a significant acute kidney injury on her CKD with creatinine improving from 3.25 on admission to 2.49 today.  Additional work-up has included negative urine culture, blood cultures, chest x-ray, respiratory viral panel, normal TSH at 1.486, and stable troponin (34 to 32 over 2 hours).  She has had overall improving but waxing and waning mental status, is frequently agitated and has been pulling out lines.  She was last seen in her normal state on October 31 by her daughter she was able to hold some conversation, oriented to self and place.  She is noted to be incontinent at baseline and requires assistance to ambulate.  Her daughter has difficulty checking in on her by phone as the patient is unable to answer her phone (the phone was jerked was out of the wall). Phone was subsequently replaced and then one time she called back, maybe had the receiver upside.   Per cardiology notes, she her pacemaker was implanted February 2021.  She was also discovered to have an AVF hematoma which occurred spontaneous (noted after lifting her dog and hearing something popping in her right arm) in May 2022 for which Eliquis was stopped and which required surgical intervention secondary to acute drop in hemoglobin. Subsequently she had a mechanical fall in September 2022 for which she was hospitalized secondary to a pelvic fracture which was managed nonoperatively. Has been essentially non-ambulatory secondary to pain, mostly wheelchair bound.   Daughter notes that due to her own transportation and health issues, she is not able to visit with her mother as much as she would like.  He was last able to see the patient   on the patient birthday.  She noted that although at the time the patient was conversational, her speech was garbled "off the walls words in her sentences," and didn't know who daughter was when she initially walked into (hadn't failed to recognize daughter prior to this).  When living in the same facility as her daughter she didn't like to engage in her medical care (would cancel her doctor's appointments) but didn't like to be by herself, did want to be near her daughter.  Prior to the summer she also had another resident of the facility who had actually moved in with her and was helping her with many of her ADLs such as cooking her breakfast etc. egarding goals of care: As of June 2022, the patient was seen by [facility] care palliative for an initial visit at which time she was full CODE STATUS, noted to have some moderate cognitive impairment but able to recount the history of her recently failed AV fistula.  At her August 2022 visit she was noted to complain of a poor appetite though her weight was found to be stable and staff reported intake of 75 to 100% of her meal trays.  Reportedly her daughter is having difficulty visiting due to her own health and transportation issues and at times she had gotten dehydrated in the setting of poor oral intake, getting fluids via dialysis.  "However she is angry about this and has taken the needle out several times" she was noted to have a dropping GFR but again remained a full CODE STATUS.  October palliative care notes (07/27/2021) noted her GFR was 22 and that the patient's daughter had revoked her DNR CODE STATUS although they were unable to reach daughter at the time per September discharge summary, Dr. Max Sane had discussed goals of care with the patient's daughter and there was a decision to transition to comfort/hospice in the setting of her inoperable pelvic fracture; all of her medications other than comfort medications were discontinued.  However CPR was performed at this facility, and the paperwork reviewed by Dr. Ouida Sills this admission does not seem to reflect DNR status.  In my conversation with her daughter she does not seem very familiar with the role of palliative care, and when I try to explain it briefly to her she  asked me if it would mean that her mother gets transferred to a different hospital for additional treatment.  Unfortunately due to other acute patient concerns, our conversation was cut short at that time.  Premorbid modified rankin scale:      4 - Moderately severe disability. Unable to attend to own bodily needs without assistance, and unable to walk unassisted.  ROS: Unable to obtain due to altered mental status.   Past Medical History:  Diagnosis Date   A-fib [facility])    Arthritis    Cataract    Chronic kidney disease    COPD (chronic obstructive pulmonary disease) ([place])    Diabetes mellitus without complication (HCC)    Emphysema of lung (HCC)    GERD (gastroesophageal reflux disease)    Hypertension    Hypothyroidism    Past Surgical History:  Procedure Laterality Date   AV FISTULA PLACEMENT Right 11/18/2015   Procedure: ARTERIOVENOUS (AV) FISTULA CREATION;  Surgeon: Algernon Huxley, MD;  Location: [facility] ORS;  Service: Vascular;  Laterality: Right;   CHOLECYSTECTOMY     GALLBLADDER SURGERY     HIP ARTHROPLASTY Right 07/31/2015   Procedure: ARTHROPLASTY BIPOLAR HIP (HEMIARTHROPLASTY);  Surgeon: Jenny Reichmann  Robby Sermon, MD;  Location: [facility] ORS;  Service: Orthopedics;  Laterality: Right;   LIGATION OF ARTERIOVENOUS  FISTULA Right 03/14/2021   Procedure: LIGATION OF ARTERIOVENOUS  FISTULA  AND EVACUATION OF HEMATOMA;  Surgeon: Conrad Early, MD;  Location: [facility] ORS;  Service: Vascular;  Laterality: Right;   PACEMAKER IMPLANT N/A 11/29/2019   Procedure: PACEMAKER IMPLANT;  Surgeon: Constance Haw, MD;  Location: [place] [facility];  Service: Cardiovascular;  Laterality: N/A;   TEMPORARY PACEMAKER N/A 11/29/2019   Procedure: TEMPORARY PACEMAKER;  Surgeon: Isaias Cowman, MD;  Location: [place] [facility];  Service: Cardiovascular;  Laterality: N/A;   Current Outpatient Medications  Medication Instructions   allopurinol (ZYLOPRIM) 100 mg, Oral, Daily   cetirizine (ZYRTEC) 5 mg, Oral, Daily    Cholecalciferol (VITAMIN D3) 50 MCG (2000 UT) TABS Take by mouth.   collagenase (SANTYL) ointment 1 application, Topical, Daily   diltiazem (CARDIZEM CD) 120 mg, Oral, Daily   diltiazem (CARTIA XT) 240 mg, Oral, Daily   fluticasone (FLONASE) 50 MCG/ACT nasal spray 2 sprays, Each Nare, Daily   furosemide (LASIX) 40 mg, Oral, Daily   insulin detemir (LEVEMIR) 16 Units, Subcutaneous, Daily at bedtime, Called [facility] and spoke with Nichole (nurse at facility); pt was ordered Levemir 16 units QHS on 07/31/21   insulin lispro (HUMALOG) 0-12 Units, Subcutaneous, 3 times daily with meals, Called [facility] and spoke with Nichole (nurse at facility); patient is ordered Humalog 0-12 units TID before meals per correction scale (was ordered on 07/31/21)   levothyroxine (SYNTHROID) 100 mcg, Oral, Daily before breakfast   LORazepam (ATIVAN) 0.5 MG tablet 1 tablet, Oral, Every 4 hours PRN   metoprolol succinate (TOPROL-XL) 50 mg, Oral, Daily, Take with or immediately following a meal.   Multiple Vitamin (MULTIVITAMIN ADULT) TABS Oral   mupirocin cream (BACTROBAN) 2 % 1 application, Topical, Daily   oxyCODONE-acetaminophen (PERCOCET/ROXICET) 5-325 MG tablet 2 tablets, Oral, Every 4 hours PRN   pantoprazole (PROTONIX) 20 mg, Oral, Daily   polyethylene glycol (MIRALAX / GLYCOLAX) 17 g, Oral, Daily   QUEtiapine (SEROQUEL) 25 MG tablet 3 tablets, Oral, Daily at bedtime   QUEtiapine (SEROQUEL) 25 mg, Oral, Daily   senna (SENOKOT) 8.6 MG TABS tablet 2 tablets, Oral, Daily   simvastatin (ZOCOR) 40 mg, Oral, Daily   tiotropium (SPIRIVA) 18 mcg, Inhalation, Daily   valsartan (DIOVAN) 80 mg, Oral, Daily   vitamin C (ASCORBIC ACID) 500 mg, Oral, Daily   Family History  Problem Relation Age of Onset   Diabetes Mother    Heart attack Mother    Stomach cancer Father    Colon cancer Sister    Diabetes Sister    Heart attack Brother    Colon cancer Brother    Breast cancer Neg Hx     Social History:  reports that she  quit smoking about 8 years ago. She has never used smokeless tobacco. She reports that she does not drink alcohol and does not use drugs.   Exam: Current vital signs: BP (!) 164/92   Pulse (!) 113   Temp 99 F (37.2 C) (Axillary)   Resp (!) 27   Ht 5' 5" (1.651 m)   Wt 70 kg   SpO2 91%   BMI 25.68 kg/m  Vital signs in last 24 hours: Temp:  [99 F (37.2 C)] 99 F (37.2 C) (11/24 2234) Pulse Rate:  [95-144] 113 (11/25 0920) Resp:  [15-32] 27 (11/25 0920) BP: (122-173)/(64-118) 164/92 (11/25 0920) SpO2:  [90 %-  94 %] 91 % (11/25 0920)   Physical Exam  Constitutional: Appears chronically ill and acutely uncomfortable, writhing in bed and coughing intermittently Psych: Affect flat Eyes: No scleral injection HENT: Dry cracked lips, no obstruction MSK: no joint deformities.  Cardiovascular: Irregularly irregular rhythm Respiratory: Coughs frequently GI: Soft.  No distension. There is no tenderness.  Skin: Many bruises throughout  Neuro: Mental Status:  Patient is moaning nearly continuously in bed.  On examination she does tell the examiner "your hands are cold", and at one point states "my mouth is dry" when asked to protrude her tongue.  Otherwise her speech is very dysarthric and I am unable to parse any other meaningful conversation.  She does not reliably follow commands, with poor attention Cranial Nerves: II: Visual Fields are full by blink to threat. Pupils are equal, round, and reactive to light.   III,IV, VI: Tracks examiner in all directions V: Facial sensation is symmetric light eyelash brush VII: Facial movement is symmetric.  VIII: hearing is intact to voice X/XI: Unable to assess secondary to patient's mental status . XII: tongue protrudes minimally to command  Motor: Significant paratonia.  Some apparent loss of bulk in the lower extremities especially, mitt in place in the right upper extremity but not the left upper extremity which clutches her EKG leads but  does move purposefully towards lightly noxious stimuli.  Per nursing she is more likely does dislodge medical equipment with the right hand than the left although she moves both grossly equally Sensory: Equally reactive to touch in all 4 extremities Deep Tendon Reflexes: 3+ and symmetric in the biceps Plantars: briskly withdraws bilaterally Cerebellar: Unable to assess secondary to patient's mental status    I have reviewed labs in epic and the results pertinent to this consultation are: As documented in detail in HPI above "She also had a significant acute kidney injury on her CKD with creatinine improving from 3.25 on admission to 2.49 today.  Additional work-up has included negative urine culture, blood cultures, chest x-ray, respiratory viral panel, normal TSH at 1.486, and stable troponin (34 to 32 over 2 hours)"  I have reviewed the images obtained:  CT abdomen pelvis 11/22: 1. Acute minimally displaced bilateral sixth rib fractures. 2. Subacute healing fractures through the left iliac bone, superior ramus, and inferior ramus. 3. Chronic dilation of the common bile duct related to prior cholecystectomy. 4. Otherwise no acute intra-abdominal or intrapelvic process.  Personally reviewed: CT C-spine 11/22 personally reviewed, agree with radiology that there are multilevel degenerative changes without clear acute process Head CT 09/14/2021 without acute intracranial process, repeat head CT today on my recommendation also stable  Impression: Presentation most consistent with acute delirium on dementia, in the setting of dehydration, polypharmacy (opiates as well as IV diphenhydramine), potentially some additional brain injury secondary to her hypoglycemic episode less likely mild anoxic injury given no convincing documentation of cardiac arrest, pain secondary to rib fractures and pelvic fractures, and disrupted sleep cycle secondary to hospitalization.  Certainly in the setting of  atrial fibrillation not due to coagulation she is also at risk of stroke.  Repeat head CT today was obtained to evaluate for large stroke and fortunately does not show significant new hypodensity though small strokes may certainly be missed.  Patients with dementia are additionally at risk for developing seizures as so we will assess with for this with EEG.  Discussed with daughter that each subsequent hospitalization in patients with advancing dementia does tend to lead to   further stepwise decline in their function with rare return to full prior level of functionality.  Discussed that MRI brain cannot be obtained at this facility due to the patient's pacemaker; additionally it is not a necessary test at this time given that it would not change management significantly given her contraindications to anticoagulation.    Recommendations: - Delirium / Encephalopathy workup: LFTs, ammonia (resulted normal at 16), B12, methylmalonic acid, Vit E, thiamine   -She does have further elevations of alk phos and AST, further work-up of this per primary team  -Follow-up and treatment of abnormalities per primary team/PCP - Please give high dose IV thiamine (400 mg q8 IV x 3 days followed by 100 mg q8h IV while hospitalized, discharge on 100 mg PO daily) -- to be ordered AFTER thiamine level has been collected (ordered).  May be discontinued if thiamine level results normal.  If it results low, please continue 1000 mg daily IV while inpatient and transition to p.o. on discharge - Monitor for infectious causes (follow fever curve); consider repeat CXR given cough and high aspiration risk due to mental status - Routine EEG; - Please discontinue Benadryl as this may be playing a significant role in delirium for this patient - Given atrial fibrillation not on anticoagulation, repeat head CT was recommended to more sensitively evaluate for stroke as imaging findings can take hours to develop on head CT (completed negative as  documented above) - Agree with palliative care consult  Additionally, please follow these delirium precautions:  - try to minimize deliriogenic medications as much as possible (J Am Geriatr Soc. 2012 Apr;60(4):616-31): benzodiazepines, anticholinergics, diphenhydramine, antihistamines, narcotics, Ambien/Lunesta/Sonata etc. - environmental support for delirium: Lights on during the day, patient up and out of bed as much as is feasible, OT/PT, quiet dimly lit room at night, reorient patient often, provide hearing aides and glasses if patient uses them routinely, minimize sleep disruptions as much as possible overnight.  As much as possible, reorient patient, and have them engage patient in activities, e.g. playing cards. TV should be off or on neutral background music unless patient engaged and watching. Try to keep interactions with the patient calm and quiet.     -Neurology will follow up EEG but otherwise will be available on an as-needed basis going forward.  Please reach out if any questions or concerns arise.  Lesleigh Noe MD-PhD [facility] [phone] [facility] coverage for [facility] is from 8 AM to 4 AM in-house and 4 PM to 8 PM by telephone/video. 8 PM to 8 AM emergent questions or overnight urgent questions should be addressed to Teleneurology On-call or [facility] neurohospitalist; contact information can be found on AMION

## 2021-09-18 ENCOUNTER — Inpatient Hospital Stay: Payer: Medicare Other

## 2021-09-18 DIAGNOSIS — K219 Gastro-esophageal reflux disease without esophagitis: Secondary | ICD-10-CM | POA: Diagnosis not present

## 2021-09-18 DIAGNOSIS — N184 Chronic kidney disease, stage 4 (severe): Secondary | ICD-10-CM | POA: Diagnosis not present

## 2021-09-18 DIAGNOSIS — Z9049 Acquired absence of other specified parts of digestive tract: Secondary | ICD-10-CM | POA: Diagnosis not present

## 2021-09-18 DIAGNOSIS — G9341 Metabolic encephalopathy: Secondary | ICD-10-CM | POA: Diagnosis not present

## 2021-09-18 DIAGNOSIS — F02811 Dementia in other diseases classified elsewhere, unspecified severity, with agitation: Secondary | ICD-10-CM | POA: Diagnosis not present

## 2021-09-18 DIAGNOSIS — R578 Other shock: Secondary | ICD-10-CM | POA: Diagnosis not present

## 2021-09-18 DIAGNOSIS — T68XXXA Hypothermia, initial encounter: Secondary | ICD-10-CM | POA: Diagnosis not present

## 2021-09-18 DIAGNOSIS — E11649 Type 2 diabetes mellitus with hypoglycemia without coma: Secondary | ICD-10-CM | POA: Diagnosis not present

## 2021-09-18 DIAGNOSIS — I129 Hypertensive chronic kidney disease with stage 1 through stage 4 chronic kidney disease, or unspecified chronic kidney disease: Secondary | ICD-10-CM | POA: Diagnosis not present

## 2021-09-18 DIAGNOSIS — E162 Hypoglycemia, unspecified: Secondary | ICD-10-CM | POA: Diagnosis not present

## 2021-09-18 DIAGNOSIS — J439 Emphysema, unspecified: Secondary | ICD-10-CM | POA: Diagnosis not present

## 2021-09-18 DIAGNOSIS — E860 Dehydration: Secondary | ICD-10-CM | POA: Diagnosis not present

## 2021-09-18 DIAGNOSIS — M96A3 Multiple fractures of ribs associated with chest compression and cardiopulmonary resuscitation: Secondary | ICD-10-CM | POA: Diagnosis not present

## 2021-09-18 DIAGNOSIS — G309 Alzheimer's disease, unspecified: Secondary | ICD-10-CM | POA: Diagnosis not present

## 2021-09-18 DIAGNOSIS — E039 Hypothyroidism, unspecified: Secondary | ICD-10-CM | POA: Diagnosis not present

## 2021-09-18 DIAGNOSIS — F0284 Dementia in other diseases classified elsewhere, unspecified severity, with anxiety: Secondary | ICD-10-CM | POA: Diagnosis not present

## 2021-09-18 DIAGNOSIS — N179 Acute kidney failure, unspecified: Secondary | ICD-10-CM | POA: Diagnosis not present

## 2021-09-18 DIAGNOSIS — R0600 Dyspnea, unspecified: Secondary | ICD-10-CM

## 2021-09-18 DIAGNOSIS — Z515 Encounter for palliative care: Secondary | ICD-10-CM | POA: Diagnosis not present

## 2021-09-18 DIAGNOSIS — R410 Disorientation, unspecified: Secondary | ICD-10-CM

## 2021-09-18 DIAGNOSIS — Z20822 Contact with and (suspected) exposure to covid-19: Secondary | ICD-10-CM | POA: Diagnosis not present

## 2021-09-18 DIAGNOSIS — Z66 Do not resuscitate: Secondary | ICD-10-CM | POA: Diagnosis not present

## 2021-09-18 DIAGNOSIS — E1122 Type 2 diabetes mellitus with diabetic chronic kidney disease: Secondary | ICD-10-CM | POA: Diagnosis not present

## 2021-09-18 DIAGNOSIS — I480 Paroxysmal atrial fibrillation: Secondary | ICD-10-CM | POA: Diagnosis not present

## 2021-09-18 DIAGNOSIS — E872 Acidosis, unspecified: Secondary | ICD-10-CM | POA: Diagnosis not present

## 2021-09-18 DIAGNOSIS — J9811 Atelectasis: Secondary | ICD-10-CM | POA: Diagnosis not present

## 2021-09-18 LAB — CBC
HCT: 27.3 % — ABNORMAL LOW (ref 36.0–46.0)
Hemoglobin: 8.6 g/dL — ABNORMAL LOW (ref 12.0–15.0)
MCH: 28.8 pg (ref 26.0–34.0)
MCHC: 31.5 g/dL (ref 30.0–36.0)
MCV: 91.3 fL (ref 80.0–100.0)
Platelets: 178 10*3/uL (ref 150–400)
RBC: 2.99 MIL/uL — ABNORMAL LOW (ref 3.87–5.11)
RDW: 19.5 % — ABNORMAL HIGH (ref 11.5–15.5)
WBC: 5.8 10*3/uL (ref 4.0–10.5)
nRBC: 0 % (ref 0.0–0.2)

## 2021-09-18 LAB — BASIC METABOLIC PANEL
Anion gap: 6 (ref 5–15)
BUN: 35 mg/dL — ABNORMAL HIGH (ref 8–23)
CO2: 26 mmol/L (ref 22–32)
Calcium: 8 mg/dL — ABNORMAL LOW (ref 8.9–10.3)
Chloride: 116 mmol/L — ABNORMAL HIGH (ref 98–111)
Creatinine, Ser: 1.84 mg/dL — ABNORMAL HIGH (ref 0.44–1.00)
GFR, Estimated: 26 mL/min — ABNORMAL LOW (ref >60)
Glucose, Bld: 174 mg/dL — ABNORMAL HIGH (ref 70–99)
Potassium: 3.6 mmol/L (ref 3.5–5.1)
Sodium: 148 mmol/L — ABNORMAL HIGH (ref 135–145)

## 2021-09-18 LAB — GLUCOSE, CAPILLARY
Glucose-Capillary: 122 mg/dL — ABNORMAL HIGH (ref 70–99)
Glucose-Capillary: 124 mg/dL — ABNORMAL HIGH (ref 70–99)
Glucose-Capillary: 127 mg/dL — ABNORMAL HIGH (ref 70–99)
Glucose-Capillary: 131 mg/dL — ABNORMAL HIGH (ref 70–99)
Glucose-Capillary: 178 mg/dL — ABNORMAL HIGH (ref 70–99)
Glucose-Capillary: 92 mg/dL (ref 70–99)

## 2021-09-18 MED ORDER — QUETIAPINE FUMARATE 25 MG PO TABS
75.0000 mg | ORAL_TABLET | Freq: Every day | ORAL | Status: DC
  Filled 2021-09-18 (×2): qty 3

## 2021-09-18 MED ORDER — GLYCOPYRROLATE 0.2 MG/ML IJ SOLN
0.1000 mg | Freq: Once | INTRAMUSCULAR | Status: AC
  Administered 2021-09-18: 0.1 mg via INTRAVENOUS
  Filled 2021-09-18: qty 0.5

## 2021-09-18 MED ORDER — GLYCOPYRROLATE 1 MG PO TABS
1.0000 mg | ORAL_TABLET | Freq: Two times a day (BID) | ORAL | Status: AC
  Filled 2021-09-18 (×2): qty 1

## 2021-09-18 MED ORDER — DILTIAZEM HCL ER COATED BEADS 120 MG PO CP24
120.0000 mg | ORAL_CAPSULE | Freq: Every day | ORAL | Status: DC
  Administered 2021-09-19: 12:00:00 120 mg via ORAL
  Filled 2021-09-18: qty 1

## 2021-09-18 MED ORDER — METOPROLOL SUCCINATE ER 50 MG PO TB24: 50.0000 mg | ORAL_TABLET | Freq: Every day | ORAL | Status: DC

## 2021-09-18 MED ORDER — METOPROLOL TARTRATE 5 MG/5ML IV SOLN
5.0000 mg | Freq: Three times a day (TID) | INTRAVENOUS | Status: DC
  Administered 2021-09-18 – 2021-09-19 (×2): 5 mg via INTRAVENOUS
  Filled 2021-09-18 (×2): qty 5

## 2021-09-18 MED ORDER — GUAIFENESIN ER 600 MG PO TB12: 600.0000 mg | ORAL_TABLET | Freq: Two times a day (BID) | ORAL | Status: AC

## 2021-09-18 NOTE — Plan of Care (Signed)
  Problem: Education: Goal: Knowledge of General Education information will improve Description: Including pain rating scale, medication(s)/side effects and non-pharmacologic comfort measures Outcome: Not Progressing   Problem: Nutrition: Goal: Adequate nutrition will be maintained Outcome: Not Progressing   Problem: Coping: Goal: Level of anxiety will decrease Outcome: Not Progressing

## 2021-09-18 NOTE — Progress Notes (Signed)
RN taking care of this patient called this RN for me to look at patient.  Upon viewing patient noted to have congestion, and sounds like patient might be aspirating.  Oxygen saturation is 90% on RA so I suggested placing some oxygen at 2 L [place].  Suctioned patient with improvement of lung sounds.  Nurse at bedside calling MD for further orders.

## 2021-09-18 NOTE — Progress Notes (Addendum)
PROGRESS NOTE  Denise [name]    DOB: [birth year], 87 y.o.  GMW:[id number]  PCP: Denise Koyanagi, MD   Code Status: DNR   DOA: 09/14/2021   LOS: 3  Brief Narrative of Current Hospitalization  Denise [name] is a 87 y.o. female with a PMH significant for A. fib, CKD, COPD, type II DM, GERD, HTN, hypothyroidism. They presented from ALF to the ED on 09/14/2021 with altered mental status acutely.  She received chest compressions at her ALF which resulted in multiple fractured ribs.  EMS arrived and evaluated that she had a glucose level of 30 and provided her with an amp of D50.  In the ED, it was found that they had a glucose of 78 and minimally responsive. They were treated with IV fluids and glucose.  Patient was admitted to medicine service for further workup and management of hypoglycemia and AMS as outlined in detail below.  09/18/21 -stable, improved  Assessment & Plan  Principal Problem:   Hypoglycemia Active Problems:   Sepsis ([place])  Hypoglycemic event- resolved.  Type II DM- Has been receiving D5 with fluids since admission and NPO.  - continue sSSI and frequent CBGs  Acute metabolic encephalitis-  Vascular shock- Criteria for shock have resolved. No acute findings on head imaging, except for frontal scalp hematoma. No source on urine studies or chest/abdominal imaging. Has been off levophed since about 0800 11/24 and maintaining Bps which are now significantly elevated. BxCx NGTD.  - consulted neurology, appreciate recs  - EEG - palliative care consult for [place] discussion as well as confirm code status - NPO - PRN analgesia from multiple rib fractures as well as scheduled tylenol/dilaudid since patient is unable to verbalize requests. - f/u cultures - discontinued benadryl- do not restart. - discontinued IV fluids today due to hypervolemia on exam and hyper- Na/Cl - SLP evaluation  HTN- elevated since she has been intravascularly repleted. reordering her home meds with  exception of ACE/ARB due to kidney injury.  - restart diltiazem, metoprolol  Abnormal breathing- patient is breathing comfortably while sleeping but when I wake her up, she has significant stertor. Lung fields are clear and has normal work of breathing. Stable ORA. My concern is that taking shallow breaths due to rib fractures will lead to atelectasis>pneumonia. Patient unable to comply with incentive spirometer.  - chest xray - incentive spirometer when more oriented and able to follow commands.  - trial of robinal and mucinex for the day  AKI on CKD- improving Cr 2.83>>1.84. appears to be about baseline - BMP am - strict I/O  Acute rib fractures, healing pelvic fracture-  - analgesia as above - lidocaine patch  Left forearm erythema- essentially resolved. Non-tender to touch  Afib- rate controlled. No anticoagulation.  - repeat echo when can be cooperative - restarted rate control today  Hypothyroidism- TSH normal on admission - continue levothyroxine  Dementia  Anxiety  delirium- peak resident. patient has baseline incontinence and disorientation but able to hold conversations with her daughter. Their last conversation was 10/31. Daughter states that her mom has had gradual decline over past several months/years. - continue ativan PRN for anxiety - continue to await palliative interventions  COPD- chronic, stable  GERD-   DVT prophylaxis: SCDs.   Diet:  Diet Orders (From admission, onward)     Start     Ordered   09/16/21 0617  Diet NPO time specified Except for: Sips with Meds  Diet effective now  Question:  Except for  Answer:  Sips with Meds   09/16/21 0616            Subjective 09/18/21    Pt asleep on entering room but awakes easily. Able to tell me her full name but then keeps repeating her name. No further conversation or ability to follow commands.  Disposition Plan & Communication  Patient status: Inpatient  Admitted From:  ILF Disposition:  Nursing home Anticipated discharge date: TBD  Family Communication: daughter on phone Consults, Procedures, Significant Events  Consultants:  CCM  Procedures/significant events:  none Antimicrobials:  Anti-infectives (From admission, onward)    Start     Dose/Rate Route Frequency Ordered Stop   09/14/21 2330  ceFEPIme (MAXIPIME) 2 g in sodium chloride 0.9 % 100 mL IVPB        2 g 200 mL/hr over 30 Minutes Intravenous  Once 09/14/21 2329 09/15/21 0027   09/14/21 2330  vancomycin (VANCOCIN) IVPB 1000 mg/200 mL premix        1,000 mg 200 mL/hr over 60 Minutes Intravenous  Once 09/14/21 2329 09/15/21 0330       Objective   Vitals:   09/17/21 1537 09/17/21 1714 09/17/21 2008 09/18/21 0521  BP:  (!) 159/93 (!) 156/111 (!) 148/119  Pulse:  (!) 105 (!) 105 (!) 104  Resp:  18 19   Temp: 98.6 F (37 C) 98.7 F (37.1 C) 98.7 F (37.1 C) 98.3 F (36.8 C)  TempSrc: Axillary Axillary    SpO2:  95% 92% 94%  Weight:      Height:        Intake/Output Summary (Last 24 hours) at 09/18/2021 0718 Last data filed at 09/18/2021 0300 Gross per 24 hour  Intake 3089.55 ml  Output --  Net 3089.55 ml    Filed Weights   09/14/21 2045  Weight: 70 kg    Patient BMI: Body mass index is 25.68 kg/m.   Physical Exam: General: awake, alert, mild distress HEENT: atraumatic, clear conjunctiva, anicteric sclera, moist mucus membranes Respiratory: normal respiratory effort. Clear lung fields. stertor Cardiovascular: normal S1/S2,  RRR, no JVD, quick capillary refill  Gastrointestinal: soft, NT, ND, no HSM felt Nervous: alert. Moving all extremities. Unable to follow commands. Able to tell me her name repetitively and not appropriately. No other conversation Extremities: moves all equally, no edema, normal tone. Skin: ulceration on right upper chest Psychiatry: anxious mood  Labs   I have personally reviewed following labs and imaging studies Admission on 09/14/2021  Component Date Value  Ref Range Status   Glucose-Capillary 09/14/2021 194 (H)  70 - 99 mg/dL Final   WBC 09/14/2021 6.1  4.0 - 10.5 K/uL Final   RBC 09/14/2021 3.12 (L)  3.87 - 5.11 MIL/uL Final   Hemoglobin 09/14/2021 9.2 (L)  12.0 - 15.0 g/dL Final   HCT 09/14/2021 29.2 (L)  36.0 - 46.0 % Final   MCV 09/14/2021 93.6  80.0 - 100.0 fL Final   MCH 09/14/2021 29.5  26.0 - 34.0 pg Final   MCHC 09/14/2021 31.5  30.0 - 36.0 g/dL Final   RDW 09/14/2021 19.0 (H)  11.5 - 15.5 % Final   Platelets 09/14/2021 201  150 - 400 K/uL Final   nRBC 09/14/2021 0.0  0.0 - 0.2 % Final   Neutrophils Relative % 09/14/2021 83  % Final   Neutro Abs 09/14/2021 5.0  1.7 - 7.7 K/uL Final   Lymphocytes Relative 09/14/2021 9  % Final   Lymphs Abs  09/14/2021 0.6 (L)  0.7 - 4.0 K/uL Final   Monocytes Relative 09/14/2021 6  % Final   Monocytes Absolute 09/14/2021 0.4  0.1 - 1.0 K/uL Final   Eosinophils Relative 09/14/2021 0  % Final   Eosinophils Absolute 09/14/2021 0.0  0.0 - 0.5 K/uL Final   Basophils Relative 09/14/2021 1  % Final   Basophils Absolute 09/14/2021 0.0  0.0 - 0.1 K/uL Final   Immature Granulocytes 09/14/2021 1  % Final   Abs Immature Granulocytes 09/14/2021 0.04  0.00 - 0.07 K/uL Final   Sodium 09/14/2021 144  135 - 145 mmol/L Final   Potassium 09/14/2021 4.0  3.5 - 5.1 mmol/L Final   Chloride 09/14/2021 117 (H)  98 - 111 mmol/L Final   CO2 09/14/2021 20 (L)  22 - 32 mmol/L Final   Glucose, Bld 09/14/2021 41 (LL)  70 - 99 mg/dL Final   BUN 09/14/2021 58 (H)  8 - 23 mg/dL Final   Creatinine, Ser 09/14/2021 3.86 (H)  0.44 - 1.00 mg/dL Final   Calcium 09/14/2021 8.4 (L)  8.9 - 10.3 mg/dL Final   GFR, Estimated 09/14/2021 11 (L)  >60 mL/min Final   Anion gap 09/14/2021 7  5 - 15 Final   Troponin I (High Sensitivity) 09/14/2021 34 (H)  <18 ng/L Final   Lactic Acid, Venous 09/14/2021 0.7  0.5 - 1.9 mmol/L Final   Lactic Acid, Venous 09/15/2021 1.5  0.5 - 1.9 mmol/L Final   Specimen Description 09/14/2021 RIGHT ANTECUBITAL    Final   Special Requests 09/14/2021 BOTTLES DRAWN AEROBIC AND ANAEROBIC Blood Culture adequate volume   Final   Culture 09/14/2021    Final                   Value:NO GROWTH 4 DAYS Performed at [facility], [address] [place], [place] [address]    Report Status 09/14/2021 PENDING   Incomplete   Specimen Description 09/14/2021 BLOOD LEFT HAND   Final   Special Requests 09/14/2021 BOTTLES DRAWN AEROBIC AND ANAEROBIC Blood Culture adequate volume   Final   Culture 09/14/2021    Final                   Value:NO GROWTH 4 DAYS Performed at [facility], [address] [place], [place] [address]    Report Status 09/14/2021 PENDING   Incomplete   Color, Urine 09/15/2021 YELLOW (A)  YELLOW Final   APPearance 09/15/2021 CLEAR (A)  CLEAR Final   Specific Gravity, Urine 09/15/2021 1.019  1.005 - 1.030 Final   pH 09/15/2021 5.0  5.0 - 8.0 Final   Glucose, UA 09/15/2021 NEGATIVE  NEGATIVE mg/dL Final   Hgb urine dipstick 09/15/2021 NEGATIVE  NEGATIVE Final   Bilirubin Urine 09/15/2021 NEGATIVE  NEGATIVE Final   Ketones, ur 09/15/2021 NEGATIVE  NEGATIVE mg/dL Final   Protein, ur 09/15/2021 NEGATIVE  NEGATIVE mg/dL Final   Nitrite 09/15/2021 NEGATIVE  NEGATIVE Final   Leukocytes,Ua 09/15/2021 NEGATIVE  NEGATIVE Final   RBC / HPF 09/15/2021 0-5  0 - 5 RBC/hpf Final   WBC, UA 09/15/2021 0-5  0 - 5 WBC/hpf Final   Bacteria, UA 09/15/2021 RARE (A)  NONE SEEN Final   Squamous Epithelial / LPF 09/15/2021 0-5  0 - 5 Final   Mucus 09/15/2021 PRESENT   Final   SARS Coronavirus 2 by RT PCR 09/14/2021 NEGATIVE  NEGATIVE Final   Influenza A by PCR 09/14/2021 NEGATIVE  NEGATIVE Final  Influenza B by PCR 09/14/2021 NEGATIVE  NEGATIVE Final   Glucose-Capillary 09/14/2021 31 (LL)  70 - 99 mg/dL Final   Comment 1 09/14/2021 Repeat Test   Final   Glucose-Capillary 09/14/2021 74  70 - 99 mg/dL Final   Troponin I (High Sensitivity) 09/14/2021 32 (H)  <18 ng/L Final   Glucose-Capillary  09/14/2021 152 (H)  70 - 99 mg/dL Final   Sodium 09/15/2021 139  135 - 145 mmol/L Final   Potassium 09/15/2021 4.2  3.5 - 5.1 mmol/L Final   Chloride 09/15/2021 114 (H)  98 - 111 mmol/L Final   CO2 09/15/2021 18 (L)  22 - 32 mmol/L Final   Glucose, Bld 09/15/2021 287 (H)  70 - 99 mg/dL Final   BUN 09/15/2021 51 (H)  8 - 23 mg/dL Final   Creatinine, Ser 09/15/2021 3.25 (H)  0.44 - 1.00 mg/dL Final   Calcium 09/15/2021 7.9 (L)  8.9 - 10.3 mg/dL Final   GFR, Estimated 09/15/2021 13 (L)  >60 mL/min Final   Anion gap 09/15/2021 7  5 - 15 Final   WBC 09/15/2021 6.4  4.0 - 10.5 K/uL Final   RBC 09/15/2021 2.95 (L)  3.87 - 5.11 MIL/uL Final   Hemoglobin 09/15/2021 8.3 (L)  12.0 - 15.0 g/dL Final   HCT 09/15/2021 27.5 (L)  36.0 - 46.0 % Final   MCV 09/15/2021 93.2  80.0 - 100.0 fL Final   MCH 09/15/2021 28.1  26.0 - 34.0 pg Final   MCHC 09/15/2021 30.2  30.0 - 36.0 g/dL Final   RDW 09/15/2021 19.0 (H)  11.5 - 15.5 % Final   Platelets 09/15/2021 195  150 - 400 K/uL Final   nRBC 09/15/2021 0.0  0.0 - 0.2 % Final   Glucose-Capillary 09/15/2021 205 (H)  70 - 99 mg/dL Final   Glucose-Capillary 09/15/2021 137 (H)  70 - 99 mg/dL Final   Glucose-Capillary 09/15/2021 133 (H)  70 - 99 mg/dL Final   Glucose-Capillary 09/15/2021 93  70 - 99 mg/dL Final   Comment 1 09/15/2021 Notify RN   Final   Sodium 09/15/2021 142  135 - 145 mmol/L Final   Potassium 09/15/2021 4.0  3.5 - 5.1 mmol/L Final   Chloride 09/15/2021 117 (H)  98 - 111 mmol/L Final   CO2 09/15/2021 18 (L)  22 - 32 mmol/L Final   Glucose, Bld 09/15/2021 204 (H)  70 - 99 mg/dL Final   BUN 09/15/2021 46 (H)  8 - 23 mg/dL Final   Creatinine, Ser 09/15/2021 2.83 (H)  0.44 - 1.00 mg/dL Final   Calcium 09/15/2021 7.5 (L)  8.9 - 10.3 mg/dL Final   GFR, Estimated 09/15/2021 15 (L)  >60 mL/min Final   Anion gap 09/15/2021 7  5 - 15 Final   TSH 09/15/2021 1.486  0.350 - 4.500 uIU/mL Final   Sodium 09/16/2021 146 (H)  135 - 145 mmol/L Final    Potassium 09/16/2021 3.9  3.5 - 5.1 mmol/L Final   Chloride 09/16/2021 115 (H)  98 - 111 mmol/L Final   CO2 09/16/2021 24  22 - 32 mmol/L Final   Glucose, Bld 09/16/2021 94  70 - 99 mg/dL Final   BUN 09/16/2021 42 (H)  8 - 23 mg/dL Final   Creatinine, Ser 09/16/2021 2.49 (H)  0.44 - 1.00 mg/dL Final   Calcium 09/16/2021 7.8 (L)  8.9 - 10.3 mg/dL Final   GFR, Estimated 09/16/2021 18 (L)  >60 mL/min Final   Anion gap 09/16/2021 7  5 - 15 Final   Glucose-Capillary 09/15/2021 195 (H)  70 - 99 mg/dL Final   Procalcitonin 09/15/2021 0.12  ng/mL Final   Procalcitonin 09/16/2021 0.18  ng/mL Final   CRP 09/15/2021 2.6 (H)  <1.0 mg/dL Final   Adenovirus 09/15/2021 NOT DETECTED  NOT DETECTED Final   Coronavirus 229E 09/15/2021 NOT DETECTED  NOT DETECTED Final   Coronavirus HKU1 09/15/2021 NOT DETECTED  NOT DETECTED Final   Coronavirus NL63 09/15/2021 NOT DETECTED  NOT DETECTED Final   Coronavirus OC43 09/15/2021 NOT DETECTED  NOT DETECTED Final   Metapneumovirus 09/15/2021 NOT DETECTED  NOT DETECTED Final   Rhinovirus / Enterovirus 09/15/2021 NOT DETECTED  NOT DETECTED Final   Influenza A 09/15/2021 NOT DETECTED  NOT DETECTED Final   Influenza B 09/15/2021 NOT DETECTED  NOT DETECTED Final   Parainfluenza Virus 1 09/15/2021 NOT DETECTED  NOT DETECTED Final   Parainfluenza Virus 2 09/15/2021 NOT DETECTED  NOT DETECTED Final   Parainfluenza Virus 3 09/15/2021 NOT DETECTED  NOT DETECTED Final   Parainfluenza Virus 4 09/15/2021 NOT DETECTED  NOT DETECTED Final   Respiratory Syncytial Virus 09/15/2021 NOT DETECTED  NOT DETECTED Final   Bordetella pertussis 09/15/2021 NOT DETECTED  NOT DETECTED Final   Bordetella Parapertussis 09/15/2021 NOT DETECTED  NOT DETECTED Final   Chlamydophila pneumoniae 09/15/2021 NOT DETECTED  NOT DETECTED Final   Mycoplasma pneumoniae 09/15/2021 NOT DETECTED  NOT DETECTED Final   Glucose-Capillary 09/15/2021 132 (H)  70 - 99 mg/dL Final   Glucose-Capillary 09/15/2021 98  70  - 99 mg/dL Final   Glucose-Capillary 09/16/2021 93  70 - 99 mg/dL Final   Glucose-Capillary 09/16/2021 86  70 - 99 mg/dL Final   Sodium 09/17/2021 144  135 - 145 mmol/L Final   Potassium 09/17/2021 3.9  3.5 - 5.1 mmol/L Final   Chloride 09/17/2021 111  98 - 111 mmol/L Final   CO2 09/17/2021 26  22 - 32 mmol/L Final   Glucose, Bld 09/17/2021 161 (H)  70 - 99 mg/dL Final   BUN 09/17/2021 38 (H)  8 - 23 mg/dL Final   Creatinine, Ser 09/17/2021 2.10 (H)  0.44 - 1.00 mg/dL Final   Calcium 09/17/2021 7.8 (L)  8.9 - 10.3 mg/dL Final   GFR, Estimated 09/17/2021 22 (L)  >60 mL/min Final   Anion gap 09/17/2021 7  5 - 15 Final   Glucose-Capillary 09/16/2021 92  70 - 99 mg/dL Final   Glucose-Capillary 09/16/2021 100 (H)  70 - 99 mg/dL Final   Glucose-Capillary 09/17/2021 111 (H)  70 - 99 mg/dL Final   Glucose-Capillary 09/17/2021 147 (H)  70 - 99 mg/dL Final   WBC 09/17/2021 6.5  4.0 - 10.5 K/uL Final   RBC 09/17/2021 3.06 (L)  3.87 - 5.11 MIL/uL Final   Hemoglobin 09/17/2021 8.8 (L)  12.0 - 15.0 g/dL Final   HCT 09/17/2021 27.5 (L)  36.0 - 46.0 % Final   MCV 09/17/2021 89.9  80.0 - 100.0 fL Final   MCH 09/17/2021 28.8  26.0 - 34.0 pg Final   MCHC 09/17/2021 32.0  30.0 - 36.0 g/dL Final   RDW 09/17/2021 19.4 (H)  11.5 - 15.5 % Final   Platelets 09/17/2021 178  150 - 400 K/uL Final   nRBC 09/17/2021 0.0  0.0 - 0.2 % Final   Total Protein 09/17/2021 5.8 (L)  6.5 - 8.1 g/dL Final   Albumin 09/17/2021 2.5 (L)  3.5 - 5.0 g/dL Final   AST 09/17/2021 53 (H)  15 -  41 U/L Final   ALT 09/17/2021 29  0 - 44 U/L Final   Alkaline Phosphatase 09/17/2021 254 (H)  38 - 126 U/L Final   Total Bilirubin 09/17/2021 0.7  0.3 - 1.2 mg/dL Final   Bilirubin, Direct 09/17/2021 0.2  0.0 - 0.2 mg/dL Final   Indirect Bilirubin 09/17/2021 0.5  0.3 - 0.9 mg/dL Final   Ammonia 09/17/2021 16  9 - 35 umol/L Final   Vitamin B-12 09/17/2021 1,377 (H)  180 - 914 pg/mL Final   Sodium 09/18/2021 148 (H)  135 - 145 mmol/L Final    Potassium 09/18/2021 3.6  3.5 - 5.1 mmol/L Final   Chloride 09/18/2021 116 (H)  98 - 111 mmol/L Final   CO2 09/18/2021 26  22 - 32 mmol/L Final   Glucose, Bld 09/18/2021 174 (H)  70 - 99 mg/dL Final   BUN 09/18/2021 35 (H)  8 - 23 mg/dL Final   Creatinine, Ser 09/18/2021 1.84 (H)  0.44 - 1.00 mg/dL Final   Calcium 09/18/2021 8.0 (L)  8.9 - 10.3 mg/dL Final   GFR, Estimated 09/18/2021 26 (L)  >60 mL/min Final   Anion gap 09/18/2021 6  5 - 15 Final   WBC 09/18/2021 5.8  4.0 - 10.5 K/uL Final   RBC 09/18/2021 2.99 (L)  3.87 - 5.11 MIL/uL Final   Hemoglobin 09/18/2021 8.6 (L)  12.0 - 15.0 g/dL Final   HCT 09/18/2021 27.3 (L)  36.0 - 46.0 % Final   MCV 09/18/2021 91.3  80.0 - 100.0 fL Final   MCH 09/18/2021 28.8  26.0 - 34.0 pg Final   MCHC 09/18/2021 31.5  30.0 - 36.0 g/dL Final   RDW 09/18/2021 19.5 (H)  11.5 - 15.5 % Final   Platelets 09/18/2021 178  150 - 400 K/uL Final   nRBC 09/18/2021 0.0  0.0 - 0.2 % Final   Glucose-Capillary 09/17/2021 179 (H)  70 - 99 mg/dL Final   Glucose-Capillary 09/17/2021 121 (H)  70 - 99 mg/dL Final   Glucose-Capillary 09/17/2021 131 (H)  70 - 99 mg/dL Final   Glucose-Capillary 09/18/2021 92  70 - 99 mg/dL Final   Glucose-Capillary 09/18/2021 178 (H)  70 - 99 mg/dL Final    Imaging Studies  CT HEAD WO CONTRAST (5MM)  Result Date: 09/17/2021 CLINICAL DATA:  Altered mental status EXAM: CT HEAD WITHOUT CONTRAST TECHNIQUE: Contiguous axial images were obtained from the base of the skull through the vertex without intravenous contrast. COMPARISON:  09/14/2021 FINDINGS: Brain: No acute intracranial findings are seen. There is no demonstrable intracerebral hemorrhage. Ventricles are unremarkable. Cortical sulci are prominent. There is decreased density in subcortical and periventricular white matter. Minimal calcifications are seen in the basal ganglia. Vascular: Unremarkable. Skull: No fracture is seen. There is interval decrease in subcutaneous  contusion/hematoma in the left frontal scalp Sinuses/Orbits: There is mild mucosal thickening in the ethmoid and right side of sphenoid sinuses. Other: None IMPRESSION: There are no signs of bleeding within the cranium. There is no focal mass effect. Atrophy. Small-vessel disease. Electronically Signed   By: Elmer Picker M.D.   On: 09/17/2021 11:13   Medications   Scheduled Meds:  acetaminophen  1,000 mg Oral Once   collagenase  1 application Topical Daily   fluticasone  2 spray Each Nare Daily    HYDROmorphone (DILAUDID) injection  0.5 mg Intravenous Q6H   insulin aspart  0-9 Units Subcutaneous Q4H   levothyroxine  100 mcg Oral Q0600   lidocaine  1 patch  Transdermal Q24H   mupirocin cream  1 application Topical Daily   pantoprazole  20 mg Oral Daily   QUEtiapine  25 mg Oral QHS   tiotropium  18 mcg Inhalation Daily   No recently discontinued medications to reconcile  LOS: 3 days   Time spent: >40min  Rheagan Nayak L Zakyla Tonche, DO [facility] 09/18/2021, 7:18 AM   Please refer to amion to contact the [facility] Attending or Consulting provider for this pt  [email or web address] Available by Epic secure chat 7AM-7PM. If 7PM-7AM, please contact night-coverage

## 2021-09-18 NOTE — Progress Notes (Signed)
   09/18/21 1626  Assess: MEWS Score  BP (!) 160/86  Pulse Rate (!) 116  Resp 20  SpO2 90 %  O2 Device Room Air  Assess: MEWS Score  MEWS Temp 0  MEWS Systolic 0  MEWS Pulse 2  MEWS RR 0  MEWS LOC 0  MEWS Score 2  MEWS Score Color Yellow  Assess: if the MEWS score is Yellow or Red  Were vital signs taken at a resting state? Yes  Focused Assessment No change from prior assessment  Does the patient meet 2 or more of the SIRS criteria? No  MEWS guidelines implemented *See Row Information* Yes  Treat  MEWS Interventions Consulted Respiratory Therapy  Take Vital Signs  Increase Vital Sign Frequency  Yellow: Q 2hr X 2 then Q 4hr X 2, if remains yellow, continue Q 4hrs  Escalate  MEWS: Escalate Yellow: discuss with charge nurse/RN and consider discussing with provider and RRT  Notify: Charge Nurse/RN  Name of Charge Nurse/RN Notified Stephanie, RN  Date Charge Nurse/RN Notified 09/18/21  Time Charge Nurse/RN Notified 13  Document  Patient Outcome Stabilized after interventions  Progress note created (see row info) Yes  Assess: SIRS CRITERIA  SIRS Temperature  0  SIRS Pulse 1  SIRS Respirations  0  SIRS WBC 0  SIRS Score Sum  1

## 2021-09-18 NOTE — TOC Initial Note (Signed)
Transition of Care [facility]) - Initial/Assessment Note    Patient Details  Name: Denise [name] MRN: [id number] Date of Birth: June 22, 1930  Transition of Care [facility]) CM/SW Contact:    Magnus Ivan, LCSW Phone Number: 09/18/2021, 11:53 AM  Clinical Narrative:           Denise [name] with [facility]ise [name] [facility] who states patient is from [facility] where she was receiving hospice services. Spoke with patient's daughter Denise [name] to confirm the same. Denise [name] stated patient has been at [facility] less than a year. Denise [name] stated she thinks the plan is for patient to return to [facility] with hospice when medically ready.  Denise [name] was asking for a medical update and if patient is moving to ICU, updated MD & RN, asked RN to follow up.         Expected Discharge Plan: Long Term Nursing Home Barriers to Discharge: Continued Medical Work up   Patient Goals and CMS Choice Patient states their goals for this hospitalization and ongoing recovery are:: TBD CMS Medicare.gov Compare Post Acute Care list provided to:: Patient Represenative (must comment) Choice offered to / list presented to : Adult Children  Expected Discharge Plan and Services Expected Discharge Plan: [place]       Living arrangements for the past 2 months: [place] (LTC with hospice)                                      Prior Living Arrangements/Services Living arrangements for the past 2 months: [facility] (LTC with hospice) Lives with:: Facility Resident Patient language and need for interpreter reviewed:: Yes Do you feel safe going back to the place where you live?: Yes      Need for Family Participation in Patient Care: Yes (Comment) Care giver support system in place?: Yes (comment) Current home services: DME Criminal Activity/Legal Involvement Pertinent to Current Situation/Hospitalization: No - Comment as needed  Activities of Daily Living Home Assistive Devices/Equipment:  (unknown) ADL Screening  (condition at time of admission) Patient's cognitive ability adequate to safely complete daily activities?: No Is the patient deaf or have difficulty hearing?: No Does the patient have difficulty seeing, even when wearing glasses/contacts?: No Does the patient have difficulty concentrating, remembering, or making decisions?: Yes Patient able to express need for assistance with ADLs?: No Does the patient have difficulty dressing or bathing?: Yes Independently performs ADLs?: No Communication: Dependent Is this a change from baseline?: Change from baseline, expected to last >3 days Dressing (OT): Dependent Is this a change from baseline?: Change from baseline, expected to last >3 days Grooming: Dependent Is this a change from baseline?: Change from baseline, expected to last >3 days Feeding: Dependent Is this a change from baseline?: Change from baseline, expected to last >3 days Bathing: Dependent Is this a change from baseline?: Change from baseline, expected to last >3 days Toileting: Dependent Is this a change from baseline?: Change from baseline, expected to last >3days In/Out Bed: Dependent Is this a change from baseline?: Change from baseline, expected to last >3 days Walks in Home: Dependent Is this a change from baseline?: Change from baseline, expected to last >3 days Does the patient have difficulty walking or climbing stairs?: Yes Weakness of Legs: Both Weakness of Arms/Hands: Both  Permission Sought/Granted Permission sought to share information with : Facility Art therapist granted to share information with : Yes, Verbal Permission  Granted (by daughter)     Permission granted to share info w AGENCY: [facility]        Emotional Assessment         Alcohol / Substance Use: Not Applicable Psych Involvement: No (comment)  Admission diagnosis:  Hypoglycemia [E16.2] Hypothermia, initial encounter [T68.XXXA] Sepsis ([place]) [A41.9] Closed fracture of  multiple ribs of both sides, initial encounter [S22.43XA] Patient Active Problem List   Diagnosis Date Noted   Dyspnea    Delirium    Dementia with behavioral disturbance    Acute metabolic encephalopathy    Multiple closed fractures of ribs of both sides    Hypothermia    Septic shock (HCC)    Hypoglycemia 09/15/2021   Sepsis ([place]) 09/15/2021   Closed fracture of left inferior pubic ramus ([place]) 07/19/2021   CKD (chronic kidney disease), stage IV ([place]) 07/19/2021   Essential hypertension 07/19/2021   Insomnia    Acute kidney injury superimposed on CKD ([place])    Acute sinusitis    Weakness    Left arm swelling    Intramural hematoma of artery of right upper extremity 03/12/2021   Hematoma    Anemia in chronic kidney disease 05/06/2020   Sinus pause 11/30/2019   PAF (paroxysmal atrial fibrillation) ([place]) 11/30/2019   Atrial fibrillation with RVR ([place]) 11/28/2019   ESRD on dialysis ([place]) 04/11/2017   Hyperlipidemia 01/05/2017   Type 2 diabetes mellitus with hyperlipidemia ([place]) 01/05/2017   Chronic kidney disease 01/05/2017   Intertrochanteric fracture of right hip ([place]) 08/03/2015   Hypoglycemia secondary to sulfonylurea 07/29/2015   PCP:  Rica Koyanagi, MD Pharmacy:   [facility], [address] [place] [address] Phone: [phone] Fax: [phone]  [place], [place] [address] [place] [address] Phone: [phone] Fax: [phone]     Social Determinants of Health (SDOH) Interventions    Readmission Risk Interventions Readmission Risk Prevention Plan 09/18/2021  Transportation Screening Complete  PCP or Specialist Appt within 3-5 Days Complete  HRI or Home Care Consult Complete  Social Work Consult for [place] Planning/Counseling Complete  Palliative Care Screening Complete  Medication Review Press photographer) Complete  Some recent data might be hidden

## 2021-09-19 LAB — CBC
HCT: 29.2 % — ABNORMAL LOW (ref 36.0–46.0)
Hemoglobin: 9.1 g/dL — ABNORMAL LOW (ref 12.0–15.0)
MCH: 28.6 pg (ref 26.0–34.0)
MCHC: 31.2 g/dL (ref 30.0–36.0)
MCV: 91.8 fL (ref 80.0–100.0)
Platelets: 193 10*3/uL (ref 150–400)
RBC: 3.18 MIL/uL — ABNORMAL LOW (ref 3.87–5.11)
RDW: 19.4 % — ABNORMAL HIGH (ref 11.5–15.5)
WBC: 6.4 10*3/uL (ref 4.0–10.5)
nRBC: 0.5 % — ABNORMAL HIGH (ref 0.0–0.2)

## 2021-09-19 LAB — BASIC METABOLIC PANEL
Anion gap: 7 (ref 5–15)
BUN: 41 mg/dL — ABNORMAL HIGH (ref 8–23)
CO2: 25 mmol/L (ref 22–32)
Calcium: 8.3 mg/dL — ABNORMAL LOW (ref 8.9–10.3)
Chloride: 116 mmol/L — ABNORMAL HIGH (ref 98–111)
Creatinine, Ser: 2.26 mg/dL — ABNORMAL HIGH (ref 0.44–1.00)
GFR, Estimated: 20 mL/min — ABNORMAL LOW (ref >60)
Glucose, Bld: 132 mg/dL — ABNORMAL HIGH (ref 70–99)
Potassium: 4.3 mmol/L (ref 3.5–5.1)
Sodium: 148 mmol/L — ABNORMAL HIGH (ref 135–145)

## 2021-09-19 LAB — CULTURE, BLOOD (ROUTINE X 2)
Culture: NO GROWTH
Culture: NO GROWTH
Special Requests: ADEQUATE
Special Requests: ADEQUATE

## 2021-09-19 LAB — GLUCOSE, CAPILLARY
Glucose-Capillary: 114 mg/dL — ABNORMAL HIGH (ref 70–99)
Glucose-Capillary: 115 mg/dL — ABNORMAL HIGH (ref 70–99)
Glucose-Capillary: 118 mg/dL — ABNORMAL HIGH (ref 70–99)
Glucose-Capillary: 123 mg/dL — ABNORMAL HIGH (ref 70–99)
Glucose-Capillary: 126 mg/dL — ABNORMAL HIGH (ref 70–99)
Glucose-Capillary: 133 mg/dL — ABNORMAL HIGH (ref 70–99)

## 2021-09-19 MED ORDER — CHLORHEXIDINE GLUCONATE CLOTH 2 % EX PADS
6.0000 | MEDICATED_PAD | Freq: Every day | CUTANEOUS | Status: DC
  Administered 2021-09-19 – 2021-09-23 (×4): 6 via TOPICAL

## 2021-09-19 MED ORDER — METOPROLOL TARTRATE 5 MG/5ML IV SOLN
10.0000 mg | Freq: Three times a day (TID) | INTRAVENOUS | Status: DC
Start: 2021-09-19 — End: 2021-09-20
  Administered 2021-09-19 – 2021-09-20 (×3): 10 mg via INTRAVENOUS
  Filled 2021-09-19 (×3): qty 10

## 2021-09-19 MED ORDER — SODIUM CHLORIDE 0.9 % IV SOLN
2.0000 g | Freq: Every day | INTRAVENOUS | Status: DC
  Administered 2021-09-19: 12:00:00 2 g via INTRAVENOUS
  Filled 2021-09-19 (×2): qty 20

## 2021-09-19 MED ORDER — SODIUM CHLORIDE 0.9 % IV SOLN
500.0000 mg | Freq: Every day | INTRAVENOUS | Status: DC
  Administered 2021-09-19: 12:00:00 500 mg via INTRAVENOUS
  Filled 2021-09-19 (×2): qty 500

## 2021-09-19 MED ORDER — FUROSEMIDE 10 MG/ML IJ SOLN
40.0000 mg | Freq: Once | INTRAMUSCULAR | Status: AC
  Administered 2021-09-19: 08:00:00 40 mg via INTRAVENOUS
  Filled 2021-09-19: qty 4

## 2021-09-19 MED ORDER — HEPARIN SODIUM (PORCINE) 5000 UNIT/ML IJ SOLN
5000.0000 [IU] | Freq: Three times a day (TID) | INTRAMUSCULAR | Status: DC
  Administered 2021-09-19 – 2021-09-20 (×3): 5000 [IU] via SUBCUTANEOUS
  Filled 2021-09-19 (×3): qty 1

## 2021-09-19 NOTE — Plan of Care (Signed)
  Problem: Clinical Measurements: Goal: Diagnostic test results will improve Outcome: Not Progressing Goal: Respiratory complications will improve Outcome: Not Progressing   Problem: Activity: Goal: Risk for activity intolerance will decrease Outcome: Not Progressing   Problem: Nutrition: Goal: Adequate nutrition will be maintained Outcome: Not Progressing

## 2021-09-19 NOTE — Progress Notes (Signed)
PROGRESS NOTE  Denise [name]    DOB: [birth year], 87 y.o.  OMB:[id number]  PCP: Denise Koyanagi, MD   Code Status: DNR   DOA: 09/14/2021   LOS: 4  Brief Narrative of Current Hospitalization  Denise [name] is a 87 y.o. female with a PMH significant for A. fib, CKD, COPD, type II DM, GERD, HTN, hypothyroidism. They presented from ALF to the ED on 09/14/2021 with altered mental status acutely.  She received chest compressions at her ALF which resulted in multiple fractured ribs.  EMS arrived and evaluated that she had a glucose level of 30 and provided her with an amp of D50.  In the ED, it was found that they had a glucose of 78 and minimally responsive. They were treated with IV fluids and glucose.  Patient was admitted to medicine service for further workup and management of hypoglycemia and AMS as outlined in detail below. Corrected metabolic acidosis, hypotension, hypoglycemic event with little improvement in mental status. Overall she is less agitated/anxious but very minimal alertness.   09/19/21 -stable  Assessment & Plan  Principal Problem:   Hypoglycemia Active Problems:   Sepsis ([place])   Dyspnea   Delirium  Hypoglycemic event- resolved.  Type II DM- NPO - continue sSSI and frequent CBGs  Acute metabolic encephalitis-  Vascular shock- Criteria for shock have resolved. No acute findings on head imaging, except for frontal scalp hematoma. No source on urine studies or chest/abdominal imaging. Has been off levophed since about 0800 11/24 and maintaining Bps which are now significantly elevated. BxCx NGTD.  - consulted neurology, appreciate recs  - EEG- no seizure activity, typical for metabolic encephalopathy - palliative care consult for Denise [name] discussion as well as confirm code status - SLP eval- NPO - PRN analgesia from multiple rib fractures as well as scheduled tylenol/dilaudid since patient is unable to verbalize requests. - f/u cultures- NGTD - discontinued benadryl- do  not restart.  HTN- elevated since she has been intravascularly repleted. reordering her home meds with exception of ACE/ARB due to kidney injury.  - restart diltiazem, metoprolol when able to take PO - IV metoprolol scheduled until able to take PO  Atelectasis, pulmonary vascular congestion  h/o COPD- chest xray 11/26 showing congestion and possible PNA which was expected given her shallow breathing with rib fractures. Her respirations seem more comfortable today. She has no documented desats but is on 2L O2 overnight - wean to room air as tolerated.  - CTX, azithromycin x5 days (11/27- - lasix IV trial today. Place foley and strict monitor I/O - incentive spirometer when more oriented and able to follow commands.   AKI on CKD-Cr 2.83>>1.84>2.26. potentially due to volume overload - lasix trial today - BMP am - strict I/O  Acute rib fractures, healing pelvic fracture-  - analgesia as above - lidocaine patch  Left forearm erythema- essentially resolved. Non-tender to touch  Afib- rate elevated low 100s. No anticoagulation.  - repeat echo when can be cooperative - IV metoprolol scheduled as patient is unable to take PO at this time  Hypothyroidism- TSH normal on admission - continue levothyroxine  Dementia  Anxiety  delirium- [facility] resident. patient has baseline incontinence and disorientation but able to hold conversations with her daughter. Their last conversation was 10/31. Daughter states that her mom has had gradual decline over past several months/years. - continue ativan PRN for anxiety - continue to await palliative interventions  GERD-   DVT prophylaxis:   Diet:  Diet Orders (From  admission, onward)     Start     Ordered   09/16/21 0617  Diet NPO time specified Except for: Sips with Meds  Diet effective now       Question:  Except for  Answer:  Denise [name] with Meds   09/16/21 0616            Subjective 09/19/21    Pt asleep on exam. Does show some recognition  in her face when spoken to but not verbally responsive. Does not show signs of acute distress or pain.   Disposition Plan & Communication  Patient status: Inpatient  Admitted From:  ILF Disposition: Nursing home Anticipated discharge date: TBD  Family Communication: daughter on phone Consults, Procedures, Significant Events  Consultants:  CCM palliative  Procedures/significant events:  none Antimicrobials:  Anti-infectives (From admission, onward)    Start     Dose/Rate Route Frequency Ordered Stop   09/14/21 2330  ceFEPIme (MAXIPIME) 2 g in sodium chloride 0.9 % 100 mL IVPB        2 g 200 mL/hr over 30 Minutes Intravenous  Once 09/14/21 2329 09/15/21 0027   09/14/21 2330  vancomycin (VANCOCIN) IVPB 1000 mg/200 mL premix        1,000 mg 200 mL/hr over 60 Minutes Intravenous  Once 09/14/21 2329 09/15/21 0330       Objective   Vitals:   09/18/21 1813 09/18/21 2132 09/19/21 0008 09/19/21 0428  BP: (!) 179/97 (!) 162/95 (!) 157/108 (!) 158/94  Pulse: (!) 115 (!) 106 (!) 108 (!) 105  Resp: (!) 22 (!) 22  20  Temp: 98.3 F (36.8 C) 98.2 F (36.8 C) 98.2 F (36.8 C) 98.4 F (36.9 C)  TempSrc: Axillary     SpO2: 94% 95% 92% 94%  Weight:      Height:        Intake/Output Summary (Last 24 hours) at 09/19/2021 0650 Last data filed at 09/18/2021 1850 Gross per 24 hour  Intake 272 ml  Output 50 ml  Net 222 ml    Filed Weights   09/14/21 2045  Weight: 70 kg    Patient BMI: Body mass index is 25.68 kg/m.   Physical Exam: General: awake, alert, mild distress HEENT: atraumatic, clear conjunctiva, anicteric sclera, moist mucus membranes Respiratory: normal respiratory effort. Clear lung fields. stertor Cardiovascular: normal S1/S2,  RRR, no JVD, quick capillary refill  Gastrointestinal: soft, NT, ND, no HSM felt Nervous: alert. Moving all extremities. Unable to follow commands. Able to tell me her name repetitively and not appropriately. No other  conversation Extremities: moves all equally, no edema, normal tone. Skin: ulceration on right upper chest Psychiatry: anxious mood  Labs   I have personally reviewed following labs and imaging studies CBC    Component Value Date/Time   WBC 6.4 09/19/2021 0341   RBC 3.18 (L) 09/19/2021 0341   HGB 9.1 (L) 09/19/2021 0341   HGB 11.5 (L) 11/05/2013 0437   HCT 29.2 (L) 09/19/2021 0341   HCT 34.5 (L) 11/05/2013 0437   PLT 193 09/19/2021 0341   PLT 186 11/05/2013 0437   MCV 91.8 09/19/2021 0341   MCV 87 11/05/2013 0437   MCH 28.6 09/19/2021 0341   MCHC 31.2 09/19/2021 0341   RDW 19.4 (H) 09/19/2021 0341   RDW 14.2 11/05/2013 0437   LYMPHSABS 0.6 (L) 09/14/2021 2133   LYMPHSABS 0.5 (L) 11/05/2013 0437   MONOABS 0.4 09/14/2021 2133   MONOABS 0.2 11/05/2013 0437   EOSABS 0.0 09/14/2021 2133  EOSABS 0.0 11/05/2013 0437   BASOSABS 0.0 09/14/2021 2133   BASOSABS 0.0 11/05/2013 0437   BMP Latest Ref Rng & Units 09/19/2021 09/18/2021 09/17/2021  Glucose 70 - 99 mg/dL 132(H) 174(H) 161(H)  BUN 8 - 23 mg/dL 41(H) 35(H) 38(H)  Creatinine 0.44 - 1.00 mg/dL 2.26(H) 1.84(H) 2.10(H)  Sodium 135 - 145 mmol/L 148(H) 148(H) 144  Potassium 3.5 - 5.1 mmol/L 4.3 3.6 3.9  Chloride 98 - 111 mmol/L 116(H) 116(H) 111  CO2 22 - 32 mmol/L 25 26 26   Calcium 8.9 - 10.3 mg/dL 8.3(L) 8.0(L) 7.8(L)   Imaging Studies  DG Chest Port 1 View  Result Date: 09/18/2021 CLINICAL DATA:  They presented from ALF to the ED on 09/14/2021 with altered mental status acutely. She received chest compressions at her ALF which resulted in multiple fractured ribs. EMS arrived and evaluated that she had a glucose level of 30 and provided her with an amp of D50. In the ED, it was found that they had a glucose of 78 and minimally responsive. EXAM: PORTABLE CHEST 1 VIEW COMPARISON:  09/14/2021 FINDINGS: Cardiac silhouette is normal in size. Stable left anterior chest wall dual lead pacemaker. No mediastinal or hilar masses. Lungs  are hyperexpanded. There is opacity at both lung bases, left greater than right, on the left obscuring the hemidiaphragm. Lungs also show vascular congestion, but are otherwise clear. No pneumothorax.  Possible small effusions. No visualized fracture. Rib fractures noted on the prior abdomen and pelvis CT from 09/14/2021 are not visualized. IMPRESSION: 1. Left greater than right lung base opacities consistent with atelectasis with suspected small associated effusions. Pneumonia is not excluded. 2. Vascular congestion with no overt pulmonary edema. 3. No pneumothorax. Electronically Signed   By: Lajean Manes M.D.   On: 09/18/2021 12:00   Medications   Scheduled Meds:  acetaminophen  1,000 mg Oral Once   collagenase  1 application Topical Daily   diltiazem  120 mg Oral Daily   fluticasone  2 spray Each Nare Daily   glycopyrrolate  1 mg Oral BID   guaiFENesin  600 mg Oral BID   insulin aspart  0-9 Units Subcutaneous Q4H   levothyroxine  100 mcg Oral Q0600   lidocaine  1 patch Transdermal Q24H   metoprolol tartrate  5 mg Intravenous Q8H   mupirocin cream  1 application Topical Daily   pantoprazole  20 mg Oral Daily   QUEtiapine  75 mg Oral QHS   tiotropium  18 mcg Inhalation Daily   No recently discontinued medications to reconcile  LOS: 4 days   Time spent: >29min  Richarda Osmond, DO [facility] 09/19/2021, 6:50 AM   Please refer to amion to contact the [facility] Attending or Consulting provider for this pt  [email or web address] Available by Epic secure chat 7AM-7PM. If 7PM-7AM, please contact night-coverage

## 2021-09-19 NOTE — Progress Notes (Signed)
It shows on the work list that orthostatic vital signs are requested. The patient is unable to sit or stand at this time.

## 2021-09-19 NOTE — Evaluation (Signed)
Clinical/Bedside Swallow Evaluation Patient Details  Name: Denise [name] MRN: [id number] Date of Birth: 12/09/1929  Today's Date: 09/19/2021 Time: SLP Start Time (ACUTE ONLY): 0828 SLP Stop Time (ACUTE ONLY): 9381 SLP Time Calculation (min) (ACUTE ONLY): 22 min  Past Medical History:  Past Medical History:  Diagnosis Date   A-fib ([place])    Arthritis    Cataract    Chronic kidney disease    COPD (chronic obstructive pulmonary disease) ([place])    Diabetes mellitus without complication ([place])    Emphysema of lung ([place])    GERD (gastroesophageal reflux disease)    Hypertension    Hypothyroidism    Past Surgical History:  Past Surgical History:  Procedure Laterality Date   AV FISTULA PLACEMENT Right 11/18/2015   Procedure: ARTERIOVENOUS (AV) FISTULA CREATION;  Surgeon: Algernon Huxley, MD;  Location: [facility];  Service: Vascular;  Laterality: Right;   CHOLECYSTECTOMY     GALLBLADDER SURGERY     HIP ARTHROPLASTY Right 07/31/2015   Procedure: ARTHROPLASTY BIPOLAR HIP (HEMIARTHROPLASTY);  Surgeon: Corky Mull, MD;  Location: [facility];  Service: Orthopedics;  Laterality: Right;   LIGATION OF ARTERIOVENOUS  FISTULA Right 03/14/2021   Procedure: LIGATION OF ARTERIOVENOUS  FISTULA  AND EVACUATION OF HEMATOMA;  Surgeon: Conrad [place], MD;  Location: [facility];  Service: Vascular;  Laterality: Right;   PACEMAKER IMPLANT N/A 11/29/2019   Procedure: PACEMAKER IMPLANT;  Surgeon: Constance Haw, MD;  Location: [place] [facility];  Service: Cardiovascular;  Laterality: N/A;   TEMPORARY PACEMAKER N/A 11/29/2019   Procedure: TEMPORARY PACEMAKER;  Surgeon: Isaias Cowman, MD;  Location: [place] [facility];  Service: Cardiovascular;  Laterality: N/A;   HPI:  Per 32 H&P "Denise [name] is a 87 y.o. female with medical history significant for atrial fibrillation, CKD, COPD, type diabetes mellitus, GERD, hypertension and hypothyroidism, presented to the ER with acute onset of unresponsiveness  when she was found slumped over a table in her assisted living facility.  She was initially thought to be in cardiac arrest and CPR was started.  By the time EMS arrived however she had a pulse and was found to be hypoglycemic with blood glucose of 30.  She was given dextrose and was still low when she came to the ER and therefore was given an amp of D50 followed by D5W infusion.  She was noted to be hypothermic and was placed on [facility].  [name] reported nausea or vomiting or abdominal pain.  [name] reported fever or chills.  [name] dysuria, oliguria or hematuria or flank pain." CXR 09/18/21 "1. Left greater than right lung base opacities consistent with  atelectasis with suspected small associated effusions. Pneumonia is  not excluded.  2. Vascular congestion with [name] overt pulmonary edema.  3. [name] pneumothorax." Head CT 09/14/21 "1. Midline frontal scalp hematoma. [name] acute intracranial  abnormality. [name] skull fracture.  2. Stable atrophy and chronic small vessel ischemia."    Assessment / Plan / Recommendation  Clinical Impression  Pt seen for clinical swallowing evaluation. Pt alert. Nonverbal. Confusion evident. Unable to follow commands for participation in oral motor examination. Xerostomia and endentulism appreciated.   Based on today's evaluation, pt presents with s/sx at least a moderate oral dysphagia c/b oral holding/delayed A-P transit across trials. Pt with s/sx highly suspected pharyngeal dysphagia including immediate and/or delayed wet cough across all consistencies x pureed (vanilla pudding) and seemingly delayed swallow with trials of ice chips and pudding. Further PO trials deferred due  to severity of pt's clinical s/sx.   Pt is at increased risk for aspiration/aspiration PNA given AMS, cognitive deficits, multiple medical comorbidities (including dementia), and dental status.  At present, a safe oral diet cannot be recommended. Recommend NPO x critical meds crushed in pureed. Prefer non-oral means  of medication administration. Consideration for short term alternate route of nutrition/hydration/medication if in alignment with GOC.   RN made aware of results, recommendations, and SLP POC. RN in agreement. Noted Palliative Consult pending.   SLP to f/u next date for clinical swallowing re-evaluation if in alignment with pt's GOC.  SLP Visit Diagnosis: Dysphagia, oropharyngeal phase (R13.12)    Aspiration Risk  Moderate aspiration risk;Risk for inadequate nutrition/hydration    Diet Recommendation NPO   Medication Administration: Crushed with puree (critical meds crushed; prefer non-oral means) Supervision: Full supervision/cueing for compensatory strategies;Intermittent supervision to cue for compensatory strategies (for critical meds) Compensations: Minimize environmental distractions;Slow rate;Small sips/bites (check for oral clearance; with critical meds) Postural Changes: Seated upright at 90 degrees    Other  Recommendations Recommended Consults:  (Palliative Consult) Oral Care Recommendations: Oral care BID;Oral care before and after PO;Staff/trained caregiver to provide oral care Other Recommendations: Remove water pitcher;Have oral suction available    Recommendations for follow up therapy are one component of a multi-disciplinary discharge planning process, led by the attending physician.  Recommendations may be updated based on patient status, additional functional criteria and insurance authorization.  Follow up Recommendations Skilled nursing-short term rehab (<3 hours/day)      Assistance Recommended at Discharge Frequent or constant Supervision/Assistance  Functional Status Assessment Patient has had a recent decline in their functional status and demonstrates the ability to make significant improvements in function in a reasonable and predictable amount of time.  Frequency and Duration min 2x/week  2 weeks       Prognosis Prognosis for Safe Diet Advancement:  Guarded Barriers to Reach Goals: Cognitive deficits;Severity of deficits      Swallow Study   General Date of Onset: 09/14/21 HPI: Per 30 H&P "Denise [name] is a 87 y.o. female with medical history significant for atrial fibrillation, CKD, COPD, type diabetes mellitus, GERD, hypertension and hypothyroidism, presented to the ER with acute onset of unresponsiveness when she was found slumped over a table in her assisted living facility.  She was initially thought to be in cardiac arrest and CPR was started.  By the time EMS arrived however she had a pulse and was found to be hypoglycemic with blood glucose of 30.  She was given dextrose and was still low when she came to the ER and therefore was given an amp of D50 followed by D5W infusion.  She was noted to be hypothermic and was placed on [facility].  [name] reported nausea or vomiting or abdominal pain.  [name] reported fever or chills.  [name] dysuria, oliguria or hematuria or flank pain." CXR 09/18/21 "1. Left greater than right lung base opacities consistent with  atelectasis with suspected small associated effusions. Pneumonia is  not excluded.  2. Vascular congestion with [name] overt pulmonary edema.  3. [name] pneumothorax." Head CT 09/14/21 "1. Midline frontal scalp hematoma. [name] acute intracranial  abnormality. [name] skull fracture.  2. Stable atrophy and chronic small vessel ischemia." Type of Study: Bedside Swallow Evaluation Previous Swallow Assessment: unknown Diet Prior to this Study: NPO Temperature Spikes Noted: [name] Respiratory Status: Nasal cannula (2L/min) History of Recent Intubation: [name] Behavior/Cognition: Alert;Requires cueing Oral Cavity Assessment: Dry Oral Care Completed by  SLP: Yes Oral Cavity - Dentition: Edentulous Vision:  (did not attempt to self feed) Self-Feeding Abilities: Total assist Patient Positioning: Upright in bed Baseline Vocal Quality:  (nonverbal during session) Volitional Cough: Cognitively unable to  elicit Volitional Swallow: Unable to elicit    Oral/Motor/Sensory Function Overall Oral Motor/Sensory Function:  (unable to assess)   Ice Chips Ice chips: Impaired Presentation: Spoon Oral Phase Impairments: Poor awareness of bolus Oral Phase Functional Implications: Oral holding Pharyngeal Phase Impairments: Suspected delayed Swallow   Thin Liquid Thin Liquid: Impaired Presentation: Spoon;Cup Oral Phase Impairments: Reduced labial seal;Poor awareness of bolus Oral Phase Functional Implications: Right anterior spillage;Oral holding Pharyngeal  Phase Impairments: Cough - Immediate (wet cough) Other Comments: ~1 oz    Nectar Thick Nectar Thick Liquid: Impaired Presentation: Spoon;Cup Oral Phase Impairments: Poor awareness of bolus Oral phase functional implications: Oral holding Pharyngeal Phase Impairments: Cough - Immediate;Cough - Delayed (delayed cough by tsp; wet cough) Other Comments: ~1 oz   Honey Thick Honey Thick Liquid: Not tested   Puree Puree: Impaired Presentation: Spoon Oral Phase Impairments:  (reduced bolus awareness) Oral Phase Functional Implications:  (inconsistent delayed A-P transit) Pharyngeal Phase Impairments: Suspected delayed Swallow Other Comments: ~2 oz   Solid     Solid: Not tested     Cherrie Gauze, M.S., [facility] [phone])  Clearnce Sorrel Mehr [place] 09/19/2021,11:02 AM

## 2021-09-20 ENCOUNTER — Encounter: Payer: Self-pay | Admitting: Family Medicine

## 2021-09-20 DIAGNOSIS — Z515 Encounter for palliative care: Secondary | ICD-10-CM

## 2021-09-20 DIAGNOSIS — E162 Hypoglycemia, unspecified: Secondary | ICD-10-CM

## 2021-09-20 LAB — GLUCOSE, CAPILLARY
Glucose-Capillary: 101 mg/dL — ABNORMAL HIGH (ref 70–99)
Glucose-Capillary: 103 mg/dL — ABNORMAL HIGH (ref 70–99)
Glucose-Capillary: 116 mg/dL — ABNORMAL HIGH (ref 70–99)

## 2021-09-20 LAB — CBC
HCT: 30.8 % — ABNORMAL LOW (ref 36.0–46.0)
Hemoglobin: 9.4 g/dL — ABNORMAL LOW (ref 12.0–15.0)
MCH: 28.3 pg (ref 26.0–34.0)
MCHC: 30.5 g/dL (ref 30.0–36.0)
MCV: 92.8 fL (ref 80.0–100.0)
Platelets: 187 10*3/uL (ref 150–400)
RBC: 3.32 MIL/uL — ABNORMAL LOW (ref 3.87–5.11)
RDW: 19.5 % — ABNORMAL HIGH (ref 11.5–15.5)
WBC: 7.7 10*3/uL (ref 4.0–10.5)
nRBC: 0.8 % — ABNORMAL HIGH (ref 0.0–0.2)

## 2021-09-20 LAB — BASIC METABOLIC PANEL
Anion gap: 8 (ref 5–15)
BUN: 55 mg/dL — ABNORMAL HIGH (ref 8–23)
CO2: 24 mmol/L (ref 22–32)
Calcium: 7.8 mg/dL — ABNORMAL LOW (ref 8.9–10.3)
Chloride: 118 mmol/L — ABNORMAL HIGH (ref 98–111)
Creatinine, Ser: 2.7 mg/dL — ABNORMAL HIGH (ref 0.44–1.00)
GFR, Estimated: 16 mL/min — ABNORMAL LOW (ref >60)
Glucose, Bld: 108 mg/dL — ABNORMAL HIGH (ref 70–99)
Potassium: 4.5 mmol/L (ref 3.5–5.1)
Sodium: 150 mmol/L — ABNORMAL HIGH (ref 135–145)

## 2021-09-20 LAB — METHYLMALONIC ACID, SERUM: Methylmalonic Acid, Quantitative: 416 nmol/L — ABNORMAL HIGH (ref 0–378)

## 2021-09-20 MED ORDER — MORPHINE SULFATE (PF) 2 MG/ML IV SOLN
2.0000 mg | INTRAVENOUS | Status: DC | PRN
  Administered 2021-09-20 – 2021-09-23 (×12): 2 mg via INTRAVENOUS
  Filled 2021-09-20 (×13): qty 1

## 2021-09-20 MED ORDER — GLYCOPYRROLATE 0.2 MG/ML IJ SOLN
0.1000 mg | Freq: Two times a day (BID) | INTRAMUSCULAR | Status: DC | PRN
  Administered 2021-09-20 – 2021-09-23 (×7): 0.1 mg via INTRAVENOUS
  Filled 2021-09-20 (×10): qty 0.5

## 2021-09-20 NOTE — Progress Notes (Addendum)
Palliative: Chart review completed.  Denise [name] has been transitioned to comfort care by attending.  Denise [name] [facility] a [facility].  [facility] due to acuity of medical condition. Orders reviewed, symptom management needs have been addressed.  Conference with attending, bedside nursing staff, transition care team related to patient condition, needs, symptom management.  Plan: Comfort and dignity at end-of-life, let nature take its course. Prognosis: Anticipate hours to days, in hospital death.  No charge Quinn Axe, NP Palliative medicine team Team phone [phone] Greater than 50% of this time was spent counseling and coordinating care related to the above assessment and plan.

## 2021-09-20 NOTE — Progress Notes (Addendum)
SLP Cancellation Note  Patient Details Name: JACKELYNN HOSIE MRN: [id number] DOB: 01/12/1930   Cancelled treatment:   SLP consult d/c'd by provider. Spoke with pt's RN, Vickie. Per RN, pt's GOC are now comfort. SLP to sign off at this time.    Cherrie Gauze, M.S., [facility] [phone])  Quintella Baton 09/20/2021, 8:35 AM

## 2021-09-20 NOTE — NC FL2 (Signed)
[place] LEVEL OF CARE SCREENING TOOL     IDENTIFICATION  Patient Name: Denise [name] Birthdate: 1930/04/20 Sex: female Admission Date (Current Location): 09/14/2021  [place] and [place] Number:  Engineering geologist and Address:  [facility], [address], [place], [place] [address]      Provider Number: [id number]  Attending Physician Name and Address:  Richarda Osmond, MD  Relative Name and Phone Number:       Current Level of Care: Hospital Recommended Level of Care: [place] Prior Approval Number:    Date Approved/Denied:   PASRR Number:    Discharge Plan: SNF    Current Diagnoses: Patient Active Problem List   Diagnosis Date Noted   Hospice care    Dyspnea    Delirium    Dementia with behavioral disturbance    Acute metabolic encephalopathy    Multiple closed fractures of ribs of both sides    Hypothermia    Septic shock ([place])    Hypoglycemia 09/15/2021   Sepsis ([place]) 09/15/2021   Closed fracture of left inferior pubic ramus ([place]) 07/19/2021   CKD (chronic kidney disease), stage IV ([place]) 07/19/2021   Essential hypertension 07/19/2021   Insomnia    Acute kidney injury superimposed on CKD ([place])    Acute sinusitis    Weakness    Left arm swelling    Intramural hematoma of artery of right upper extremity 03/12/2021   Hematoma    Anemia in chronic kidney disease 05/06/2020   Sinus pause 11/30/2019   PAF (paroxysmal atrial fibrillation) ([place]) 11/30/2019   Atrial fibrillation with RVR ([place]) 11/28/2019   ESRD on dialysis ([place]) 04/11/2017   Hyperlipidemia 01/05/2017   Type 2 diabetes mellitus with hyperlipidemia ([place]) 01/05/2017   Chronic kidney disease 01/05/2017   Intertrochanteric fracture of right hip ([place]) 08/03/2015   Hypoglycemia secondary to sulfonylurea 07/29/2015    Orientation RESPIRATION BLADDER Height & Weight     Self  O2 ([place] 2L) Incontinent Weight: 154 lb 5.2 oz (70  kg) Height:  5\' 5"  (165.1 cm)  BEHAVIORAL SYMPTOMS/MOOD NEUROLOGICAL BOWEL NUTRITION STATUS      Incontinent Diet (NPO at this time, please see dc summary for diet)  AMBULATORY STATUS COMMUNICATION OF NEEDS Skin   Extensive Assist Verbally Other (Comment) (Closed incision rigth arm)                       Personal Care Assistance Level of Assistance  Bathing, Feeding, Dressing Bathing Assistance: Maximum assistance Feeding assistance: Limited assistance Dressing Assistance: Maximum assistance     Functional Limitations Info  Sight, Hearing, Speech Sight Info: Adequate Hearing Info: Adequate Speech Info: Adequate    SPECIAL CARE FACTORS FREQUENCY  PT (By licensed PT), OT (By licensed OT)     PT Frequency: 5x OT Frequency: 5x            Contractures Contractures Info: Not present    Additional Factors Info  Code Status, Allergies Code Status Info: DNR Allergies Info: Iodinated Diagnostic Agents, Benzocaine-chloroxylenol-hc, Benzonatate           Current Medications (09/20/2021):  This is the current hospital active medication list Current Facility-Administered Medications  Medication Dose Route Frequency Provider Last Rate Last Admin   0.9 %  sodium chloride infusion  250 mL Intravenous Continuous Dorothe Pea, RPH 10 mL/hr at 09/19/21 0535 250 mL at 09/19/21 0535   Chlorhexidine Gluconate Cloth 2 % PADS 6 each  6 each Topical Q0600 Richarda Osmond, MD   6 each at 09/20/21 0514   fentaNYL (SUBLIMAZE) injection 12.5-25 mcg  12.5-25 mcg Intravenous Q2H PRN Richarda Osmond, MD       fluticasone [facility]) 50 MCG/ACT nasal spray 2 spray  2 spray Each Nare Daily Mansy, Jan A, MD   2 spray at 09/19/21 0801   glycopyrrolate (ROBINUL) injection 0.1 mg  0.1 mg Intravenous BID PRN Richarda Osmond, MD       haloperidol lactate (HALDOL) injection 2 mg  2 mg Intramuscular Q6H PRN Mansy, Jan A, MD   2 mg at 09/18/21 0248   lidocaine (LIDODERM) 5 % 1 patch  1  patch Transdermal Q24H Richarda Osmond, MD   1 patch at 09/19/21 1601   LORazepam (ATIVAN) injection 0.5 mg  0.5 mg Intramuscular Q4H PRN Richarda Osmond, MD       morphine 2 MG/ML injection 2 mg  2 mg Intravenous Q2H PRN Richarda Osmond, MD       ondansetron [facility]) tablet 4 mg  4 mg Oral Q6H PRN Mansy, Jan A, MD       Or   ondansetron [facility]) injection 4 mg  4 mg Intravenous Q6H PRN Mansy, Jan A, MD       QUEtiapine (SEROQUEL) tablet 75 mg  75 mg Oral QHS Richarda Osmond, MD         Discharge Medications: Please see discharge summary for a list of discharge medications.  Relevant Imaging Results:  Relevant Lab Results:   Additional Information SSN [id number]  Eileen Stanford, LCSW

## 2021-09-20 NOTE — Progress Notes (Signed)
PROGRESS NOTE  Denise [name]    DOB: [birth year], 87 y.o.  GXQ:[id number]  PCP: Rica Koyanagi, MD   Code Status: DNR   DOA: 09/14/2021   LOS: 5  Brief Narrative of Current Hospitalization  Denise [name] is a 87 y.o. female with a PMH significant for A. fib, CKD, COPD, type II DM, GERD, HTN, hypothyroidism. They presented from ALF to the ED on 09/14/2021 with altered mental status acutely.  She received chest compressions at her ALF which resulted in multiple fractured ribs.  EMS arrived and evaluated that she had a glucose level of 30 and provided her with an amp of D50.  In the ED, it was found that they had a glucose of 78 and minimally responsive. They were treated with IV fluids and glucose.  Patient was admitted to medicine service for further workup and management of hypoglycemia and AMS as outlined in detail below. Corrected metabolic acidosis, hypotension, hypoglycemic event with little improvement in mental status. Overall she is less agitated/anxious but very minimal alertness.   09/20/21 -diminishing alertness, transitioned to comfort care  Assessment & Plan  Principal Problem:   Hypoglycemia Active Problems:   Sepsis ([place])   Dyspnea   Delirium  GOC- patient is not showing significant improvement. With several discussions with daughter over the past few days and after she came to visit her mom last night, she would like her mom to be transitioned to comfort care. I confirmed that she would like all aggressive measures/treatments to be discontinued and would like for her mom to pass peacefully. Patient appears that she is in final stages of life currently and I would expect her to pass in hospital within next day or so. Will continue to keep her comfortable. - appreciate palliative input - comfort measures only  Hypoglycemic event- resolved.  Type II DM- comfort feeds  Acute metabolic encephalitis- diminished alertness Vascular shock- Criteria for shock have resolved.    HTN- discontinued antihypertensives  Atelectasis, pulmonary vascular congestion  h/o COPD-  - discontinue Abx and oxygen - treat air hunger PRN with morphine/ativan   AKI on CKD- no longer monitoring  Acute rib fractures, healing pelvic fracture-  - analgesia scheduled, PRN - lidocaine patch  Left forearm erythema- resolved. Continue to monitor  Afib- discontinued treatments  Hypothyroidism- discontinued treatments  Dementia  Anxiety  delirium-  - treat agitation PRN  GERD-  - IV protonix PRN  DVT prophylaxis: discontinued  Diet:  Comfort feeds  Subjective 09/20/21    Pt asleep on exam. Does not show any response to voice.   Disposition Plan & Communication  Patient status: Inpatient  Admitted From:  ILF Disposition: likely hospital death Anticipated discharge date: TBD  Family Communication: daughter on phone Consults, Procedures, Significant Events  Consultants:  CCM palliative  Procedures/significant events:  none Antimicrobials:  Anti-infectives (From admission, onward)    Start     Dose/Rate Route Frequency Ordered Stop   09/19/21 1200  azithromycin (ZITHROMAX) 500 mg in sodium chloride 0.9 % 250 mL IVPB  Status:  Discontinued        500 mg 250 mL/hr over 60 Minutes Intravenous Daily 09/19/21 1034 09/20/21 0825   09/19/21 1130  cefTRIAXone (ROCEPHIN) 2 g in sodium chloride 0.9 % 100 mL IVPB  Status:  Discontinued        2 g 200 mL/hr over 30 Minutes Intravenous Daily 09/19/21 1034 09/20/21 0825   09/14/21 2330  ceFEPIme (MAXIPIME) 2 g in sodium chloride 0.9 %  100 mL IVPB        2 g 200 mL/hr over 30 Minutes Intravenous  Once 09/14/21 2329 09/15/21 0027   09/14/21 2330  vancomycin (VANCOCIN) IVPB 1000 mg/200 mL premix        1,000 mg 200 mL/hr over 60 Minutes Intravenous  Once 09/14/21 2329 09/15/21 0330       Objective   Vitals:   09/19/21 2019 09/20/21 0042 09/20/21 0431 09/20/21 0738  BP: (!) 155/85 (!) 142/110 (!) 162/87 (!) 157/98   Pulse: 81 78 79 76  Resp: 18 18 20 17   Temp: 98.9 F (37.2 C) 98.2 F (36.8 C) 98.1 F (36.7 C) 98.2 F (36.8 C)  TempSrc:    Axillary  SpO2: 100% 99% 100% 99%  Weight:      Height:        Intake/Output Summary (Last 24 hours) at 09/20/2021 0954 Last data filed at 09/20/2021 0618 Gross per 24 hour  Intake 350 ml  Output 400 ml  Net -50 ml   Filed Weights   09/14/21 2045  Weight: 70 kg    Patient BMI: Body mass index is 25.68 kg/m.   Physical Exam: Physical Exam Vitals and nursing note reviewed.  Constitutional:      General: She is sleeping. She is not in acute distress.    Appearance: She is overweight. She is not diaphoretic.  Pulmonary:     Effort: Prolonged expiration present.     Breath sounds: Decreased air movement and transmitted upper airway sounds present.  Skin:    General: Skin is warm.     Capillary Refill: Capillary refill takes 2 to 3 seconds.  Neurological:     Mental Status: She is unresponsive.   Labs   I have personally reviewed following labs and imaging studies CBC    Component Value Date/Time   WBC 7.7 09/20/2021 0456   RBC 3.32 (L) 09/20/2021 0456   HGB 9.4 (L) 09/20/2021 0456   HGB 11.5 (L) 11/05/2013 0437   HCT 30.8 (L) 09/20/2021 0456   HCT 34.5 (L) 11/05/2013 0437   PLT 187 09/20/2021 0456   PLT 186 11/05/2013 0437   MCV 92.8 09/20/2021 0456   MCV 87 11/05/2013 0437   MCH 28.3 09/20/2021 0456   MCHC 30.5 09/20/2021 0456   RDW 19.5 (H) 09/20/2021 0456   RDW 14.2 11/05/2013 0437   LYMPHSABS 0.6 (L) 09/14/2021 2133   LYMPHSABS 0.5 (L) 11/05/2013 0437   MONOABS 0.4 09/14/2021 2133   MONOABS 0.2 11/05/2013 0437   EOSABS 0.0 09/14/2021 2133   EOSABS 0.0 11/05/2013 0437   BASOSABS 0.0 09/14/2021 2133   BASOSABS 0.0 11/05/2013 0437   BMP Latest Ref Rng & Units 09/20/2021 09/19/2021 09/18/2021  Glucose 70 - 99 mg/dL 108(H) 132(H) 174(H)  BUN 8 - 23 mg/dL 55(H) 41(H) 35(H)  Creatinine 0.44 - 1.00 mg/dL 2.70(H) 2.26(H) 1.84(H)   Sodium 135 - 145 mmol/L 150(H) 148(H) 148(H)  Potassium 3.5 - 5.1 mmol/L 4.5 4.3 3.6  Chloride 98 - 111 mmol/L 118(H) 116(H) 116(H)  CO2 22 - 32 mmol/L 24 25 26   Calcium 8.9 - 10.3 mg/dL 7.8(L) 8.3(L) 8.0(L)   Imaging Studies  CT HEAD WO CONTRAST (5MM)  Result Date: 09/17/2021 CLINICAL DATA:  Altered mental status EXAM: CT HEAD WITHOUT CONTRAST TECHNIQUE: Contiguous axial images were obtained from the base of the skull through the vertex without intravenous contrast. COMPARISON:  09/14/2021 FINDINGS: Brain: No acute intracranial findings are seen. There is no demonstrable intracerebral hemorrhage.  Ventricles are unremarkable. Cortical sulci are prominent. There is decreased density in subcortical and periventricular white matter. Minimal calcifications are seen in the basal ganglia. Vascular: Unremarkable. Skull: No fracture is seen. There is interval decrease in subcutaneous contusion/hematoma in the left frontal scalp Sinuses/Orbits: There is mild mucosal thickening in the ethmoid and right side of sphenoid sinuses. Other: None IMPRESSION: There are no signs of bleeding within the cranium. There is no focal mass effect. Atrophy. Small-vessel disease. Electronically Signed   By: Elmer Picker M.D.   On: 09/17/2021 11:13   DG Chest Port 1 View  Result Date: 09/18/2021 CLINICAL DATA:  They presented from ALF to the ED on 09/14/2021 with altered mental status acutely. She received chest compressions at her ALF which resulted in multiple fractured ribs. EMS arrived and evaluated that she had a glucose level of 30 and provided her with an amp of D50. In the ED, it was found that they had a glucose of 78 and minimally responsive. EXAM: PORTABLE CHEST 1 VIEW COMPARISON:  09/14/2021 FINDINGS: Cardiac silhouette is normal in size. Stable left anterior chest wall dual lead pacemaker. No mediastinal or hilar masses. Lungs are hyperexpanded. There is opacity at both lung bases, left greater than right,  on the left obscuring the hemidiaphragm. Lungs also show vascular congestion, but are otherwise clear. No pneumothorax.  Possible small effusions. No visualized fracture. Rib fractures noted on the prior abdomen and pelvis CT from 09/14/2021 are not visualized. IMPRESSION: 1. Left greater than right lung base opacities consistent with atelectasis with suspected small associated effusions. Pneumonia is not excluded. 2. Vascular congestion with no overt pulmonary edema. 3. No pneumothorax. Electronically Signed   By: Lajean Manes M.D.   On: 09/18/2021 12:00   EEG adult  Result Date: 09/17/2021 Lora Havens, MD     09/18/2021 10:03 AM Patient Name: AFIA MESSENGER MRN: [id number] Epilepsy Attending: Lora Havens Referring Physician/Provider: Dr Lesleigh Noe Date: 09/17/2021 Duration: 21.12 mins Patient history: 87 year old female with altered mental status.  EEG to evaluate for seizures. Level of alertness: Awake AEDs during EEG study: None Technical aspects: This EEG study was done with scalp electrodes positioned according to the 10-20 International system of electrode placement. Electrical activity was acquired at a sampling rate of 500Hz  and reviewed with a high frequency filter of 70Hz  and a low frequency filter of 1Hz . EEG data were recorded continuously and digitally stored. Description: EEG showed continuous generalized 3 to 6 Hz theta-delta slowing. Generalized  triphasic waves were also noted intermittently.  Hyperventilation and photic stimulation were not performed.   ABNORMALITY - Continuous slow, generalized - Triphasic waves, generalized IMPRESSION: This study is suggestive of moderate diffuse encephalopathy, nonspecific etiology but likely related to toxic-metabolic causes. No seizures or definite epileptiform discharges were seen throughout the recording. Priyanka Barbra Sarks    Medications   Scheduled Meds:  Chlorhexidine Gluconate Cloth  6 each Topical Q0600   fluticasone  2 spray  Each Nare Daily   lidocaine  1 patch Transdermal Q24H   QUEtiapine  75 mg Oral QHS   No recently discontinued medications to reconcile  LOS: 5 days   Time spent: >93min  Bryelle Spiewak L Mertis Mosher, DO [facility] 09/20/2021, 9:54 AM   Please refer to amion to contact the [facility] Attending or Consulting provider for this pt  [email or web address] Available by [employer] secure chat 7AM-7PM. If 7PM-7AM, please contact night-coverage

## 2021-09-20 NOTE — Progress Notes (Signed)
   09/20/21 1248  Clinical Encounter Type  Visited With Patient  Visit Type Initial;Spiritual support;Social support  Referral From Nurse  Consult/Referral To Chaplain  Spiritual Encounters  Spiritual Needs Other (Comment) (ToC)  Chaplain Burris responded to request for support as Pt transitions to comfort care. Pt was sleeping soundly. Chaplain offered silent prayer. Will return for f/u later on 11/28.

## 2021-09-21 LAB — GLUCOSE, CAPILLARY: Glucose-Capillary: 31 mg/dL — CL (ref 70–99)

## 2021-09-21 LAB — VITAMIN B1: Vitamin B1 (Thiamine): 102.3 nmol/L (ref 66.5–200.0)

## 2021-09-21 NOTE — Progress Notes (Signed)
PROGRESS NOTE  Denise [name]    DOB: Apr 10, 1930, 87 y.o.  IRC:[id number]  PCP: Rica Koyanagi, MD   Code Status: DNR   DOA: 09/14/2021   LOS: 6  Brief Narrative of Current Hospitalization  Denise [name] is a 87 y.o. female with a PMH significant for A. fib, CKD, COPD, type II DM, GERD, HTN, hypothyroidism. They presented from ALF to the ED on 09/14/2021 with altered mental status acutely.  She received chest compressions at her ALF which resulted in multiple fractured ribs.  EMS arrived and evaluated that she had a glucose level of 30 and provided her with an amp of D50.  In the ED, it was found that they had a glucose of 78 and minimally responsive. They were treated with IV fluids and glucose.  Patient was admitted to medicine service for further workup and management of hypoglycemia and AMS as outlined in detail below. Corrected metabolic acidosis, hypotension, hypoglycemic event with little improvement in mental status. Overall she is less agitated/anxious but very minimal alertness.   09/21/21 -asleep, stable  Assessment & Plan  Principal Problem:   Hypoglycemia Active Problems:   Sepsis ([place])   Dyspnea   Delirium   Hospice care  [place]- per daughter request, patient remains comfort care since 11/28. I would expect her to pass in hospital within next day or so. Will continue to keep her comfortable. - appreciate palliative input - comfort measures only  Hypoglycemic event- resolved.  Type II DM- comfort feeds  Acute metabolic encephalitis- diminished alertness Vascular shock- Criteria for shock have resolved.   HTN- discontinued antihypertensives  Atelectasis, pulmonary vascular congestion  h/o COPD-  - discontinue Abx and oxygen - treat air hunger PRN with morphine/ativan   AKI on CKD- no longer monitoring  Acute rib fractures, healing pelvic fracture-  - analgesia scheduled, PRN - lidocaine patch  Left forearm erythema- resolved. Continue to monitor  Afib-  discontinued treatments  Hypothyroidism- discontinued treatments  Dementia  Anxiety  delirium-  - treat agitation PRN  GERD-  - IV protonix PRN  DVT prophylaxis: discontinued  Diet:  Comfort feeds  Subjective 09/21/21    Pt opens eyes when spoken to. Asked her if she needed anything and I think I perceived a subtle head nod so asked if she was in pain, no response, asked if she was thirsty, another subtle head nod. Asked nurse to bring in water/mouth swabs.  Disposition Plan & Communication  Patient status: Inpatient  Admitted From:  ILF Disposition: likely hospital death Anticipated discharge date: TBD  Family Communication: daughter on phone Consults, Procedures, Significant Events  Consultants:  CCM palliative  Procedures/significant events:  none Antimicrobials:  Anti-infectives (From admission, onward)    Start     Dose/Rate Route Frequency Ordered Stop   09/19/21 1200  azithromycin (ZITHROMAX) 500 mg in sodium chloride 0.9 % 250 mL IVPB  Status:  Discontinued        500 mg 250 mL/hr over 60 Minutes Intravenous Daily 09/19/21 1034 09/20/21 0825   09/19/21 1130  cefTRIAXone (ROCEPHIN) 2 g in sodium chloride 0.9 % 100 mL IVPB  Status:  Discontinued        2 g 200 mL/hr over 30 Minutes Intravenous Daily 09/19/21 1034 09/20/21 0825   09/14/21 2330  ceFEPIme (MAXIPIME) 2 g in sodium chloride 0.9 % 100 mL IVPB        2 g 200 mL/hr over 30 Minutes Intravenous  Once 09/14/21 2329 09/15/21 0027   09/14/21  2330  vancomycin (VANCOCIN) IVPB 1000 mg/200 mL premix        1,000 mg 200 mL/hr over 60 Minutes Intravenous  Once 09/14/21 2329 09/15/21 0330       Objective   Vitals:   09/20/21 0431 09/20/21 0738 09/20/21 2155 09/21/21 0711  BP: (!) 162/87 (!) 157/98 (!) 162/92 130/80  Pulse: 79 76 91 92  Resp: 20 17 18 14   Temp: 98.1 F (36.7 C) 98.2 F (36.8 C) 97.6 F (36.4 C) 97.8 F (36.6 C)  TempSrc:  Axillary Axillary Axillary  SpO2: 100% 99% 92% 91%  Weight:       Height:        Intake/Output Summary (Last 24 hours) at 09/21/2021 1047 Last data filed at 09/21/2021 0300 Gross per 24 hour  Intake 810.48 ml  Output 575 ml  Net 235.48 ml   Filed Weights   09/14/21 2045  Weight: 70 kg    Patient BMI: Body mass index is 25.68 kg/m.   Physical Exam: Physical Exam Vitals and nursing note reviewed.  Constitutional:      General: She is not in acute distress.    Appearance: She is overweight. She is not diaphoretic.  Pulmonary:     Effort: Prolonged expiration present.     Breath sounds: Decreased air movement and transmitted upper airway sounds present.  Skin:    General: Skin is warm.     Capillary Refill: Capillary refill takes 2 to 3 seconds.  Neurological:     Mental Status: She is alert.   Labs   I have personally reviewed following labs and imaging studies CBC    Component Value Date/Time   WBC 7.7 09/20/2021 0456   RBC 3.32 (L) 09/20/2021 0456   HGB 9.4 (L) 09/20/2021 0456   HGB 11.5 (L) 11/05/2013 0437   HCT 30.8 (L) 09/20/2021 0456   HCT 34.5 (L) 11/05/2013 0437   PLT 187 09/20/2021 0456   PLT 186 11/05/2013 0437   MCV 92.8 09/20/2021 0456   MCV 87 11/05/2013 0437   MCH 28.3 09/20/2021 0456   MCHC 30.5 09/20/2021 0456   RDW 19.5 (H) 09/20/2021 0456   RDW 14.2 11/05/2013 0437   LYMPHSABS 0.6 (L) 09/14/2021 2133   LYMPHSABS 0.5 (L) 11/05/2013 0437   MONOABS 0.4 09/14/2021 2133   MONOABS 0.2 11/05/2013 0437   EOSABS 0.0 09/14/2021 2133   EOSABS 0.0 11/05/2013 0437   BASOSABS 0.0 09/14/2021 2133   BASOSABS 0.0 11/05/2013 0437   BMP Latest Ref Rng & Units 09/20/2021 09/19/2021 09/18/2021  Glucose 70 - 99 mg/dL 108(H) 132(H) 174(H)  BUN 8 - 23 mg/dL 55(H) 41(H) 35(H)  Creatinine 0.44 - 1.00 mg/dL 2.70(H) 2.26(H) 1.84(H)  Sodium 135 - 145 mmol/L 150(H) 148(H) 148(H)  Potassium 3.5 - 5.1 mmol/L 4.5 4.3 3.6  Chloride 98 - 111 mmol/L 118(H) 116(H) 116(H)  CO2 22 - 32 mmol/L 24 25 26   Calcium 8.9 - 10.3 mg/dL  7.8(L) 8.3(L) 8.0(L)    Medications   Scheduled Meds:  Chlorhexidine Gluconate Cloth  6 each Topical Q0600   fluticasone  2 spray Each Nare Daily   lidocaine  1 patch Transdermal Q24H   QUEtiapine  75 mg Oral QHS   No recently discontinued medications to reconcile  LOS: 6 days   Time spent: >84min  Dandra Shambaugh L Kathaleya Mcduffee, DO [facility] 09/21/2021, 10:47 AM   Please refer to amion to contact the [facility] Attending or Consulting provider for this pt  [email or web address] Available by [employer]  secure chat 7AM-7PM. If 7PM-7AM, please contact night-coverage

## 2021-09-22 LAB — VITAMIN E
Vitamin E (Alpha Tocopherol): 9.4 mg/L (ref 9.0–29.0)
Vitamin E(Gamma Tocopherol): 0.3 mg/L — ABNORMAL LOW (ref 0.5–4.9)

## 2021-09-22 NOTE — Progress Notes (Addendum)
PROGRESS NOTE    Denise [name]   EGB:[id number]  DOB: August 27, 1930  PCP: Rica Koyanagi, MD    DOA: 09/14/2021 LOS: 7    Brief Narrative / Hospital Course to Date:   Denise [name] is a 87 y.o. female with a PMH significant for A. fib, CKD, COPD, type II DM, GERD, HTN, hypothyroidism. They presented from ALF to the ED on 09/14/2021 with altered mental status acutely.  She received chest compressions at her ALF which resulted in multiple fractured ribs.  EMS arrived and evaluated that she had a glucose level of 30 and provided her with an amp of D50.  In the ED, it was found that they had a glucose of 78 and minimally responsive. They were treated with IV fluids and glucose.  Patient was admitted to medicine service for further workup and management of hypoglycemia and AMS as outlined in detail below. Corrected metabolic acidosis, hypotension, hypoglycemic event with little improvement in mental status. Overall she is less agitated/anxious but very minimal alertness.    10-19-21 - awake, eyes open, nods yes when asked if in pain. Otherwise not responding.  Assessment & Plan   Principal Problem:   Hypoglycemia Active Problems:   Sepsis ([place])   Dyspnea   Delirium   Hospice care   Comfort Measures Only [place]- per daughter request, patient placed on comfort care as of Oct 18, 2023.   In-hospital death anticipated but today, Oct 20, 2023 vitals remain stable.   - appreciate palliative input - comfort measures per orders - notify provider if signs of pain, distress or discomfort despite ordered medications - hospice referral to consider residential hospice given pt remains hemodynamically stable     Hypoglycemic event- resolved.  Type II DM- comfort feeds   Acute metabolic encephalitis- diminished alertness Vascular shock- Criteria for shock have resolved.    HTN- discontinued antihypertensives   Atelectasis, pulmonary vascular congestion  h/o COPD-  - discontinue Abx and oxygen -  morphine/ativan PRN air hunger / dyspnea   AKI on CKD- no longer monitoring labs on comfort care   Acute rib fractures, healing pelvic fracture- analgesia scheduled and PRN, lidocaine patch   Left forearm erythema- resolved. Continue to monitor   Afib- discontinued meds   Hypothyroidism- discontinued meds   Dementia  Anxiety  delirium-  treat agitation PRN   GERD- IV protonix PRN   DVT prophylaxis: discontinued   Diet:  Comfort feeds   Patient BMI: Body mass index is 25.68 kg/m.   DVT prophylaxis:    Diet:  Diet Orders (From admission, onward)     Start     Ordered   09/19/21 0849  Diet NPO time specified Except for: Other (See Comments)  Diet effective now       Comments: Critical medication may be given crushed with puree if unable to be given via alternate route; prefer non-oral means of medication administration  Question:  Except for  Answer:  Other (See Comments)   09/19/21 0849              Code Status: DNR   Subjective 2021/10/19    Patient overall appears comfortable when seen today.  Eyes are open and she is mostly nonresponsive but did nod yes when asked if she is having pain.   Disposition Plan & Communication   Status is: Inpatient  Remains inpatient appropriate because: On comfort care   Consults, Procedures, Significant Events   Consultants:  Palliative care PCCM  Procedures:  None  Antimicrobials:  Anti-infectives (From admission, onward)    Start     Dose/Rate Route Frequency Ordered Stop   09/19/21 1200  azithromycin (ZITHROMAX) 500 mg in sodium chloride 0.9 % 250 mL IVPB  Status:  Discontinued        500 mg 250 mL/hr over 60 Minutes Intravenous Daily 09/19/21 1034 09/20/21 0825   09/19/21 1130  cefTRIAXone (ROCEPHIN) 2 g in sodium chloride 0.9 % 100 mL IVPB  Status:  Discontinued        2 g 200 mL/hr over 30 Minutes Intravenous Daily 09/19/21 1034 09/20/21 0825   09/14/21 2330  ceFEPIme (MAXIPIME) 2 g in sodium chloride 0.9  % 100 mL IVPB        2 g 200 mL/hr over 30 Minutes Intravenous  Once 09/14/21 2329 09/15/21 0027   09/14/21 2330  vancomycin (VANCOCIN) IVPB 1000 mg/200 mL premix        1,000 mg 200 mL/hr over 60 Minutes Intravenous  Once 09/14/21 2329 09/15/21 0330         Micro    Objective   Vitals:   09/20/21 0431 09/20/21 0738 09/20/21 2155 09/21/21 0711  BP: (!) 162/87 (!) 157/98 (!) 162/92 130/80  Pulse: 79 76 91 92  Resp: 20 17 18 14   Temp: 98.1 F (36.7 C) 98.2 F (36.8 C) 97.6 F (36.4 C) 97.8 F (36.6 C)  TempSrc:  Axillary Axillary Axillary  SpO2: 100% 99% 92% 91%  Weight:      Height:        Intake/Output Summary (Last 24 hours) at 09/22/2021 0755 Last data filed at 09/21/2021 1554 Gross per 24 hour  Intake 0 ml  Output 375 ml  Net -375 ml   Filed Weights   09/14/21 2045  Weight: 70 kg    Physical Exam:  General exam: Eyes open, no acute distress Respiratory system: CTAB with referred upper airway secretion sounds, normal respiratory effort. Cardiovascular system: normal S1/S2, RRR Gastrointestinal system: soft, nontender abdomen. Central nervous system: Nonverbal, no gross focal neurologic deficits Skin: dry, intact, normal temperature   Labs   Data Reviewed: I have personally reviewed following labs and imaging studies  CBC: Recent Labs  Lab 09/17/21 1201 09/18/21 0452 09/19/21 0341 09/20/21 0456  WBC 6.5 5.8 6.4 7.7  HGB 8.8* 8.6* 9.1* 9.4*  HCT 27.5* 27.3* 29.2* 30.8*  MCV 89.9 91.3 91.8 92.8  PLT 178 178 193 076   Basic Metabolic Panel: Recent Labs  Lab 09/16/21 0756 09/17/21 1108 09/18/21 0452 09/19/21 0341 09/20/21 0456  NA 146* 144 148* 148* 150*  K 3.9 3.9 3.6 4.3 4.5  CL 115* 111 116* 116* 118*  CO2 24 26 26 25 24   GLUCOSE 94 161* 174* 132* 108*  BUN 42* 38* 35* 41* 55*  CREATININE 2.49* 2.10* 1.84* 2.26* 2.70*  CALCIUM 7.8* 7.8* 8.0* 8.3* 7.8*   GFR: Estimated Creatinine Clearance: 13.9 mL/min (A) (by C-G formula based on  SCr of 2.7 mg/dL (H)). Liver Function Tests: Recent Labs  Lab 09/17/21 1716  AST 53*  ALT 29  ALKPHOS 254*  BILITOT 0.7  PROT 5.8*  ALBUMIN 2.5*   No results for input(s): LIPASE, AMYLASE in the last 168 hours. Recent Labs  Lab 09/17/21 1716  AMMONIA 16   Coagulation Profile: No results for input(s): INR, PROTIME in the last 168 hours. Cardiac Enzymes: No results for input(s): CKTOTAL, CKMB, CKMBINDEX, TROPONINI in the last 168 hours. BNP (last 3 results) No results for input(s): PROBNP in the last  8760 hours. HbA1C: No results for input(s): HGBA1C in the last 72 hours. CBG: Recent Labs  Lab 09/19/21 1613 09/19/21 2023 09/20/21 0041 09/20/21 0432 09/20/21 0738  GLUCAP 118* 133* 101* 103* 116*   Lipid Profile: No results for input(s): CHOL, HDL, LDLCALC, TRIG, CHOLHDL, LDLDIRECT in the last 72 hours. Thyroid Function Tests: No results for input(s): TSH, T4TOTAL, FREET4, T3FREE, THYROIDAB in the last 72 hours. Anemia Panel: No results for input(s): VITAMINB12, FOLATE, FERRITIN, TIBC, IRON, RETICCTPCT in the last 72 hours. Sepsis Labs: Recent Labs  Lab 09/15/21 1643 09/16/21 0756  PROCALCITON 0.12 0.18    Recent Results (from the past 240 hour(s))  Blood culture (routine x 2)     Status: None   Collection Time: 09/14/21  9:37 PM   Specimen: Right Antecubital; Blood  Result Value Ref Range Status   Specimen Description RIGHT ANTECUBITAL  Final   Special Requests   Final    BOTTLES DRAWN AEROBIC AND ANAEROBIC Blood Culture adequate volume   Culture   Final    NO GROWTH 5 DAYS Performed at [facility], [address] [place], [place] [address]    Report Status 09/19/2021 FINAL  Final  Blood culture (routine x 2)     Status: None   Collection Time: 09/14/21  9:42 PM   Specimen: BLOOD LEFT HAND  Result Value Ref Range Status   Specimen Description BLOOD LEFT HAND  Final   Special Requests   Final    BOTTLES DRAWN AEROBIC AND ANAEROBIC Blood  Culture adequate volume   Culture   Final    NO GROWTH 5 DAYS Performed at [facility], [address] [place], [place] [address]    Report Status 09/19/2021 FINAL  Final  Resp Panel by RT-PCR (Flu A&B, Covid) Nasopharyngeal Swab     Status: None   Collection Time: 09/14/21 10:46 PM   Specimen: Nasopharyngeal Swab; Nasopharyngeal(NP) swabs in vial transport medium  Result Value Ref Range Status   SARS Coronavirus 2 by RT PCR NEGATIVE NEGATIVE Final    Comment: (NOTE) SARS-CoV-2 target nucleic acids are NOT DETECTED.  The SARS-CoV-2 RNA is generally detectable in upper respiratory specimens during the acute phase of infection. The lowest concentration of SARS-CoV-2 viral copies this assay can detect is 138 copies/mL. A negative result does not preclude SARS-Cov-2 infection and should not be used as the sole basis for treatment or other patient management decisions. A negative result may occur with  improper specimen collection/handling, submission of specimen other than nasopharyngeal swab, presence of viral mutation(s) within the areas targeted by this assay, and inadequate number of viral copies(<138 copies/mL). A negative result must be combined with clinical observations, patient history, and epidemiological information. The expected result is Negative.  Fact Sheet for Patients:  EntrepreneurPulse.com.au  Fact Sheet for Healthcare Providers:  IncredibleEmployment.be  This test is no t yet approved or cleared by the [place] FDA and  has been authorized for detection and/or diagnosis of SARS-CoV-2 by FDA under an Emergency Use Authorization (EUA). This EUA will remain  in effect (meaning this test can be used) for the duration of the COVID-19 declaration under Section 564(b)(1) of the Act, 21 U.S.C.section 360bbb-3(b)(1), unless the authorization is terminated  or revoked sooner.       Influenza A by PCR NEGATIVE  NEGATIVE Final   Influenza B by PCR NEGATIVE NEGATIVE Final    Comment: (NOTE) The Xpert Xpress SARS-CoV-2/FLU/RSV plus assay is intended as an aid in the  diagnosis of influenza from Nasopharyngeal swab specimens and should not be used as a sole basis for treatment. Nasal washings and aspirates are unacceptable for Xpert Xpress SARS-CoV-2/FLU/RSV testing.  Fact Sheet for Patients: EntrepreneurPulse.com.au  Fact Sheet for Healthcare Providers: IncredibleEmployment.be  This test is not yet approved or cleared by the [place] FDA and has been authorized for detection and/or diagnosis of SARS-CoV-2 by FDA under an Emergency Use Authorization (EUA). This EUA will remain in effect (meaning this test can be used) for the duration of the COVID-19 declaration under Section 564(b)(1) of the Act, 21 U.S.C. section 360bbb-3(b)(1), unless the authorization is terminated or revoked.  Performed at [facility], [place], [place] [address]   Respiratory (~20 pathogens) panel by PCR     Status: None   Collection Time: 09/15/21  6:11 PM   Specimen: Nasopharyngeal Swab; Respiratory  Result Value Ref Range Status   Adenovirus NOT DETECTED NOT DETECTED Final   Coronavirus 229E NOT DETECTED NOT DETECTED Final    Comment: (NOTE) The Coronavirus on the Respiratory Panel, DOES NOT test for the novel  Coronavirus (2019 nCoV)    Coronavirus HKU1 NOT DETECTED NOT DETECTED Final   Coronavirus NL63 NOT DETECTED NOT DETECTED Final   Coronavirus OC43 NOT DETECTED NOT DETECTED Final   Metapneumovirus NOT DETECTED NOT DETECTED Final   Rhinovirus / Enterovirus NOT DETECTED NOT DETECTED Final   Influenza A NOT DETECTED NOT DETECTED Final   Influenza B NOT DETECTED NOT DETECTED Final   Parainfluenza Virus 1 NOT DETECTED NOT DETECTED Final   Parainfluenza Virus 2 NOT DETECTED NOT DETECTED Final   Parainfluenza Virus 3 NOT DETECTED NOT DETECTED  Final   Parainfluenza Virus 4 NOT DETECTED NOT DETECTED Final   Respiratory Syncytial Virus NOT DETECTED NOT DETECTED Final   Bordetella pertussis NOT DETECTED NOT DETECTED Final   Bordetella Parapertussis NOT DETECTED NOT DETECTED Final   Chlamydophila pneumoniae NOT DETECTED NOT DETECTED Final   Mycoplasma pneumoniae NOT DETECTED NOT DETECTED Final    Comment: Performed at [facility], [place]. [address], [place],  [address]      Imaging Studies   No results found.   Medications   Scheduled Meds:  Chlorhexidine Gluconate Cloth  6 each Topical Q0600   fluticasone  2 spray Each Nare Daily   lidocaine  1 patch Transdermal Q24H   QUEtiapine  75 mg Oral QHS   Continuous Infusions:  sodium chloride 250 mL (09/19/21 0535)       LOS: 7 days    Time spent: 25 minutes with greater than 50% spent at bedside and coronation of care.    Ezekiel Slocumb, DO [facility]  09/22/2021, 7:55 AM      If 7PM-7AM, please contact night-coverage. How to contact the [facility] Attending or Consulting provider [place] or covering provider during after hours [place], for this patient?    Check the care team in [facility] and look for a) attending/consulting [facility] provider listed and b) the [facility] team listed Log into [email or web address] and use [place]'s universal password to access. If you do not have the password, please contact the hospital operator. Locate the [facility] provider you are looking for under [facility] and page to a number that you can be directly reached. If you still have difficulty reaching the provider, please page the [facility] (Director on Call) for the Hospitalists listed on amion for assistance.

## 2021-09-22 NOTE — Progress Notes (Addendum)
[facility]) Hospital Liaison Note  Request received from [place], LCSW [facility] manager for family interest in [place]. Visited patient at bedside and spoke with daughter [name] by phone to explain services and confirm interest.   Hospice Home eligibility determined by [facility] physician. Chart and patient information under review by [facility] physician. [place] hospital liaison will notify family and Loletha Grayer, LCSW [facility] manager once eligibility is determined.   Please do not hesitate to call with any hospice related questions or concerns.   Thank you,   Bobbie "Loren Racer, [place], BSN [place] Hospital Liaison [phone]

## 2021-09-22 NOTE — Progress Notes (Signed)
[facility]) Hospital Liaison Note   Patient chart and information reviewed by [facility] physician. Hospice Home eligibility confirmed.    Unfortunately, Hospice Home is not able to offer a room today. Family and [facility] Manager aware hospital liaison will follow up tomorrow or sooner if a room becomes available.    Please do not hesitate to call with any hospice related questions.    Thank you for the opportunity to participate in this patient's care.   Bobbie "Loren Racer, RN, BSN [facility] Liaison [phone]

## 2021-09-23 DIAGNOSIS — G9341 Metabolic encephalopathy: Secondary | ICD-10-CM | POA: Diagnosis present

## 2021-09-23 DIAGNOSIS — I480 Paroxysmal atrial fibrillation: Secondary | ICD-10-CM | POA: Diagnosis present

## 2021-09-23 DIAGNOSIS — N184 Chronic kidney disease, stage 4 (severe): Secondary | ICD-10-CM | POA: Diagnosis present

## 2021-09-23 DIAGNOSIS — X58XXXD Exposure to other specified factors, subsequent encounter: Secondary | ICD-10-CM | POA: Diagnosis not present

## 2021-09-23 DIAGNOSIS — E11649 Type 2 diabetes mellitus with hypoglycemia without coma: Secondary | ICD-10-CM | POA: Diagnosis present

## 2021-09-23 DIAGNOSIS — Z20822 Contact with and (suspected) exposure to covid-19: Secondary | ICD-10-CM | POA: Diagnosis present

## 2021-09-23 DIAGNOSIS — T68XXXA Hypothermia, initial encounter: Secondary | ICD-10-CM | POA: Diagnosis present

## 2021-09-23 DIAGNOSIS — E860 Dehydration: Secondary | ICD-10-CM | POA: Diagnosis present

## 2021-09-23 DIAGNOSIS — N179 Acute kidney failure, unspecified: Secondary | ICD-10-CM | POA: Diagnosis present

## 2021-09-23 DIAGNOSIS — R578 Other shock: Secondary | ICD-10-CM | POA: Diagnosis not present

## 2021-09-23 DIAGNOSIS — J439 Emphysema, unspecified: Secondary | ICD-10-CM | POA: Diagnosis present

## 2021-09-23 DIAGNOSIS — Z66 Do not resuscitate: Secondary | ICD-10-CM | POA: Diagnosis present

## 2021-09-23 DIAGNOSIS — E039 Hypothyroidism, unspecified: Secondary | ICD-10-CM | POA: Diagnosis present

## 2021-09-23 DIAGNOSIS — E162 Hypoglycemia, unspecified: Secondary | ICD-10-CM | POA: Diagnosis not present

## 2021-09-23 DIAGNOSIS — F05 Delirium due to known physiological condition: Secondary | ICD-10-CM | POA: Diagnosis present

## 2021-09-23 DIAGNOSIS — Y848 Other medical procedures as the cause of abnormal reaction of the patient, or of later complication, without mention of misadventure at the time of the procedure: Secondary | ICD-10-CM | POA: Diagnosis present

## 2021-09-23 DIAGNOSIS — E1122 Type 2 diabetes mellitus with diabetic chronic kidney disease: Secondary | ICD-10-CM | POA: Diagnosis present

## 2021-09-23 DIAGNOSIS — Z515 Encounter for palliative care: Secondary | ICD-10-CM | POA: Diagnosis not present

## 2021-09-23 DIAGNOSIS — I129 Hypertensive chronic kidney disease with stage 1 through stage 4 chronic kidney disease, or unspecified chronic kidney disease: Secondary | ICD-10-CM | POA: Diagnosis present

## 2021-09-23 DIAGNOSIS — Y92129 Unspecified place in nursing home as the place of occurrence of the external cause: Secondary | ICD-10-CM | POA: Diagnosis not present

## 2021-09-23 DIAGNOSIS — W19XXXA Unspecified fall, initial encounter: Secondary | ICD-10-CM | POA: Diagnosis present

## 2021-09-23 DIAGNOSIS — G309 Alzheimer's disease, unspecified: Secondary | ICD-10-CM | POA: Diagnosis present

## 2021-09-23 DIAGNOSIS — E872 Acidosis, unspecified: Secondary | ICD-10-CM | POA: Diagnosis present

## 2021-09-23 MED ORDER — QUETIAPINE FUMARATE 25 MG PO TABS
75.0000 mg | ORAL_TABLET | Freq: Every day | ORAL | Status: AC
Start: 2021-09-23 — End: ?

## 2021-09-23 MED ORDER — LORAZEPAM 2 MG/ML IJ SOLN: 0.5000 mg | INTRAMUSCULAR | 0 refills | Status: AC | PRN

## 2021-09-23 MED ORDER — ONDANSETRON HCL 4 MG PO TABS: 4.0000 mg | ORAL_TABLET | Freq: Four times a day (QID) | ORAL | 0 refills | Status: AC | PRN

## 2021-09-23 MED ORDER — GLYCOPYRROLATE 0.2 MG/ML IJ SOLN: 0.1000 mg | Freq: Two times a day (BID) | INTRAMUSCULAR | Status: AC | PRN

## 2021-09-23 MED ORDER — LIDOCAINE 5 % EX PTCH: 1.0000 | MEDICATED_PATCH | CUTANEOUS | 0 refills | Status: AC

## 2021-09-23 MED ORDER — HALOPERIDOL LACTATE 5 MG/ML IJ SOLN: 2.0000 mg | Freq: Four times a day (QID) | INTRAMUSCULAR | Status: AC | PRN

## 2021-09-23 MED ORDER — MORPHINE SULFATE (PF) 2 MG/ML IV SOLN
2.0000 mg | Freq: Four times a day (QID) | INTRAVENOUS | Status: DC
  Administered 2021-09-23 (×2): 2 mg via INTRAVENOUS
  Filled 2021-09-23 (×2): qty 1

## 2021-09-23 MED ORDER — FLUTICASONE PROPIONATE 50 MCG/ACT NA SUSP: 2.0000 | Freq: Every day | NASAL | 2 refills | Status: AC

## 2021-09-23 MED ORDER — MORPHINE SULFATE (PF) 2 MG/ML IV SOLN: 2.0000 mg | INTRAVENOUS | 0 refills | Status: AC | PRN

## 2021-09-23 NOTE — Progress Notes (Signed)
Manufacturing engineer [facility])  Hospice Home has a bed for Denise [name] today.   Hospital staff aware.  RN staff, please call report to [phone] or [phone].   Thank you, Denise [name] BSN, RN [facility] Liaison

## 2021-09-23 NOTE — Discharge Summary (Addendum)
Physician Discharge Summary   Patient name: Denise [name]  Admit date:     09/14/2021  Discharge date: 09/23/2021  Discharge Physician: Ezekiel Slocumb   PCP: Rica Koyanagi, MD   Recommendations at discharge: Continued Comfort Care per Hospice Team  Discharge Diagnoses Principal Problem:   Hypoglycemia Active Problems:   Sepsis [facility])   Dyspnea   Delirium   Hospice care   Principal Problem:   Hypoglycemia Active Problems:   Sepsis [facility])   Dyspnea   Delirium   Hospice care   Hospital Course   Denise [name] is a 87 y.o. female with a PMH significant for A. fib, CKD, COPD, type II DM, GERD, HTN, hypothyroidism. Pt presented from ALF to the ED on 09/14/2021 with altered mental status acutely.   She received chest compressions at her ALF which resulted in multiple fractured ribs.   EMS arrived and evaluated that she had a glucose level of 30 and provided her with an amp of D50.    In the ED, glucose was 78 and pt was minimally responsive.  Pt was treated with IV fluids and glucose, and admitted to medicine service for further workup and management of hypoglycemia and AMS as outlined below.  Once corrected metabolic acidosis, hypotension, and hypoglycemia, there was little improvement in patient's mental status. Goals of care discussions were had with family and palliative care team. It was decided to pursue comfort care measures for the patient on 09/20/21.  Patient is hemodynamically stable for transfer to residential hospice facility today.  ------------------------------------------------------------------------------------------------------------ ASSESSMENT/PLAN   GOC- per daughter request, patient placed on comfort care as of 11/28.   12/1 vitals remain stable without signs of imminent death at this time.   - palliative care was consulted - comfort measures per orders - notify provider if signs of pain, distress or discomfort despite ordered medications -  hospice referral for transfer to residential hospice given pt remains hemodynamically stable > 48 hours after transition to comfort care.     Hypoglycemic event- resolved.  Type II DM- comfort feeds   Sepsis ruled out - no source of infection identified.   Acute metabolic encephalopathy- diminished alertness Vascular shock- Criteria for shock have resolved.    HTN- discontinued antihypertensives   Atelectasis, pulmonary vascular congestion  h/o COPD-  - discontinue Abx and oxygen - morphine/ativan PRN air hunger / dyspnea   AKI on CKD- no longer monitoring labs on comfort care   Acute rib fractures, healing pelvic fracture- analgesia scheduled and PRN, lidocaine patch   Left forearm erythema- resolved. Continue to monitor   Afib- discontinued meds   Hypothyroidism- discontinued meds   Dementia  Anxiety  delirium-  treat agitation PRN   GERD- IV protonix PRN    Procedures performed: None   Condition at discharge: stable  Exam Physical Exam eyes open but unresponsive Respiratory: breathing unlabored, lungs with referred upper airway sounds Cardiovascular: 2+ radial pulses, RRR Neuro: pt unresponsive, non-verbal, does not follow commands GI: abdomen soft non-tender  Disposition: Hospice care  Discharge time: less than 30 minutes.   Allergies as of 09/23/2021       Reactions   Iodinated Diagnostic Agents Anaphylaxis, Other (See Comments), Rash   Benzocaine-chloroxylenol-hc    Years ago   Benzonatate Rash   Note: pruritis        Medication List     STOP taking these medications    allopurinol 100 MG tablet Commonly known as: ZYLOPRIM   cetirizine 5 MG  tablet Commonly known as: ZYRTEC   collagenase ointment Commonly known as: SANTYL   diltiazem 120 MG 24 hr capsule Commonly known as: CARDIZEM CD   diltiazem 240 MG 24 hr capsule Commonly known as: Cartia XT   furosemide 40 MG tablet Commonly known as: LASIX   insulin detemir 100 UNIT/ML  injection Commonly known as: LEVEMIR   insulin lispro 100 UNIT/ML injection Commonly known as: HUMALOG   levothyroxine 100 MCG tablet Commonly known as: SYNTHROID   LORazepam 0.5 MG tablet Commonly known as: ATIVAN Replaced by: LORazepam 2 MG/ML injection   metoprolol succinate 50 MG 24 hr tablet Commonly known as: TOPROL-XL   Multivitamin Adult Tabs   mupirocin cream 2 % Commonly known as: BACTROBAN   oxyCODONE-acetaminophen 5-325 MG tablet Commonly known as: PERCOCET/ROXICET   pantoprazole 20 MG tablet Commonly known as: PROTONIX   polyethylene glycol 17 g packet Commonly known as: MIRALAX / GLYCOLAX   senna 8.6 MG Tabs tablet Commonly known as: SENOKOT   simvastatin 40 MG tablet Commonly known as: ZOCOR   tiotropium 18 MCG inhalation capsule Commonly known as: SPIRIVA   valsartan 80 MG tablet Commonly known as: DIOVAN   vitamin C 500 MG tablet Commonly known as: ASCORBIC ACID   Vitamin D3 50 MCG (2000 UT) Tabs       TAKE these medications    fluticasone 50 MCG/ACT nasal spray Commonly known as: FLONASE Place 2 sprays into both nostrils daily. Start taking on: 10-18-2021   glycopyrrolate 0.2 MG/ML injection Commonly known as: ROBINUL Inject 0.5 mLs (0.1 mg total) into the vein 2 (two) times daily as needed (oral secretions).   haloperidol lactate 5 MG/ML injection Commonly known as: HALDOL Inject 0.4 mLs (2 mg total) into the muscle every 6 (six) hours as needed.   lidocaine 5 % Commonly known as: LIDODERM Place 1 patch onto the skin daily. Remove & Discard patch within 12 hours or as directed by MD   LORazepam 2 MG/ML injection Commonly known as: ATIVAN Inject 0.25 mLs (0.5 mg total) into the muscle every 4 (four) hours as needed for anxiety or sedation. Replaces: LORazepam 0.5 MG tablet   morphine 2 MG/ML injection Inject 1 mL (2 mg total) into the vein every 2 (two) hours as needed (or air hunger).   ondansetron 4 MG  tablet Commonly known as: ZOFRAN Take 1 tablet (4 mg total) by mouth every 6 (six) hours as needed for nausea.   QUEtiapine 25 MG tablet Commonly known as: SEROQUEL Take 3 tablets (75 mg total) by mouth at bedtime. What changed: Another medication with the same name was removed. Continue taking this medication, and follow the directions you see here.        CT ABDOMEN PELVIS WO CONTRAST  Result Date: 09/15/2021 CLINICAL DATA:  Left-sided abdominal pain, hypoglycemia, status post arrest EXAM: CT ABDOMEN AND PELVIS WITHOUT CONTRAST TECHNIQUE: Multidetector CT imaging of the abdomen and pelvis was performed following the standard protocol without IV contrast. Unenhanced CT was performed per clinician order. Lack of IV contrast limits sensitivity and specificity, especially for evaluation of abdominal/pelvic solid viscera. COMPARISON:  07/19/2021 FINDINGS: Lower chest: No acute pleural or parenchymal lung disease. Hepatobiliary: Gallbladder is surgically absent. Chronic dilation of the common bile duct likely related to prior cholecystectomy. No focal liver abnormalities. Pancreas: Unremarkable. No pancreatic ductal dilatation or surrounding inflammatory changes. Spleen: Normal in size without focal abnormality. Adrenals/Urinary Tract: No urinary tract calculi or obstructive uropathy. Right renal cortical cyst.  Bilateral renal cortical thinning. The adrenals and bladder are grossly unremarkable. Stomach/Bowel: No bowel obstruction or ileus. No bowel wall thickening or inflammatory change. Vascular/Lymphatic: Aortic atherosclerosis. No enlarged abdominal or pelvic lymph nodes. Reproductive: Uterus is atrophic.  No adnexal masses. Other: No free fluid or free intraperitoneal gas. No abdominal wall hernia. Musculoskeletal: Postsurgical changes from right hip arthroplasty. Subacute healing fractures are seen through the left iliac bone as well as the left superior and inferior pubic rami. Significant callus  formation since prior exam. Acute bilateral sixth rib fractures are noted. No other acute bony abnormalities. Reconstructed images demonstrate no additional findings. IMPRESSION: 1. Acute minimally displaced bilateral sixth rib fractures. 2. Subacute healing fractures through the left iliac bone, superior ramus, and inferior ramus. 3. Chronic dilation of the common bile duct related to prior cholecystectomy. 4. Otherwise no acute intra-abdominal or intrapelvic process. Electronically Signed   By: Randa Ngo M.D.   On: 09/15/2021 00:02   CT HEAD WO CONTRAST (5MM)  Result Date: 09/17/2021 CLINICAL DATA:  Altered mental status EXAM: CT HEAD WITHOUT CONTRAST TECHNIQUE: Contiguous axial images were obtained from the base of the skull through the vertex without intravenous contrast. COMPARISON:  09/14/2021 FINDINGS: Brain: No acute intracranial findings are seen. There is no demonstrable intracerebral hemorrhage. Ventricles are unremarkable. Cortical sulci are prominent. There is decreased density in subcortical and periventricular white matter. Minimal calcifications are seen in the basal ganglia. Vascular: Unremarkable. Skull: No fracture is seen. There is interval decrease in subcutaneous contusion/hematoma in the left frontal scalp Sinuses/Orbits: There is mild mucosal thickening in the ethmoid and right side of sphenoid sinuses. Other: None IMPRESSION: There are no signs of bleeding within the cranium. There is no focal mass effect. Atrophy. Small-vessel disease. Electronically Signed   By: Elmer Picker M.D.   On: 09/17/2021 11:13   CT Head Wo Contrast  Result Date: 09/14/2021 CLINICAL DATA:  87 year old post fall.  Hypoglycemia. EXAM: CT HEAD WITHOUT CONTRAST TECHNIQUE: Contiguous axial images were obtained from the base of the skull through the vertex without intravenous contrast. COMPARISON:  Head CT 07/19/2021 FINDINGS: Brain: Stable degree of atrophy and chronic small vessel ischemia. No  intracranial hemorrhage, mass effect, or midline shift. No hydrocephalus. The basilar cisterns are patent. No evidence of territorial infarct or acute ischemia. No extra-axial or intracranial fluid collection. Vascular: Atherosclerosis of skullbase vasculature without hyperdense vessel or abnormal calcification. Skull: No fracture or focal lesion. Sinuses/Orbits: Paranasal sinuses and mastoid air cells are clear. The visualized orbits are unremarkable. Other: Midline frontal scalp hematoma. IMPRESSION: 1. Midline frontal scalp hematoma. No acute intracranial abnormality. No skull fracture. 2. Stable atrophy and chronic small vessel ischemia. Electronically Signed   By: Keith Rake M.D.   On: 09/14/2021 22:38   CT Cervical Spine Wo Contrast  Result Date: 09/14/2021 CLINICAL DATA:  Unwitnessed fall.  Hypoglycemia. EXAM: CT CERVICAL SPINE WITHOUT CONTRAST TECHNIQUE: Multidetector CT imaging of the cervical spine was performed without intravenous contrast. Multiplanar CT image reconstructions were also generated. COMPARISON:  Cervical spine CT 07/19/2021 FINDINGS: Alignment: No broad-based leftward curvature, likely muscle spasm or positioning. Trace anterolisthesis of C2 on C3 and grade 1 anterolisthesis of C7 on T1 is unchanged. No traumatic subluxation. Skull base and vertebrae: No acute fracture. Vertebral body heights are maintained. The dens and skull base are intact. Soft tissues and spinal canal: No prevertebral fluid or swelling. No visible canal hematoma. Disc levels: Multilevel degenerative disc disease and facet hypertrophy. Stable degenerative change from prior. Upper  chest: Biapical pleuroparenchymal scarring. No acute findings. Other: None. IMPRESSION: Multilevel degenerative change in the cervical spine without acute fracture or subluxation. Electronically Signed   By: Keith Rake M.D.   On: 09/14/2021 22:42   DG Chest Port 1 View  Result Date: 09/18/2021 CLINICAL DATA:  They  presented from ALF to the ED on 09/14/2021 with altered mental status acutely. She received chest compressions at her ALF which resulted in multiple fractured ribs. EMS arrived and evaluated that she had a glucose level of 30 and provided her with an amp of D50. In the ED, it was found that they had a glucose of 78 and minimally responsive. EXAM: PORTABLE CHEST 1 VIEW COMPARISON:  09/14/2021 FINDINGS: Cardiac silhouette is normal in size. Stable left anterior chest wall dual lead pacemaker. No mediastinal or hilar masses. Lungs are hyperexpanded. There is opacity at both lung bases, left greater than right, on the left obscuring the hemidiaphragm. Lungs also show vascular congestion, but are otherwise clear. No pneumothorax.  Possible small effusions. No visualized fracture. Rib fractures noted on the prior abdomen and pelvis CT from 09/14/2021 are not visualized. IMPRESSION: 1. Left greater than right lung base opacities consistent with atelectasis with suspected small associated effusions. Pneumonia is not excluded. 2. Vascular congestion with no overt pulmonary edema. 3. No pneumothorax. Electronically Signed   By: Lajean Manes M.D.   On: 09/18/2021 12:00   DG Chest Portable 1 View  Result Date: 09/14/2021 CLINICAL DATA:  Altered mental status EXAM: PORTABLE CHEST 1 VIEW COMPARISON:  07/19/2021, 12/22/2019 FINDINGS: Left-sided pacing device as before. Cardiomegaly. Mild diffuse interstitial opacity likely due to chronic disease. No pleural effusion or pneumothorax. Aortic atherosclerosis. IMPRESSION: No active disease.  Mild cardiomegaly. Electronically Signed   By: Donavan Foil M.D.   On: 09/14/2021 21:50   EEG adult  Result Date: 09/17/2021 Lora Havens, MD     09/18/2021 10:03 AM Patient Name: MEDRITH VEILLON MRN: [id number] Epilepsy Attending: Lora Havens Referring Physician/Provider: Dr Lesleigh Noe Date: 09/17/2021 Duration: 21.12 mins Patient history: 87 year old female with altered  mental status.  EEG to evaluate for seizures. Level of alertness: Awake AEDs during EEG study: None Technical aspects: This EEG study was done with scalp electrodes positioned according to the 10-20 International system of electrode placement. Electrical activity was acquired at a sampling rate of 500Hz  and reviewed with a high frequency filter of 70Hz  and a low frequency filter of 1Hz . EEG data were recorded continuously and digitally stored. Description: EEG showed continuous generalized 3 to 6 Hz theta-delta slowing. Generalized  triphasic waves were also noted intermittently.  Hyperventilation and photic stimulation were not performed.   ABNORMALITY - Continuous slow, generalized - Triphasic waves, generalized IMPRESSION: This study is suggestive of moderate diffuse encephalopathy, nonspecific etiology but likely related to toxic-metabolic causes. No seizures or definite epileptiform discharges were seen throughout the recording. Priyanka Barbra Sarks   CUP PACEART INCLINIC DEVICE CHECK  Result Date: 09/09/2021 Pacemaker check in clinic. Normal device function. Thresholds, sensing, impedances consistent with previous measurements. Device programmed to maximize longevity. Device programmed at appropriate safety margins. Histogram distribution appropriate for patient activity level. Device programmed to optimize intrinsic conduction. Estimated longevity _3.9-5.9 years__. Patient enrolled in remote follow-up. Patient education completed. see office note for full details, PDF did not transfer  CUP [place]  Result Date: 09/03/2021 Scheduled remote reviewed. Normal device function.  First transmission since 5/9 Stored events for AF, one HVR 9/30 lasting 66min,  appears SVT Histogram poorly controlled.  AF currently in progress from 11/10 @ 19:41, burden <1% ? Eliquis, no medications on MAR Next remote 91 days. Route to triage LR  Results for orders placed or performed during the hospital  encounter of 09/14/21  Blood culture (routine x 2)     Status: None   Collection Time: 09/14/21  9:37 PM   Specimen: Right Antecubital; Blood  Result Value Ref Range Status   Specimen Description RIGHT ANTECUBITAL  Final   Special Requests   Final    BOTTLES DRAWN AEROBIC AND ANAEROBIC Blood Culture adequate volume   Culture   Final    NO GROWTH 5 DAYS Performed at [facility], [address] [place], [place] [address]    Report Status 09/19/2021 FINAL  Final  Blood culture (routine x 2)     Status: None   Collection Time: 09/14/21  9:42 PM   Specimen: BLOOD LEFT HAND  Result Value Ref Range Status   Specimen Description BLOOD LEFT HAND  Final   Special Requests   Final    BOTTLES DRAWN AEROBIC AND ANAEROBIC Blood Culture adequate volume   Culture   Final    NO GROWTH 5 DAYS Performed at [facility], [address] [place], [place] [address]    Report Status 09/19/2021 FINAL  Final  Resp Panel by RT-PCR (Flu A&B, Covid) Nasopharyngeal Swab     Status: None   Collection Time: 09/14/21 10:46 PM   Specimen: Nasopharyngeal Swab; Nasopharyngeal(NP) swabs in vial transport medium  Result Value Ref Range Status   SARS Coronavirus 2 by RT PCR NEGATIVE NEGATIVE Final    Comment: (NOTE) SARS-CoV-2 target nucleic acids are NOT DETECTED.  The SARS-CoV-2 RNA is generally detectable in upper respiratory specimens during the acute phase of infection. The lowest concentration of SARS-CoV-2 viral copies this assay can detect is 138 copies/mL. A negative result does not preclude SARS-Cov-2 infection and should not be used as the sole basis for treatment or other patient management decisions. A negative result may occur with  improper specimen collection/handling, submission of specimen other than nasopharyngeal swab, presence of viral mutation(s) within the areas targeted by this assay, and inadequate number of viral copies(<138 copies/mL). A negative result must be  combined with clinical observations, patient history, and epidemiological information. The expected result is Negative.  Fact Sheet for Patients:  EntrepreneurPulse.com.au  Fact Sheet for Healthcare Providers:  IncredibleEmployment.be  This test is no t yet approved or cleared by the [place] FDA and  has been authorized for detection and/or diagnosis of SARS-CoV-2 by FDA under an Emergency Use Authorization (EUA). This EUA will remain  in effect (meaning this test can be used) for the duration of the COVID-19 declaration under Section 564(b)(1) of the Act, 21 U.S.C.section 360bbb-3(b)(1), unless the authorization is terminated  or revoked sooner.       Influenza A by PCR NEGATIVE NEGATIVE Final   Influenza B by PCR NEGATIVE NEGATIVE Final    Comment: (NOTE) The Xpert Xpress SARS-CoV-2/FLU/RSV plus assay is intended as an aid in the diagnosis of influenza from Nasopharyngeal swab specimens and should not be used as a sole basis for treatment. Nasal washings and aspirates are unacceptable for Xpert Xpress SARS-CoV-2/FLU/RSV testing.  Fact Sheet for Patients: EntrepreneurPulse.com.au  Fact Sheet for Healthcare Providers: IncredibleEmployment.be  This test is not yet approved or cleared by the [place] FDA and has been authorized for detection and/or diagnosis of SARS-CoV-2 by FDA under  an Emergency Use Authorization (EUA). This EUA will remain in effect (meaning this test can be used) for the duration of the COVID-19 declaration under Section 564(b)(1) of the Act, 21 U.S.C. section 360bbb-3(b)(1), unless the authorization is terminated or revoked.  Performed at [facility], [place], [place] [address]   Respiratory (~20 pathogens) panel by PCR     Status: None   Collection Time: 09/15/21  6:11 PM   Specimen: Nasopharyngeal Swab; Respiratory  Result Value Ref Range  Status   Adenovirus NOT DETECTED NOT DETECTED Final   Coronavirus 229E NOT DETECTED NOT DETECTED Final    Comment: (NOTE) The Coronavirus on the Respiratory Panel, DOES NOT test for the novel  Coronavirus (2019 nCoV)    Coronavirus HKU1 NOT DETECTED NOT DETECTED Final   Coronavirus NL63 NOT DETECTED NOT DETECTED Final   Coronavirus OC43 NOT DETECTED NOT DETECTED Final   Metapneumovirus NOT DETECTED NOT DETECTED Final   Rhinovirus / Enterovirus NOT DETECTED NOT DETECTED Final   Influenza A NOT DETECTED NOT DETECTED Final   Influenza B NOT DETECTED NOT DETECTED Final   Parainfluenza Virus 1 NOT DETECTED NOT DETECTED Final   Parainfluenza Virus 2 NOT DETECTED NOT DETECTED Final   Parainfluenza Virus 3 NOT DETECTED NOT DETECTED Final   Parainfluenza Virus 4 NOT DETECTED NOT DETECTED Final   Respiratory Syncytial Virus NOT DETECTED NOT DETECTED Final   Bordetella pertussis NOT DETECTED NOT DETECTED Final   Bordetella Parapertussis NOT DETECTED NOT DETECTED Final   Chlamydophila pneumoniae NOT DETECTED NOT DETECTED Final   Mycoplasma pneumoniae NOT DETECTED NOT DETECTED Final    Comment: Performed at [facility], [place]. [address] [place], [place] [address]    Signed:  Ezekiel Slocumb MD.  [facility] 09/23/2021, 12:58 PM

## 2021-09-23 NOTE — Progress Notes (Signed)
Report given to Hospice RN who will receive patient after 5pm this evening. All questions answered, Ivs to remain in place as she travels to Hospice.

## 2021-09-23 NOTE — TOC Transition Note (Addendum)
Transition of Care [facility]) - CM/SW Discharge Note   Patient Details  Name: Denise [name] MRN: [id number] Date of Birth: 19-Aug-1930  [facility] CM/SW Contact:  Eileen Stanford, LCSW Phone Number: 09/23/2021, 1:09 PM   Clinical Narrative:   Clinical Social Worker facilitated patient discharge including contacting patient family and facility to confirm patient discharge plans.  Clinical information faxed to facility and family agreeable with plan.  CSW arranged ambulance transport via [facility] to [place] (5:00PM).  RN to call for report prior to discharge.       Final next level of care: [place] Barriers to Discharge: No Barriers Identified   Patient Goals and CMS Choice Patient states their goals for this hospitalization and ongoing recovery are:: TBD CMS Medicare.gov Compare Post Acute Care list provided to:: Patient Represenative (must comment) Choice offered to / list presented to : Adult Children  Discharge Placement              Patient chooses bed at:  ([facility]) Patient to be transferred to facility by: [facility]   Patient and family notified of of transfer: 09/23/21  Discharge Plan and Services                                     Social Determinants of Health (SDOH) Interventions     Readmission Risk Interventions Readmission Risk Prevention Plan 09/18/2021  Transportation Screening Complete  PCP or Specialist Appt within 3-5 Days Complete  HRI or [place] Complete  Social Work Consult for [place] Planning/Counseling Complete  Palliative Care Screening Complete  Medication Review Press photographer) Complete  Some recent data might be hidden

## 2021-09-23 NOTE — Progress Notes (Signed)
Pt being transported via EMS to Hospice, vital signs taken, PRN pain meds given, Ivs are in place as requested

## 2021-10-24 DEATH — deceased

## 2023-10-25 DEATH — deceased
